# Patient Record
Sex: Female | Born: 1935 | Race: Black or African American | Hispanic: No | Marital: Married | Smoking: Never smoker
Health system: Southern US, Community
[De-identification: ages and names within clinical notes are randomized; demographics above are authoritative.]

## PROBLEM LIST (undated history)

## (undated) DIAGNOSIS — I10 Essential (primary) hypertension: Secondary | ICD-10-CM

## (undated) DIAGNOSIS — F329 Major depressive disorder, single episode, unspecified: Secondary | ICD-10-CM

## (undated) DIAGNOSIS — K5790 Diverticulosis of intestine, part unspecified, without perforation or abscess without bleeding: Secondary | ICD-10-CM

## (undated) DIAGNOSIS — N183 Chronic kidney disease, stage 3 unspecified: Secondary | ICD-10-CM

## (undated) DIAGNOSIS — F32A Depression, unspecified: Secondary | ICD-10-CM

## (undated) DIAGNOSIS — I639 Cerebral infarction, unspecified: Secondary | ICD-10-CM

## (undated) DIAGNOSIS — E119 Type 2 diabetes mellitus without complications: Secondary | ICD-10-CM

## (undated) DIAGNOSIS — M1990 Unspecified osteoarthritis, unspecified site: Secondary | ICD-10-CM

## (undated) DIAGNOSIS — K648 Other hemorrhoids: Secondary | ICD-10-CM

## (undated) DIAGNOSIS — Z86010 Personal history of colonic polyps: Secondary | ICD-10-CM

## (undated) DIAGNOSIS — D509 Iron deficiency anemia, unspecified: Secondary | ICD-10-CM

## (undated) DIAGNOSIS — I5032 Chronic diastolic (congestive) heart failure: Secondary | ICD-10-CM

## (undated) DIAGNOSIS — Z860101 Personal history of adenomatous and serrated colon polyps: Secondary | ICD-10-CM

## (undated) DIAGNOSIS — F0390 Unspecified dementia without behavioral disturbance: Secondary | ICD-10-CM

## (undated) DIAGNOSIS — E785 Hyperlipidemia, unspecified: Secondary | ICD-10-CM

## (undated) HISTORY — DX: Chronic kidney disease, stage 3 (moderate): N18.3

## (undated) HISTORY — DX: Chronic kidney disease, stage 3 unspecified: N18.30

## (undated) HISTORY — PX: VASCULAR SURGERY: SHX849

## (undated) HISTORY — DX: Personal history of colonic polyps: Z86.010

## (undated) HISTORY — PX: TONSILLECTOMY: SUR1361

## (undated) HISTORY — DX: Other hemorrhoids: K64.8

## (undated) HISTORY — DX: Personal history of adenomatous and serrated colon polyps: Z86.0101

## (undated) HISTORY — DX: Iron deficiency anemia, unspecified: D50.9

## (undated) HISTORY — PX: ABDOMINAL SURGERY: SHX537

## (undated) HISTORY — DX: Hyperlipidemia, unspecified: E78.5

## (undated) HISTORY — PX: ABDOMINAL HYSTERECTOMY: SHX81

## (undated) HISTORY — PX: COLECTOMY: SHX59

## (undated) HISTORY — PX: OTHER SURGICAL HISTORY: SHX169

## (undated) HISTORY — PX: BOWEL RESECTION: SHX1257

## (undated) HISTORY — DX: Diverticulosis of intestine, part unspecified, without perforation or abscess without bleeding: K57.90

---

## 1997-06-08 ENCOUNTER — Emergency Department (HOSPITAL_COMMUNITY): Admission: EM | Admit: 1997-06-08 | Discharge: 1997-06-08 | Payer: Self-pay | Admitting: *Deleted

## 1998-02-01 ENCOUNTER — Emergency Department (HOSPITAL_COMMUNITY): Admission: EM | Admit: 1998-02-01 | Discharge: 1998-02-01 | Payer: Self-pay | Admitting: Emergency Medicine

## 1998-07-06 ENCOUNTER — Emergency Department (HOSPITAL_COMMUNITY): Admission: EM | Admit: 1998-07-06 | Discharge: 1998-07-06 | Payer: Self-pay | Admitting: Emergency Medicine

## 1998-07-08 ENCOUNTER — Inpatient Hospital Stay (HOSPITAL_COMMUNITY): Admission: EM | Admit: 1998-07-08 | Discharge: 1998-07-12 | Payer: Self-pay | Admitting: Emergency Medicine

## 1998-07-08 ENCOUNTER — Encounter: Payer: Self-pay | Admitting: Internal Medicine

## 1998-07-09 ENCOUNTER — Encounter (INDEPENDENT_AMBULATORY_CARE_PROVIDER_SITE_OTHER): Payer: Self-pay | Admitting: Specialist

## 1999-01-21 ENCOUNTER — Encounter: Payer: Self-pay | Admitting: Emergency Medicine

## 1999-01-21 ENCOUNTER — Emergency Department (HOSPITAL_COMMUNITY): Admission: EM | Admit: 1999-01-21 | Discharge: 1999-01-21 | Payer: Self-pay | Admitting: Emergency Medicine

## 2000-03-03 ENCOUNTER — Encounter: Payer: Self-pay | Admitting: Internal Medicine

## 2000-03-03 ENCOUNTER — Inpatient Hospital Stay (HOSPITAL_COMMUNITY): Admission: EM | Admit: 2000-03-03 | Discharge: 2000-03-05 | Payer: Self-pay | Admitting: Emergency Medicine

## 2000-05-03 ENCOUNTER — Emergency Department (HOSPITAL_COMMUNITY): Admission: EM | Admit: 2000-05-03 | Discharge: 2000-05-03 | Payer: Self-pay | Admitting: Emergency Medicine

## 2000-05-08 ENCOUNTER — Encounter (INDEPENDENT_AMBULATORY_CARE_PROVIDER_SITE_OTHER): Payer: Self-pay | Admitting: Specialist

## 2000-05-08 ENCOUNTER — Other Ambulatory Visit: Admission: RE | Admit: 2000-05-08 | Discharge: 2000-05-08 | Payer: Self-pay | Admitting: Internal Medicine

## 2001-05-11 ENCOUNTER — Emergency Department (HOSPITAL_COMMUNITY): Admission: EM | Admit: 2001-05-11 | Discharge: 2001-05-11 | Payer: Self-pay | Admitting: Emergency Medicine

## 2002-06-05 ENCOUNTER — Emergency Department (HOSPITAL_COMMUNITY): Admission: EM | Admit: 2002-06-05 | Discharge: 2002-06-05 | Payer: Self-pay | Admitting: Emergency Medicine

## 2002-10-29 ENCOUNTER — Encounter: Payer: Self-pay | Admitting: Internal Medicine

## 2002-10-29 ENCOUNTER — Inpatient Hospital Stay (HOSPITAL_COMMUNITY): Admission: EM | Admit: 2002-10-29 | Discharge: 2002-10-30 | Payer: Self-pay

## 2002-10-29 ENCOUNTER — Encounter: Payer: Self-pay | Admitting: Emergency Medicine

## 2002-11-03 ENCOUNTER — Encounter: Admission: RE | Admit: 2002-11-03 | Discharge: 2002-11-03 | Payer: Self-pay | Admitting: Internal Medicine

## 2002-11-14 ENCOUNTER — Encounter: Admission: RE | Admit: 2002-11-14 | Discharge: 2002-11-14 | Payer: Self-pay | Admitting: Internal Medicine

## 2002-12-09 ENCOUNTER — Encounter: Admission: RE | Admit: 2002-12-09 | Discharge: 2002-12-09 | Payer: Self-pay | Admitting: Internal Medicine

## 2003-07-03 ENCOUNTER — Inpatient Hospital Stay (HOSPITAL_COMMUNITY): Admission: EM | Admit: 2003-07-03 | Discharge: 2003-07-04 | Payer: Self-pay | Admitting: Emergency Medicine

## 2003-07-20 ENCOUNTER — Ambulatory Visit (HOSPITAL_COMMUNITY): Admission: RE | Admit: 2003-07-20 | Discharge: 2003-07-20 | Payer: Self-pay | Admitting: Cardiology

## 2003-07-20 ENCOUNTER — Encounter (INDEPENDENT_AMBULATORY_CARE_PROVIDER_SITE_OTHER): Payer: Self-pay | Admitting: *Deleted

## 2003-10-15 ENCOUNTER — Ambulatory Visit: Payer: Self-pay | Admitting: Internal Medicine

## 2003-10-22 ENCOUNTER — Ambulatory Visit: Payer: Self-pay | Admitting: Internal Medicine

## 2003-10-25 ENCOUNTER — Inpatient Hospital Stay (HOSPITAL_COMMUNITY): Admission: EM | Admit: 2003-10-25 | Discharge: 2003-10-29 | Payer: Self-pay | Admitting: Emergency Medicine

## 2003-10-27 ENCOUNTER — Encounter: Payer: Self-pay | Admitting: Internal Medicine

## 2003-11-02 ENCOUNTER — Ambulatory Visit: Payer: Self-pay | Admitting: Internal Medicine

## 2003-11-13 ENCOUNTER — Encounter (INDEPENDENT_AMBULATORY_CARE_PROVIDER_SITE_OTHER): Payer: Self-pay | Admitting: *Deleted

## 2003-12-10 ENCOUNTER — Emergency Department (HOSPITAL_COMMUNITY): Admission: EM | Admit: 2003-12-10 | Discharge: 2003-12-10 | Payer: Self-pay | Admitting: Family Medicine

## 2003-12-12 ENCOUNTER — Emergency Department (HOSPITAL_COMMUNITY): Admission: EM | Admit: 2003-12-12 | Discharge: 2003-12-12 | Payer: Self-pay | Admitting: Family Medicine

## 2004-07-21 ENCOUNTER — Ambulatory Visit: Payer: Self-pay | Admitting: Family Medicine

## 2004-08-18 ENCOUNTER — Emergency Department (HOSPITAL_COMMUNITY): Admission: EM | Admit: 2004-08-18 | Discharge: 2004-08-18 | Payer: Self-pay | Admitting: Emergency Medicine

## 2004-11-30 ENCOUNTER — Emergency Department (HOSPITAL_COMMUNITY): Admission: EM | Admit: 2004-11-30 | Discharge: 2004-11-30 | Payer: Self-pay | Admitting: Emergency Medicine

## 2005-01-12 ENCOUNTER — Ambulatory Visit: Payer: Self-pay | Admitting: Family Medicine

## 2005-02-01 ENCOUNTER — Encounter: Admission: RE | Admit: 2005-02-01 | Discharge: 2005-02-01 | Payer: Self-pay | Admitting: Family Medicine

## 2005-03-15 ENCOUNTER — Ambulatory Visit: Payer: Self-pay | Admitting: Internal Medicine

## 2005-04-12 ENCOUNTER — Ambulatory Visit: Payer: Self-pay | Admitting: Family Medicine

## 2005-08-16 ENCOUNTER — Ambulatory Visit: Payer: Self-pay | Admitting: Family Medicine

## 2005-08-30 ENCOUNTER — Ambulatory Visit: Payer: Self-pay | Admitting: Family Medicine

## 2005-09-06 ENCOUNTER — Ambulatory Visit: Payer: Self-pay | Admitting: Family Medicine

## 2006-01-13 ENCOUNTER — Emergency Department (HOSPITAL_COMMUNITY): Admission: EM | Admit: 2006-01-13 | Discharge: 2006-01-13 | Payer: Self-pay | Admitting: Emergency Medicine

## 2006-03-27 ENCOUNTER — Ambulatory Visit: Payer: Self-pay | Admitting: Family Medicine

## 2006-04-03 ENCOUNTER — Ambulatory Visit: Payer: Self-pay | Admitting: Internal Medicine

## 2006-04-03 LAB — CONVERTED CEMR LAB
Eosinophils Absolute: 0.1 10*3/uL (ref 0.0–0.6)
Eosinophils Relative: 0.8 % (ref 0.0–5.0)
HCT: 33.4 % — ABNORMAL LOW (ref 36.0–46.0)
Hemoglobin: 11.2 g/dL — ABNORMAL LOW (ref 12.0–15.0)
MCV: 83.3 fL (ref 78.0–100.0)
Monocytes Absolute: 0.6 10*3/uL (ref 0.2–0.7)
Neutrophils Relative %: 42.9 % — ABNORMAL LOW (ref 43.0–77.0)
RBC: 4.02 M/uL (ref 3.87–5.11)
Saturation Ratios: 11.9 % — ABNORMAL LOW (ref 20.0–50.0)
WBC: 7.8 10*3/uL (ref 4.5–10.5)

## 2006-04-10 ENCOUNTER — Ambulatory Visit: Payer: Self-pay | Admitting: Internal Medicine

## 2006-04-10 LAB — CONVERTED CEMR LAB
OCCULT 1: NEGATIVE
OCCULT 3: NEGATIVE
OCCULT 4: NEGATIVE

## 2006-04-14 ENCOUNTER — Encounter (INDEPENDENT_AMBULATORY_CARE_PROVIDER_SITE_OTHER): Payer: Self-pay | Admitting: *Deleted

## 2006-04-15 ENCOUNTER — Inpatient Hospital Stay (HOSPITAL_COMMUNITY): Admission: EM | Admit: 2006-04-15 | Discharge: 2006-04-19 | Payer: Self-pay | Admitting: Emergency Medicine

## 2006-04-15 ENCOUNTER — Encounter (INDEPENDENT_AMBULATORY_CARE_PROVIDER_SITE_OTHER): Payer: Self-pay | Admitting: *Deleted

## 2006-04-19 ENCOUNTER — Encounter (INDEPENDENT_AMBULATORY_CARE_PROVIDER_SITE_OTHER): Payer: Self-pay | Admitting: *Deleted

## 2006-05-14 ENCOUNTER — Encounter: Payer: Self-pay | Admitting: Internal Medicine

## 2006-05-15 ENCOUNTER — Ambulatory Visit: Payer: Self-pay | Admitting: Internal Medicine

## 2006-06-13 ENCOUNTER — Ambulatory Visit: Payer: Self-pay | Admitting: Internal Medicine

## 2006-06-20 ENCOUNTER — Ambulatory Visit (HOSPITAL_COMMUNITY): Admission: RE | Admit: 2006-06-20 | Discharge: 2006-06-20 | Payer: Self-pay | Admitting: Internal Medicine

## 2006-10-14 ENCOUNTER — Emergency Department (HOSPITAL_COMMUNITY): Admission: EM | Admit: 2006-10-14 | Discharge: 2006-10-14 | Payer: Self-pay | Admitting: Emergency Medicine

## 2007-05-24 DIAGNOSIS — I639 Cerebral infarction, unspecified: Secondary | ICD-10-CM

## 2007-05-24 HISTORY — DX: Cerebral infarction, unspecified: I63.9

## 2007-06-01 ENCOUNTER — Inpatient Hospital Stay (HOSPITAL_COMMUNITY): Admission: EM | Admit: 2007-06-01 | Discharge: 2007-06-05 | Payer: Self-pay | Admitting: Emergency Medicine

## 2007-06-03 ENCOUNTER — Encounter (INDEPENDENT_AMBULATORY_CARE_PROVIDER_SITE_OTHER): Payer: Self-pay | Admitting: Family Medicine

## 2007-06-03 ENCOUNTER — Ambulatory Visit: Payer: Self-pay | Admitting: Vascular Surgery

## 2007-06-05 ENCOUNTER — Encounter (INDEPENDENT_AMBULATORY_CARE_PROVIDER_SITE_OTHER): Payer: Self-pay | Admitting: *Deleted

## 2007-07-17 ENCOUNTER — Encounter: Admission: RE | Admit: 2007-07-17 | Discharge: 2007-10-15 | Payer: Self-pay | Admitting: Family Medicine

## 2007-08-21 ENCOUNTER — Emergency Department (HOSPITAL_COMMUNITY): Admission: EM | Admit: 2007-08-21 | Discharge: 2007-08-21 | Payer: Self-pay | Admitting: Emergency Medicine

## 2007-08-21 ENCOUNTER — Telehealth: Payer: Self-pay | Admitting: Internal Medicine

## 2007-08-22 ENCOUNTER — Ambulatory Visit: Payer: Self-pay | Admitting: Internal Medicine

## 2007-08-23 LAB — CONVERTED CEMR LAB
Basophils Absolute: 0 10*3/uL (ref 0.0–0.1)
Eosinophils Absolute: 0.1 10*3/uL (ref 0.0–0.7)
MCHC: 34.3 g/dL (ref 30.0–36.0)
MCV: 83.2 fL (ref 78.0–100.0)
Neutrophils Relative %: 54 % (ref 43.0–77.0)
Platelets: 246 10*3/uL (ref 150–400)
WBC: 7.1 10*3/uL (ref 4.5–10.5)

## 2007-10-15 ENCOUNTER — Emergency Department (HOSPITAL_COMMUNITY): Admission: EM | Admit: 2007-10-15 | Discharge: 2007-10-15 | Payer: Self-pay | Admitting: Emergency Medicine

## 2008-01-24 HISTORY — PX: SUBTOTAL COLECTOMY: SHX855

## 2008-03-15 ENCOUNTER — Ambulatory Visit: Payer: Self-pay | Admitting: Cardiovascular Disease

## 2008-03-15 ENCOUNTER — Inpatient Hospital Stay (HOSPITAL_COMMUNITY): Admission: EM | Admit: 2008-03-15 | Discharge: 2008-03-18 | Payer: Self-pay | Admitting: Emergency Medicine

## 2008-03-16 ENCOUNTER — Encounter (INDEPENDENT_AMBULATORY_CARE_PROVIDER_SITE_OTHER): Payer: Self-pay | Admitting: Family Medicine

## 2008-03-16 ENCOUNTER — Ambulatory Visit: Payer: Self-pay | Admitting: Surgery

## 2008-03-17 ENCOUNTER — Encounter (INDEPENDENT_AMBULATORY_CARE_PROVIDER_SITE_OTHER): Payer: Self-pay | Admitting: Neurology

## 2008-06-23 ENCOUNTER — Emergency Department (HOSPITAL_COMMUNITY): Admission: EM | Admit: 2008-06-23 | Discharge: 2008-06-23 | Payer: Self-pay | Admitting: Emergency Medicine

## 2008-08-17 ENCOUNTER — Ambulatory Visit: Payer: Self-pay | Admitting: Internal Medicine

## 2008-08-17 ENCOUNTER — Telehealth: Payer: Self-pay | Admitting: Internal Medicine

## 2008-08-17 DIAGNOSIS — R7309 Other abnormal glucose: Secondary | ICD-10-CM

## 2008-08-17 DIAGNOSIS — D509 Iron deficiency anemia, unspecified: Secondary | ICD-10-CM

## 2008-08-17 DIAGNOSIS — I10 Essential (primary) hypertension: Secondary | ICD-10-CM | POA: Insufficient documentation

## 2008-08-17 DIAGNOSIS — K5732 Diverticulitis of large intestine without perforation or abscess without bleeding: Secondary | ICD-10-CM | POA: Insufficient documentation

## 2008-08-17 DIAGNOSIS — E785 Hyperlipidemia, unspecified: Secondary | ICD-10-CM | POA: Insufficient documentation

## 2008-08-17 DIAGNOSIS — Z8673 Personal history of transient ischemic attack (TIA), and cerebral infarction without residual deficits: Secondary | ICD-10-CM

## 2008-08-17 LAB — CONVERTED CEMR LAB
ALT: 9 units/L (ref 0–35)
AST: 13 units/L (ref 0–37)
Albumin: 2.6 g/dL — ABNORMAL LOW (ref 3.5–5.2)
Basophils Absolute: 0 10*3/uL (ref 0.0–0.1)
Calcium: 8.1 mg/dL — ABNORMAL LOW (ref 8.4–10.5)
Chloride: 109 meq/L (ref 96–112)
Eosinophils Absolute: 0 10*3/uL (ref 0.0–0.7)
Hemoglobin: 11.2 g/dL — ABNORMAL LOW (ref 12.0–15.0)
Lymphocytes Relative: 31.5 % (ref 12.0–46.0)
MCHC: 32.3 g/dL (ref 30.0–36.0)
Neutro Abs: 4.2 10*3/uL (ref 1.4–7.7)
Neutrophils Relative %: 60.5 % (ref 43.0–77.0)
Potassium: 4.1 meq/L (ref 3.5–5.1)
RDW: 13.7 % (ref 11.5–14.6)

## 2008-08-18 ENCOUNTER — Ambulatory Visit: Payer: Self-pay | Admitting: Internal Medicine

## 2008-08-24 ENCOUNTER — Encounter: Payer: Self-pay | Admitting: Internal Medicine

## 2008-08-25 ENCOUNTER — Encounter: Payer: Self-pay | Admitting: Internal Medicine

## 2008-08-25 DIAGNOSIS — J45909 Unspecified asthma, uncomplicated: Secondary | ICD-10-CM | POA: Insufficient documentation

## 2008-08-25 DIAGNOSIS — Z860100 Personal history of colon polyps, unspecified: Secondary | ICD-10-CM | POA: Insufficient documentation

## 2008-09-07 ENCOUNTER — Ambulatory Visit: Payer: Self-pay | Admitting: Internal Medicine

## 2008-09-17 ENCOUNTER — Encounter: Payer: Self-pay | Admitting: Internal Medicine

## 2008-10-22 ENCOUNTER — Encounter (INDEPENDENT_AMBULATORY_CARE_PROVIDER_SITE_OTHER): Payer: Self-pay | Admitting: General Surgery

## 2008-10-22 ENCOUNTER — Inpatient Hospital Stay (HOSPITAL_COMMUNITY): Admission: RE | Admit: 2008-10-22 | Discharge: 2008-10-28 | Payer: Self-pay | Admitting: General Surgery

## 2008-11-11 ENCOUNTER — Encounter: Payer: Self-pay | Admitting: Internal Medicine

## 2008-11-22 ENCOUNTER — Ambulatory Visit: Payer: Self-pay | Admitting: Internal Medicine

## 2008-11-22 ENCOUNTER — Inpatient Hospital Stay (HOSPITAL_COMMUNITY): Admission: EM | Admit: 2008-11-22 | Discharge: 2008-11-27 | Payer: Self-pay | Admitting: Emergency Medicine

## 2008-11-23 ENCOUNTER — Ambulatory Visit: Payer: Self-pay | Admitting: Surgery

## 2008-11-23 ENCOUNTER — Encounter (INDEPENDENT_AMBULATORY_CARE_PROVIDER_SITE_OTHER): Payer: Self-pay | Admitting: Internal Medicine

## 2008-11-26 ENCOUNTER — Encounter: Payer: Self-pay | Admitting: Internal Medicine

## 2008-11-27 ENCOUNTER — Encounter (INDEPENDENT_AMBULATORY_CARE_PROVIDER_SITE_OTHER): Payer: Self-pay | Admitting: *Deleted

## 2008-12-03 ENCOUNTER — Encounter: Payer: Self-pay | Admitting: Internal Medicine

## 2009-02-04 ENCOUNTER — Encounter: Payer: Self-pay | Admitting: Internal Medicine

## 2009-06-11 ENCOUNTER — Encounter (INDEPENDENT_AMBULATORY_CARE_PROVIDER_SITE_OTHER): Payer: Self-pay | Admitting: *Deleted

## 2009-06-11 ENCOUNTER — Emergency Department (HOSPITAL_COMMUNITY): Admission: EM | Admit: 2009-06-11 | Discharge: 2009-06-11 | Payer: Self-pay | Admitting: Emergency Medicine

## 2009-09-05 ENCOUNTER — Emergency Department (HOSPITAL_COMMUNITY): Admission: EM | Admit: 2009-09-05 | Discharge: 2009-09-05 | Payer: Self-pay | Admitting: Emergency Medicine

## 2009-09-05 ENCOUNTER — Encounter (INDEPENDENT_AMBULATORY_CARE_PROVIDER_SITE_OTHER): Payer: Self-pay | Admitting: *Deleted

## 2009-10-05 ENCOUNTER — Emergency Department (HOSPITAL_COMMUNITY): Admission: EM | Admit: 2009-10-05 | Discharge: 2009-10-05 | Payer: Self-pay | Admitting: Emergency Medicine

## 2009-10-05 ENCOUNTER — Encounter (INDEPENDENT_AMBULATORY_CARE_PROVIDER_SITE_OTHER): Payer: Self-pay | Admitting: *Deleted

## 2010-01-06 ENCOUNTER — Ambulatory Visit: Payer: Self-pay | Admitting: Internal Medicine

## 2010-01-17 ENCOUNTER — Emergency Department (HOSPITAL_COMMUNITY)
Admission: EM | Admit: 2010-01-17 | Discharge: 2010-01-17 | Payer: Self-pay | Source: Home / Self Care | Admitting: Emergency Medicine

## 2010-01-19 ENCOUNTER — Emergency Department (HOSPITAL_COMMUNITY)
Admission: EM | Admit: 2010-01-19 | Discharge: 2010-01-19 | Payer: Self-pay | Source: Home / Self Care | Admitting: Emergency Medicine

## 2010-02-22 NOTE — Letter (Signed)
Summary: [facility]   Imported By: Lester Tucker 03/08/2009 09:25:39  _____________________________________________________________________  External Attachment:    Type:   Image     Comment:   External Document

## 2010-02-24 NOTE — Consult Note (Signed)
Summary: Pandiverticulosis  NAMEJOURNIE, HOWSON              ACCOUNT NO.:  [id number]      MEDICAL RECORD NO.:  [id number]          PATIENT TYPE:  INP      LOCATION:  4739                         FACILITY:  [facility]      PHYSICIAN:  Michael L. Reynolds, M.D.DATE OF BIRTH:  07-12-1935      DATE OF CONSULTATION:  11/27/2008   DATE OF DISCHARGE:  11/27/2008                                    CONSULTATION      HISTORY OF PRESENT ILLNESS:  This is a 75 year old with past medical   history significant for hypertension, pandiverticulitis, status post   total colectomy on October 22, 2008, history of CVA in 2010, with CVA   that presented with a slurred speech, a prior history of TIA in 2009,   history of hypertension who presented on November 22, 2008, to the   emergency department after a near syncope event.  The patient relayed   that she was going to stand in her dining room when she developed   lightheadedness and accompanied by dyspnea.  The patient was found to   have a pulmonary embolism bilateral.  The patient also had a neuro   workup for her near syncope event with the result as below.  During this   interview, the patient relayed feeling much better.  Denies any   lightheadedness.  No new neuro deficit.  She has some dysarthria and   slurred speech that is pretty much the same.  She is slightly aphasic   also.  The patient denied any prior history of DVT or pulmonary   embolism, no family history of prior clot.  Her shortness of breath has   improved.      PAST MEDICAL HISTORY:  Hypertension   Pandiverticulosis with the history of recurrent diverticulitis   History of hyperlipidemia   History of asthma   CVA in 2010, TIA in 2009   History of cerebral artery occlusion   History of anemia.      PAST SURGICAL HISTORY:  Total abdominal colectomy on October 22, 2008,   history of TAH/BSO, and right ear mastoidectomy.      OUTPATIENT MEDICATION:  Plavix   Cardizem.      INPATIENT MEDICATION:  Norvasc, ceftriaxone, Plavix, diltiazem,   fondaparinux, iodine, warfarin, and Tylenol.      ALLERGIES:  Prior history of allergy to ASPIRIN.      FAMILY HISTORY:  Paternal side has history of CVA, diabetes, coronary   artery disease.  Her father died of a stroke.  No prior history of clot   on her family.      SOCIAL HISTORY:  She is single, she has 2 daughters, 2 son.  She denies   tobacco, alcohol, or illicit drugs.      REVIEW OF SYSTEMS:  All review of systems are negative except as per   HPI.      PHYSICAL EXAMINATION:  VITAL SIGNS:  Temperature 97.9, blood pressure   144/64, heart rate 76, respirations 20, and sat 100% on room air.   HEENT:  Head, atraumatic and normocephalic.  No dentition.  Right eye   with mild proptosis.  Uvula central.   LUNGS:  Clear to auscultation.  No wheezing, no rales, no rhonchi.   ABDOMEN:  Bowel sounds positive.  Soft.  Incision in midline abdomen   with a small opening; mild drainage of blood, very mild.  No sign of   infection.   NECK:  Supple.  No carotid bruits.  No thyromegaly.   CARDIOVASCULAR:  S1 and S2.  Normal regular rhythm and rate.  Systolic   murmur best heard at the second intercostal space.   SKIN:  Dry skin.  No rashes.   NEURO:  The patient alert and oriented x3.  Uvula central.  No facial   droop, aphasic, mild dysarthric also.  Cerebellar, mild  imbalanced   gait.  Normal finger-to-nose.  Motor strength 5/5 throughout.  Equal   sensation of the left side of the face, other point of sensation intact.      LABORATORY DATA:  From November 25, 2008, sodium 137, potassium 3.6,   chloride 110, bicarb 22, BUN 9, creatinine 1.28, and glucose 83.  White   blood cell 4.6, hemoglobin 9.5, hematocrit 28.4, and platelet 320.  PT   19.3, INR 1.64, albumin 2.5, calcium 8.5, AST 63, and ALT 14.  D-dimer   2.6.  EKG, sinus rhythm, first-degree block.  Chest x-ray, no apply.      IMAGINING:  MRA, February 2010, showed  75% to 90% stenosis in diagonal   segment, RICA.      Dopplers performed during this admission, no ICA stenosis.  Vertebral   artery flow is antegrade.      CT head, no evidence of acute intracranial abnormality.      CT chest, bilateral pulmonary embolism.      MRI, multiple chronic cortical infarct.      TE performed during this admission, tiny PFO, no evidence of thrombus.      ASSESSMENT AND PLAN:  This is a 75 year old with multiple risk factor   for a stroke, hypertension, hyperlipidemia, and peripheral vascular   disease.  All these risk factors explain her multiple cortical strokes.   She was found to have a very small tiny PFO during this admission.  It   is unlikely that this is a significant factor that is contributing to   her stroke. We will follow her as an outpatient and perform TCD bubble   study as an outpatient to determine if there is any significance of the   PFO.  She should be on a statin if there is no contraindication.  I   would stop the Plavix if she is on Plavix for a stroke prevention.  I   will continue with Coumadin at this point for both for her pulmonary   embolism and a stroke prevention, and we will reevaluate and assess the   need of Coumadin for a stroke prevention as an outpatient.               Hartley Barefoot, MD   Electronically Signed               Marolyn Hammock. Thad Ranger, M.D.   Electronically Signed         BR/MEDQ  D:  11/27/2008  T:  11/27/2008  Job:  [id number]

## 2010-04-07 LAB — COMPREHENSIVE METABOLIC PANEL
ALT: 12 U/L (ref 0–35)
BUN: 18 mg/dL (ref 6–23)
Calcium: 8.7 mg/dL (ref 8.4–10.5)
Creatinine, Ser: 1.45 mg/dL — ABNORMAL HIGH (ref 0.4–1.2)
Glucose, Bld: 106 mg/dL — ABNORMAL HIGH (ref 70–99)
Sodium: 139 mEq/L (ref 135–145)
Total Protein: 6.7 g/dL (ref 6.0–8.3)

## 2010-04-07 LAB — CBC
Hemoglobin: 12.1 g/dL (ref 12.0–15.0)
MCHC: 32.5 g/dL (ref 30.0–36.0)
Platelets: 165 10*3/uL (ref 150–400)

## 2010-04-07 LAB — CK TOTAL AND CKMB (NOT AT ARMC)
Relative Index: INVALID (ref 0.0–2.5)
Total CK: 81 U/L (ref 7–177)

## 2010-04-07 LAB — DIFFERENTIAL
Lymphocytes Relative: 35 % (ref 12–46)
Lymphs Abs: 1.3 10*3/uL (ref 0.7–4.0)
Monocytes Relative: 7 % (ref 3–12)
Neutro Abs: 2.2 10*3/uL (ref 1.7–7.7)
Neutrophils Relative %: 57 % (ref 43–77)

## 2010-04-07 LAB — TROPONIN I: Troponin I: 0.01 ng/mL (ref 0.00–0.06)

## 2010-04-08 LAB — URINALYSIS, ROUTINE W REFLEX MICROSCOPIC
Bilirubin Urine: NEGATIVE
Ketones, ur: NEGATIVE mg/dL
Nitrite: NEGATIVE
Specific Gravity, Urine: 1.023 (ref 1.005–1.030)
pH: 5.5 (ref 5.0–8.0)

## 2010-04-08 LAB — URINE MICROSCOPIC-ADD ON

## 2010-04-08 LAB — POCT I-STAT, CHEM 8
Calcium, Ion: 1.14 mmol/L (ref 1.12–1.32)
Chloride: 108 mEq/L (ref 96–112)
Creatinine, Ser: 1.4 mg/dL — ABNORMAL HIGH (ref 0.4–1.2)
Glucose, Bld: 108 mg/dL — ABNORMAL HIGH (ref 70–99)
HCT: 38 % (ref 36.0–46.0)

## 2010-04-08 LAB — DIFFERENTIAL
Eosinophils Relative: 1 % (ref 0–5)
Lymphocytes Relative: 40 % (ref 12–46)
Lymphs Abs: 1.7 10*3/uL (ref 0.7–4.0)
Monocytes Absolute: 0.4 10*3/uL (ref 0.1–1.0)
Neutro Abs: 2.2 10*3/uL (ref 1.7–7.7)

## 2010-04-08 LAB — CBC
HCT: 37.1 % (ref 36.0–46.0)
Hemoglobin: 12 g/dL (ref 12.0–15.0)
MCV: 85.5 fL (ref 78.0–100.0)
RBC: 4.34 MIL/uL (ref 3.87–5.11)
WBC: 4.3 10*3/uL (ref 4.0–10.5)

## 2010-04-08 LAB — HEPATIC FUNCTION PANEL
ALT: 13 U/L (ref 0–35)
AST: 19 U/L (ref 0–37)
Alkaline Phosphatase: 62 U/L (ref 39–117)
Bilirubin, Direct: <0.1 mg/dL (ref 0.0–0.3)

## 2010-04-11 LAB — URINALYSIS, ROUTINE W REFLEX MICROSCOPIC
Bilirubin Urine: NEGATIVE
Nitrite: NEGATIVE
Specific Gravity, Urine: 1.021 (ref 1.005–1.030)
Urobilinogen, UA: 0.2 mg/dL (ref 0.0–1.0)
pH: 5.5 (ref 5.0–8.0)

## 2010-04-11 LAB — URINE CULTURE

## 2010-04-11 LAB — PROTIME-INR: Prothrombin Time: 13.6 seconds (ref 11.6–15.2)

## 2010-04-11 LAB — COMPREHENSIVE METABOLIC PANEL
ALT: 11 U/L (ref 0–35)
CO2: 28 mEq/L (ref 19–32)
Calcium: 8.5 mg/dL (ref 8.4–10.5)
Creatinine, Ser: 1.36 mg/dL — ABNORMAL HIGH (ref 0.4–1.2)
GFR calc non Af Amer: 38 mL/min — ABNORMAL LOW (ref >60)
Glucose, Bld: 89 mg/dL (ref 70–99)

## 2010-04-11 LAB — CBC
HCT: 37.4 % (ref 36.0–46.0)
MCHC: 32.2 g/dL (ref 30.0–36.0)
MCV: 87.3 fL (ref 78.0–100.0)
Platelets: 177 10*3/uL (ref 150–400)
RDW: 14.3 % (ref 11.5–15.5)
WBC: 4 10*3/uL (ref 4.0–10.5)

## 2010-04-11 LAB — DIFFERENTIAL
Eosinophils Absolute: 0 10*3/uL (ref 0.0–0.7)
Lymphocytes Relative: 36 % (ref 12–46)
Lymphs Abs: 1.4 10*3/uL (ref 0.7–4.0)
Neutrophils Relative %: 54 % (ref 43–77)

## 2010-04-11 LAB — LIPASE, BLOOD: Lipase: 24 U/L (ref 11–59)

## 2010-04-27 LAB — GLUCOSE, CAPILLARY
Glucose-Capillary: 76 mg/dL (ref 70–99)
Glucose-Capillary: 81 mg/dL (ref 70–99)

## 2010-04-27 LAB — COMPREHENSIVE METABOLIC PANEL
ALT: 9 U/L (ref 0–35)
Alkaline Phosphatase: 59 U/L (ref 39–117)
BUN: 9 mg/dL (ref 6–23)
BUN: 9 mg/dL (ref 6–23)
CO2: 22 mEq/L (ref 19–32)
CO2: 24 mEq/L (ref 19–32)
Calcium: 8.2 mg/dL — ABNORMAL LOW (ref 8.4–10.5)
Chloride: 111 mEq/L (ref 96–112)
Creatinine, Ser: 1.18 mg/dL (ref 0.4–1.2)
Creatinine, Ser: 1.28 mg/dL — ABNORMAL HIGH (ref 0.4–1.2)
GFR calc non Af Amer: 41 mL/min — ABNORMAL LOW (ref >60)
GFR calc non Af Amer: 45 mL/min — ABNORMAL LOW (ref >60)
Glucose, Bld: 83 mg/dL (ref 70–99)
Glucose, Bld: 85 mg/dL (ref 70–99)
Sodium: 137 mEq/L (ref 135–145)
Total Bilirubin: 0.3 mg/dL (ref 0.3–1.2)
Total Protein: 5.9 g/dL — ABNORMAL LOW (ref 6.0–8.3)

## 2010-04-27 LAB — PROTIME-INR
INR: 1.29 (ref 0.00–1.49)
INR: 1.29 (ref 0.00–1.49)
INR: 1.64 — ABNORMAL HIGH (ref 0.00–1.49)
Prothrombin Time: 16 seconds — ABNORMAL HIGH (ref 11.6–15.2)
Prothrombin Time: 16.9 seconds — ABNORMAL HIGH (ref 11.6–15.2)

## 2010-04-27 LAB — PROTEIN ELECTROPH W RFLX QUANT IMMUNOGLOBULINS
Alpha-1-Globulin: 6.1 % — ABNORMAL HIGH (ref 2.9–4.9)
Beta 2: 5.5 % (ref 3.2–6.5)
Beta Globulin: 6 % (ref 4.7–7.2)
Gamma Globulin: 22.2 % — ABNORMAL HIGH (ref 11.1–18.8)

## 2010-04-27 LAB — CBC
HCT: 27.4 % — ABNORMAL LOW (ref 36.0–46.0)
HCT: 28.4 % — ABNORMAL LOW (ref 36.0–46.0)
Hemoglobin: 8.7 g/dL — ABNORMAL LOW (ref 12.0–15.0)
Hemoglobin: 9.1 g/dL — ABNORMAL LOW (ref 12.0–15.0)
Hemoglobin: 9.5 g/dL — ABNORMAL LOW (ref 12.0–15.0)
MCHC: 33.5 g/dL (ref 30.0–36.0)
MCHC: 33.6 g/dL (ref 30.0–36.0)
MCV: 82.4 fL (ref 78.0–100.0)
MCV: 82.8 fL (ref 78.0–100.0)
Platelets: 270 10*3/uL (ref 150–400)
RBC: 3.31 MIL/uL — ABNORMAL LOW (ref 3.87–5.11)
RBC: 3.44 MIL/uL — ABNORMAL LOW (ref 3.87–5.11)
RDW: 15.1 % (ref 11.5–15.5)
RDW: 15.4 % (ref 11.5–15.5)
WBC: 4.6 10*3/uL (ref 4.0–10.5)

## 2010-04-27 LAB — URINALYSIS, ROUTINE W REFLEX MICROSCOPIC
Bilirubin Urine: NEGATIVE
Glucose, UA: NEGATIVE mg/dL
Hgb urine dipstick: NEGATIVE
Ketones, ur: NEGATIVE mg/dL
Specific Gravity, Urine: 1.012 (ref 1.005–1.030)
pH: 6 (ref 5.0–8.0)

## 2010-04-27 LAB — DIFFERENTIAL
Basophils Absolute: 0 10*3/uL (ref 0.0–0.1)
Basophils Relative: 1 % (ref 0–1)
Lymphocytes Relative: 41 % (ref 12–46)
Neutro Abs: 2 10*3/uL (ref 1.7–7.7)
Neutrophils Relative %: 44 % (ref 43–77)

## 2010-04-27 LAB — CARDIAC PANEL(CRET KIN+CKTOT+MB+TROPI)
CK, MB: 0.7 ng/mL (ref 0.3–4.0)
Total CK: 27 U/L (ref 7–177)
Troponin I: 0.01 ng/mL (ref 0.00–0.06)
Troponin I: 0.01 ng/mL (ref 0.00–0.06)

## 2010-04-27 LAB — BASIC METABOLIC PANEL
CO2: 23 mEq/L (ref 19–32)
Calcium: 7.9 mg/dL — ABNORMAL LOW (ref 8.4–10.5)
Creatinine, Ser: 1.3 mg/dL — ABNORMAL HIGH (ref 0.4–1.2)
GFR calc Af Amer: 49 mL/min — ABNORMAL LOW (ref >60)
GFR calc non Af Amer: 40 mL/min — ABNORMAL LOW (ref >60)
Glucose, Bld: 82 mg/dL (ref 70–99)
Sodium: 138 mEq/L (ref 135–145)

## 2010-04-27 LAB — METHYLMALONIC ACID, SERUM: Methylmalonic Acid, Quantitative: 259 nmol/L (ref 87–318)

## 2010-04-27 LAB — T4, FREE: Free T4: 0.98 ng/dL (ref 0.80–1.80)

## 2010-04-27 LAB — HEPARIN LEVEL (UNFRACTIONATED): Heparin Unfractionated: 0.56 IU/mL (ref 0.30–0.70)

## 2010-04-27 LAB — D-DIMER, QUANTITATIVE: D-Dimer, Quant: 2.6 ug/mL-FEU — ABNORMAL HIGH (ref 0.00–0.48)

## 2010-04-28 LAB — IRON AND TIBC
Iron: 33 ug/dL — ABNORMAL LOW (ref 42–135)
UIBC: 222 ug/dL

## 2010-04-28 LAB — UIFE/LIGHT CHAINS/TP QN, 24-HR UR
Beta, Urine: DETECTED — AB
Free Lambda Lt Chains,Ur: 1.61 mg/dL — ABNORMAL HIGH (ref 0.08–1.01)
Total Protein, Urine: 17.4 mg/dL

## 2010-04-28 LAB — URINALYSIS, ROUTINE W REFLEX MICROSCOPIC
Bilirubin Urine: NEGATIVE
Ketones, ur: NEGATIVE mg/dL
Nitrite: POSITIVE — AB
Protein, ur: NEGATIVE mg/dL
Urobilinogen, UA: 0.2 mg/dL (ref 0.0–1.0)

## 2010-04-28 LAB — BASIC METABOLIC PANEL WITH GFR
BUN: 11 mg/dL (ref 6–23)
CO2: 20 meq/L (ref 19–32)
Calcium: 7.8 mg/dL — ABNORMAL LOW (ref 8.4–10.5)
Chloride: 110 meq/L (ref 96–112)
Creatinine, Ser: 1.47 mg/dL — ABNORMAL HIGH (ref 0.4–1.2)
GFR calc non Af Amer: 35 mL/min — ABNORMAL LOW
Glucose, Bld: 127 mg/dL — ABNORMAL HIGH (ref 70–99)
Potassium: 3.6 meq/L (ref 3.5–5.1)
Sodium: 136 meq/L (ref 135–145)

## 2010-04-28 LAB — BASIC METABOLIC PANEL
BUN: 10 mg/dL (ref 6–23)
BUN: 11 mg/dL (ref 6–23)
BUN: 12 mg/dL (ref 6–23)
CO2: 22 mEq/L (ref 19–32)
Calcium: 7.9 mg/dL — ABNORMAL LOW (ref 8.4–10.5)
Chloride: 113 mEq/L — ABNORMAL HIGH (ref 96–112)
Chloride: 103 mEq/L (ref 96–112)
Chloride: 106 mEq/L (ref 96–112)
Creatinine, Ser: 1.33 mg/dL — ABNORMAL HIGH (ref 0.4–1.2)
Creatinine, Ser: 1.42 mg/dL — ABNORMAL HIGH (ref 0.4–1.2)
Creatinine, Ser: 1.46 mg/dL — ABNORMAL HIGH (ref 0.4–1.2)
GFR calc Af Amer: 48 mL/min — ABNORMAL LOW (ref >60)
GFR calc Af Amer: 42 mL/min — ABNORMAL LOW (ref >60)
GFR calc non Af Amer: 35 mL/min — ABNORMAL LOW (ref >60)
Glucose, Bld: 138 mg/dL — ABNORMAL HIGH (ref 70–99)
Glucose, Bld: 96 mg/dL (ref 70–99)
Potassium: 4 mEq/L (ref 3.5–5.1)
Potassium: 4.9 mEq/L (ref 3.5–5.1)
Sodium: 138 mEq/L (ref 135–145)
Sodium: 131 mEq/L — ABNORMAL LOW (ref 135–145)

## 2010-04-28 LAB — CBC
HCT: 37.2 % (ref 36.0–46.0)
Hemoglobin: 9 g/dL — ABNORMAL LOW (ref 12.0–15.0)
Hemoglobin: 12.2 g/dL (ref 12.0–15.0)
MCV: 83.2 fL (ref 78.0–100.0)
MCV: 83.3 fL (ref 78.0–100.0)
RBC: 3.27 MIL/uL — ABNORMAL LOW (ref 3.87–5.11)
RBC: 4.46 MIL/uL (ref 3.87–5.11)
WBC: 4.8 10*3/uL (ref 4.0–10.5)
WBC: 13.3 10*3/uL — ABNORMAL HIGH (ref 4.0–10.5)

## 2010-04-28 LAB — DIFFERENTIAL
Lymphs Abs: 1.2 10*3/uL (ref 0.7–4.0)
Monocytes Relative: 7 % (ref 3–12)
Neutro Abs: 3.1 10*3/uL (ref 1.7–7.7)
Neutrophils Relative %: 66 % (ref 43–77)

## 2010-04-28 LAB — GLUCOSE, CAPILLARY: Glucose-Capillary: 110 mg/dL — ABNORMAL HIGH (ref 70–99)

## 2010-04-28 LAB — CK TOTAL AND CKMB (NOT AT ARMC): CK, MB: 0.9 ng/mL (ref 0.3–4.0)

## 2010-04-28 LAB — POCT CARDIAC MARKERS
CKMB, poc: <1 ng/mL — ABNORMAL LOW (ref 1.0–8.0)
Myoglobin, poc: 77.5 ng/mL (ref 12–200)

## 2010-04-28 LAB — VITAMIN B12: Vitamin B-12: 252 pg/mL (ref 211–911)

## 2010-04-28 LAB — HEMOCCULT GUIAC POC 1CARD (OFFICE): Fecal Occult Bld: NEGATIVE

## 2010-04-28 LAB — TYPE AND SCREEN: ABO/RH(D): A POS

## 2010-04-28 LAB — TSH: TSH: 0.465 u[IU]/mL (ref 0.350–4.500)

## 2010-04-29 LAB — COMPREHENSIVE METABOLIC PANEL
AST: 16 U/L (ref 0–37)
Albumin: 3.3 g/dL — ABNORMAL LOW (ref 3.5–5.2)
BUN: 20 mg/dL (ref 6–23)
Chloride: 108 mEq/L (ref 96–112)
Creatinine, Ser: 1.52 mg/dL — ABNORMAL HIGH (ref 0.4–1.2)
GFR calc Af Amer: 41 mL/min — ABNORMAL LOW (ref >60)
Potassium: 4.4 mEq/L (ref 3.5–5.1)
Total Protein: 7.6 g/dL (ref 6.0–8.3)

## 2010-04-29 LAB — CBC
HCT: 38.3 % (ref 36.0–46.0)
MCV: 83.6 fL (ref 78.0–100.0)
Platelets: 279 10*3/uL (ref 150–400)
RDW: 14.7 % (ref 11.5–15.5)
WBC: 6.4 10*3/uL (ref 4.0–10.5)

## 2010-04-29 LAB — URINALYSIS, ROUTINE W REFLEX MICROSCOPIC
Glucose, UA: NEGATIVE mg/dL
Hgb urine dipstick: NEGATIVE
Specific Gravity, Urine: 1.008 (ref 1.005–1.030)
pH: 6.5 (ref 5.0–8.0)

## 2010-04-29 LAB — DIFFERENTIAL
Eosinophils Relative: 1 % (ref 0–5)
Lymphocytes Relative: 36 % (ref 12–46)
Monocytes Absolute: 0.4 10*3/uL (ref 0.1–1.0)
Monocytes Relative: 7 % (ref 3–12)
Neutro Abs: 3.6 10*3/uL (ref 1.7–7.7)

## 2010-04-29 LAB — CROSSMATCH
ABO/RH(D): A POS
Antibody Screen: NEGATIVE

## 2010-04-29 LAB — ABO/RH: ABO/RH(D): A POS

## 2010-05-02 LAB — BRAIN NATRIURETIC PEPTIDE: Pro B Natriuretic peptide (BNP): 121 pg/mL — ABNORMAL HIGH (ref 0.0–100.0)

## 2010-05-02 LAB — BASIC METABOLIC PANEL
BUN: 17 mg/dL (ref 6–23)
CO2: 27 mEq/L (ref 19–32)
Calcium: 8.5 mg/dL (ref 8.4–10.5)
Chloride: 111 mEq/L (ref 96–112)
Creatinine, Ser: 1.26 mg/dL — ABNORMAL HIGH (ref 0.4–1.2)
GFR calc Af Amer: 51 mL/min — ABNORMAL LOW (ref >60)
GFR calc non Af Amer: 42 mL/min — ABNORMAL LOW (ref >60)
Glucose, Bld: 99 mg/dL (ref 70–99)
Potassium: 3.1 mEq/L — ABNORMAL LOW (ref 3.5–5.1)
Sodium: 143 mEq/L (ref 135–145)

## 2010-05-02 LAB — DIFFERENTIAL
Basophils Absolute: 0 10*3/uL (ref 0.0–0.1)
Eosinophils Relative: 1 % (ref 0–5)
Lymphocytes Relative: 24 % (ref 12–46)
Neutrophils Relative %: 64 % (ref 43–77)

## 2010-05-02 LAB — CBC
HCT: 33.4 % — ABNORMAL LOW (ref 36.0–46.0)
Platelets: 288 10*3/uL (ref 150–400)
RDW: 14.8 % (ref 11.5–15.5)

## 2010-05-10 LAB — DIFFERENTIAL
Basophils Absolute: 0 10*3/uL (ref 0.0–0.1)
Basophils Relative: 0 % (ref 0–1)
Eosinophils Absolute: 0.1 10*3/uL (ref 0.0–0.7)
Eosinophils Relative: 1 % (ref 0–5)
Monocytes Absolute: 0.6 10*3/uL (ref 0.1–1.0)

## 2010-05-10 LAB — LIPID PANEL
Total CHOL/HDL Ratio: 4.4 RATIO
VLDL: 21 mg/dL (ref 0–40)

## 2010-05-10 LAB — COMPREHENSIVE METABOLIC PANEL
AST: 16 U/L (ref 0–37)
Albumin: 2.8 g/dL — ABNORMAL LOW (ref 3.5–5.2)
Alkaline Phosphatase: 86 U/L (ref 39–117)
Chloride: 106 mEq/L (ref 96–112)
GFR calc Af Amer: 29 mL/min — ABNORMAL LOW (ref >60)
Potassium: 3.9 mEq/L (ref 3.5–5.1)
Total Bilirubin: <0.1 mg/dL — ABNORMAL LOW (ref 0.3–1.2)

## 2010-05-10 LAB — CBC
HCT: 34.5 % — ABNORMAL LOW (ref 36.0–46.0)
Platelets: 295 10*3/uL (ref 150–400)
WBC: 7.2 10*3/uL (ref 4.0–10.5)

## 2010-06-07 NOTE — Discharge Summary (Signed)
[name], [name]              ACCOUNT NO.:  [id number]   MEDICAL RECORD NO.:  [id number]          PATIENT TYPE:  INP   LOCATION:  3030                         FACILITY:  [facility]   PHYSICIAN:  Betti D. Pecola Leisure, M.D.   DATE OF BIRTH:  08-17-1935   DATE OF ADMISSION:  06/01/2007  DATE OF DISCHARGE:  06/05/2007                               DISCHARGE SUMMARY   ADMISSION DIAGNOSIS:  Transient ischemic attack.   DISCHARGE DIAGNOSIS:  1. Transient ischemic attack.  2. Hypertension.   This patient is a 75 year old African American female who presented to  emergency department on Jun 01, 2007, with slurred speech.  She had  had  slurred speech since 10:30 a.m. on the day of admission.  She was found  to have this impaired speech when trying to have a verbal exchange with  her grandson.  Grandson denied any appearance of an abnormal gait or  facial expressions with this patient.  She otherwise seemed to be  normal.  Her labs were normal with the exception of potassium on  admission of 3.2 and a creatinine of 1.8.  Further diagnostic workup  include a CT of her head which revealed no focal bleed.  She did have  multiple old infarcts.  MRI of her brain on subsequent day was normal.  Swallow evaluation was essentially normal, but she was placed on  precautions for aspiration by giving diet of liquids, and she was  advanced to a regular diet.  A 2-D echo showed 55% ejection fraction and  a carotid Doppler showed bilateral mixed plaques of the carotid  arteries.  No right ICA or left ICA stenosis.  The patient vitals were  stable at the time of admission and remained stable throughout her  hospitalization.  This admission was unremarkable, and then the patient  regained a partial intelligible speech without any progression of stroke-  like symptoms.  Gait continued to be normal.  The patient continued to  be alert.  She remained stable throughout her hospitalization.  The  patient was discharged  home on Jun 05, 2007, in stable condition.   DISCHARGE INSTRUCTIONS:  Follow up with Dr. Pecola Leisure on Jun 07, 2007.  She  will continue her home medicines, and she was placed on Plavix 75 mg 1  tablet daily.           ______________________________  Isidoro Donning Pecola Leisure, M.D.     BDR/MEDQ  D:  09/13/2007  T:  09/14/2007  Job:  [id number]

## 2010-06-07 NOTE — Consult Note (Signed)
[name], [name]              ACCOUNT NO.:  [id number]   MEDICAL RECORD NO.:  [id number]          PATIENT TYPE:  INP   LOCATION:  3732                         FACILITY:  [facility]   PHYSICIAN:  Noralyn Pick. Eden Emms, MD, FACCDATE OF BIRTH:  18-Sep-1935   DATE OF CONSULTATION:  DATE OF DISCHARGE:                                 CONSULTATION   TRANSESOPHAGEAL ECHOCARDIOGRAM   INDICATIONS:  A 75 year old with recurrent TIA and CVA.   The patient was sedated with 25 mcg of fentanyl and 4 mg of Versed.  Using digital technique, an Omniplane probe was advanced in the distal  esophagus without incident.  The left ventricular cavity size and  function were normal.  There was moderate left ventricular hypertrophy,  EF was 60%, mitral valve was mildly redundant without significant MR.  There was mild left atrial enlargement.  Right-sided cardiac chambers  were normal.  The atrial septum was intact by 2D and color flow.  Bubble  study was negative for right-to-left shunt.  Left atrial appendage had  no thrombus or spontaneous contrast.  Aortic valve was trileaflet with  trivial aortic insufficiency.  Imaging of the aorta showed no  significant debris.   FINAL IMPRESSION:  1. No source of embolus.  2. Negative bubble study for right-to-left shunt.  3. Normal left ventricular function with moderate left ventricular      hypertrophy.  4. Trivial aortic insufficiency.  5. Normal mitral valve.  6. No left atrial appendage clot.  7. No aortic debris.   The patient tolerated the procedure well.      Noralyn Pick. Eden Emms, MD, [facility]  Electronically Signed     PCN/MEDQ  D:  03/17/2008  T:  03/18/2008  Job:  [id number]

## 2010-06-07 NOTE — Consult Note (Signed)
[name], [name]              ACCOUNT NO.:  [id number]   MEDICAL RECORD NO.:  [id number]          PATIENT TYPE:  INP   LOCATION:  3732                         FACILITY:  [facility]   PHYSICIAN:  Noel Christmas, MD    DATE OF BIRTH:  [birth year]   DATE OF CONSULTATION:  DATE OF DISCHARGE:                                 CONSULTATION   REFERRING PHYSICIAN:  Elliot L. Effie Shy, MD   REASON FOR CONSULTATION:  Acute recurrent stroke.   HISTORY:  This is a 75 year old lady with a history of previous cerebral  infarction involving the right hemisphere in May 2009 as well as TIA in  August 2009, presenting with new onset weakness and numbness involving a  right upper extremity, which started yesterday.  She had no speech  changes.  She has residual expressive aphasia from previous stroke,  which has remained unchanged.  Her gait has been unchanged and she has  not experienced any sensory or motor symptoms involving her right lower  extremity.  CT scan of her head showed an acute left frontoparietal  cortical infarction.  Plavix was recommended when she was discharged  following TIA in August 2009.  The patient, however, has been on no  antiplatelet therapy.   PAST MEDICAL HISTORY:  Remarkable for  1. Hypertension.  2. Hyperlipidemia.  3. Old cerebral infarction.  4. TIA.  5. Diverticulosis.  6. Asthma.  7. Allergies.   CURRENT MEDICATIONS:  1. Tekturna.  2. Hydrochlorothiazide 300/25 one per day.  3. Fexofenadine 180 mg per day.   FAMILY HISTORY:  Positive for stroke involving her paternal aunt.  Family history is also positive for hypertension.   PHYSICAL EXAMINATION:  GENERAL:  Appearance was that of slender,  elderly lady who was alert and cooperative and in no acute distress.  She was oriented to time as well as place.  Affect was appropriate.  She  followed commands well.  HEENT:  Pupils were equal and reactive normally to light.  Extraocular  movements were full and  conjugate.  Visual fields were intact and  normal.  There was no facial numbness, no facial weakness.  Hearing was  normal.  Speech was slightly dysarthric.  Palatal movement was  symmetrical.  NEUROLOGIC:  She had a right mild pronator drift.  Strength of her right  upper extremity was 4/5 proximally and 4+/5 with hand grip.  Strength of  her left extremities and right lower extremity was normal.  Muscle tone  was normal throughout.  Deep tendon reflexes were normal and symmetrical  throughout except for absent ankle reflexes.  Plantar responses were  flexor.  Carotid auscultation was normal.   CLINICAL IMPRESSION:  1. Acute left frontoparietal cortical small vessel infarction.  2. Old right frontoparietal stroke.  3. Transient ischemic attack in August 2009.   RECOMMENDATIONS:  1. Resume Plavix 75 mg per day if the patient is unable to tolerate.  2. Enteric-coated aspirin 81 mg per day.  3. MRI and MRA of the brain without contrast.  4. Carotid Doppler study.  5. Echocardiogram.  6. Physical therapy and occupational therapy consults.  Thank you for asking me to evaluate Ms. Brimage.      Noel Christmas, MD  Electronically Signed     CS/MEDQ  D:  03/15/2008  T:  03/16/2008  Job:  [id number]

## 2010-06-10 NOTE — Discharge Summary (Signed)
[name], [name]              ACCOUNT NO.:  [id number]   MEDICAL RECORD NO.:  [id number]          PATIENT TYPE:  INP   LOCATION:  3732                         FACILITY:  [facility]   PHYSICIAN:  Betti D. Pecola Leisure, M.D.   DATE OF BIRTH:  September 09, 1935   DATE OF ADMISSION:  03/15/2008  DATE OF DISCHARGE:  03/18/2008                               DISCHARGE SUMMARY   REASON FOR ADMISSION:  Weakness and fatigue, history of CVA.  The  patient also has history of hypertension and renal insufficiency.   DISCHARGE DIAGNOSES:  1. Cerebral artery occlusion.  2. Cerebrovascular accident.  3. Hypertension.  4. Hyperlipidemia.  5. Renal insufficiency.   This patient is a 75 year old African American female with a known  history of cerebral infarction involving the right hemisphere in May  2009 as well as a TIA in August 2009 who presented to the ED with new  onset of weakness and numbness involving her right upper extremity which  has started 1-day prior.  She had no speech changes.  She had no changes  in her gait or other neurologic impairment.  She has been taking Plavix,  which she was discharged in August from suffering a TIA.  However, she  has not been on any antiplatelet therapy.  The patient received a CT of  her head while in the emergency department, which showed an acute  infarct in the left frontoparietal cortex.  The patient was admitted for  CVA.  She had already been scheduled by Neurology to receive the MRA and  MRI of her head along with carotid Doppler, echo, PT and OT evaluation.  She was started on Plavix 75 mg daily.  The patient reports of her MRI,  revealed multifocal areas of acute infarction.  The largest of which  effects the left posterior frontoparietal region associated with small  foci of microhemorrhage.  She did have some advanced atrophy and small  vessel disease as well.  MRI of her head revealed a 75% stenosis of the  cavernous segment RICA.  Her TEE revealed LVH  and 60% ejection fraction.  However, her overall left ventricular systolic function was normal.  There was mild asymmetric septal hypertrophy.  There was also an  increased relative contribution of atrial contraction to left  ventricular filling.  There was trivial aortic valvular regurgitation.  The patient was stable for hospitalization with no neurologic impairment  through her stay.  Her vitals remained normal.  Lab work remained  unremarkable.  The patient was discharged home in stable condition with  discharge orders to continue Plavix 75 mg p.o. daily.  She was to have  PT, OT, and speech therapy at outpatient.  She was to continue Crestor  10 mg p.o. daily and she was to follow with Dr. Pecola Leisure on March 23, 2008.           ______________________________  Isidoro Donning. Pecola Leisure, M.D.     BDR/MEDQ  D:  05/20/2008  T:  05/21/2008  Job:  [id number]

## 2010-06-10 NOTE — H&P (Signed)
[place]  Patient:    [name], [name]                     MRN: [id number] Adm. Date:  [id number] Attending:  Mervin Hack Dictator:   Mike Gip, P.A.-C. CC:         M.K. Peglo, M.D. - [facility] [address], [place], [place]   History and Physical  CHIEF COMPLAINT:  Rectal bleeding.  HISTORY OF PRESENT ILLNESS:  [name] is a pleasant 75 year old black female known to Dr. Barbette Hair. Arlyce Dice with a prior history of diverticular bleeding. The patient at this time presents with eight hours of bright red blood per rectum in a large amount.  The patient says she had a total of four bloody bowel movements, primarily dark red blood.  She had no associated abdominal cramping, pain, nausea, vomiting, syncope, or presyncope.  The patient has a history of prior diverticular hemorrhage in June 2000, and underwent a colonoscopy at that time per Dr. Arlyce Dice.  The patient also says that she had a left colon polyp which was removed, and was benign.  The patient was seen and evaluated at this time by Dr. Hedwig Morton. Brodie in the emergency room, with a maroon stool noted in the emergency room, hemodynamically stable, and she was admitted to the hospital with a recurrent acute lower GI bleed, for stabilization as the part of management.  CURRENT MEDICATIONS: 1. Maxzide 75/59 one p.o. q.d. 2. Aleve p.r.n., which she says she does not take on a regular basis.  ALLERGIES:  No known drug allergies.  PAST MEDICAL HISTORY: 1. Diverticular hemorrhage in June 2000. 2. History of colon polyps. 3. Total abdominal hysterectomy. 4. Status post T&A. 5. Varicose vein stripping. 6. Fibroid myomectomy. 7. Hypertension.  FAMILY HISTORY:  The patient questions her father with colon cancer.  The paternal side of the family also with coronary artery disease, diabetes mellitus, and CVAs.  SOCIAL HISTORY:  The patient is single.  She apparently lives with her  elderly aunt whom she cares for at age 54.  That aunt interestingly is here in the hospital at [facility] at this time as well with decubitus ulcers.  She has two daughters and two sons and nine grandchildren.  She is a nonsmoker, nondrinker.  REVIEW OF SYSTEMS:  CARDIOVASCULAR:  Denies any chest pain or anginal symptoms.  PULMONARY:  Negative for cough, shortness of breath, or sputum production.  GENITOURINARY:  Negative for dysuria, urgency, or frequency. MUSCULOSKELETAL:  Pertinent for degenerative joint disease, primarily in her knees, left greater than the right.  GI:  As above.  PHYSICAL EXAMINATION:  GENERAL:  A well-developed black female, in no acute distress.  The patient was alert and oriented x 3.  VITAL SIGNS:  Blood pressure 139/56, pulse 86-96, respirations 16, temperature 97 degrees.  O2 saturation is 98% on room air.  HEENT:  Normocephalic, atraumatic.  EOMI.  PERRLA.  Sclerae anicteric.  NECK:  Supple, without nodes.  No jugular venous distention or bruits.  CARDIOVASCULAR:  A regular rate and rhythm, somewhat tachy, with S1 and S2. No murmur, rub, or gallop.  LUNGS:  Clear to A&P.  ABDOMEN:  Soft, bowel sounds are active.  She is tender in the left lower quadrant.  There is no guarding or rebound or mass.  Liver edge is palpable at the costal margin.  No CVA tenderness.  RECTAL:  Not done.  Maroon stool witnessed per  Dr. Juanda Chance in the emergency room.  EXTREMITIES:  No cyanosis, clubbing, or edema.  LABORATORY DATA:  On admission WBC 6.7 hemoglobin 12, hematocrit 35, MCV 83, BUN 32, creatinine 1.6, platelets 234.  Prothrombin time 13.4 with an INR of 1.1.  IMPRESSION: 9. A 75 year old African-American female with recurrent lower    gastrointestinal bleed, painless, consistent with recurrent    diverticular hemorrhage, currently hemodynamically stable. 2. History of diverticular hemorrhage in June 2000. 3. History of colon polyp. 4.  Hypertension. 5. Anemia, secondary to number one. 6. Status post total abdominal hysterectomy. 7. Status post varicose vein stripping.  PLAN:  The patient is admitted to the service of Dr. [name] for IV fluid hydration, bowel rest, observation, serial H&H, transfusions as necessary. Given that she had colonoscopy in June 2000, likely will not need a repeat colonoscopy unless she has a prolonged or complicated course. DD:  03/04/00 TD:  03/04/00 Job: [id number]

## 2010-06-10 NOTE — Consult Note (Signed)
[name], [name]              ACCOUNT NO.:  [id number]   MEDICAL RECORD NO.:  [id number]          PATIENT TYPE:  INP   LOCATION:  5731                         FACILITY:  [facility]   PHYSICIAN:  Adolph Pollack, M.D.DATE OF BIRTH:  [birth year]   DATE OF CONSULTATION:  10/27/2003  DATE OF DISCHARGE:                                   CONSULTATION   REQUESTING PHYSICIAN:  Iva Boop, M.D.   REASON FOR CONSULTATION:  Recurrent lower GI bleeding, felt to be secondary  to diverticular disease.   HISTORY OF PRESENT ILLNESS:  This is a 75 year old female apparently well  known to [facility].  She has had several admissions over the past  five years secondary to lower GI bleeding felt to be diverticular in origin.  She has had a recent colonoscopy in June of this year for pan  diverticulosis.  She has never required transfusions until this admission  when she came in with recurrent lower GI bleeding.  She subsequently had a  drop in hemoglobin to 7.8, requiring 2 units transfusion with appropriate  increase in hemoglobin.  I subsequently was asked to see her regarding  setting up elective subtotal colectomy.   PAST MEDICAL HISTORY:  1.  Hypertension.  2.  Diverticulosis with multiple lower GI bleeding secondary to diverticular      disease.  3.  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   PAST SURGICAL HISTORY:  1.  Hysterectomy.  2.  Varicose vein stripping.   ALLERGIES:  No known drug allergies.   MEDICATIONS:  1.  She used to take baby aspirin but no longer does.  2.  Triamterene/HCTZ.  3.  Advil p.r.n.   SOCIAL HISTORY:  No tobacco or alcohol use.  She is retired.   FAMILY HISTORY:  Positive for cerebrovascular accident in her father.   REVIEW OF SYSTEMS:  CARDIOVASCULAR:  She denies any known heart disease or  heart failure.  PULMONARY:  No chronic lung disease, asthma, tuberculosis.  GI:  She has had the multiple lower GI bleeds secondary to  diverticular  disease, also history of colonic polyps.  She does have intermittent  constipation.  GU:  Denies kidney stones or urinary tract infections.  ENDOCRINE:  Denies any diabetes or thyroid disease.  NEUROLOGIC:  No strokes  or seizures.   PHYSICAL EXAMINATION:  GENERAL:  Well-developed, well-nourished female in no  acute distress, pleasant and cooperative.  VITAL SIGNS:  Current temperature 99 degrees, blood pressure 190/91, pulse  83, O2 saturation 97% on room air.  EYES:  Extraocular muscles intact.  No icterus.  NECK:  Supple without palpable masses or thyroid enlargement.  RESPIRATORY:  Breath sounds equal and clear.  Respirations not labored.  CARDIOVASCULAR:  Demonstrates regular rate and rhythm.  No murmur heard.  There is no lower extremity edema.  ABDOMEN:  Soft.  It is nontender.  There is a lower midline scar present.  No organomegaly or masses noted.  EXTREMITIES:  Lower extremity scars noted.  Adequate range of motion noted.   LABORATORY AND X-RAY DATA:  Repeat hemoglobin today is  9.6; it is 9.0 this  morning.  INR was normal two days ago at 1.1.   IMPRESSION:  1.  Recurrent lower gastrointestinal bleeding, likely secondary to pan      diverticulosis on colonoscopy.  CT scan done may suggest a little bit if      diverticulitis.  2.  Also has some hypertension.   PLAN:  I did discuss with her considering an elective subtotal colectomy.  I  went over the procedure, the rationale, and the risks with her.  The risks  include but are not limited to bleeding, infection, possible need for  colostomy, anastomotic dehiscence, accidental damage to near abdominal  organs (such as intestines, bladder, ureter, kidneys, liver, spleen, etc.)  and the risk of anesthesia.  We also talked about the significantly  increased frequency of bowel movements and recovery time.  Both she and her  daughter seem to understand this and indicate they will take into   consideration.       TJR/MEDQ  D:  10/27/2003  T:  10/27/2003  Job:  [id number]   cc:   Iva Boop, M.D. [facility]   [name], M.D. [facility]

## 2010-06-10 NOTE — Consult Note (Signed)
[name], [name]              ACCOUNT NO.:  [id number]   MEDICAL RECORD NO.:  [id number]          PATIENT TYPE:  EMS   LOCATION:  URG                          FACILITY:  [facility]   PHYSICIAN:  Jefry H. Pollyann Kennedy, MD     DATE OF BIRTH:  [birth year]   DATE OF CONSULTATION:  12/12/2003  DATE OF DISCHARGE:  12/12/2003                                   CONSULTATION   HISTORY OF PRESENT ILLNESS:  A 75 year old lady with about a 1 week history  of increased swelling, pain and tenderness of the right temporal area with  some breakdown of the skin and drainage of the exudate earlier today. No  antecedent injury or infection. No history of skin lesions or any other  known source. No history of prior skin infections.   PAST MEDICAL HISTORY:  Significant for hypertension. Otherwise, negative.   PHYSICAL EXAMINATION:  GENERAL:  Healthy appearing lady in no distress.  There is no palpable adenopathy.  HEENT:  Oral cavity and pharynx are clear. She is edentulous with dentures  in place. Nasal cavity is clear. Ears normal to inspection without any  evidence of middle ear or outer ear disease. Face significant for a 7 to 8  cm raised, swollen, tender and indurated erythematous region with small  draining site in the right temporalis area and right temporal fossa.   IMPRESSION:  Right temporal fossa abscess of unknown etiology. Suspicious  for resistant Staphylococcus. Recommend incision and drainage.   PROCEDURE:  Xylocaine 2% with epinephrine was infiltrated around the entire  base of the lesion. Approximately 6 cc was used total. A #11 scalpel was  used to incise, right at the pointing area in a direction parallel with  the branches of the facial nerve. A hemostat was used to open into the  abscess cavity where large amounts of thick purulent material was obtained  and sent for culture and sensitivity. The wound was probed and massaged to  remove all of the purulent material. The wound was then  packed with 1/4 inch  Iodoform gauze and a dressing was applied. She tolerated this well and was  instructed to keep the dressing on and keep it dry. She was taking  Augmentin. She was instructed to continue taking this until the culture and  sensitivity returns. She can use Tylenol and/or Ibuprofen for pain. She is  to followup with me on Monday in the office for changing of the packing and  inspection of the wound.      Jefr   JHR/MEDQ  D:  12/12/2003  T:  12/12/2003  Job:  [id number]

## 2010-06-10 NOTE — Discharge Summary (Signed)
NAME:  [name], [name]                        ACCOUNT NO.:  [id number]   MEDICAL RECORD NO.:  [id number]                   PATIENT TYPE:  INP   LOCATION:  2012                                 FACILITY:  [facility]   PHYSICIAN:  Sherin Quarry, MD                   DATE OF BIRTH:  1935-09-08   DATE OF ADMISSION:  07/02/2003  DATE OF DISCHARGE:  07/04/2003                                 DISCHARGE SUMMARY   HISTORY OF PRESENT ILLNESS:  [name] is a 75 year old lady who has  known extensive diverticulosis on the basis of a colonoscopy done in 2002.  She presented to the [facility] emergency room on July 03, 2003, with  several episodes of hematochezia associated with moderate lower quadrant  abdominal discomfort.  The patient had been constipated the week before, but  this seemed to have resolved.  She was having no vomiting or fever, no  breathing difficulty or chest pain.  The only medication she takes on a  regular basis is Maxzide.  Her physical exam is described by Dr. Julio Sicks.  Blood pressure was 100/70.  HEENT exam was within normal limits.  The chest  was clear to auscultation and percussion.  The cardiac exam revealed normal  S1 and S2, without rubs, murmurs, or gallops.  On abdominal exam the bowel  sounds were present.  There was some tenderness in the left lower quadrant  area without guarding or rebound.  Relevant laboratory studies obtained  included a white count of 8,600, hemoglobin was 12.3, sodium was 136,  potassium was 3.0, creatinine was 1.9.  Urinalysis was unremarkable.  The  patient's hemoglobin was carefully monitored during her hospitalization.  She had one additional episode of lower gastrointestinal bleeding during her  time in the hospital and was free of gastrointestinal bleeding for 24 hours  prior to her discharge.  On July 04, 2003, her hemoglobin was noted to be  10.4, and as she was asymptomatic it was felt reasonable to discharge her at  that time.   Prior to her discharge, I contacted Mike Gip, Dr. Regino Schultze  physician's assistant and we made an appointment for the patient to see Dr.  Juanda Chance as an outpatient as she is due for a follow up colonoscopy.  On July 04, 2003, she was discharged.   DISCHARGE DIAGNOSES:  1. Hematochezia, probably secondary to diverticulosis.  2. Possible diverticulitis.  3. Hypokalemia.  4. Renal dysfunction.   DISCHARGE MEDICATIONS:  The patient during the hospitalization was begun on  Cipro and Flagyl for possible diverticulitis.  This antibiotic will be  continued for five additional days.  She will receive Cipro 250 mg b.i.d.  and Flagyl 250 mg t.i.d.  She was given Micro-K 10 mEq daily.   FOLLOW UP:  Follow up appointment was made with Dr. Juanda Chance on July 15, 2003.   CONDITION ON DISCHARGE:  Good.  Sherin Quarry, MD    SY/MEDQ  D:  07/04/2003  T:  07/05/2003  Job:  [id number]   cc:   [name], M.D. [facility]   [facility]  c/o Dr. Agapito Games  [place]

## 2010-06-10 NOTE — Discharge Summary (Signed)
[place]. [facility]  Patient:    [name], [name]                     MRN: [id number] Adm. Date:  [id number] Disc. Date: 03/05/00 Attending:  Mervin Hack Dictator:   Dianah Field, P.A. CC:         M. K. Peglo, M.D., [facility] Dr.  Barbette Hair. Arlyce Dice, M.D. [facility]   Discharge Summary  ADMISSION DIAGNOSES:  1. Recurrent lower painless gastrointestinal bleed.  The patient has a     history of diverticular hemorrhage and this current episode likely     represents recurrent diverticular bleed.  2. Status post diverticular bleed in June of 2000.  3. History of colon polyp.  4. Hypertension.  5. Normocytic anemia secondary to gastrointestinal bleed with hemoglobin of     12 on admission.  6. Status post total abdominal hysterectomy.  7. Status post lower extremity varicose vein stripping.  8. Degenerative joint disease.  9. Primary hypertension. 10. Status post fibroid myomectomy. 11. Status post tonsillectomy and adenoidectomy.  DISCHARGE DIAGNOSES: 1. Recurrent, self limited diverticular bleed. 2. Normocytic anemia secondary to gastrointestinal blood loss. 3. Hypertension with excellent control of blood pressure during this    admission.  BRIEF HISTORY:  [name] is a pleasant 75 year old African-American female who presented to the emergency room on March 03, 2000.  For about eight hours prior to admission, she had had a total of four bloody bowel movements consisting of dark red blood.  This was not associated with any presyncopal symptoms, nausea, vomiting, or abdominal cramping or pain.  The patient had had workup for lower GI bleed in June of 2000 at [facility] where she was admitted for about four days.  Colonoscopy on July 09, 1998, by Dr. Arlyce Dice showed extensive diverticulosis from the sigmoid to the ascending colon.  A 5 mm nonbleeding sessile polyp in the descending colon was removed via snare.   The pathology on this polyp is not available at present. In any event, the patient had done well since then with the exception of this recurrent lower GI bleeding for which she sought emergency room evaluation. She was admitted in stable condition by Hedwig Morton. Juanda Chance, M.D.  The hemoglobin in the emergency room was 12, the coags were within normal limits.  PROCEDURE:  None.  CONSULTING PHYSICIANS:  None.  IMAGING STUDIES:  Acute abdominal series with chest showed changes consistent with chronic bronchitis and benign abdominal appearance.  LABORATORY DATA:  Initial hemoglobin was 12.  It went to a low of 9.7 and was 10.1 at discharge.  MCV was 83.2.  White blood cell count 6.7, platelets 212. PT 13.4, INR 1.1, and PTT 25.  Iron 61, total iron binding capacity 358, and saturation was 17%.  Potassium 4.9, glucose 104, BUN 32.  HOSPITAL COURSE:  The patient was admitted to unit 3000.  Within the next 12 hours, she had two small bowel movements.  On Sunday morning, she had a small amount of blood per rectum.  After that, she had no further bleeding.  At no point during her hospitalization, did her blood pressure plummet.  She was not tachycardic.  Serial CBCs were obtained and other than the initial drop of about 2 grams in her hemoglobin, her hemoglobin stabilized.  She did not require any transfusion with blood cells.  Her diet was slowly advanced to full liquids.  On the morning of  February 11, she was discharged to home in stable condition.  FOLLOW-UP:  Call or see Dr. Arlyce Dice on an as-needed basis for any future GI problems.  She has a primary care doctor, M. K. Peglo, M.D., who she can follow up with for any future needs for management of general medical issues.  She was advised to stay away from Aleve or any aspirin products for the next couple of weeks.  DISCHARGE MEDICATIONS: 1. Maxzide 75/50 one p.o. q.d. 2. Tylenol/acetominophen p.r.n. pain or headache. 3. Restart Aleve as  needed on February 26.  ACTIVITY:  To be as tolerated.  DIET:  Unrestricted.  CONDITION ON DISCHARGE:  Stable. DD:  03/05/00 TD:  03/05/00 Job: [id number]

## 2010-06-10 NOTE — Discharge Summary (Signed)
[name], Tyler              ACCOUNT NO.:  [id number]   MEDICAL RECORD NO.:  [id number]          PATIENT TYPE:  INP   LOCATION:  5731                         FACILITY:  [facility]   PHYSICIAN:  Emma Quarry, MD      DATE OF BIRTH:  01-27-1935   DATE OF ADMISSION:  10/25/2003  DATE OF DISCHARGE:  10/29/2003                                 DISCHARGE SUMMARY   [name] is a 75 year old lady who initially presented on October 2  with lower abdominal pain associated with hematochezia.  In the past  episodes of bleeding have been attributed to diverticulosis.   PHYSICAL EXAMINATION:  (As described by Dr. Crista Curb.)  VITAL SIGNS:  Temperature 97.9, blood pressure 144/72, pulse 77,  respirations 20.  HEENT:  Within normal limits.  CHEST:  Clear to auscultation and percussion.  CARDIOVASCULAR:  Regular rate and rhythm without murmurs, rubs, or gallops.  ABDOMEN:  Soft.  Normal bowel sounds.  There was mild left lower quadrant  tenderness without guarding or rebound.  RECTAL:  Bright red blood.  NEUROLOGIC:  Normal.  EXTREMITIES:  Normal.   LABORATORIES:  Hemoglobin 9.4, hematocrit 28.1.  INR was 1.1.  Potassium  3.2.  Liver profile was within normal limits.  TSH was normal.   On admission Dr. Lendell Tyler started an IV of normal saline with 40 mEq of KCl  at 125 mL/hour.  The patient was given Protonix 40 mg IV daily and morphine  as needed for pain.  Her hemoglobin and hematocrit were monitored q.6h.  Consultation was obtained with Dr. Marina Tyler of [facility].  A  colonoscopy was performed which showed evidence of extensive diverticulosis  from the cecum to the sigmoid colon described as predominant in the sigmoid  colon.  The patient also had a small polyp that was removed.  X-ray studies  obtained included a CT scan of the abdomen and pelvis which was felt to be  compatible with diverticulitis as well as the finding of diverticulosis.  For this reason, the  patient was placed on intravenous Cipro 400 mg q.12h.  and Flagyl 500 mg q.8h.  During the course of the patient's hospitalization  she also had a transabdominal and transvaginal pelvic ultrasound done to  follow up on a previous study.  This continued to show a complex fluid  collection in the midline above the vaginal cuff that had been unchanged  over the previous year.  Because there was no change in this finding, it was  felt to be benign in nature.  Dr. Leone Tyler who followed the patient for  [facility] suggested that surgical consultation was warranted  because of the patient's recurrent and persistent lower gastrointestinal  bleeding.  Dr. Abbey Tyler saw the patient in consultation and recommended  that a subtotal colectomy be done on an elective basis.  He recommended the  patient return to his office in two weeks to discuss this.  Despite passage  of small amounts of blood, the patient's hemoglobin on October 6 was stable  at 9.8.  She did receive a total of 2 units of packed cells  during the  course of the hospitalization.  On October 6 [name] was discharged.  At the time of discharge she was instructed to follow up with Dr. Abbey Tyler  in one to two weeks and also with [facility] in one to two weeks.  She was  instructed to take Cipro 500 mg b.i.d. for two additional days and Flagyl  250 mg t.i.d. for two additional days.  She was also advised to resume her  Dyazide one tablet daily.   DISCHARGE DIAGNOSES:  1.  Pan colonic diverticulosis with persistent and recurrent low grade      bleeding.  2.  Mild diverticulitis.  3.  History of hypertension.   CONDITION ON DISCHARGE:  Good.       SY/MEDQ  D:  11/13/2003  T:  11/13/2003  Job:  [id number]   cc:   [name], M.D. [name]   [facility]   [name], M.D.  [address], [address]  [place]  [place] [address]  Fax: [phone]

## 2010-06-10 NOTE — H&P (Signed)
[name], [name]              ACCOUNT NO.:  [id number]   MEDICAL RECORD NO.:  [id number]          PATIENT TYPE:  INP   LOCATION:  1423                         FACILITY:  [facility]   PHYSICIAN:  Della Goo, M.D. DATE OF BIRTH:  09/01/1935   DATE OF ADMISSION:  04/15/2006  DATE OF DISCHARGE:                              HISTORY & PHYSICAL   PRIMARY CARE PHYSICIAN:  Unassigned.   CHIEF COMPLAINTS:  Abdominal pain, fevers, chills.   HISTORY OF PRESENT ILLNESS:  This is a 75 year old female who presented  to the emergency department with complaints of severe abdominal pain  8/10 associated with fevers and chills the past 24 hours.  She reports  having left lower quadrant abdominal pain off and on for a few weeks.  Over the past 24 hours she has also had nausea, no vomiting.  She also  reports having increased bloating and flatulence after eating for the  past week.  The patient denies having any constipation or diarrhea or  passing melena.   PAST MEDICAL HISTORY:  Diverticulosis, hypertension, hyperlipidemia,  asthma, allergies.   PAST SURGICAL HISTORY:  Status post total abdominal hysterectomy and  status post right ear mastoidectomy.   MEDICATIONS:  Hydrochlorothiazide and Vytorin.   ALLERGIES:  ASPIRIN BECAUSE IT CAUSES HIVES.   SOCIAL HISTORY:  The patient lives with her daughter.  She is retired.  Had five children, four of which are living.  No history of alcohol or  tobacco usage.   FAMILY HISTORY:  Positive for hypertension in both parents, no history  of coronary artery disease or cancer and positive diabetes mellitus in  her paternal family.   REVIEW OF SYSTEMS:  Pertinents are mentioned above.   PHYSICAL EXAMINATION FINDINGS:  GENERAL:  A 75 year old well-nourished,  well-developed female in discomfort but no acute distress.  VITAL SIGNS:  Temperature initially 102.6, now 100.9; blood pressure  initially 137/85, now 91/56; heart rate initially 150, now  99;  respirations 18-20, O2 saturations 97-100% on room air.  Skin:  Warm, dry.  There is no jaundice.  HEENT:  Normocephalic, atraumatic.  Pupils equally round, reactive to  light.  Extraocular muscles are intact.  Funduscopic benign.  There is  no scleral icterus.  Oropharynx is clear.  Mucosa moist.  NECK:  Supple, full range of motion.  No thyromegaly, adenopathy or  jugular venous distension.  CARDIOVASCULAR:  Tachycardiac rate and rhythm.  No murmurs, gallops or  rubs.  LUNGS:  Clear to auscultation bilaterally.  ABDOMEN:  Positive bowel sounds, soft, mildly tender left lower quadrant  area, no rebound, no guarding, no hepatosplenomegaly.  EXTREMITIES:  Without cyanosis, clubbing or edema.  NEUROLOGIC EXAMINATION:  Alert and oriented x3.  There are no focal  deficits.   LABORATORY STUDIES:  White blood cell count 14.6, hemoglobin 12.4,  hematocrit 36.6, platelets 279, neutrophils 92% lymphocytes 4%.  Sodium  137, potassium 3.5, chloride 103, carbon dioxide 21, BUN 35, creatinine  1.8, glucose 124, albumin 3.1, AST 22, lipase 16.  CT of the abdomen  revealed thickened descending colon with gas in the colon wall,  mesentery and  portal venous tract.  There is no free air or free fluid  and no small bowel pneumatosis.   ASSESSMENT:  A 75 year old female with abdominal pain being admitted  with:  1. Abdominal pain.  2. Diverticulitis.  3. Hypotension.  4. Mild dehydration.  5. Tachycardia.   PLAN:  The patient has been placed on IV antibiotic therapy of  ciprofloxacin and Flagyl.  Surgery had been consulted after results of  the CT scan revealed gas in the colonic wall as described above.  Dr.  Quentin Mulling on call stated that the condition usually resolves; however, it  is to be monitored in case it does progress, otherwise medical  management is indicated.  The patient will continue on IV fluids and  will have IV pain control and IV nausea control.  Blood pressure  medicines  are on hold for now.  GI prophylaxis has been ordered with IV  Protonix and SCDs have been ordered for DVT prophylaxis.      Della Goo, M.D.  Electronically Signed     HJ/MEDQ  D:  04/15/2006  T:  04/15/2006  Job:  [id number]

## 2010-06-10 NOTE — Assessment & Plan Note (Signed)
[facility]                         GASTROENTEROLOGY OFFICE NOTE   EDITHA, BRIDGEFORTH                     MRN:          [id number]  DATE:05/15/2006                            DOB:          Jun 05, 1935    Ms. Scrivens is a very nice 75 year old African American female who was  recently hospitalized with diverticulitis from April 14, 2006 to April 19, 2006.  CT scan of the abdomen showed gas in the colonic wall.  She  was treated with intravenous Levaquin, Cipro, and Flagyl.  She was  discharged on Cipro, and completed the course about 2 weeks ago.  She  has done well since then, having regular bowel movements.  No  constipation, and no abdominal pain.  Ms. Creech also has history of  adenomatous polyp of the colon found on colonoscopy in 2002.  She had a  history of diverticular bleed in October 2005.   MEDICATIONS:  1. Singulair 10 mg daily.  2. Norvasc 5 mg p.o. daily.  3. Vytorin 10/21 p.o. daily.  4. Hydrochlorothiazide 12.5 mg p.o. daily.   PHYSICAL EXAMINATION:  Blood pressure 130/70.  Pulse 60.  Weight 153  pounds.  She was alert, oriented, in no distress.  LUNGS:  Clear to auscultation.  COR:  With normal S1 and normal S2.  ABDOMEN:  Soft with normoactive bowel sounds.  Today, there was no  tenderness.  Specifically, left lower and left middle quadrants were  unremarkable.  RECTAL:  Exam not done.   IMPRESSION:  A 75 year old African American female, status post  diverticulitis, history of colon polyps, and diverticular bleed,  currently doing well.   PLAN:  1. Continue Activia yogurt.  2. Samples of iron given for iron deficiency anemia.  3. Patient is due for repeat colonoscopy, which has been scheduled in      the near future.     Hedwig Morton. Juanda Chance, MD  Electronically Signed    DMB/MedQ  DD: 05/15/2006  DT: 05/15/2006  Job #: [id number]   cc:   Dineen Kid. Reche Dixon, M.D.

## 2010-06-10 NOTE — Discharge Summary (Signed)
[name], [name]              ACCOUNT NO.:  [id number]   MEDICAL RECORD NO.:  [id number]          PATIENT TYPE:  INP   LOCATION:  1423                         [facility]ACILITY[facility]   PHYSICIAN:  Herbie Saxon, MDDATE OF BIRTH:  1935-07-28   DATE OF ADMISSION:  04/14/2006  DATE OF DISCHARGE:  04/19/2006                               DISCHARGE SUMMARY   DISCHARGE SUMMARY   DISCHARGE DIAGNOSES:  Diverticulitis, improved.  Hypertension, stable.  Bronchial asthma.  Hyperlipidemia.  History of seasonal allergies.  Anemia.  Mild dehydration, resolved.   HOSPITAL COURSE:  This 75 year old lady presented to the emergency room  complaining of abdominal pain, fever, chills, increased abdominal  bloating and flatulence of 1 week duration.  CT abdomen did reveal gas  in the colonic wall.  She was started on IV Cipro, Flagyl and IV fluid  hydration.  Tachycardia did improve.  She is afebrile at present.  The  Cipro was changed to IV Levaquin on the second day of admission, which  she is tolerating already.  She is being discharged home in stable  condition.  She is to follow up with her primary care physician of her  choice in 1 week.   DIET:  Diet will be low sodium, heart healthy, low fat, low cholesterol.   ACTIVITY:  No restrictions.   MEDICATIONS:  Norvasc 5 mg p.o. daily.  Ciprofloxacin 500 mg p.o.  b.i.d., Vytorin 1 tablet 10/20 mg p.o. q.h.s., hydrochlorothiazide 12.5  mg daily.   PHYSICAL EXAMINATION:  On examination today she is an elderly lady in no  acute distress.  Temperature is 98, pulse is 80, respiratory rate is 20  and blood pressure 120/70.  Pale, not jaundiced.  Neck is supple.  Chest  is clear.  Abdomen is soft, nontender, no organomegaly.  Peripheral  pulses present.  There is no pedal edema.  Cranial nerves I to XII  intact.  No dehydration, no clubbing, no cyanosis.  Oropharynx and  nasopharynx are clear.  She has normocephalic.  Her neck is supple.   LABORATORY DATA:  WBC is 8.  Hematocrit 34, platelet count is 183.  Chemistry:  Glucose is 89, sodium 139, potassium 3.8, chloride 112,  bicarbonate 22, BUN is 9, creatinine 1.3.  CT abdomen and pelvis on  admission shows distended colonic wall thickening with pneumatosis coli  and mesenteric portal venous gas, query diverticulitis.  Extensive  colonic diverticulosis.      Herbie Saxon, MD  Electronically Signed     MIO/MEDQ  D:  04/19/2006  T:  04/19/2006  Job:  [id number]

## 2010-06-10 NOTE — Discharge Summary (Signed)
NAMEMarland Kitchen  [name], [name]                        ACCOUNT NO.:  [id number]   MEDICAL RECORD NO.:  [id number]                   PATIENT TYPE:  INP   LOCATION:  3742                                 FACILITY:  [facility]   PHYSICIAN:  Dineen Kid. Reche Dixon, M.D.               DATE OF BIRTH:  05-14-1935   DATE OF ADMISSION:  10/29/2002  DATE OF DISCHARGE:  10/30/2002                                 DISCHARGE SUMMARY   HISTORY:  This is a 75 year old, African-American female with a history of  diverticulosis and recurrent diverticular bleeds, who has required in-  patient admission for the same diagnosis x 2 in the past, who presented to  the [facility] ED with chief complaint of bright red blood per rectum.  Patient states that, approximately four days prior to the time of admission,  she had a bowel movement, which was accompanied by bright red blood in the  toilet.  Patient says that following that day, she virtually stopped p.o.  intake.  She was afraid that if she had another bowel movement she would  bleed again, and she did not want to bleed.  Patient did not have any other  bright red blood per rectum episodes before admission.  After the initial  event, did not experience syncope.  Did experience one episode of dizziness  associated with standing up and a sensation of the room going dark briefly.  Patient had no other CNS signs or symptoms.  No nausea or vomiting, only  mild abdominal discomfort.  Patient was admitted with diagnosis of GI bleed  for bowel rest, transfusion, serial H&H and rehydration.   ADMISSION PHYSICAL:  GENERAL:  Upon presentation to [facility] ED, the  patient was clinically stable, in no apparent distress.  Did not appear  diaphoretic or toxic.  VITAL SIGNS:  Temperature 98.1, blood pressure 120/70, heart rate 70,  respirations 20, 96% on room air.   LABORATORY DATA:  White blood cell count 5.4, hemoglobin 12.6, hematocrit  36.7, platelets 246; sodium 138, potassium  3.2, chloride 104, CO2 27, BUN  32, creatinine 2, glucose 113; fecal occult blood was positive; UA was  negative.   HOSPITAL COURSE:  #1.  GASTROINTESTINAL BLEED:  Patient was transfused with  2 units of packed red blood cells and given normal saline at 250 cc/h with  10 mEq potassium per liter.  This infusion rate was eventually tapered off  to normal saline lock intravenously.  Patient's blood per rectum ceased  overnight with only blood-specked stool x 2 during the stay.  Patient's  hemoglobin dropped to 11, but patient remained asymptomatic and did not  express bright red blood per rectum following admission.   #2.  ACUTE RENAL INSUFFICIENCY:  Creatinine of 2 and potassium of 3.2 at the  time of admission, likely due to patient's lack of p.o. intake for three to  four days prior to admission.  Creatinine decreased with rehydration to 1.7  at the time of discharge.  Potassium increased to 3.8 with supplemental  potassium.  Good urine output prior to discharge.  Patient was afebrile.  No  signs of infection.   #3.  HYPERTENSION:  Was well controlled with Maxzide 75/50, 10 q.d., during  her stay.   #4.  DISPOSITION:  Patient was discharged home, clinically stable, with  instructions to return to [facility] ED or her primary care Gao Mitnick, Dr.  Mosetta Putt, if she experiences positive bright red blood per rectum that would  not stop spontaneously after 15-20 minutes.   PROCEDURES WHILE IN THE HOSPITAL:  1. Chest x-ray showed no acute disease.  No change from previous x-ray in     February 2002.  2. CT of the abdomen and pelvis without contrast.  Showed generalized     diverticulosis.  No evidence of obstruction, abscess, or perforation.   DISCHARGE MEDICATIONS:  Maxzide 75/50, 1 p.o. q.d.   DISCHARGE LABORATORIES:  White count 5.4, hemoglobin 11, hematocrit 22.6,  platelets 246, MCV of 80; sodium 142, potassium 3.8, chloride 111, CO2 25,  BUN 21, creatinine 1.7, glucose 97.   FOLLOW  UP:  1. Patient was given lunch prior to discharge, and instructed to follow up     with Dr. Mosetta Putt, her primary care physician.  Appointment to be made by     patient within one week.  2. Also, to return to [facility] ED for the afore-stated reasons.      Franklyn Lor, MD                            Dineen Kid. Reche Dixon, M.D.    TD/MEDQ  D:  10/30/2002  T:  10/31/2002  Job:  [id number]

## 2010-06-10 NOTE — H&P (Signed)
[name], [name]              ACCOUNT NO.:  [id number]   MEDICAL RECORD NO.:  [id number]          PATIENT TYPE:  INP   LOCATION:  1828                         FACILITY:  [facility]   PHYSICIAN:  Corinna L. Lendell Caprice, MDDATE OF BIRTH:  Oct 05, 1935   DATE OF ADMISSION:  10/25/2003  DATE OF DISCHARGE:                                HISTORY & PHYSICAL   CHIEF COMPLAINT:  Bleeding and abdominal pain.   HISTORY OF PRESENT ILLNESS:  [name] is a 75 year old black female  patient, unassigned, who has seen [facility] several times in the past.  She  presents to the emergency room after being awoken with lower abdominal pain  and bright red blood per rectum.  She reports that the amount was quite  large and she had bleeding all over her bed.  She also noted that she had  had stool mixed with blood last night.  She had an outpatient colonoscopy in  June of 2005, results unknown, but there is a pathology report in the  computer showing tubular adenoma.  The patient was admitted in June and felt  to have mild diverticulitis and a diverticular bleed and her bleeding  stopped, and she was discharged with outpatient followup.  She has had some  nausea, but her appetite has been good.  She has lost about 25 pounds over  the past year.  She has been trying to lose weight, but reports that she has  not been on a diet.   PAST MEDICAL HISTORY:  1.  Hypertension.  2.  Diverticulitis; history of diverticular bleeding.  3  Abnormal CAT scan in June of 2005 which showed a cystic structure above  the vaginal cuff, likely ovarian tissue, but potentially postoperative.   MEDICATIONS:  1.  Triamterene/hydrochlorothiazide 75/50 mg a day.  2.  Advil as needed.   ALLERGIES:  No known drug allergies.   SOCIAL HISTORY:  The patient is a retired Designer, jewellery.  She is here  with her daughter.  She does not smoke or drink.   FAMILY HISTORY:  Her father died of a stroke.  Several aunts had unknown  cancer, 1 of whom possibly had ovarian cancer.   REVIEW OF SYSTEMS:  Review of systems as above, otherwise negative.   PHYSICAL EXAMINATION:  VITAL SIGNS:  Temperature is 97.9, blood pressure  144/72, pulse 77, respiratory rate 20, oxygen saturation 97% on room air.  GENERAL:  In general, the patient is a comfortable black female.  HEENT:  Normocephalic, atraumatic.  Pupils equal, round and reactive to  light.  She is edentulous, has moist mucous membranes.  NECK:  Neck is supple.  No lymphadenopathy.  No carotid bruits.  LUNGS:  Lungs are clear to auscultation bilaterally without wheezes, rhonchi  or rales.  CARDIOVASCULAR:  Regular rate and rhythm without murmurs, gallops or rubs.  She does have some tenderness over the chest wall on the left.  ABDOMINAL EXAM:  Normal bowel sounds.  Soft.  She has some lower abdominal  tenderness, no rebound tenderness, nondistended.  GU:  Deferred.  RECTAL:  Bright red blood.  No mass per ER physician.  EXTREMITIES:  No clubbing, cyanosis, or edema.  SKIN:  No rash.  PSYCHIATRIC:  Normal affect.  NEUROLOGIC:  Alert and oriented.  Cranial nerves and sensorimotor exam are  intact.   LABORATORIES:  White blood count is 11, hemoglobin 9.9, hematocrit 29,  platelet count and differential are normal.  INR is 1.1, PTT is 23.  Basic  metabolic panel significant for a potassium of 3.2, glucose of 138,  otherwise, unremarkable.   ASSESSMENT AND PLAN:  1.  Lower gastrointestinal bleed with lower abdominal pain and tenderness:      The patient does have a history of diverticular bleed; this may be due      to this or possibly diverticulitis.  Dr. [name] has seen patient      many times in the past.  Dr. Wilhemina Bonito. Marina Goodell was called by Dr. Doug Sou and asked that medicine admit the patient.  She is currently      hemodynamically stable and I will admit her to the floor.  I will check      a CT of the abdomen and pelvis to rule out ischemic or  infectious      process.  She will get serial hemoglobin levels and transfusion as      needed.  She reportedly had an outpatient colonoscopy, results are      unknown.  Gastroenterology can be consulted if needed.  2.  Hypertension:  Her medications will be held.  3.  Recent weight loss:  I will check a TSH.  4.  Abnormal CAT scan:  See above.  Her CAT scan of the pelvis will be      repeated today.  She may need a pelvic ultrasound if this has not been      done, which the patient does not think it has.      Cori   CLS/MEDQ  D:  10/25/2003  T:  10/25/2003  Job:  [id number]   cc:   [name], M.D. [facility]

## 2010-06-10 NOTE — Assessment & Plan Note (Signed)
[name]                         GASTROENTEROLOGY OFFICE NOTE   [name], [name]                     MRN:          [id number]  DATE:04/03/2006                            DOB:          11/20/1935    [name] is a 75 year old African-American female with the history  of diverticulosis and recurrent diverticular bleeds, the last one in  2005.  Her last colonoscopy was in March 2004, showing multiple  diverticula.  The patient has recently been having a little bloating,  gas and flatulence, no abdominal pain or rectal bleeding.  She takes a  lot of Advil.  She denies any upper GI symptoms of dyspepsia.  Since we  have seen her last time, she lost about 20 pounds, partially  intentionally.   MEDICATIONS INCLUDE:  1. Triamterene/HCTZ 75/50 one p.o. daily.  2. Vytorin 10/40 one p.o. daily.  3. Singulair 10 mg daily.  4. Advil on p.r.n. basis.   PHYSICAL EXAM:  Blood pressure 130/70, pulse 60 and weight 159 pounds.  She was alert and oriented, in no distress.  LUNGS:  Clear to auscultation.  COR:  With normal S1, normal S2.  ABDOMEN:  Soft with normoactive bowel sounds, no distention, no  tenderness.  Epigastrium was normal over costal margin.  RECTAL EXAM:  Soft,  Hemoccult-positive stool.   IMPRESSION:  The patient is a 75 year old African-American female with  history of GI bleed with diverticulosis, now Hemoccult-positive stool.  It is noted that she also had Hemoccult-positive stool on my exam in  June 2005.  Last colonoscopy four years ago.  She had a polyp in 2002.   PLAN:  1. CBC  and iron studies today.  2. Hemoccult cards.  If positive, she will need colonoscopy, if      negative consider capsule endoscopy  3. Align samples given, one daily to treat bacterial overgrowth.  4. Activa yogurt two or three times a week.     Hedwig Morton. Juanda Chance, MD  Electronically Signed    DMB/MedQ  DD: 04/03/2006  DT: 04/05/2006  Job #:  [id number]   cc:   Dineen Kid. Reche Dixon, M.D.

## 2010-07-22 ENCOUNTER — Emergency Department (HOSPITAL_COMMUNITY): Payer: PRIVATE HEALTH INSURANCE

## 2010-07-22 ENCOUNTER — Observation Stay (HOSPITAL_COMMUNITY): Payer: PRIVATE HEALTH INSURANCE

## 2010-07-22 ENCOUNTER — Inpatient Hospital Stay (HOSPITAL_COMMUNITY)
Admission: EM | Admit: 2010-07-22 | Discharge: 2010-07-25 | DRG: 312 | Disposition: A | Discharge status: Home Health | Payer: PRIVATE HEALTH INSURANCE | Attending: Internal Medicine | Admitting: Internal Medicine

## 2010-07-22 DIAGNOSIS — R791 Abnormal coagulation profile: Secondary | ICD-10-CM | POA: Diagnosis present

## 2010-07-22 DIAGNOSIS — E860 Dehydration: Secondary | ICD-10-CM | POA: Diagnosis present

## 2010-07-22 DIAGNOSIS — I129 Hypertensive chronic kidney disease with stage 1 through stage 4 chronic kidney disease, or unspecified chronic kidney disease: Secondary | ICD-10-CM | POA: Diagnosis present

## 2010-07-22 DIAGNOSIS — I69969 Other paralytic syndrome following unspecified cerebrovascular disease affecting unspecified side: Secondary | ICD-10-CM

## 2010-07-22 DIAGNOSIS — N183 Chronic kidney disease, stage 3 unspecified: Secondary | ICD-10-CM | POA: Diagnosis present

## 2010-07-22 DIAGNOSIS — R55 Syncope and collapse: Principal | ICD-10-CM | POA: Diagnosis present

## 2010-07-22 DIAGNOSIS — N179 Acute kidney failure, unspecified: Secondary | ICD-10-CM | POA: Diagnosis present

## 2010-07-22 DIAGNOSIS — T50995A Adverse effect of other drugs, medicaments and biological substances, initial encounter: Secondary | ICD-10-CM | POA: Diagnosis present

## 2010-07-22 DIAGNOSIS — R42 Dizziness and giddiness: Secondary | ICD-10-CM | POA: Diagnosis present

## 2010-07-22 DIAGNOSIS — R269 Unspecified abnormalities of gait and mobility: Secondary | ICD-10-CM | POA: Diagnosis present

## 2010-07-22 DIAGNOSIS — D509 Iron deficiency anemia, unspecified: Secondary | ICD-10-CM | POA: Diagnosis present

## 2010-07-22 DIAGNOSIS — Z86711 Personal history of pulmonary embolism: Secondary | ICD-10-CM

## 2010-07-22 LAB — DIFFERENTIAL
Basophils Absolute: 0 10*3/uL (ref 0.0–0.1)
Lymphocytes Relative: 9 % — ABNORMAL LOW (ref 12–46)
Monocytes Absolute: 0.5 10*3/uL (ref 0.1–1.0)
Monocytes Relative: 5 % (ref 3–12)
Neutro Abs: 9 10*3/uL — ABNORMAL HIGH (ref 1.7–7.7)

## 2010-07-22 LAB — URINE MICROSCOPIC-ADD ON

## 2010-07-22 LAB — CARDIAC PANEL(CRET KIN+CKTOT+MB+TROPI)
CK, MB: 1.9 ng/mL (ref 0.3–4.0)
CK, MB: 2 ng/mL (ref 0.3–4.0)
Relative Index: INVALID (ref 0.0–2.5)
Total CK: 88 U/L (ref 7–177)
Troponin I: <0.3 ng/mL (ref <0.30)

## 2010-07-22 LAB — URINALYSIS, ROUTINE W REFLEX MICROSCOPIC
Bilirubin Urine: NEGATIVE
Nitrite: NEGATIVE
Specific Gravity, Urine: 1.022 (ref 1.005–1.030)
pH: 5.5 (ref 5.0–8.0)

## 2010-07-22 LAB — COMPREHENSIVE METABOLIC PANEL
Alkaline Phosphatase: 62 U/L (ref 39–117)
BUN: 46 mg/dL — ABNORMAL HIGH (ref 6–23)
CO2: 25 mEq/L (ref 19–32)
Chloride: 103 mEq/L (ref 96–112)
Creatinine, Ser: 2.14 mg/dL — ABNORMAL HIGH (ref 0.50–1.10)
GFR calc Af Amer: 27 mL/min — ABNORMAL LOW (ref >60)
GFR calc non Af Amer: 23 mL/min — ABNORMAL LOW (ref >60)
Glucose, Bld: 147 mg/dL — ABNORMAL HIGH (ref 70–99)
Total Bilirubin: 0.3 mg/dL (ref 0.3–1.2)

## 2010-07-22 LAB — CBC
HCT: 38.2 % (ref 36.0–46.0)
Hemoglobin: 12.6 g/dL (ref 12.0–15.0)
MCHC: 33 g/dL (ref 30.0–36.0)
RBC: 4.51 MIL/uL (ref 3.87–5.11)

## 2010-07-22 LAB — CK TOTAL AND CKMB (NOT AT ARMC): Relative Index: INVALID (ref 0.0–2.5)

## 2010-07-22 LAB — PROTIME-INR: INR: 1.08 (ref 0.00–1.49)

## 2010-07-23 LAB — BASIC METABOLIC PANEL
BUN: 39 mg/dL — ABNORMAL HIGH (ref 6–23)
Chloride: 106 mEq/L (ref 96–112)
Creatinine, Ser: 1.56 mg/dL — ABNORMAL HIGH (ref 0.50–1.10)
GFR calc Af Amer: 39 mL/min — ABNORMAL LOW (ref >60)
GFR calc non Af Amer: 33 mL/min — ABNORMAL LOW (ref >60)
Potassium: 4 mEq/L (ref 3.5–5.1)

## 2010-07-23 LAB — CBC
Hemoglobin: 10.9 g/dL — ABNORMAL LOW (ref 12.0–15.0)
Platelets: 145 10*3/uL — ABNORMAL LOW (ref 150–400)
RBC: 3.84 MIL/uL — ABNORMAL LOW (ref 3.87–5.11)

## 2010-07-23 LAB — LIPID PANEL
Cholesterol: 216 mg/dL — ABNORMAL HIGH (ref 0–200)
HDL: 68 mg/dL (ref >39)
Total CHOL/HDL Ratio: 3.2 RATIO

## 2010-07-23 LAB — PROTIME-INR
INR: 1.23 (ref 0.00–1.49)
Prothrombin Time: 15.8 seconds — ABNORMAL HIGH (ref 11.6–15.2)

## 2010-07-24 LAB — BASIC METABOLIC PANEL
CO2: 20 mEq/L (ref 19–32)
Chloride: 110 mEq/L (ref 96–112)
Creatinine, Ser: 1.32 mg/dL — ABNORMAL HIGH (ref 0.50–1.10)
GFR calc Af Amer: 48 mL/min — ABNORMAL LOW (ref >60)
Potassium: 3.8 mEq/L (ref 3.5–5.1)

## 2010-07-24 LAB — PROTIME-INR
INR: 1.24 (ref 0.00–1.49)
Prothrombin Time: 15.9 seconds — ABNORMAL HIGH (ref 11.6–15.2)

## 2010-07-25 LAB — BASIC METABOLIC PANEL
Chloride: 109 mEq/L (ref 96–112)
GFR calc Af Amer: 50 mL/min — ABNORMAL LOW (ref >60)
GFR calc non Af Amer: 41 mL/min — ABNORMAL LOW (ref >60)
Potassium: 4.1 mEq/L (ref 3.5–5.1)
Sodium: 139 mEq/L (ref 135–145)

## 2010-07-25 LAB — GLUCOSE, CAPILLARY: Glucose-Capillary: 105 mg/dL — ABNORMAL HIGH (ref 70–99)

## 2010-07-25 LAB — PROTIME-INR
INR: 1.23 (ref 0.00–1.49)
Prothrombin Time: 15.8 seconds — ABNORMAL HIGH (ref 11.6–15.2)

## 2010-08-11 NOTE — Discharge Summary (Signed)
[name], [name]              ACCOUNT NO.:  [id number]  MEDICAL RECORD NO.:  [id number]  LOCATION:  4743                         FACILITY:  [facility]  PHYSICIAN:  Altha Harm, MDDATE OF BIRTH:  [birth year]  DATE OF ADMISSION:  07/22/2010 DATE OF DISCHARGE:  07/25/2010                              DISCHARGE SUMMARY   DISCHARGE DISPOSITION:  Home with home health nursing and physical therapy.  FINAL DISCHARGE DIAGNOSES: 1. Presyncope. 2. Hypotension leading to presyncope. 3. Acute renal failure, improving. 4. History of bilateral pulmonary emboli in November 2010. 5. 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 6. History of foramen ovale. 7. History of hypertension. 8. History of dyslipidemia. 9. Iron deficiency anemia.  DISCHARGE MEDICATIONS: 1. Iron sulfate 325 mg p.o. b.i.d. with meal. 2. Plavix 75 mg p.o. daily.  CONSULTANTS:  None.  PROCEDURES:  None.  DIAGNOSTIC STUDIES: 1. Chest x-ray 2-view done on admission which shows no active     cardiopulmonary disease. 2. CT head without contrast on admission which shows stable multiple     chronic cortical infarcts.  No acute intracranial findings     demonstrated.  PRIMARY CARE PHYSICIAN:  Betti D. Pecola Leisure, MD.  Office number is [phone], fax number is [phone].  CODE STATUS:  Full code.  ALLERGIES: 1. DARVON. 2. ASPIRIN.  CHIEF COMPLAINT:  Dizziness.  HISTORY OF PRESENT ILLNESS:  Please refer to the H and P dictated by Dr. Jomarie Longs for the details of the HPI.  However in short, [name] is a 75 year old African American female who on the day of admission awoke that morning with complaints of dizziness.  The patient states that she thought like she was going to pass out.  EMS was called at the scene. They found her systolic blood pressures to be around 77.  She was brought to the emergency room after some fluid resuscitation and her blood  pressures in the emergency room were 99 systolic.  The patient was given IV fluid resuscitation and was referred to [facility] for further evaluation and management.  HOSPITAL COURSE: 1. Hypotension.  The patient reported that she had taken some     medication and had been given samples of medication by primary care     physician.  The patient however was unable to give details of it     and the patient's daughter was not able to offer any further     information regarding this.  Given the weekend, we were unable to     get the information from her primary care physician, however,     obtained today with the fact that the patient had been on     Benicar/hydrochlorothiazide, Cardizem CD 120 mg, and a new     medication that was given with systolic.  I have spoken with Dr.     Pecola Leisure her primary care physician and given the fact that her blood     pressure was just moderately elevated, we will hold off on giving     any medications today.  The patient will have home health nursing     see her tomorrow and  check her blood pressure, and they will call     the results to Dr. Pecola Leisure at [phone] and she will give them     further instructions on medications. 2. Acute renal failure.  The patient was found to be in acute renal     failure.  This was felt to be likely secondary to the hypotensive     insult to her kidney.  It is unclear as to what her baseline     creatinine was.  However, in speaking with her primary care     physician, she reports that her creatinine within the normal     ranges.  The patient's creatinine has been improving with hydration     and with removing blood pressure medications.  At the time of     discharge, the patient had a creatinine of 1.27, which is almost     back to normal. 3. The patient will follow up with Dr. Pecola Leisure in the office on Thursday     at which time it would be appropriate for her to have her kidney     function evaluated. 4. Bilateral  pulmonary emboli.  The patient in our records had a     history of bilateral pulmonary emboli.  The medication list that     the family brought included Coumadin on the list.  The patient was     found to be subtherapeutic during this hospitalization, was     replaced on her Coumadin with titration to bring it to therapeutic     range.  I spoke with Dr. Pecola Leisure today who states that the patient     has not been on Coumadin and that is not part of her current     medication list, thus the Coumadin will be discontinued.  I     discussed with Dr. Pecola Leisure whether or not this had been discontinued     after her pulmonary emboli, and she indicates that the patient has     not been on Coumadin for quite some time and would not recommend to     replace her on Coumadin at this juncture.  Thus, the Coumadin will     be discontinued. 5. In terms of previous CVAs, the patient has an allergy to ASPIRIN,     apparently was on Plavix 75 mg.  This will be continued at the time     of discharge on this patient.  GENERAL:  At the time of discharge, the patient is stable. VITAL SIGNS:  Heart rate is 74, blood pressure 156/70, temperature 98, respiratory rate 18, O2 sats 99% on room air. HEENT:  She is normocephalic, atraumatic.  Pupils are equally round and reactive to light and accommodation.  Extraocular movements are intact. Oropharynx is moist.  No exudate, erythema, or lesions are noted. NECK:  Trachea is midline.  No masses, no thyromegaly, no JVD, no carotid bruit. NEUROLOGIC:  The patient had a residual right hemiparesis.  I spoke with the patient's daughter who states that the patient's is at baseline for her function at this time.  However, please note that the daughter has been rather unreliable in giving information, and thus we have asked Physical Therapy to see the patient in evaluation.  They recommended that the patient has a home evaluation by Home Physical Therapy and has been ordered  through [facility]. EXTREMITIES:  Show no clubbing, cyanosis or edema. PSYCHIATRIC:  She is alert and oriented x3.  She is  dysarthric, but she has good cognition inside her.  Recent memory appears to be somewhat impaired, but her remote memory is intact.  At this time, the patient is stable for discharge.  DIETARY RESTRICTIONS:  The patient should be on low-salt, heart-healthy diet.  PHYSICAL RESTRICTIONS:  Under the care physical therapy.  FOLLOWUP:  The patient is to follow up with Dr. Pecola Leisure and Dr. Haskel Schroeder office will call to schedule an appointment for Thursday.  Total time for this discharge summary including face-to-face time, 40 minutes approximately.     Altha Harm, MD     MAM/MEDQ  D:  07/25/2010  T:  07/26/2010  Job:  [id number]  cc:   Jocelyn Lamer D. Pecola Leisure, M.D.  Electronically Signed by Marthann Schiller MD on 08/11/2010 12:25:10 PM

## 2010-08-25 NOTE — H&P (Signed)
[name], [name]              ACCOUNT NO.:  [id number]  MEDICAL RECORD NO.:  [id number]  LOCATION:  4743                         FACILITY:  [facility]  PHYSICIAN:  Zannie Cove, MD     DATE OF BIRTH:  09-01-1935  DATE OF ADMISSION:  07/22/2010 DATE OF DISCHARGE:                             HISTORY & PHYSICAL   PRIMARY CARE PHYSICIAN:  Betti D. Pecola Leisure, MD  CHIEF COMPLAINT:  Dizziness.  HISTORY OF PRESENT ILLNESS:  [name] is a 75 year old African American female with history of CVA with residual hemiplegia and dysarthria as well as PE, presents to the hospital with dizziness.  The patient is a very poor historian and has no family at the bed available at this time to corroborate her history.  However, her only complaint was that yesterday she was feeling weak, not have any other symptoms, went to bed, woke up this morning started feeling dizzy and also experienced some diaphoresis.  Subsequently EMS was called on evaluation, they found that her systolic blood pressure was 70 and CBG was 77.  She was given some IV fluids and sent to the emergency room. At this time, the patient is unable to provide me with any normal meaningful history.  However, she does report that she was started on some new blood pressure medicines sometime this month per her PCP.  She denies any nausea, vomiting, dysuria, dysarthria, diarrhea, cough, congestion, shortness of breath, etc.  She denies any worsening of her weakness.  She denies shortness of breath.  She denies hemoptysis.  Her blood pressure has come up to low 100 with hydration with normal saline and [facility] were consulted for evaluation and further management.  PAST MEDICAL HISTORY: 1. History of bilateral PE in November 2010, on Coumadin. 2. History of TIA. 3. History of multiple CVA with residual dysarthria and right     hemiplegia. 4. Chronic kidney disease stage III. 5. History of small foramen ovale. 6.  Hypertension. 7. Dyslipidemia. 8. History of total colectomy with ileorectal anastomosis. 9. History of B12 deficiency. 10.History of diverticulitis.  SURGICAL HISTORY:  Significant for total abdominal hysterectomy and bilateral salpingo-oophorectomy and colectomy for diverticulitis in 2010.  SOCIAL HISTORY:  No history of alcohol or tobacco use.  Lives at home with her daughter.  FAMILY HISTORY:  Positive for diabetes.  ALLERGIES:  ASPIRIN.  CURRENT MEDICATIONS:  Unknown yet to be confirmed when the daughter arrives.  REVIEW OF SYSTEMS:  Negative except per HPI.  PHYSICAL EXAMINATION:  VITAL SIGNS:  Temperature is 98.2, pulse 70, blood pressure is 103/54, respirations 18, satting 100% on 2 liters. Her orthostatics are negative now after 2 liters, however, they were not done prior to rehydration. GENERAL:  This is an elderly-appearing African American female, lying in the stretcher, no acute distress. HEENT:  Pupils are 4 mm and reactive to light.  Extraocular movements intact.  Oral mucosa is dry. NECK:  No JVD or lymphadenopathy. CARDIOVASCULAR SYSTEM:  S1 and S2.  Regular rate and rhythm. LUNGS:  Clear to auscultation bilaterally. ABDOMEN:  Soft, nontender.  Positive bowel sounds.  No organomegaly.  No CVA tenderness. EXTREMITIES:  No edema, clubbing, or cyanosis. NEURO:  There  is residual right hemiparesis.  Strength in the right lower extremity is 3-4/5 compared to 4-5/5 in all other extremities. Plantars are downgoing.  REVIEW OF LABORATORY DATA:  White count of 10.4, hemoglobin 12.6, and platelets 163.  Chemistries shows mild left shift, neutrophils of 86, INR is 1.0.  Chemistries, sodium 137, potassium 4.2, chloride 103, bicarb 25, BUN 46, creatinine 2.1, glucose 147, and albumin 3.1. Cardiac markers x1 negative.  UA is unremarkable.  REVIEW OF CHEST X-RAY:  No active cardiopulmonary disease.  ASSESSMENT AND PLAN:  A 75 year old female with; 1. Presyncope,  dizziness. 2. Hypotension likely secondary to dehydration versus related to her     antihypertensives. 3. Acute renal failure, on chronic kidney disease. 4. History of pulmonary embolism with some therapeutic INR. 5. History of cerebrovascular accident with residual hemiplegia and     dysarthria.  We will admit her to a telemetry bed.  We will hold     her antihypertensives, rehydrate her with normal saline.  Check     cardiac markers and CT of her head.  Once we confirm that her CT of     her head is unremarkable, we will go ahead and restart full dose     anticoagulation for her history of bilateral PE with Lovenox and     Coumadin, need to evaluate the daughter regarding her new     hypertensives whether she could have been on an ACE inhibitor or     diuretic which caused her to be dehydrated with worsening renal     failure and also need to discuss compliance with about Coumadin     with her daughter as well.  I tried to contact the daughter, the     telephone number was unable to get a response.  We will try again     later.     Zannie Cove, MD     PJ/MEDQ  D:  07/22/2010  T:  07/22/2010  Job:  [id number]  cc:   Jocelyn Lamer D. Pecola Leisure, M.D.  Electronically Signed by Zannie Cove  on 08/25/2010 03:24:23 PM

## 2010-10-21 LAB — DIFFERENTIAL
Basophils Relative: 1
Eosinophils Absolute: 0.1
Eosinophils Relative: 1
Monocytes Absolute: 0.5
Monocytes Relative: 7

## 2010-10-21 LAB — CBC
Hemoglobin: 12
MCHC: 32.7
MCV: 84.7
RBC: 4.33

## 2010-10-21 LAB — POCT I-STAT, CHEM 8
BUN: 32 — ABNORMAL HIGH
Calcium, Ion: 1.13
Creatinine, Ser: 1.9 — ABNORMAL HIGH
TCO2: 26

## 2010-10-24 LAB — POCT I-STAT, CHEM 8
Glucose, Bld: 100 — ABNORMAL HIGH
HCT: 38
Hemoglobin: 12.9
Potassium: 4
TCO2: 24

## 2010-10-24 LAB — URINE MICROSCOPIC-ADD ON

## 2010-10-24 LAB — URINALYSIS, ROUTINE W REFLEX MICROSCOPIC
Glucose, UA: NEGATIVE
Hgb urine dipstick: NEGATIVE
Ketones, ur: NEGATIVE
Nitrite: NEGATIVE
pH: 5

## 2010-10-24 LAB — DIFFERENTIAL
Basophils Relative: 1
Eosinophils Absolute: 0.1
Monocytes Relative: 7
Neutrophils Relative %: 58

## 2010-10-24 LAB — CBC
MCHC: 32.5
MCV: 84.4
Platelets: 254
RBC: 4.35

## 2010-10-24 LAB — APTT: aPTT: 26

## 2010-11-28 ENCOUNTER — Encounter: Payer: Self-pay | Admitting: *Deleted

## 2010-11-28 ENCOUNTER — Other Ambulatory Visit: Payer: Self-pay

## 2010-11-28 ENCOUNTER — Inpatient Hospital Stay (HOSPITAL_COMMUNITY)
Admission: EM | Admit: 2010-11-28 | Discharge: 2010-12-01 | DRG: 069 | Disposition: A | Discharge status: Home / Self Care | Payer: PRIVATE HEALTH INSURANCE | Attending: Internal Medicine | Admitting: Internal Medicine

## 2010-11-28 ENCOUNTER — Emergency Department (HOSPITAL_COMMUNITY): Payer: PRIVATE HEALTH INSURANCE

## 2010-11-28 DIAGNOSIS — Q2111 Secundum atrial septal defect: Secondary | ICD-10-CM | POA: Diagnosis present

## 2010-11-28 DIAGNOSIS — I69922 Dysarthria following unspecified cerebrovascular disease: Secondary | ICD-10-CM

## 2010-11-28 DIAGNOSIS — G459 Transient cerebral ischemic attack, unspecified: Principal | ICD-10-CM

## 2010-11-28 DIAGNOSIS — N289 Disorder of kidney and ureter, unspecified: Secondary | ICD-10-CM

## 2010-11-28 DIAGNOSIS — J45909 Unspecified asthma, uncomplicated: Secondary | ICD-10-CM

## 2010-11-28 DIAGNOSIS — I10 Essential (primary) hypertension: Secondary | ICD-10-CM

## 2010-11-28 DIAGNOSIS — N179 Acute kidney failure, unspecified: Secondary | ICD-10-CM | POA: Diagnosis present

## 2010-11-28 DIAGNOSIS — Z8673 Personal history of transient ischemic attack (TIA), and cerebral infarction without residual deficits: Secondary | ICD-10-CM | POA: Diagnosis present

## 2010-11-28 DIAGNOSIS — N390 Urinary tract infection, site not specified: Secondary | ICD-10-CM

## 2010-11-28 DIAGNOSIS — Z23 Encounter for immunization: Secondary | ICD-10-CM

## 2010-11-28 DIAGNOSIS — M129 Arthropathy, unspecified: Secondary | ICD-10-CM | POA: Diagnosis present

## 2010-11-28 DIAGNOSIS — I69959 Hemiplegia and hemiparesis following unspecified cerebrovascular disease affecting unspecified side: Secondary | ICD-10-CM

## 2010-11-28 DIAGNOSIS — D509 Iron deficiency anemia, unspecified: Secondary | ICD-10-CM | POA: Diagnosis present

## 2010-11-28 DIAGNOSIS — I6350 Cerebral infarction due to unspecified occlusion or stenosis of unspecified cerebral artery: Secondary | ICD-10-CM

## 2010-11-28 DIAGNOSIS — Q211 Atrial septal defect: Secondary | ICD-10-CM | POA: Diagnosis present

## 2010-11-28 DIAGNOSIS — E860 Dehydration: Secondary | ICD-10-CM | POA: Diagnosis present

## 2010-11-28 DIAGNOSIS — G9340 Encephalopathy, unspecified: Secondary | ICD-10-CM | POA: Diagnosis present

## 2010-11-28 DIAGNOSIS — Z86718 Personal history of other venous thrombosis and embolism: Secondary | ICD-10-CM

## 2010-11-28 DIAGNOSIS — D649 Anemia, unspecified: Secondary | ICD-10-CM | POA: Diagnosis present

## 2010-11-28 DIAGNOSIS — I639 Cerebral infarction, unspecified: Secondary | ICD-10-CM | POA: Diagnosis present

## 2010-11-28 DIAGNOSIS — Z7902 Long term (current) use of antithrombotics/antiplatelets: Secondary | ICD-10-CM

## 2010-11-28 HISTORY — DX: Major depressive disorder, single episode, unspecified: F32.9

## 2010-11-28 HISTORY — DX: Essential (primary) hypertension: I10

## 2010-11-28 HISTORY — DX: Unspecified osteoarthritis, unspecified site: M19.90

## 2010-11-28 HISTORY — DX: Cerebral infarction, unspecified: I63.9

## 2010-11-28 HISTORY — DX: Depression, unspecified: F32.A

## 2010-11-28 LAB — URINE MICROSCOPIC-ADD ON

## 2010-11-28 LAB — CBC
HCT: 37.3 % (ref 36.0–46.0)
Hemoglobin: 11.9 g/dL — ABNORMAL LOW (ref 12.0–15.0)
RBC: 4.37 MIL/uL (ref 3.87–5.11)
WBC: 4.5 10*3/uL (ref 4.0–10.5)

## 2010-11-28 LAB — URINALYSIS, ROUTINE W REFLEX MICROSCOPIC
Ketones, ur: NEGATIVE mg/dL
Nitrite: NEGATIVE
Specific Gravity, Urine: 1.017 (ref 1.005–1.030)
Urobilinogen, UA: 0.2 mg/dL (ref 0.0–1.0)
pH: 5 (ref 5.0–8.0)

## 2010-11-28 LAB — POCT I-STAT, CHEM 8
Chloride: 108 mEq/L (ref 96–112)
Glucose, Bld: 103 mg/dL — ABNORMAL HIGH (ref 70–99)
HCT: 38 % (ref 36.0–46.0)
Potassium: 4 mEq/L (ref 3.5–5.1)
Sodium: 145 mEq/L (ref 135–145)

## 2010-11-28 LAB — BASIC METABOLIC PANEL
Chloride: 110 mEq/L (ref 96–112)
GFR calc Af Amer: 31 mL/min — ABNORMAL LOW (ref >90)
GFR calc non Af Amer: 26 mL/min — ABNORMAL LOW (ref >90)
Potassium: 4.1 mEq/L (ref 3.5–5.1)

## 2010-11-28 LAB — PROTIME-INR
INR: 1.08 (ref 0.00–1.49)
Prothrombin Time: 14.2 seconds (ref 11.6–15.2)

## 2010-11-28 LAB — APTT: aPTT: 26 seconds (ref 24–37)

## 2010-11-28 MED ORDER — DEXTROSE 5 % IV SOLN
INTRAVENOUS | Status: AC
  Filled 2010-11-28: qty 50

## 2010-11-28 MED ORDER — CEFTRIAXONE SODIUM 1 G IJ SOLR
1.0000 g | Freq: Once | INTRAMUSCULAR | Status: AC
  Administered 2010-11-28: 1 g via INTRAMUSCULAR

## 2010-11-28 NOTE — ED Notes (Signed)
Report received, assumed care of pt at this time and 1st contact with pt. Pt is resting quietly no s/s of any pain or distress noted. Pt denies any pain or complaints at this time. Pt has mild slurred speech and states "yes" when asked if speech in normal. Plan of care is updated with verbal understanding, pt is awaiting test results, will continue to monitor pt.

## 2010-11-28 NOTE — ED Notes (Signed)
Family at bedside. 

## 2010-11-28 NOTE — ED Notes (Signed)
Pt informed daughter that she woke up at 11am and felt weakness on right arm and leg.  Daughter found out at 1515 today.  Symptoms now resolved.  Pt has hx of 2 stroke with right sided deficits and slurred speech.  cbg 226.  VSS.

## 2010-11-28 NOTE — ED Notes (Signed)
After EKG obtained, pt. Placed back on monitor in PT05

## 2010-11-28 NOTE — H&P (Signed)
[name] is an 75 y.o. female.   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 The clinical features started of at 11 AM . Her clinical features have improved and states she is back to base line. Thus patietn was not a candidate for TPA. CT Head does not show anything acute and monitor shows sinus rhythm. In addition patient has UTI. Patient has not lost consciousness or has no blurred vision and had no difficulty in the other extremities.  Past Medical History  Diagnosis Date  . Hypertension   . Stroke     tias, 2 strokes with right sided deficits and slurred speech    No past surgical history on file.  No family history on file. Social History:  reports that she has never smoked. She does not have any smokeless tobacco history on file. She reports that she does not drink alcohol or use illicit drugs.  Allergies:  Allergies  Allergen Reactions  . Aspirin     Medications Prior to Admission  Medication Dose Route Frequency Provider Last Rate Last Dose  . cefTRIAXone (ROCEPHIN) injection 1 g  1 g Intramuscular Once Ethelda Chick, MD       No current outpatient prescriptions on file as of 11/28/2010.    Results for orders placed during the hospital encounter of 11/28/10 (from the past 48 hour(s))  CBC     Status: Abnormal   Collection Time   11/28/10  5:09 PM      Component Value Range Comment   WBC 4.5  4.0 - 10.5 (K/uL)    RBC 4.37  3.87 - 5.11 (MIL/uL)    Hemoglobin 11.9 (*) 12.0 - 15.0 (g/dL)    HCT 56.2  13.0 - 86.5 (%)    MCV 85.4  78.0 - 100.0 (fL)    MCH 27.2  26.0 - 34.0 (pg)    MCHC 31.9  30.0 - 36.0 (g/dL)    RDW 78.4  69.6 - 29.5 (%)    Platelets 159  150 - 400 (K/uL)     APTT     Status: Normal   Collection Time   11/28/10  5:09 PM      Component Value Range Comment   aPTT 26  24 - 37 (seconds)   PROTIME-INR     Status: Normal   Collection Time   11/28/10  5:09 PM      Component Value Range Comment   Prothrombin Time 14.2  11.6 - 15.2 (seconds)    INR 1.08  0.00 - 1.49    BASIC METABOLIC PANEL     Status: Abnormal   Collection Time   11/28/10  5:09 PM      Component Value Range Comment   Sodium 143  135 - 145 (mEq/L)    Potassium 4.1  3.5 - 5.1 (mEq/L)    Chloride 110  96 - 112 (mEq/L)    CO2 27  19 - 32 (mEq/L)    Glucose, Bld 102 (*) 70 - 99 (mg/dL)    BUN 19  6 - 23 (mg/dL)    Creatinine, Ser 2.84 (*) 0.50 - 1.10 (mg/dL)    Calcium 8.9  8.4 - 10.5 (mg/dL)    GFR calc non Af  Amer 26 (*) >90 (mL/min)    GFR calc Af Amer 31 (*) >90 (mL/min)   GLUCOSE, CAPILLARY     Status: Abnormal   Collection Time   11/28/10  5:20 PM      Component Value Range Comment   Glucose-Capillary 138 (*) 70 - 99 (mg/dL)   URINALYSIS, ROUTINE W REFLEX MICROSCOPIC     Status: Abnormal   Collection Time   11/28/10  5:24 PM      Component Value Range Comment   Color, Urine YELLOW  YELLOW     Appearance HAZY (*) CLEAR     Specific Gravity, Urine 1.017  1.005 - 1.030     pH 5.0  5.0 - 8.0     Glucose, UA NEGATIVE  NEGATIVE (mg/dL)    Hgb urine dipstick NEGATIVE  NEGATIVE     Bilirubin Urine NEGATIVE  NEGATIVE     Ketones, ur NEGATIVE  NEGATIVE (mg/dL)    Protein, ur NEGATIVE  NEGATIVE (mg/dL)    Urobilinogen, UA 0.2  0.0 - 1.0 (mg/dL)    Nitrite NEGATIVE  NEGATIVE     Leukocytes, UA MODERATE (*) NEGATIVE    URINE MICROSCOPIC-ADD ON     Status: Abnormal   Collection Time   11/28/10  5:24 PM      Component Value Range Comment   Squamous Epithelial / LPF MANY (*) RARE     WBC, UA 11-20  <3 (WBC/hpf)    Bacteria, UA FEW (*) RARE     Urine-Other MUCOUS PRESENT   MANY YEAST  POCT I-STAT, CHEM 8     Status: Abnormal   Collection Time   11/28/10  5:29 PM      Component  Value Range Comment   Sodium 145  135 - 145 (mEq/L)    Potassium 4.0  3.5 - 5.1 (mEq/L)    Chloride 108  96 - 112 (mEq/L)    BUN 21  6 - 23 (mg/dL)    Creatinine, Ser 9.60 (*) 0.50 - 1.10 (mg/dL)    Glucose, Bld 454 (*) 70 - 99 (mg/dL)    Calcium, Ion 0.98  1.12 - 1.32 (mmol/L)    TCO2 26  0 - 100 (mmol/L)    Hemoglobin 12.9  12.0 - 15.0 (g/dL)    HCT 11.9  14.7 - 82.9 (%)   GLUCOSE, CAPILLARY     Status: Normal   Collection Time   11/28/10  6:28 PM      Component Value Range Comment   Glucose-Capillary 91  70 - 99 (mg/dL)    Ct Head Wo Contrast  11/28/2010  *RADIOLOGY REPORT*  Clinical Data: Right-sided motor deficit.  Slurred speech.  CT HEAD WITHOUT CONTRAST  Technique:  Contiguous axial images were obtained from the base of the skull through the vertex without contrast.  Comparison: 07/22/2010  Findings: Multifocal areas of encephalomalacia involving the supratentorial cortex are stable.  No new suspicious low density is present to suggest acute ischemic change.  Low density in the right side the pons is felt to be due to beam hardening artifact.  No mass effect, midline shift, or acute intracranial hemorrhage. Calvarium is intact.  IMPRESSION: Extensive chronic ischemic changes.  No definite acute intracranial pathology.  Original Report Authenticated By: Donavan Burnet, M.D.    Review of Systems  Constitutional: Negative.   HENT: Negative.   Eyes: Negative.   Respiratory: Negative.   Cardiovascular: Negative.   Gastrointestinal: Negative.   Genitourinary: Negative.   Musculoskeletal: Negative.  Skin: Negative.   Neurological: Positive for focal weakness.  Endo/Heme/Allergies: Negative.   Psychiatric/Behavioral: Negative.     Blood pressure 182/78, pulse 59, temperature 97.1 F (36.2 C), temperature source Oral, resp. rate 18, SpO2 100.00%. Physical Exam  Constitutional: She is oriented to person, place, and time. She appears well-developed and well-nourished.  HENT:    Head: Normocephalic and atraumatic.  Eyes: Conjunctivae and EOM are normal. Pupils are equal, round, and reactive to light.  Neck: Normal range of motion. Neck supple.  Cardiovascular: Normal rate and regular rhythm.   Respiratory: Effort normal and breath sounds normal.  GI: Soft. Bowel sounds are normal.  Neurological: She is alert and oriented to person, place, and time. No cranial nerve deficit or sensory deficit. She displays a negative Romberg sign.       Patient has known h/o of right upper extremity weakness from previous stroke and also has dysarthria.     Assessment/Plan 1)CVA vs TIA 2)UTI 3)H/O HT 4)H/O Patent foramen ovale 5)H/O PE 6)H/O previous CVA with residual right hemiplegia and dysarthria.  Plan : Admit to telemetry Get MRI/MRA Brain, carotid doppler and 2D Echo Given the h/o patent foramen ovale may require TEE but Dr Marylou Flesher from neuro is going to see the patient in consult will wait for their recommendations. For UTI will continue ceftriaxone and follow urine cultures.  Eduard Clos 11/28/2010, 10:29 PM

## 2010-11-28 NOTE — ED Notes (Signed)
CBG #2 obtained at 18:30 and was 91 mg/dL

## 2010-11-28 NOTE — ED Provider Notes (Signed)
History     CSN: [id number] Arrival date & time: 11/28/2010  4:06 PM   First MD Initiated Contact with Patient 11/28/10 1624      Chief Complaint  Patient presents with  . Extremity Weakness    (Consider location/radiation/quality/duration/timing/severity/associated sxs/prior treatment) HPI Patient presents with a complaint of feeling that her right upper extremity was weak. She states that she awoke with this feeling this morning approximately 11 AM. She has a history of prior strokes and has had weakness in this arm in the past however this was more than what she is used to. She states over the next several hours that the weakness in her arm improved and now it is at its baseline. She denies any weakness in her legs. She does have some slurred speech but she states this is normal since her prior strokes. She denies any changes in vision. No recent illness, fever, vomiting, shortness of breath.  Past Medical History  Diagnosis Date  . Hypertension   . Stroke     tias, 2 strokes with right sided deficits and slurred speech    No past surgical history on file.  No family history on file.  History  Substance Use Topics  . Smoking status: Never Smoker   . Smokeless tobacco: Not on file  . Alcohol Use: No    OB History    Grav Para Term Preterm Abortions TAB SAB Ect Mult Living                  Review of Systems ROS reviewed and otherwise negative except for mentioned in HPI  Allergies  Aspirin  Home Medications   Current Outpatient Rx  Name Route Sig Dispense Refill  . CLOPIDOGREL BISULFATE 75 MG PO TABS Oral Take 75 mg by mouth daily.      Marland Kitchen FERROUS SULFATE 325 (65 FE) MG PO TABS Oral Take 325 mg by mouth 2 (two) times daily with a meal.      . METOPROLOL TARTRATE 25 MG PO TABS Oral Take 25 mg by mouth 2 (two) times daily.        BP 174/70  Pulse 58  Temp(Src) 97.1 F (36.2 C) (Oral)  Resp 19  SpO2 100% Vitals reviewed Physical Exam Physical Examination:  General appearance - alert, well appearing, and in no distress Mental status - alert, oriented to person, place, and time Eyes - pupils equal and reactive, extraocular eye movements intact Mouth - mucous membranes moist, pharynx normal without lesions Chest - clear to auscultation, no wheezes, rales or rhonchi, symmetric air entry Heart - normal rate, regular rhythm, normal S1, S2, no murmurs, rubs, clicks or gallops Abdomen - soft, nontender, nondistended, no masses or organomegaly Neurological - alert and oriented, cranial nerves 2-12 tested and intact with the exception of slurred speech- which is chronic per patient, strength 5/5 in extrmeties x 4, sensation intact Musculoskeletal - no joint tenderness, deformity or swelling Extremities - peripheral pulses normal, no pedal edema, no clubbing or cyanosis Skin - normal coloration and turgor, no rashes, no suspicious skin lesions noted  ED Course  Procedures (including critical care time)  Labs Reviewed  CBC - Abnormal; Notable for the following:    Hemoglobin 11.9 (*)    All other components within normal limits  BASIC METABOLIC PANEL - Abnormal; Notable for the following:    Glucose, Bld 102 (*)    Creatinine, Ser 1.82 (*)    GFR calc non Af Amer 26 (*)  GFR calc Af Amer 31 (*)    All other components within normal limits  URINALYSIS, ROUTINE W REFLEX MICROSCOPIC - Abnormal; Notable for the following:    Appearance HAZY (*)    Leukocytes, UA MODERATE (*)    All other components within normal limits  GLUCOSE, CAPILLARY - Abnormal; Notable for the following:    Glucose-Capillary 138 (*)    All other components within normal limits  POCT I-STAT, CHEM 8 - Abnormal; Notable for the following:    Creatinine, Ser 1.70 (*)    Glucose, Bld 103 (*)    All other components within normal limits  URINE MICROSCOPIC-ADD ON - Abnormal; Notable for the following:    Squamous Epithelial / LPF MANY (*)    Bacteria, UA FEW (*)    All other  components within normal limits  APTT  PROTIME-INR  GLUCOSE, CAPILLARY  I-STAT, CHEM 8  CBC  POCT CBG MONITORING   No results found.   No diagnosis found.   Date: 11/28/2010  Rate: 62  Rhythm: SR with first degree AV block  QRS Axis: normal  Intervals: PR prolonged  ST/T Wave abnormalities: normal  Conduction Disutrbances:first degree AV block  Narrative Interpretation:   Old EKG Reviewed: compared to changes of 07/22/10 no significant changes noted  9:25 PM  D/w [facility], Dr. Toniann Fail- pt to be admitted to [facility], Team 1, [place]   MDM  Pt presenting with RUE weakness- present when she awoke this morning, but has resolved upon ED evaluation.  Pt has chronic slurred speech due to prior strokes.  Head CT reveals chronic ischemic changes.  Urine c/w UTI.  Also some worsening renal insufficiency compared to her baseline.  Pt admitted to [facility] for TIA eval, started on rocephin for UTI.          Ethelda Chick, MD 11/28/10 [phone]

## 2010-11-28 NOTE — ED Notes (Signed)
Slight slurred speech noted, no facial drooping, arm drift noted. Pt alert and orientedx3, good strength in all extremities, pt is on cardiac monitor, vitals stable, waiting for additional testing

## 2010-11-28 NOTE — ED Notes (Signed)
Report given to RN Shartia 3000, plan of care is udpated with family and pt. Pt is awaiting meds to be given and transport to inpt bed assignment. Will continue to monitor pt.

## 2010-11-29 ENCOUNTER — Inpatient Hospital Stay (HOSPITAL_COMMUNITY): Payer: PRIVATE HEALTH INSURANCE

## 2010-11-29 ENCOUNTER — Encounter (HOSPITAL_COMMUNITY): Payer: Self-pay | Admitting: *Deleted

## 2010-11-29 DIAGNOSIS — G459 Transient cerebral ischemic attack, unspecified: Secondary | ICD-10-CM

## 2010-11-29 LAB — COMPREHENSIVE METABOLIC PANEL
Alkaline Phosphatase: 55 U/L (ref 39–117)
BUN: 21 mg/dL (ref 6–23)
CO2: 24 mEq/L (ref 19–32)
Chloride: 109 mEq/L (ref 96–112)
GFR calc Af Amer: 40 mL/min — ABNORMAL LOW (ref >90)
GFR calc non Af Amer: 35 mL/min — ABNORMAL LOW (ref >90)
Glucose, Bld: 79 mg/dL (ref 70–99)
Potassium: 3.6 mEq/L (ref 3.5–5.1)
Total Bilirubin: 0.2 mg/dL — ABNORMAL LOW (ref 0.3–1.2)

## 2010-11-29 LAB — CBC
HCT: 36.4 % (ref 36.0–46.0)
Hemoglobin: 11.7 g/dL — ABNORMAL LOW (ref 12.0–15.0)
MCHC: 32.1 g/dL (ref 30.0–36.0)
WBC: 3.9 10*3/uL — ABNORMAL LOW (ref 4.0–10.5)

## 2010-11-29 LAB — MAGNESIUM: Magnesium: 2 mg/dL (ref 1.5–2.5)

## 2010-11-29 LAB — HEMOGLOBIN A1C: Hgb A1c MFr Bld: 6.1 % — ABNORMAL HIGH (ref <5.7)

## 2010-11-29 LAB — LIPID PANEL
LDL Cholesterol: 127 mg/dL — ABNORMAL HIGH (ref 0–99)
Triglycerides: 133 mg/dL (ref <150)

## 2010-11-29 MED ORDER — DEXTROSE 5 % IV SOLN
1.0000 g | Freq: Every day | INTRAVENOUS | Status: DC
  Administered 2010-11-29: 1 g via INTRAVENOUS
  Filled 2010-11-29 (×3): qty 10

## 2010-11-29 MED ORDER — SENNOSIDES-DOCUSATE SODIUM 8.6-50 MG PO TABS: 1.0000 | ORAL_TABLET | Freq: Every evening | ORAL | Status: DC | PRN

## 2010-11-29 MED ORDER — ENOXAPARIN SODIUM 30 MG/0.3ML ~~LOC~~ SOLN
30.0000 mg | SUBCUTANEOUS | Status: DC
  Administered 2010-11-29 – 2010-11-30 (×2): 30 mg via SUBCUTANEOUS
  Filled 2010-11-29 (×3): qty 0.3

## 2010-11-29 MED ORDER — CLOPIDOGREL BISULFATE 75 MG PO TABS
75.0000 mg | ORAL_TABLET | Freq: Every day | ORAL | Status: DC
  Administered 2010-11-30 – 2010-12-01 (×2): 75 mg via ORAL
  Filled 2010-11-29 (×3): qty 1

## 2010-11-29 MED ORDER — FERROUS SULFATE 325 (65 FE) MG PO TABS
325.0000 mg | ORAL_TABLET | Freq: Two times a day (BID) | ORAL | Status: DC
  Administered 2010-11-29 – 2010-12-01 (×5): 325 mg via ORAL
  Filled 2010-11-29 (×7): qty 1

## 2010-11-29 MED ORDER — SODIUM CHLORIDE 0.9 % IV SOLN
INTRAVENOUS | Status: DC
  Administered 2010-11-29 – 2010-11-30 (×2): via INTRAVENOUS

## 2010-11-29 MED ORDER — METOPROLOL TARTRATE 25 MG PO TABS
25.0000 mg | ORAL_TABLET | Freq: Two times a day (BID) | ORAL | Status: DC
  Administered 2010-11-29 – 2010-12-01 (×5): 25 mg via ORAL
  Filled 2010-11-29 (×8): qty 1

## 2010-11-29 NOTE — Progress Notes (Signed)
*  PRELIMINARY RESULTS*  Carotid Doppler has been performed. No significant ICA stenosis. Antegrade vertebral flow.  Farrel Demark 11/29/2010, 2:50 PM

## 2010-11-29 NOTE — Progress Notes (Signed)
Subjective: She already feels better  Physical Exam: Blood pressure 149/65, pulse 60, temperature 97.9 F (36.6 C), temperature source Oral, resp. rate 16, height 5\' 6"  (1.676 m), weight 55.2 kg (121 lb 11.1 oz), SpO2 95.00%. Alert with some dysarthria. Heart is regular. Lungs are clear. Abdomen is soft not tender. Extremities without edema.    Investigations: Results for orders placed during the hospital encounter of 11/28/10 (from the past 48 hour(s))  CBC     Status: Abnormal   Collection Time   11/28/10  5:09 PM      Component Value Range Comment   WBC 4.5  4.0 - 10.5 (K/uL)    RBC 4.37  3.87 - 5.11 (MIL/uL)    Hemoglobin 11.9 (*) 12.0 - 15.0 (g/dL)    HCT 16.1  09.6 - 04.5 (%)    MCV 85.4  78.0 - 100.0 (fL)    MCH 27.2  26.0 - 34.0 (pg)    MCHC 31.9  30.0 - 36.0 (g/dL)    RDW 40.9  81.1 - 91.4 (%)    Platelets 159  150 - 400 (K/uL)   APTT     Status: Normal   Collection Time   11/28/10  5:09 PM      Component Value Range Comment   aPTT 26  24 - 37 (seconds)   PROTIME-INR     Status: Normal   Collection Time   11/28/10  5:09 PM      Component Value Range Comment   Prothrombin Time 14.2  11.6 - 15.2 (seconds)    INR 1.08  0.00 - 1.49    BASIC METABOLIC PANEL     Status: Abnormal   Collection Time   11/28/10  5:09 PM      Component Value Range Comment   Sodium 143  135 - 145 (mEq/L)    Potassium 4.1  3.5 - 5.1 (mEq/L)    Chloride 110  96 - 112 (mEq/L)    CO2 27  19 - 32 (mEq/L)    Glucose, Bld 102 (*) 70 - 99 (mg/dL)    BUN 19  6 - 23 (mg/dL)    Creatinine, Ser 7.82 (*) 0.50 - 1.10 (mg/dL)    Calcium 8.9  8.4 - 10.5 (mg/dL)    GFR calc non Af Amer 26 (*) >90 (mL/min)    GFR calc Af Amer 31 (*) >90 (mL/min)   GLUCOSE, CAPILLARY     Status: Abnormal   Collection Time   11/28/10  5:20 PM      Component Value Range Comment   Glucose-Capillary 138 (*) 70 - 99 (mg/dL)   URINALYSIS, ROUTINE W REFLEX MICROSCOPIC     Status: Abnormal   Collection Time   11/28/10  5:24 PM      Component Value Range Comment   Color, Urine YELLOW  YELLOW     Appearance HAZY (*) CLEAR     Specific Gravity, Urine 1.017  1.005 - 1.030     pH 5.0  5.0 - 8.0     Glucose, UA NEGATIVE  NEGATIVE (mg/dL)    Hgb urine dipstick NEGATIVE  NEGATIVE     Bilirubin Urine NEGATIVE  NEGATIVE     Ketones, ur NEGATIVE  NEGATIVE (mg/dL)    Protein, ur NEGATIVE  NEGATIVE (mg/dL)    Urobilinogen, UA 0.2  0.0 - 1.0 (mg/dL)    Nitrite NEGATIVE  NEGATIVE     Leukocytes, UA MODERATE (*) NEGATIVE    URINE MICROSCOPIC-ADD ON  Status: Abnormal   Collection Time   11/28/10  5:24 PM      Component Value Range Comment   Squamous Epithelial / LPF MANY (*) RARE     WBC, UA 11-20  <3 (WBC/hpf)    Bacteria, UA FEW (*) RARE     Urine-Other MUCOUS PRESENT   MANY YEAST  POCT I-STAT, CHEM 8     Status: Abnormal   Collection Time   11/28/10  5:29 PM      Component Value Range Comment   Sodium 145  135 - 145 (mEq/L)    Potassium 4.0  3.5 - 5.1 (mEq/L)    Chloride 108  96 - 112 (mEq/L)    BUN 21  6 - 23 (mg/dL)    Creatinine, Ser 4.09 (*) 0.50 - 1.10 (mg/dL)    Glucose, Bld 811 (*) 70 - 99 (mg/dL)    Calcium, Ion 9.14  1.12 - 1.32 (mmol/L)    TCO2 26  0 - 100 (mmol/L)    Hemoglobin 12.9  12.0 - 15.0 (g/dL)    HCT 78.2  95.6 - 21.3 (%)   GLUCOSE, CAPILLARY     Status: Normal   Collection Time   11/28/10  6:28 PM      Component Value Range Comment   Glucose-Capillary 91  70 - 99 (mg/dL)   HEMOGLOBIN Y8M     Status: Abnormal   Collection Time   11/29/10  6:35 AM      Component Value Range Comment   Hemoglobin A1C 6.1 (*) <5.7 (%)    Mean Plasma Glucose 128 (*) <117 (mg/dL)   LIPID PANEL     Status: Abnormal   Collection Time   11/29/10  6:35 AM      Component Value Range Comment   Cholesterol 216 (*) 0 - 200 (mg/dL)    Triglycerides 578  <150 (mg/dL)    HDL 62  >46 (mg/dL)    Total CHOL/HDL Ratio 3.5      VLDL 27  0 - 40 (mg/dL)    LDL Cholesterol 962 (*) 0 - 99 (mg/dL)   CBC     Status: Abnormal     Collection Time   11/29/10  6:35 AM      Component Value Range Comment   WBC 3.9 (*) 4.0 - 10.5 (K/uL)    RBC 4.32  3.87 - 5.11 (MIL/uL)    Hemoglobin 11.7 (*) 12.0 - 15.0 (g/dL)    HCT 95.2  84.1 - 32.4 (%)    MCV 84.3  78.0 - 100.0 (fL)    MCH 27.1  26.0 - 34.0 (pg)    MCHC 32.1  30.0 - 36.0 (g/dL)    RDW 40.1  02.7 - 25.3 (%)    Platelets 163  150 - 400 (K/uL)   COMPREHENSIVE METABOLIC PANEL     Status: Abnormal   Collection Time   11/29/10  6:35 AM      Component Value Range Comment   Sodium 142  135 - 145 (mEq/L)    Potassium 3.6  3.5 - 5.1 (mEq/L)    Chloride 109  96 - 112 (mEq/L)    CO2 24  19 - 32 (mEq/L)    Glucose, Bld 79  70 - 99 (mg/dL)    BUN 21  6 - 23 (mg/dL)    Creatinine, Ser 6.64 (*) 0.50 - 1.10 (mg/dL)    Calcium 8.9  8.4 - 10.5 (mg/dL)    Total Protein 5.8 (*) 6.0 - 8.3 (  g/dL)    Albumin 2.9 (*) 3.5 - 5.2 (g/dL)    AST 15  0 - 37 (U/L)    ALT 9  0 - 35 (U/L)    Alkaline Phosphatase 55  39 - 117 (U/L)    Total Bilirubin 0.2 (*) 0.3 - 1.2 (mg/dL)    GFR calc non Af Amer 35 (*) >90 (mL/min)    GFR calc Af Amer 40 (*) >90 (mL/min)   MAGNESIUM     Status: Normal   Collection Time   11/29/10  6:35 AM      Component Value Range Comment   Magnesium 2.0  1.5 - 2.5 (mg/dL)   TSH     Status: Normal   Collection Time   11/29/10  6:35 AM      Component Value Range Comment   TSH 1.276  0.350 - 4.500 (uIU/mL)    No results found for this or any previous visit (from the past 240 hour(s)).  Ct Head Wo Contrast  11/28/2010  *RADIOLOGY REPORT*  Clinical Data: Right-sided motor deficit.  Slurred speech.  CT HEAD WITHOUT CONTRAST  Technique:  Contiguous axial images were obtained from the base of the skull through the vertex without contrast.  Comparison: 07/22/2010  Findings: Multifocal areas of encephalomalacia involving the supratentorial cortex are stable.  No new suspicious low density is present to suggest acute ischemic change.  Low density in the right side the pons  is felt to be due to beam hardening artifact.  No mass effect, midline shift, or acute intracranial hemorrhage. Calvarium is intact.  IMPRESSION: Extensive chronic ischemic changes.  No definite acute intracranial pathology.  Original Report Authenticated By: Donavan Burnet, M.D.      Medications: I have reviewed the patient's current medications.  Impression: UTI Principal Problem:  *TIA (transient ischemic attack) Active Problems:  ANEMIA, HYPOCHROMIC  HYPERTENSION  CVA     Plan: Plan is to follow the MRI results continue the current treatments. Await results of the urine culture. Probably a lot of her deficits might have been related to UTI.      LOS: 1 day   Dejanay Wamboldt 11/29/2010, 5:30 PM

## 2010-11-29 NOTE — Progress Notes (Signed)
*  PRELIMINARY RESULTS* Echocardiogram 2D Echocardiogram has been performed.  [facility] 11/29/2010, 11:54 AM

## 2010-11-29 NOTE — Progress Notes (Signed)
Speech Language/Pathology Speech Language Pathology Evaluation Patient Details Name: [name] MRN: [id number] DOB: December 23, 1935 Today's Date: 11/29/2010  Problem List:  Patient Active Problem List  Diagnoses  . HYPERLIPIDEMIA  . ANEMIA, HYPOCHROMIC  . HYPERTENSION  . CVA  . ASTHMA  . DIVERTICULITIS, COLON  . HYPERGLYCEMIA  . COLONIC POLYPS, ADENOMATOUS, HX OF  . TIA (transient ischemic attack)    Past Medical History:  Past Medical History  Diagnosis Date  . Hypertension   . Stroke     tias, 2 strokes with right sided deficits and slurred speech  . Arthritis     hands  . Depression    Past Surgical History:  Past Surgical History  Procedure Date  . Vascular surgery   . 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 Speech intelligibilty for phrase level was 60% without cues, and sentence level expression was 45-50%. SLP Recommendation/Assessment: Patient will need skilled Speech Lanaguage Pathology Services in the acute care venue to address identified deficits Problem List: Verbal expression Therapy Diagnosis: Dysarthria Type of Dysarthria: Ataxic Problem List Comments: speech improves when patient slows down rate, overarticulates and shortens phrases when verbally communicating Plan Speech Therapy Frequency: min 1 x/week Duration: 2 weeks Treatment/Interventions: Compensatory strategies;Patient/family education;Internal/external aids;SLP instruction and feedback Potential to Achieve Goals: Good Potential Considerations: Ability to learn/carryover information;Cooperation/participation level;Family/community support Recommendation Follow up Recommendations: Home health SLP Equipment Recommended: None recommended by SLP Individuals  Consulted Consulted and Agree with Results and Recommendations: Patient  SLP Goals     SLP Evaluation Prior Functioning  Prior Functional Status Cognitive/Linguistic Baseline: Within functional limits Type of Home: House Lives With: Daughter Receives Help From: Family Cognition Cognition Overall Cognitive Status: Appears within functional limits for tasks assessed Arousal/Alertness: Awake/alert Orientation Level: Oriented X4 Attention: Alternating Alternating Attention: Appears intact Memory: Appears intact Awareness: Appears intact Problem Solving: Appears intact Executive Function: Reasoning Reasoning: Appears intact Decision Making: Appears intact Self Monitoring: Appears intact Safety/Judgment: Appears intact Comprehension  Auditory Comprehension Yes/No Questions: Within Functional Limits Commands: Within Functional Limits Visual Recognition/Discrimination Discrimination: Within Function Limits Reading Comprehension Reading Status: Within funtional limits Expression  Expression Primary Mode of Expression: Verbal Verbal Expression Initiation: Impaired Level of Generative/Spontaneous Verbalization: Sentence;Conversation Repetition: No impairment Naming: No impairment Pragmatics: No impairment Interfering Components: Speech intelligibility;Premorbid deficit (per pt, stroke in 2004 affected speech, now its worse) Effective Techniques: Articulatory cues Non-Verbal Means of Communication: Not applicable Other Verbal Expression Comments: Patient appears with probable ataxic dysarthria, as noted by impaired diadochokinetic rate, minimal oral motor/facial flaccidity Written Expression Written Expression: Not tested Oral/Motor  Oral Motor/Sensory Function Labial ROM: Within Functional Limits Labial Strength: Reduced (mildly reduced versus premorbid from previous CVA) Labial Sensation: Within Functional Limits Lingual Symmetry: Within Functional Limits Lingual  Strength: Within Functional Limits Facial ROM: Within Functional Limits Motor Speech Intelligibility: Intelligibility reduced  Pablo Lawrence 11/29/2010, 3:54 PM

## 2010-11-29 NOTE — Progress Notes (Signed)
Reason for Consult:question stroke Referring Physician: Lonia Blood  CC: Stroke  HPI: [name] is an 75 y.o. female with histroy of bilateral strokes dating back to 2009 and 2010.  In addition patient has a documented PFO by TEE 2010.  Bilateral PE in past S/P anticoagulation.  Currently is on Plavix.  Patient was brought to hospital due to daughter noticing increased right sided weakness and increased difficulty with understanding verbal commands.  Upon admission her CT head was negative, urinalysis positive for leukocytes and yeast.  Neurology asked to see patient to further evaluate for stroke symptoms.  Past Medical History  Diagnosis Date  . Hypertension   . Stroke     tias, 2 strokes with right sided deficits and slurred speech  . Arthritis     hands  . Depression     Past Surgical History  Procedure Date  . Vascular surgery   . Abdominal hysterectomy       Social History:  reports that she has never smoked. She does not have any smokeless tobacco history on file. She reports that she does not drink alcohol or use illicit drugs.  Allergies  Allergen Reactions  . Aspirin     Medications:  Scheduled:   . cefTRIAXone (ROCEPHIN) IV  1 g Intravenous QHS  . cefTRIAXone  1 g Intramuscular Once  . clopidogrel  75 mg Oral Q breakfast  . dextrose      . enoxaparin  30 mg Subcutaneous Q24H  . ferrous sulfate  325 mg Oral BID WC  . metoprolol tartrate  25 mg Oral BID    Neurological ROS: positive for - right sided weakness and receptive difficulties with speech  Blood pressure 151/77, pulse 54, temperature 98.2 F (36.8 C), temperature source Oral, resp. rate 16, height 5\' 6"  (1.676 m), weight 55.2 kg (121 lb 11.1 oz), SpO2 99.00%.  Neurologic Examination: Mental Status: Alert, oriented, thought content appropriate.  Speech non-fluent. Able to follow 3 step commands without difficulty. Cranial Nerves: II- Visual fields grossly intact. III/IV/VI-Extraocular  movements intact.  Pupils reactive bilaterally. V/VII-Smile symmetric VIII-grossly intact IX/X-normal gag XI-bilateral shoulder shrug XII-midline tongue extension Motor: 5-/5 in the right upper extremity.  4+ to 5-/5 int he right lower extremity.  Tone slightly increased on the right. Patient tends to neglect the right aspect of body on exam with MS testing Sensory: Pinprick and light touch intact throughout but decreased on right side of body (arm and Leg) Deep Tendon Reflexes: 2+ and symmetric throughout Plantars: Downgoing bilaterally Cerebellar: Normal finger-to-nose,normal heel-to-shin test.       Results for orders placed during the hospital encounter of 11/28/10 (from the past 48 hour(s))  CBC     Status: Abnormal   Collection Time   11/28/10  5:09 PM      Component Value Range Comment   WBC 4.5  4.0 - 10.5 (K/uL)    RBC 4.37  3.87 - 5.11 (MIL/uL)    Hemoglobin 11.9 (*) 12.0 - 15.0 (g/dL)    HCT 46.9  62.9 - 52.8 (%)    MCV 85.4  78.0 - 100.0 (fL)    MCH 27.2  26.0 - 34.0 (pg)    MCHC 31.9  30.0 - 36.0 (g/dL)    RDW 41.3  24.4 - 01.0 (%)    Platelets 159  150 - 400 (K/uL)   APTT     Status: Normal   Collection Time   11/28/10  5:09 PM      Component  Value Range Comment   aPTT 26  24 - 37 (seconds)   PROTIME-INR     Status: Normal   Collection Time   11/28/10  5:09 PM      Component Value Range Comment   Prothrombin Time 14.2  11.6 - 15.2 (seconds)    INR 1.08  0.00 - 1.49    BASIC METABOLIC PANEL     Status: Abnormal   Collection Time   11/28/10  5:09 PM      Component Value Range Comment   Sodium 143  135 - 145 (mEq/L)    Potassium 4.1  3.5 - 5.1 (mEq/L)    Chloride 110  96 - 112 (mEq/L)    CO2 27  19 - 32 (mEq/L)    Glucose, Bld 102 (*) 70 - 99 (mg/dL)    BUN 19  6 - 23 (mg/dL)    Creatinine, Ser 0.45 (*) 0.50 - 1.10 (mg/dL)    Calcium 8.9  8.4 - 10.5 (mg/dL)    GFR calc non Af Amer 26 (*) >90 (mL/min)    GFR calc Af Amer 31 (*) >90 (mL/min)   GLUCOSE,  CAPILLARY     Status: Abnormal   Collection Time   11/28/10  5:20 PM      Component Value Range Comment   Glucose-Capillary 138 (*) 70 - 99 (mg/dL)   URINALYSIS, ROUTINE W REFLEX MICROSCOPIC     Status: Abnormal   Collection Time   11/28/10  5:24 PM      Component Value Range Comment   Color, Urine YELLOW  YELLOW     Appearance HAZY (*) CLEAR     Specific Gravity, Urine 1.017  1.005 - 1.030     pH 5.0  5.0 - 8.0     Glucose, UA NEGATIVE  NEGATIVE (mg/dL)    Hgb urine dipstick NEGATIVE  NEGATIVE     Bilirubin Urine NEGATIVE  NEGATIVE     Ketones, ur NEGATIVE  NEGATIVE (mg/dL)    Protein, ur NEGATIVE  NEGATIVE (mg/dL)    Urobilinogen, UA 0.2  0.0 - 1.0 (mg/dL)    Nitrite NEGATIVE  NEGATIVE     Leukocytes, UA MODERATE (*) NEGATIVE    URINE MICROSCOPIC-ADD ON     Status: Abnormal   Collection Time   11/28/10  5:24 PM      Component Value Range Comment   Squamous Epithelial / LPF MANY (*) RARE     WBC, UA 11-20  <3 (WBC/hpf)    Bacteria, UA FEW (*) RARE     Urine-Other MUCOUS PRESENT   MANY YEAST  POCT I-STAT, CHEM 8     Status: Abnormal   Collection Time   11/28/10  5:29 PM      Component Value Range Comment   Sodium 145  135 - 145 (mEq/L)    Potassium 4.0  3.5 - 5.1 (mEq/L)    Chloride 108  96 - 112 (mEq/L)    BUN 21  6 - 23 (mg/dL)    Creatinine, Ser 4.09 (*) 0.50 - 1.10 (mg/dL)    Glucose, Bld 811 (*) 70 - 99 (mg/dL)    Calcium, Ion 9.14  1.12 - 1.32 (mmol/L)    TCO2 26  0 - 100 (mmol/L)    Hemoglobin 12.9  12.0 - 15.0 (g/dL)    HCT 78.2  95.6 - 21.3 (%)   GLUCOSE, CAPILLARY     Status: Normal   Collection Time   11/28/10  6:28 PM  Component Value Range Comment   Glucose-Capillary 91  70 - 99 (mg/dL)   HEMOGLOBIN J4N     Status: Abnormal   Collection Time   11/29/10  6:35 AM      Component Value Range Comment   Hemoglobin A1C 6.1 (*) <5.7 (%)    Mean Plasma Glucose 128 (*) <117 (mg/dL)   LIPID PANEL     Status: Abnormal   Collection Time   11/29/10  6:35 AM       Component Value Range Comment   Cholesterol 216 (*) 0 - 200 (mg/dL)    Triglycerides 829  <150 (mg/dL)    HDL 62  >56 (mg/dL)    Total CHOL/HDL Ratio 3.5      VLDL 27  0 - 40 (mg/dL)    LDL Cholesterol 213 (*) 0 - 99 (mg/dL)   CBC     Status: Abnormal   Collection Time   11/29/10  6:35 AM      Component Value Range Comment   WBC 3.9 (*) 4.0 - 10.5 (K/uL)    RBC 4.32  3.87 - 5.11 (MIL/uL)    Hemoglobin 11.7 (*) 12.0 - 15.0 (g/dL)    HCT 08.6  57.8 - 46.9 (%)    MCV 84.3  78.0 - 100.0 (fL)    MCH 27.1  26.0 - 34.0 (pg)    MCHC 32.1  30.0 - 36.0 (g/dL)    RDW 62.9  52.8 - 41.3 (%)    Platelets 163  150 - 400 (K/uL)   COMPREHENSIVE METABOLIC PANEL     Status: Abnormal   Collection Time   11/29/10  6:35 AM      Component Value Range Comment   Sodium 142  135 - 145 (mEq/L)    Potassium 3.6  3.5 - 5.1 (mEq/L)    Chloride 109  96 - 112 (mEq/L)    CO2 24  19 - 32 (mEq/L)    Glucose, Bld 79  70 - 99 (mg/dL)    BUN 21  6 - 23 (mg/dL)    Creatinine, Ser 2.44 (*) 0.50 - 1.10 (mg/dL)    Calcium 8.9  8.4 - 10.5 (mg/dL)    Total Protein 5.8 (*) 6.0 - 8.3 (g/dL)    Albumin 2.9 (*) 3.5 - 5.2 (g/dL)    AST 15  0 - 37 (U/L)    ALT 9  0 - 35 (U/L)    Alkaline Phosphatase 55  39 - 117 (U/L)    Total Bilirubin 0.2 (*) 0.3 - 1.2 (mg/dL)    GFR calc non Af Amer 35 (*) >90 (mL/min)    GFR calc Af Amer 40 (*) >90 (mL/min)   MAGNESIUM     Status: Normal   Collection Time   11/29/10  6:35 AM      Component Value Range Comment   Magnesium 2.0  1.5 - 2.5 (mg/dL)   TSH     Status: Normal   Collection Time   11/29/10  6:35 AM      Component Value Range Comment   TSH 1.276  0.350 - 4.500 (uIU/mL)     Ct Head Wo Contrast  11/28/2010  *RADIOLOGY REPORT*  Clinical Data: Right-sided motor deficit.  Slurred speech.  CT HEAD WITHOUT CONTRAST  Technique:  Contiguous axial images were obtained from the base of the skull through the vertex without contrast.  Comparison: 07/22/2010  Findings: Multifocal areas  of encephalomalacia involving the supratentorial cortex are stable.  No new suspicious low  density is present to suggest acute ischemic change.  Low density in the right side the pons is felt to be due to beam hardening artifact.  No mass effect, midline shift, or acute intracranial hemorrhage. Calvarium is intact.   IMPRESSION: Extensive chronic ischemic changes.  No definite acute intracranial pathology.  Original Report Authenticated By: Donavan Burnet, M.D.    2D Echo showed 50-55% EF no PFO  PENDING--MRI brain, Carotid Dopplers  Assessment:  75 yo female presenting with worsening speech and increased right sided weakness in setting of UTI. Patient has history of bilateral strokes and PFO diagnosed by TEE 2010.  Has had multiple strokes in the past.  Currently on Plavix.  Symptoms are likely more prominent secondary to concurrent UTI but recommend ruling out the possibility of an acute infarct.  Recommendation: (1) continue Plavix (2) agree with MRI head, Carotid doppler, @-D echo--If MRI shows acute stroke would re-evaluate stroke prohylaxis (3) LDL elevated127 --recommend Zocor (4) PT/OT  Felicie Morn PA-C [facility] [phone]  11/29/2010, 2:25 PM   Patient seen and examined. I agree with the above. Case discussed with Dr. Lavera Guise.  Thana Farr, MD [facility] [phone]  11/29/2010  4:59 PM

## 2010-11-29 NOTE — Progress Notes (Signed)
Physical Therapy Evaluation Patient Details Name: [name] MRN: [id number] DOB: 1935/07/13 Today's Date: 11/29/2010  Problem List:  Patient Active Problem List  Diagnoses  . HYPERLIPIDEMIA  . ANEMIA, HYPOCHROMIC  . HYPERTENSION  . CVA  . ASTHMA  . DIVERTICULITIS, COLON  . HYPERGLYCEMIA  . COLONIC POLYPS, ADENOMATOUS, HX OF  . TIA (transient ischemic attack)    Past Medical History:  Past Medical History  Diagnosis Date  . Hypertension   . Stroke     tias, 2 strokes with right sided deficits and slurred speech  . Arthritis     hands  . Depression    Past Surgical History:  Past Surgical History  Procedure Date  . Vascular surgery   . Abdominal hysterectomy     PT Assessment/Plan/Recommendation PT Assessment Clinical Impression Statement: Pt is admitted for left sided weakness and the below problem list.  Pt will benefit from acute PT to maximize independence, balance, and safety to facitlitate d/c home with HHPT and initial 24 hour supervision. PT Recommendation/Assessment: Patient will need skilled PT in the acute care venue PT Problem List: Decreased strength;Decreased activity tolerance;Decreased balance;Decreased mobility Barriers to Discharge: Decreased caregiver support;Other (comment) (Pt will require inital 24 hour supervision.) Barriers to Discharge Comments: Pt reports family/friends can provide initial 24 hour supervision. PT Therapy Diagnosis : Difficulty walking;Generalized weakness PT Plan PT Frequency: Min 4X/week PT Treatment/Interventions: Gait training;Functional mobility training;Balance training;Patient/family education;Neuromuscular re-education PT Recommendation Follow Up Recommendations: Home health PT;24 hour supervision/assistance Equipment Recommended: None recommended by PT PT Goals  Acute Rehab PT Goals PT Goal Formulation: With patient Time For Goal Achievement: 7 days Pt will go Supine/Side to Sit: with modified  independence PT Goal: Supine/Side to Sit - Progress: Other (comment) (Goals set today) Pt will Transfer Sit to Stand/Stand to Sit: with modified independence PT Transfer Goal: Sit to Stand/Stand to Sit - Progress: Other (comment) (Goals set today) Pt will Ambulate: >150 feet;with modified independence;with least restrictive assistive device PT Goal: Ambulate - Progress: Other (comment) (Goals set today)  PT Evaluation Precautions/Restrictions  Precautions Precautions: Fall Required Braces or Orthoses: No Restrictions Weight Bearing Restrictions: No Prior Functioning  Home Living Lives With: Daughter (Daughter works during the day as a school bus driver.) Receives Help From: Other (Comment) (None.  However, neighbor/friends can help if needed.) Type of Home: House Home Layout: One level Home Access: Level entry Home Adaptive Equipment: Walker - rolling Prior Function Level of Independence: Independent with basic ADLs;Independent with homemaking with ambulation;Independent with gait;Independent with transfers Able to Take Stairs?:  (Pt does not have stairs to take.) Driving: No Cognition Cognition Arousal/Alertness: Awake/alert Overall Cognitive Status: Appears within functional limits for tasks assessed Orientation Level: Oriented X4 Sensation/Coordination Sensation Light Touch: Appears Intact Stereognosis: Not tested Hot/Cold: Not tested Proprioception: Not tested Coordination Gross Motor Movements are Fluid and Coordinated: Yes Fine Motor Movements are Fluid and Coordinated: Not tested Extremity Assessment RUE Assessment RUE Assessment: Not tested LUE Assessment LUE Assessment: Not tested RLE Assessment RLE Assessment: Within Functional Limits LLE Assessment LLE Assessment: Within Functional Limits Mobility (including Balance) Bed Mobility Bed Mobility: Yes Supine to Sit: 5: Supervision;HOB flat Supine to Sit Details (indicate cue type and reason): Verbal cues for  safest sequence. Sit to Supine - Left: 5: Supervision;HOB flat Sit to Supine - Left Details (indicate cue type and reason): Verbal cues for safest sequence. Transfers Transfers: Yes Sit to Stand: 4: Min assist;From bed Sit to Stand Details (indicate cue type and reason): Assist for  balance with verbal cues for safety with hand placement. Stand to Sit: 4: Min assist;To bed Stand to Sit Details: Assist to balance with verbal cues for safety with hand placement Ambulation/Gait Ambulation/Gait: Yes Ambulation/Gait Assistance: 4: Min assist Ambulation/Gait Assistance Details (indicate cue type and reason): Assist for balance with cues for tall posture and safety. Ambulation Distance (Feet): 90 Feet Assistive device: 1 person hand held assist Gait Pattern: Decreased step length - right;Decreased step length - left;Shuffle Gait velocity: 0.67 feet/sec Stairs: No Wheelchair Mobility Wheelchair Mobility: No  Posture/Postural Control Posture/Postural Control: No significant limitations Balance Balance Assessed: No Exercise    End of Session PT - End of Session Equipment Utilized During Treatment: Gait belt Activity Tolerance: Patient tolerated treatment well Patient left: in bed;with call bell in reach General Behavior During Session: [facility] for tasks performed Cognition: [facility] for tasks performed  Elon Alas M 11/29/2010, 3:11 PM

## 2010-11-30 ENCOUNTER — Other Ambulatory Visit: Payer: Self-pay

## 2010-11-30 LAB — URINE CULTURE

## 2010-11-30 MED ORDER — INFLUENZA VIRUS VACC SPLIT PF IM SUSP
0.5000 mL | INTRAMUSCULAR | Status: AC
  Administered 2010-12-01: 0.5 mL via INTRAMUSCULAR
  Filled 2010-11-30: qty 0.5

## 2010-11-30 NOTE — Progress Notes (Signed)
Subjective: No events overnight. No complaints  Objective:  Vital signs in last 24 hours:  Filed Vitals:   11/30/10 0100 11/30/10 0400 11/30/10 1012 11/30/10 1435  BP: 192/79 126/74 153/57 151/55  Pulse: 58 57 63 61  Temp: 98.2 F (36.8 C) 98 F (36.7 C) 97.6 F (36.4 C) 97.6 F (36.4 C)  TempSrc: Oral Oral Oral Oral  Resp: 18 19 16 18   Height:      Weight:      SpO2: 100% 100% 100% 100%    Intake/Output from previous day:   Intake/Output Summary (Last 24 hours) at 11/30/10 1803 Last data filed at 11/30/10 3244  Gross per 24 hour  Intake      0 ml  Output    200 ml  Net   -200 ml    Physical Exam: General: Alert, awake, oriented x3, in no acute distress. HEENT: No bruits, no goiter. Heart: Regular rate and rhythm, without murmurs, rubs, gallops. Lungs: Clear to auscultation bilaterally. Abdomen: Soft, nontender, nondistended, positive bowel sounds. Extremities: No clubbing cyanosis or edema with positive pedal pulses.    Lab Results:  Basic Metabolic Panel:    Component Value Date/Time   NA 142 11/29/2010 0635   K 3.6 11/29/2010 0635   CL 109 11/29/2010 0635   CO2 24 11/29/2010 0635   BUN 21 11/29/2010 0635   CREATININE 1.45* 11/29/2010 0635   GLUCOSE 79 11/29/2010 0635   CALCIUM 8.9 11/29/2010 0635   CBC:    Component Value Date/Time   WBC 3.9* 11/29/2010 0635   HGB 11.7* 11/29/2010 0635   HCT 36.4 11/29/2010 0635   PLT 163 11/29/2010 0635   MCV 84.3 11/29/2010 0635   NEUTROABS 9.0* 07/22/2010 1021   LYMPHSABS 1.0 07/22/2010 1021   MONOABS 0.5 07/22/2010 1021   EOSABS 0.0 07/22/2010 1021   BASOSABS 0.0 07/22/2010 1021    Recent Results (from the past 240 hour(s))  URINE CULTURE     Status: Normal   Collection Time   11/29/10  8:10 AM      Component Value Range Status Comment   Specimen Description URINE, RANDOM   Final    Special Requests NONE   Final    Setup Time [id number]   Final    Colony Count 9,000 COLONIES/ML   Final    Culture INSIGNIFICANT  GROWTH   Final    Report Status 11/30/2010 FINAL   Final   MRSA PCR SCREENING     Status: Normal   Collection Time   11/30/10  3:34 AM      Component Value Range Status Comment   MRSA by PCR NEGATIVE  NEGATIVE  Final     Studies/Results: Dg Chest 2 View  11/29/2010  *RADIOLOGY REPORT*  Clinical Data: Short of breath.  Stroke.  CHEST - 2 VIEW  Comparison: 10/05/2009  Findings: Normal heart size.  Clear lungs.  No pneumothorax.  No pleural effusion.  IMPRESSION: No active cardiopulmonary disease.  Original Report Authenticated By: Donavan Burnet, M.D.   Ct Head Wo Contrast  11/28/2010  *RADIOLOGY REPORT*  Clinical Data: Right-sided motor deficit.  Slurred speech.  CT HEAD WITHOUT CONTRAST  Technique:  Contiguous axial images were obtained from the base of the skull through the vertex without contrast.  Comparison: 07/22/2010  Findings: Multifocal areas of encephalomalacia involving the supratentorial cortex are stable.  No new suspicious low density is present to suggest acute ischemic change.  Low density in the right side the pons is felt  to be due to beam hardening artifact.  No mass effect, midline shift, or acute intracranial hemorrhage. Calvarium is intact.  IMPRESSION: Extensive chronic ischemic changes.  No definite acute intracranial pathology.  Original Report Authenticated By: Donavan Burnet, M.D.   Mr Brain Wo Contrast  11/29/2010  *RADIOLOGY REPORT*  Clinical Data:  Prior infarcts.  Increasing right-sided weakness with difficulty following verbal commands.  MRI HEAD WITHOUT CONTRAST MRA HEAD WITHOUT CONTRAST  Technique: Multiplanar, multiecho pulse sequences of the brain and surrounding structures were obtained according to standard protocol without intravenous contrast.  Angiographic images of the head were obtained using MRA technique without contrast.  Comparison: 11/28/2010 CT.  11/23/2008 MR.  MRI HEAD  Findings:  No acute infarct.  Multiple remote infarcts with encephalomalacia  involving the occipital lobes bilaterally, frontal lobes bilaterally and left parietal lobe (where there is a large area of encephalomalacia with laminar necrosis/blood breakdown products from petechial hemorrhage).  No other areas of intracranial hemorrhage noted. Remote tiny right cerebellar infarcts.  Global atrophy.  Ventricular prominence accentuated by the presence of a patent cavum septum pellucidum.  This is unchanged and probably related to atrophy rather than hydrocephalus.  No intracranial mass lesion detected on this unenhanced exam.  Expanded empty sella unchanged.  Cervical medullary junction unremarkable.  Mastoid air cell partial opacification without obstructing lesion noted.  Minimal paranasal sinus mucosal thickening.  IMPRESSION: No acute infarct.  Multiple remote infarcts as described above.  Atrophy.  Please see above.  MRA HEAD  Findings: High-grade stenosis pre cavernous segment of the right internal carotid artery.  Ectasia with areas of narrowing involving cavernous segment and supraclinoid segment of the right internal carotid artery.  Hypoplastic/aplastic A1 segment of the right anterior cerebral artery.  Dilation proximal right middle cerebral artery branch vessel.  This probably is related to atherosclerotic type changes rather than small aneurysm.  Moderate tandem stenoses and irregularity involve the pre cavernous, cavernous and supraclinoid segment of the left internal carotid artery.  Left middle cerebral artery mild to moderate branch vessel irregularity.  Right vertebral artery is dominant.  Mild narrowing distal right vertebral artery.  Mild to moderate narrowing distal left vertebral artery.  Mild ectasia with mild narrowing involving the basilar artery without high-grade stenosis.  Posterior circulation mild to moderate branch vessel irregularity.  IMPRESSION: Prominent intracranial atherosclerotic type changes as noted above.  Original Report Authenticated By: Fuller Canada,  M.D.   Mr Mra Head/brain Wo Cm  11/29/2010  *RADIOLOGY REPORT*  Clinical Data:  Prior infarcts.  Increasing right-sided weakness with difficulty following verbal commands.  MRI HEAD WITHOUT CONTRAST MRA HEAD WITHOUT CONTRAST  Technique: Multiplanar, multiecho pulse sequences of the brain and surrounding structures were obtained according to standard protocol without intravenous contrast.  Angiographic images of the head were obtained using MRA technique without contrast.  Comparison: 11/28/2010 CT.  11/23/2008 MR.  MRI HEAD  Findings:  No acute infarct.  Multiple remote infarcts with encephalomalacia involving the occipital lobes bilaterally, frontal lobes bilaterally and left parietal lobe (where there is a large area of encephalomalacia with laminar necrosis/blood breakdown products from petechial hemorrhage).  No other areas of intracranial hemorrhage noted. Remote tiny right cerebellar infarcts.  Global atrophy.  Ventricular prominence accentuated by the presence of a patent cavum septum pellucidum.  This is unchanged and probably related to atrophy rather than hydrocephalus.  No intracranial mass lesion detected on this unenhanced exam.  Expanded empty sella unchanged.  Cervical medullary junction unremarkable.  Mastoid  air cell partial opacification without obstructing lesion noted.  Minimal paranasal sinus mucosal thickening.  IMPRESSION: No acute infarct.  Multiple remote infarcts as described above.  Atrophy.  Please see above.  MRA HEAD  Findings: High-grade stenosis pre cavernous segment of the right internal carotid artery.  Ectasia with areas of narrowing involving cavernous segment and supraclinoid segment of the right internal carotid artery.  Hypoplastic/aplastic A1 segment of the right anterior cerebral artery.  Dilation proximal right middle cerebral artery branch vessel.  This probably is related to atherosclerotic type changes rather than small aneurysm.  Moderate tandem stenoses and  irregularity involve the pre cavernous, cavernous and supraclinoid segment of the left internal carotid artery.  Left middle cerebral artery mild to moderate branch vessel irregularity.  Right vertebral artery is dominant.  Mild narrowing distal right vertebral artery.  Mild to moderate narrowing distal left vertebral artery.  Mild ectasia with mild narrowing involving the basilar artery without high-grade stenosis.  Posterior circulation mild to moderate branch vessel irregularity.  IMPRESSION: Prominent intracranial atherosclerotic type changes as noted above.  Original Report Authenticated By: Fuller Canada, M.D.    Medications: Scheduled Meds:   . cefTRIAXone (ROCEPHIN) IV  1 g Intravenous QHS  . clopidogrel  75 mg Oral Q breakfast  . enoxaparin  30 mg Subcutaneous Q24H  . ferrous sulfate  325 mg Oral BID WC  . influenza  inactive virus vaccine  0.5 mL Intramuscular Tomorrow-1000  . metoprolol tartrate  25 mg Oral BID   Continuous Infusions:   . sodium chloride 50 mL/hr at 11/30/10 0331   PRN Meds:.senna-docusate  Assessment/Plan:  Principal Problem:  *TIA (transient ischemic attack) Active Problems:  ANEMIA, HYPOCHROMIC  HYPERTENSION  CVA  Plan: MRI of head negative for acute stroke Will set up home health therapy Suspect symptoms likely secondary to dehydration Urine culture negative, so d/c rocephin Likely home in am    LOS: 2 days   Marifer Hurd 11/30/2010, 6:03 PM

## 2010-11-30 NOTE — Progress Notes (Signed)
Subjective: No complaints.  Continues to have difficulty with speech and right sided weakness but this is baseline for patient.  MRI head (-) for stroke.  Objective: Vital signs in last 24 hours: Temp:  [97.9 F (36.6 C)-98.2 F (36.8 C)] 98 F (36.7 C) (11/07 0400) Pulse Rate:  [54-60] 57  (11/07 0400) Resp:  [16-19] 19  (11/07 0400) BP: (126-194)/(65-94) 126/74 mmHg (11/07 0400) SpO2:  [95 %-100 %] 100 % (11/07 0400)  Intake/Output from previous day: 11/06 0701 - 11/07 0700 In: -  Out: 200 [Urine:200] Intake/Output this shift:   Nutritional status: Cardiac  Past Medical History  Diagnosis Date  . Hypertension   . Stroke     tias, 2 strokes with right sided deficits and slurred speech  . Arthritis     hands  . Depression     Neurologic Exam: Mental Status:  Alert, oriented, thought content appropriate. Speech non-fluent. Able to follow 3 step commands without difficulty.  Cranial Nerves:  II- Visual fields grossly intact.  III/IV/VI-Extraocular movements intact. Pupils reactive bilaterally.  V/VII-Smile symmetric  VIII-grossly intact  IX/X-normal gag  XI-bilateral shoulder shrug  XII-midline tongue extension  Motor: 5-/5 in the right upper extremity. 4+ to 5-/5 int he right lower extremity. Tone slightly increased on the right. Patient tends to neglect the right aspect of body on exam with MS testing  Sensory: Pinprick and light touch intact throughout but decreased on right side of body (arm and Leg)  Deep Tendon Reflexes: 2+ and symmetric throughout  Plantars: Downgoing bilaterally  Cerebellar: Normal finger-to-nose,normal heel-to-shin test.   Lab Results:  Basename 11/29/10 0635 11/28/10 1729 11/28/10 1709  WBC 3.9* -- 4.5  HGB 11.7* 12.9 --  HCT 36.4 38.0 --  PLT 163 -- 159  NA 142 145 --  K 3.6 4.0 --  CL 109 108 --  CO2 24 -- 27  GLUCOSE 79 103* --  BUN 21 21 --  CREATININE 1.45* 1.70* --  CALCIUM 8.9 -- 8.9  LABA1C -- -- --   Lipid  Panel  Basename 11/29/10 0635  CHOL 216*  TRIG 133  HDL 62  CHOLHDL 3.5  VLDL 27  LDLCALC 409*    Studies/Results: Dg Chest 2 View  11/29/2010  *RADIOLOGY REPORT*  Clinical Data: Short of breath.  Stroke.  CHEST - 2 VIEW  Comparison: 10/05/2009  Findings: Normal heart size.  Clear lungs.  No pneumothorax.  No pleural effusion.  IMPRESSION: No active cardiopulmonary disease.  Original Report Authenticated By: Donavan Burnet, M.D.   Ct Head Wo Contrast  11/28/2010  *RADIOLOGY REPORT*  Clinical Data: Right-sided motor deficit.  Slurred speech.  CT HEAD WITHOUT CONTRAST  Technique:  Contiguous axial images were obtained from the base of the skull through the vertex without contrast.  Comparison: 07/22/2010  Findings: Multifocal areas of encephalomalacia involving the supratentorial cortex are stable.  No new suspicious low density is present to suggest acute ischemic change.  Low density in the right side the pons is felt to be due to beam hardening artifact.  No mass effect, midline shift, or acute intracranial hemorrhage. Calvarium is intact.  IMPRESSION: Extensive chronic ischemic changes.  No definite acute intracranial pathology.  Original Report Authenticated By: Donavan Burnet, M.D.   Mr Brain Wo Contrast  11/29/2010  *RADIOLOGY REPORT*  Clinical Data:  Prior infarcts.  Increasing right-sided weakness with difficulty following verbal commands.  MRI HEAD WITHOUT CONTRAST MRA HEAD WITHOUT CONTRAST  Technique: Multiplanar, multiecho pulse sequences of  the brain and surrounding structures were obtained according to standard protocol without intravenous contrast.  Angiographic images of the head were obtained using MRA technique without contrast.  Comparison: 11/28/2010 CT.  11/23/2008 MR.  MRI HEAD  Findings:  No acute infarct.  Multiple remote infarcts with encephalomalacia involving the occipital lobes bilaterally, frontal lobes bilaterally and left parietal lobe (where there is a large area of  encephalomalacia with laminar necrosis/blood breakdown products from petechial hemorrhage).  No other areas of intracranial hemorrhage noted. Remote tiny right cerebellar infarcts.  Global atrophy.  Ventricular prominence accentuated by the presence of a patent cavum septum pellucidum.  This is unchanged and probably related to atrophy rather than hydrocephalus.  No intracranial mass lesion detected on this unenhanced exam.  Expanded empty sella unchanged.  Cervical medullary junction unremarkable.  Mastoid air cell partial opacification without obstructing lesion noted.  Minimal paranasal sinus mucosal thickening.  IMPRESSION: No acute infarct.  Multiple remote infarcts as described above.  Atrophy.  Please see above.  MRA HEAD  Findings: High-grade stenosis pre cavernous segment of the right internal carotid artery.  Ectasia with areas of narrowing involving cavernous segment and supraclinoid segment of the right internal carotid artery.  Hypoplastic/aplastic A1 segment of the right anterior cerebral artery.  Dilation proximal right middle cerebral artery branch vessel.  This probably is related to atherosclerotic type changes rather than small aneurysm.  Moderate tandem stenoses and irregularity involve the pre cavernous, cavernous and supraclinoid segment of the left internal carotid artery.  Left middle cerebral artery mild to moderate branch vessel irregularity.  Right vertebral artery is dominant.  Mild narrowing distal right vertebral artery.  Mild to moderate narrowing distal left vertebral artery.  Mild ectasia with mild narrowing involving the basilar artery without high-grade stenosis.  Posterior circulation mild to moderate branch vessel irregularity.  IMPRESSION: Prominent intracranial atherosclerotic type changes as noted above.  Original Report Authenticated By: Fuller Canada, M.D.   Mr Mra Head/brain Wo Cm  11/29/2010  *RADIOLOGY REPORT*  Clinical Data:  Prior infarcts.  Increasing right-sided  weakness with difficulty following verbal commands.  MRI HEAD WITHOUT CONTRAST MRA HEAD WITHOUT CONTRAST  Technique: Multiplanar, multiecho pulse sequences of the brain and surrounding structures were obtained according to standard protocol without intravenous contrast.  Angiographic images of the head were obtained using MRA technique without contrast.  Comparison: 11/28/2010 CT.  11/23/2008 MR.  MRI HEAD  Findings:  No acute infarct.  Multiple remote infarcts with encephalomalacia involving the occipital lobes bilaterally, frontal lobes bilaterally and left parietal lobe (where there is a large area of encephalomalacia with laminar necrosis/blood breakdown products from petechial hemorrhage).  No other areas of intracranial hemorrhage noted. Remote tiny right cerebellar infarcts.  Global atrophy.  Ventricular prominence accentuated by the presence of a patent cavum septum pellucidum.  This is unchanged and probably related to atrophy rather than hydrocephalus.  No intracranial mass lesion detected on this unenhanced exam.  Expanded empty sella unchanged.  Cervical medullary junction unremarkable.  Mastoid air cell partial opacification without obstructing lesion noted.  Minimal paranasal sinus mucosal thickening.   IMPRESSION: No acute infarct.  Multiple remote infarcts as described above.  Atrophy.  Please see above.     MRA HEAD  Findings: High-grade stenosis pre cavernous segment of the right internal carotid artery.  Ectasia with areas of narrowing involving cavernous segment and supraclinoid segment of the right internal carotid artery.  Hypoplastic/aplastic A1 segment of the right anterior cerebral artery.  Dilation proximal  right middle cerebral artery branch vessel.  This probably is related to atherosclerotic type changes rather than small aneurysm.  Moderate tandem stenoses and irregularity involve the pre cavernous, cavernous and supraclinoid segment of the left internal carotid artery.  Left  middle cerebral artery mild to moderate branch vessel irregularity.  Right vertebral artery is dominant.  Mild narrowing distal right vertebral artery.  Mild to moderate narrowing distal left vertebral artery.  Mild ectasia with mild narrowing involving the basilar artery without high-grade stenosis.  Posterior circulation mild to moderate branch vessel irregularity.   IMPRESSION: Prominent intracranial atherosclerotic type changes as noted above.  Original Report Authenticated By: Fuller Canada, M.D.    Medications:  Scheduled:   . cefTRIAXone (ROCEPHIN) IV  1 g Intravenous QHS  . clopidogrel  75 mg Oral Q breakfast  . dextrose      . enoxaparin  30 mg Subcutaneous Q24H  . ferrous sulfate  325 mg Oral BID WC  . metoprolol tartrate  25 mg Oral BID    Assessment/Plan:  75 yo female presenting with worsening speech and increased right sided weakness in setting of UTI. Patient has history of bilateral strokes and PFO diagnosed by TEE 2010. Has had multiple strokes in the past. Currently on Plavix.   MRI head and MRA head (-). 2-D Echo (-) Carotid Doppler (P)  Recommend:   Continue Plavix PT/OT/ST  Continue treatment of UTI.  No further neurology recommendations.   Felicie Morn PA-C [facility] [phone]  11/30/2010, 9:51 AM

## 2010-11-30 NOTE — Progress Notes (Signed)
Physical Therapy Treatment Patient Details Name: JENIYA FLANNIGAN MRN: [id number] DOB: 28-Dec-1935 Today's Date: 11/30/2010  PT Assessment/Plan  PT - Assessment/Plan Comments on Treatment Session: Pt may benefit from use of RW to increase stability/safety with ambulation.  Recommend Assessement of  performance with RW next session Equipment Recommended: None recommended by OT PT Goals  Acute Rehab PT Goals PT Goal: Supine/Side to Sit - Progress: Progressing toward goal PT Transfer Goal: Sit to Stand/Stand to Sit - Progress: Progressing toward goal PT Goal: Ambulate - Progress: Progressing toward goal  PT Treatment Precautions/Restrictions  Precautions Precautions: Fall Required Braces or Orthoses: No Restrictions Weight Bearing Restrictions: No Mobility (including Balance) Bed Mobility Bed Mobility: Yes Supine to Sit: 7: Independent Sit to Supine - Left: 7: Independent Transfers Sit to Stand: 4: Min assist Sit to Stand Details (indicate cue type and reason): cues for initation, safety, hand placemen; assist for balance, safety, anterior wt shift.  Pt braces back of bil LE's on bed for support/stability.  Performed sit<>stand 6x's for instruction/strengthening/activity tolerance.   Stand to Sit: Other (comment) (min guard) Stand to Sit Details: cues for safety, controlled descent, hand placement.   Ambulation/Gait Ambulation/Gait: Yes Ambulation/Gait Assistance: 4: Min assist Ambulation/Gait Assistance Details (indicate cue type and reason): Assistance for balance, safety; pt tends to ambulate on heels & lean towards R side.   Ambulation Distance (Feet): 150 Feet Assistive device: 1 person hand held assist Gait Pattern: Decreased stride length (bilaterally)    Exercise  Other Exercises Other Exercises: Bridging x 10 reps End of Session PT - End of Session Equipment Utilized During Treatment: Gait belt Activity Tolerance: Patient tolerated treatment well Patient left: in  bed;with call bell in reach;with bed alarm set General Behavior During Session: [facility] for tasks performed Cognition: [facility] for tasks performed  Lara Mulch 11/30/2010, 2:48 PM

## 2010-11-30 NOTE — Progress Notes (Signed)
Occupational Therapy Evaluation Patient Details Name: [name] MRN: [id number] DOB: November 03, 1935 Today's Date: 11/30/2010 [id number] EV2 Problem List:  Patient Active Problem List  Diagnoses  . HYPERLIPIDEMIA  . ANEMIA, HYPOCHROMIC  . HYPERTENSION  . CVA  . ASTHMA  . DIVERTICULITIS, COLON  . HYPERGLYCEMIA  . COLONIC POLYPS, ADENOMATOUS, HX OF  . TIA (transient ischemic attack)    Past Medical History:  Past Medical History  Diagnosis Date  . Hypertension   . Stroke     tias, 2 strokes with right sided deficits and slurred speech  . Arthritis     hands  . Depression    Past Surgical History:  Past Surgical History  Procedure Date  . Vascular surgery   . Abdominal hysterectomy     OT Assessment/Plan/Recommendation OT Assessment Clinical Impression Statement: Pt with balance issues impairing  ADL independence. Will follow acutely for ADL training and pt/family education. OT Recommendation/Assessment: Patient will need skilled OT in the acute care venue OT Problem List: Decreased strength;Impaired balance (sitting and/or standing);Decreased knowledge of use of DME or AE;Impaired UE functional use OT Therapy Diagnosis : Generalized weakness;Hemiplegia non-dominant side OT Plan OT Frequency: Min 2X/week OT Treatment/Interventions: Self-care/ADL training;DME and/or AE instruction;Patient/family education OT Recommendation Follow Up Recommendations: Home health OT Equipment Recommended: None recommended by OT Individuals Consulted Consulted and Agree with Results and Recommendations: Patient;Family member/caregiver Family Member Consulted:  (daughter) OT Goals Acute Rehab OT Goals OT Goal Formulation: With patient Time For Goal Achievement: 2 weeks ADL Goals Pt Will Perform Grooming: Standing at sink;with supervision (3 activities) ADL Goal: Grooming - Progress: Other (comment) Pt Will Perform Upper Body Bathing: with supervision;Sitting, edge of  bed;Unsupported;Sit to stand from bed ADL Goal: Upper Body Bathing - Progress: Other (comment) Pt Will Perform Lower Body Bathing: with supervision;Sitting, edge of bed;Sit to stand from bed;Unsupported ADL Goal: Lower Body Bathing - Progress: Other (comment) Pt Will Perform Upper Body Dressing: with set-up;Unsupported;Sitting, bed ADL Goal: Upper Body Dressing - Progress: Other (comment) Pt Will Perform Lower Body Dressing: with supervision;Unsupported;Sitting, bed;Sit to stand from bed ADL Goal: Lower Body Dressing - Progress: Other (comment) Pt Will Transfer to Toilet: with supervision;3-in-1 (over toilet) ADL Goal: Toilet Transfer - Progress: Other (comment) Pt Will Perform Toileting - Clothing Manipulation: with supervision;Standing ADL Goal: Toileting - Clothing Manipulation - Progress: Other (comment) Pt Will Perform Toileting - Hygiene: Independently ADL Goal: Toileting - Hygiene - Progress: Other (comment) Pt Will Perform Tub/Shower Transfer: Tub transfer;with supervision;with DME (with tub bench--pt has at home, but does not use) ADL Goal: Tub/Shower Transfer - Progress: Other (comment)  OT Evaluation Precautions/Restrictions  Precautions Precautions: Fall Required Braces or Orthoses: No Restrictions Weight Bearing Restrictions: No Prior Functioning Home Living Lives With: Daughter Receives Help From: Family Type of Home: House Home Layout: Two level Alternate Level Stairs-Number of Steps: 1 flight--13 steps Home Access: Level entry Bathroom Shower/Tub: Tub/shower unit;Curtain Bathroom Toilet: Standard Home Adaptive Equipment: Bedside commode/3-in-1;Walker - rolling;Tub transfer bench Prior Function Level of Independence: Independent with basic ADLs;Independent with transfers;Independent with gait;Needs assistance with homemaking Able to Take Stairs?: Yes Driving: No Leisure:  (mostly watches tv) ADL ADL Eating/Feeding: Simulated;Independent (including opening  packages) Where Assessed - Eating/Feeding: Edge of bed Grooming: Performed;Wash/dry hands Where Assessed - Grooming: Standing at sink Upper Body Bathing: Minimal assistance;Simulated Where Assessed - Upper Body Bathing: Sitting, bed;Unsupported Lower Body Bathing: Minimal assistance;Simulated Where Assessed - Lower Body Bathing: Sit to stand from bed;Unsupported;Sitting, bed Upper Body Dressing: Performed;Set up Upper Body  Dressing Details (indicate cue type and reason): front opening gown Where Assessed - Upper Body Dressing: Unsupported;Sitting, bed Lower Body Dressing: Performed;Minimal assistance Lower Body Dressing Details (indicate cue type and reason): set up socks, min assist pants due to decreased balance Where Assessed - Lower Body Dressing: Sit to stand from bed;Sitting, bed;Unsupported Toilet Transfer: Performed;Minimal assistance Toilet Transfer Method: Proofreader: Regular height toilet Toileting - Clothing Manipulation: Minimal assistance;Performed Where Assessed - Glass blower/designer Manipulation: Standing Toileting - Hygiene: Performed;Independent Where Assessed - Toileting Hygiene: Sit on 3-in-1 or toilet Vision/Perception  Vision - History Baseline Vision:  (uses glasses intermittently) Patient Visual Report: No change from baseline Perception Perception: Within Functional Limits Praxis Praxis: Intact Cognition Cognition Arousal/Alertness: Awake/alert Overall Cognitive Status: Appears within functional limits for tasks assessed Orientation Level: Oriented X4 Sensation/Coordination Sensation Light Touch: Appears Intact Stereognosis: Not tested Hot/Cold: Appears Intact Proprioception: Impaired by gross assessment Coordination Fine Motor Movements are Fluid and Coordinated: No (right hand with mild incoordination at baseline) Extremity Assessment RUE Assessment RUE Assessment: Exceptions to [facility] (mild incoordination at baseline due to  prior CVA, pt is L ha) LUE Assessment LUE Assessment: Within Functional Limits Mobility  Bed Mobility Bed Mobility: Yes Supine to Sit: 7: Independent Sit to Supine - Left: 7: Independent Transfers Transfers: Yes Sit to Stand: 4: Min assist;From bed Stand to Sit: 5: Supervision    End of Session OT - End of Session Equipment Utilized During Treatment: Gait belt Activity Tolerance: Patient tolerated treatment well Patient left: in bed General Behavior During Session: [facility] for tasks performed Cognition: [facility] for tasks performed   Evern Bio 11/30/2010, 2:36 PM

## 2010-11-30 NOTE — Progress Notes (Signed)
Speech Language/Pathology Speech Language Pathology Treatment Patient Details Name: [name] MRN: [id number] DOB: 09-26-1935 Today's Date: 11/30/2010  SLP Assessment/Plan/Recommendation Treatment complete with focus on dysarthria tx. Patient able to verbalize understanding of compensatory speech intelligibility strategies following initial education. Patient able to utilize speech intelligibility strategies at the phrase and short sentence level to acheive 75% intelligibility with min verbal SLP cues. Decreased intelligibilty to average of 60% at the conversation level. Continue to recommend SLP acute f/u as well as HH f/u after D/C.    Patient progressing towards short term goals.   [name] 11/30/2010, 10:46 AM Name change in 05/2010. Correct on liscence as Emma Swopes MA, CCC-SLP

## 2010-12-01 DIAGNOSIS — N179 Acute kidney failure, unspecified: Secondary | ICD-10-CM | POA: Diagnosis present

## 2010-12-01 DIAGNOSIS — G9340 Encephalopathy, unspecified: Secondary | ICD-10-CM | POA: Diagnosis present

## 2010-12-01 DIAGNOSIS — E860 Dehydration: Secondary | ICD-10-CM | POA: Diagnosis present

## 2010-12-01 LAB — BASIC METABOLIC PANEL
BUN: 20 mg/dL (ref 6–23)
CO2: 24 mEq/L (ref 19–32)
Calcium: 8.9 mg/dL (ref 8.4–10.5)
Creatinine, Ser: 1.36 mg/dL — ABNORMAL HIGH (ref 0.50–1.10)
GFR calc non Af Amer: 38 mL/min — ABNORMAL LOW (ref >90)
Glucose, Bld: 81 mg/dL (ref 70–99)

## 2010-12-01 MED ORDER — SIMVASTATIN 20 MG PO TABS: 20.0000 mg | ORAL_TABLET | Freq: Every day | ORAL | Status: DC

## 2010-12-01 MED ORDER — SIMVASTATIN 20 MG PO TABS
20.0000 mg | ORAL_TABLET | Freq: Every day | ORAL | Status: DC
  Filled 2010-12-01: qty 1

## 2010-12-01 NOTE — Progress Notes (Signed)
Physical Therapy Treatment Patient Details Name: [name] MRN: [id number] DOB: 1936/01/09 Today's Date: 12/01/2010  PT Assessment/Plan  PT - Assessment/Plan Comments on Treatment Session: Pt ambulated with RW today- required mod/max cuing for use of DME but required less physical assistance from this clinician.  Encouraged pt to use RW at home to increase stability/safety- Pt agreeable.   PT Plan: Discharge plan remains appropriate Follow Up Recommendations: Home health PT PT Goals  Acute Rehab PT Goals PT Goal: Supine/Side to Sit - Progress: Met PT Transfer Goal: Sit to Stand/Stand to Sit - Progress: Progressing toward goal PT Goal: Ambulate - Progress: Progressing toward goal  PT Treatment Precautions/Restrictions  Precautions Precautions: Fall Required Braces or Orthoses: No Restrictions Weight Bearing Restrictions: No Mobility (including Balance) Bed Mobility Supine to Sit: 7: Independent Sit to Supine - Left:  (Pt remained sitting EOB at end of session to eat lunch) Transfers Transfers: Yes Sit to Stand: With upper extremity assist;From bed (min guard) Sit to Stand Details (indicate cue type and reason): cues for safe hand placement Stand to Sit: To bed;With upper extremity assist (min guard) Stand to Sit Details: cues for hand placement Ambulation/Gait Ambulation/Gait: Yes Ambulation/Gait Assistance:  (min guard) Ambulation/Gait Assistance Details (indicate cue type and reason): cues to stay inside RW, safe use/management of RW Ambulation Distance (Feet): 300 Feet Assistive device: Rolling walker Gait Pattern: Decreased step length - right;Lateral trunk lean to right;Decreased stride length Stairs: No Wheelchair Mobility Wheelchair Mobility: No    Exercise    End of Session PT - End of Session Equipment Utilized During Treatment: Gait belt Activity Tolerance: Patient tolerated treatment well Patient left: Other (comment) (sitting EOB for lunch with  daughter present) General Behavior During Session: [facility] for tasks performed Cognition: [facility] for tasks performed  Lara Mulch 12/01/2010, 12:01 PM

## 2010-12-01 NOTE — Progress Notes (Signed)
Patient discharged home with daughter. [facility] to visit patient tomorrow at home to evaluate needs re: pt/ot. Discussed stroke education, follow up appointments, medications to continue as well as new meds to start, namely statin. Patient and daughter verbalize understanding. Will close plan of care.

## 2010-12-01 NOTE — Discharge Summary (Addendum)
Patient ID: JAMESON MORROW MRN: [id number] DOB/AGE: 1935-12-14 75 y.o.  Admit date: 11/28/2010 Discharge date: 12/01/2010  Primary Care Physician:  [name], MD  Discharge Diagnoses:     *Encephalopathy, resolved  ANEMIA, HYPOCHROMIC  HYPERTENSION  CVA, history of  CKD (chronic kidney disease) stage 3, GFR 30-59 ml/min  ARF (acute renal failure), resolved  Dehydration, resolved   Current Discharge Medication List    CONTINUE these medications which have NOT CHANGED   Details  clopidogrel (PLAVIX) 75 MG tablet Take 75 mg by mouth daily.      ferrous sulfate 325 (65 FE) MG tablet Take 325 mg by mouth 2 (two) times daily with a meal.      metoprolol tartrate (LOPRESSOR) 25 MG tablet Take 25 mg by mouth 2 (two) times daily.          Disposition and Follow-up: follow up with primary doctor on 11/20 as scheduled  Consults:  neurology   Significant Diagnostic Studies:  Ct Head Wo Contrast  11/28/2010  *RADIOLOGY REPORT*  Clinical Data: Right-sided motor deficit.  Slurred speech.  CT HEAD WITHOUT CONTRAST  Technique:  Contiguous axial images were obtained from the base of the skull through the vertex without contrast.  Comparison: 07/22/2010  Findings: Multifocal areas of encephalomalacia involving the supratentorial cortex are stable.  No new suspicious low density is present to suggest acute ischemic change.  Low density in the right side the pons is felt to be due to beam hardening artifact.  No mass effect, midline shift, or acute intracranial hemorrhage. Calvarium is intact.  IMPRESSION: Extensive chronic ischemic changes.  No definite acute intracranial pathology.  Original Report Authenticated By: Donavan Burnet, M.D.    Brief H and P: For complete details please refer to admission H and P, but in brief 75 year old female with known H/O HT, previous CVA which left with mild residual right sided hemiplegia and dysarthria and known H/O patent foarmen ovale has been  experiencing sudden onset of right upper extremity numbness and decreased strnght of gripping in the right upper extremty. The clinical features started of at 11 AM . Her clinical features have improved and states she is back to base line. Thus patietn was not a candidate for TPA. CT Head does not show anything acute and monitor shows sinus rhythm. In addition patient has UTI. Patient has not lost consciousness or has no blurred vision and had no difficulty in the other extremities.   Physical Exam on Discharge:  Filed Vitals:   11/30/10 1822 11/30/10 2100 12/01/10 0200 12/01/10 0600  BP: 164/52 151/44 132/61 131/56  Pulse: 57 59 51 53  Temp: 97.9 F (36.6 C) 98 F (36.7 C) 97.8 F (36.6 C) 97.7 F (36.5 C)  TempSrc: Oral Oral Oral Oral  Resp: 16 20 20 20   Height:      Weight:      SpO2: 100% 100% 100% 100%     Intake/Output Summary (Last 24 hours) at 12/01/10 7829 Last data filed at 11/30/10 2000  Gross per 24 hour  Intake    100 ml  Output    200 ml  Net   -100 ml    General: Alert, awake, oriented x3, in no acute distress. HEENT: No bruits, no goiter. Heart: Regular rate and rhythm, without murmurs, rubs, gallops. Lungs: Clear to auscultation bilaterally. Abdomen: Soft, nontender, nondistended, positive bowel sounds. Extremities: No clubbing cyanosis or edema with positive pedal pulses.   CBC:    Component  Value Date/Time   WBC 3.9* 11/29/2010 0635   HGB 11.7* 11/29/2010 0635   HCT 36.4 11/29/2010 0635   PLT 163 11/29/2010 0635   MCV 84.3 11/29/2010 0635   NEUTROABS 9.0* 07/22/2010 1021   LYMPHSABS 1.0 07/22/2010 1021   MONOABS 0.5 07/22/2010 1021   EOSABS 0.0 07/22/2010 1021   BASOSABS 0.0 07/22/2010 1021    Basic Metabolic Panel:    Component Value Date/Time   NA 136 12/01/2010 0542   K 4.5 12/01/2010 0542   CL 106 12/01/2010 0542   CO2 24 12/01/2010 0542   BUN 20 12/01/2010 0542   CREATININE 1.36* 12/01/2010 0542   GLUCOSE 81 12/01/2010 0542   CALCIUM 8.9 12/01/2010  0542    Hospital Course:    *Encephalopathy, likely secondary to dehydration, improved with IVF and now back to baseline. Stroke work up has been negative.   ANEMIA, HYPOCHROMIC  HYPERTENSION, stable  CVA    CKD (chronic kidney disease) stage 3, GFR 30-59 ml/min, GFR now at baseline  ARF (acute renal failure), resolved with IVF  Dehydration, resolved with IVF  Hyperlipidemia, patient's LDL found to be elevated at 127, will start low dose zocor, further follow up with PMD   Time spent on Discharge:  Signed: MEMON,JEHANZEB 12/01/2010, 8:28 AM

## 2010-12-01 NOTE — Progress Notes (Signed)
Patient set up with [facility] for HHPT/OT.  Patient for possible discharge today, referral sent to [facility] via [facility].  Lupita Leash notified .  Soc will begin 24 to 248 hrs post discharge. Letha [place] [phone]

## 2011-01-06 ENCOUNTER — Encounter: Payer: Self-pay | Admitting: *Deleted

## 2011-01-16 ENCOUNTER — Encounter: Payer: Self-pay | Admitting: Internal Medicine

## 2011-01-16 ENCOUNTER — Ambulatory Visit (INDEPENDENT_AMBULATORY_CARE_PROVIDER_SITE_OTHER): Payer: PRIVATE HEALTH INSURANCE | Admitting: Internal Medicine

## 2011-01-16 VITALS — BP 132/60 | HR 60 | Ht 66.0 in | Wt 138.0 lb

## 2011-01-16 DIAGNOSIS — K625 Hemorrhage of anus and rectum: Secondary | ICD-10-CM

## 2011-01-16 DIAGNOSIS — K648 Other hemorrhoids: Secondary | ICD-10-CM

## 2011-01-16 MED ORDER — HYDROCORTISONE ACE-PRAMOXINE 2.5-1 % RE CREA: TOPICAL_CREAM | Freq: Three times a day (TID) | RECTAL | Status: AC | PRN

## 2011-01-16 NOTE — Progress Notes (Signed)
[name] 1935/10/08 MRN [id number]    History of Present Illness:  This is a 75 year old African American female who is status post subtotal colectomy for recurrent diverticulitis by Dr. Abbey Chatters in September 2010. She has had several episodes of painless rectal bleeding. She denies rectal pain. She denies abdominal pain. She has occasional diarrhea, having 3-4 loose bowel movements a day. She has a history of extensive diverticulosis and many episodes of diverticulitis requiring hospitalization. She had an adenomatous polyp in 2002. She has a history of iron deficiency anemia and high blood pressure. A CT scan of the abdomen in 2010 showed a gallstone but during a laparotomy, Dr Abbey Chatters could not confirm any gallstones. She lives with her daughter who reports that her mother eats well but gets confused at times.   Past Medical History  Diagnosis Date  . Hypertension   . Stroke     tias, 2 strokes with right sided deficits and slurred speech  . Arthritis     hands  . Depression   . Asthma   . Hypochromic anemia   . Hyperlipidemia   . Hx of adenomatous colonic polyps   . Diverticulosis   . Chronic kidney disease (CKD), stage III (moderate)    Past Surgical History  Procedure Date  . Vascular surgery   . Abdominal hysterectomy   . Other surgical history     2 ear surgeries  . Tonsillectomy     reports that she has never smoked. She has never used smokeless tobacco. She reports that she does not drink alcohol or use illicit drugs. family history includes Cancer in her sister; Depression in her father; Diabetes in her father and paternal aunt; HIV in her brother; Heart disease in her father; Prostate cancer in an unspecified family member; and Stroke in her father.  There is no history of Colon cancer. Allergies  Allergen Reactions  . Aspirin         Review of Systems: According to the daughter there is no dysphagia odynophagia shortness of breath or chest pain  The remainder of the 10 point ROS is negative except as outlined in H&P   Physical Exam: General appearance  Well developed, in no distress. Somewhat confused Eyes- non icteric. HEENT nontraumatic, normocephalic. Mouth no lesions, tongue papillated, no cheilosis. Neck supple without adenopathy, thyroid not enlarged, no carotid bruits, no JVD. Lungs Clear to auscultation bilaterally. Cor normal S1, normal S2, regular rhythm, no murmur,  quiet precordium. Abdomen: Soft relaxed nontender with normoactive bowel sounds. No tenderness. No distention. Rectal: And anoscopic exam reveals normal perianal area. Normal rectal sphincter tone. Small amount of Hemoccult negative stool. 3 small internal hemorrhoids doubtful relapse. Appear erythematous but no active bleeding Extremities no pedal edema. Skin no lesions. Neurological alert and oriented x 3. Psychological normal mood and affect.  Assessment and Plan:  Problem #1 Low volume hematochezia likely from internal hemorrhoids. She is post subtotal colectomy for recurrent diverticulitis. We will treat her with Analpram cream 2.5% applied 3 times a day. If bleeding continues, we will proceed with a flexible sigmoidoscopy.   01/16/2011 [name]

## 2011-01-16 NOTE — Patient Instructions (Signed)
We have sent the following medications to your pharmacy for you to pick up at your convenience: Analpram Cream. Apply to rectum three times daily as needed. CC: Dr Leilani Able

## 2011-07-06 ENCOUNTER — Emergency Department (INDEPENDENT_AMBULATORY_CARE_PROVIDER_SITE_OTHER)
Admission: EM | Admit: 2011-07-06 | Discharge: 2011-07-06 | Disposition: A | Discharge status: Home / Self Care | Payer: PRIVATE HEALTH INSURANCE | Source: Home / Self Care | Attending: Family Medicine | Admitting: Family Medicine

## 2011-07-06 ENCOUNTER — Encounter (HOSPITAL_COMMUNITY): Payer: Self-pay | Admitting: Emergency Medicine

## 2011-07-06 DIAGNOSIS — I878 Other specified disorders of veins: Secondary | ICD-10-CM

## 2011-07-06 DIAGNOSIS — R609 Edema, unspecified: Secondary | ICD-10-CM

## 2011-07-06 DIAGNOSIS — I872 Venous insufficiency (chronic) (peripheral): Secondary | ICD-10-CM

## 2011-07-06 DIAGNOSIS — R600 Localized edema: Secondary | ICD-10-CM

## 2011-07-06 MED ORDER — MEDICAL COMPRESSION STOCKINGS MISC: 1.0000 | Freq: Every day | Status: DC

## 2011-07-06 NOTE — ED Notes (Signed)
Patient is resting comfortably.pt and family given multiple blankets per request. Informed of wait time for md

## 2011-07-06 NOTE — ED Notes (Signed)
Pt daughter not found in waiting room or outside. md and nurse at bedside to provide instructions for care at home. Pt verbalized understanding

## 2011-07-06 NOTE — Discharge Instructions (Signed)
My impression is that the swelling in your ankles is related venous pump insufficiency triggered by sitting in chair for long periods during the daytime which makes difficult for the blood to flow upward. As your swelling disappears during nighttime I recommend that you wear your compression stockings as soon as she wakes up in the morning and keep them during the daytime removed prior to sleep at night. Keep legs elevated while you are sitting in the chair for prolonged time . Take breaks during the day for short walks. Avoid prolonged standing.  Follow up with your primary care provider in one week to monitor your symptoms.

## 2011-07-06 NOTE — ED Notes (Signed)
MD at bedside. 

## 2011-07-06 NOTE — ED Notes (Signed)
PT HERE WITH SWELLING TO BOTH FEET AND ANKLE X 2 DYS.DAUGHTER STATES SHE HAS BEEN HAVING EDEMA X 3 MNTHS.WENT TO PCP AND PRESCRIBED DIURETIC BUT SWELLING RETURNED.+ 2 EDEMA IN L ANKLE AND + 1 R  WITH COOLNESS.C/O PAIN WITH WALKING BUT DENIES SOB.PT HAS HX X 2 STROKES [phone] WITH MIN RESIDUAL IN  R HAND

## 2011-07-08 NOTE — ED Provider Notes (Signed)
History     CSN: [id number]  Arrival date & time 07/06/11  1419   First MD Initiated Contact with Patient 07/06/11 1649      Chief Complaint  Patient presents with  . Foot Swelling    (Consider location/radiation/quality/duration/timing/severity/associated sxs/prior treatment) HPI Comments: 76 y/o female with multiple co morbidities including HTN, CKD and stroke with right upper body weakness and dysarthria as sequelae. Here c/o of bilateral ankle edema for months. She has been seen by her PCP for same complain few weeks ago and had a diuretic prescribed. Ankle swelling worse in last 2 days. No ulcers or pain. Patient ambulatory. Patient states that she spent most part of the day sitting in a chair. Does not elevate her legs while sitting. Daughter complains about mother not having the motivation to walk or do exercises as she has been advised by her pcp. Denies cough, palpitations, dizziness or shortness of breath. No changes in her mental status. No chest pain. Making urine as usual.    Past Medical History  Diagnosis Date  . Hypertension   . Stroke     tias, 2 strokes with right sided deficits and slurred speech  . Arthritis     hands  . Depression   . Asthma   . Hypochromic anemia   . Hyperlipidemia   . Hx of adenomatous colonic polyps   . Diverticulosis   . Chronic kidney disease (CKD), stage III (moderate)     Past Surgical History  Procedure Date  . Vascular surgery   . Abdominal hysterectomy   . Other surgical history     2 ear surgeries  . Tonsillectomy     Family History  Problem Relation Age of Onset  . Diabetes Paternal Aunt   . Colon cancer Neg Hx   . Stroke Father   . HIV Brother   . Heart disease Father   . Diabetes Father   . Prostate cancer    . Depression Father   . Cancer Sister     unknown type    History  Substance Use Topics  . Smoking status: Never Smoker   . Smokeless tobacco: Never Used  . Alcohol Use: No    OB History    Grav  Para Term Preterm Abortions TAB SAB Ect Mult Living                  Review of Systems  Constitutional: Negative for fever, chills, activity change and appetite change.  Respiratory: Negative for cough, chest tightness, shortness of breath and wheezing.   Cardiovascular: Positive for leg swelling. Negative for chest pain and palpitations.  Gastrointestinal: Negative for nausea, vomiting and diarrhea.  Genitourinary: Negative for dysuria, flank pain and decreased urine volume.  Skin: Negative for rash.  Neurological: Positive for speech difficulty. Negative for dizziness, tremors, seizures, syncope, weakness, light-headedness, numbness and headaches.       Dysarthric speech not new.    Allergies  Aspirin  Home Medications   Current Outpatient Rx  Name Route Sig Dispense Refill  . CLOPIDOGREL BISULFATE 75 MG PO TABS Oral Take 75 mg by mouth daily.      Marland Kitchen MEDICAL COMPRESSION STOCKINGS MISC Does not apply 1 Device by Does not apply route daily. Use during day time remove for sleep at night time. 1 each 0  . FERROUS SULFATE 325 (65 FE) MG PO TABS Oral Take 325 mg by mouth 2 (two) times daily with a meal.      .  METOPROLOL TARTRATE 25 MG PO TABS Oral Take 25 mg by mouth 2 (two) times daily.      Marland Kitchen SIMVASTATIN 20 MG PO TABS Oral Take 1 tablet (20 mg total) by mouth daily at 6 PM. 30 tablet 1    BP 189/61  Pulse 60  Temp 98.8 F (37.1 C) (Oral)  Resp 18  SpO2 100%  Physical Exam  Nursing note and vitals reviewed. Constitutional: She is oriented to person, place, and time. She appears well-developed and well-nourished. No distress.  HENT:  Head: Normocephalic and atraumatic.  Right Ear: External ear normal.  Left Ear: External ear normal.  Nose: Nose normal.  Mouth/Throat: Oropharynx is clear and moist. No oropharyngeal exudate.  Eyes: Conjunctivae and EOM are normal. Pupils are equal, round, and reactive to light. No scleral icterus.  Neck: Normal range of motion. Neck supple.  No JVD present. No tracheal deviation present. No thyromegaly present.  Cardiovascular: Normal rate, regular rhythm and normal heart sounds.        Bilateral symmetric bimalleolar ankle pitting edema. 1+ bilaterally. No erythema. No tenderness. No skin abrasions or ulcers. Some venostasis changes of the skin.  Faint but present dorsal pedis and tibial posterior pulses bilaterally.   Pulmonary/Chest: Effort normal and breath sounds normal.  Musculoskeletal:       Ankle exam as per CV above.  Lymphadenopathy:    She has no cervical adenopathy.  Neurological: She is alert and oriented to person, place, and time.       Right arm weakness (not new) no spasticity.  Skin: No rash noted. She is not diaphoretic.    ED Course  Procedures (including critical care time)  Labs Reviewed - No data to display No results found.   1. Bilateral lower extremity edema   2. Venous stasis of lower extremity       MDM  Impress impending edema due to poor mobility. Prescribed compression stockings. Encouraged leg elevation and lower extremity pump exercises. Follow up with PCP in 1 week or earlier if worsening swelling or new symptoms.         Sharin Grave, MD 07/08/11 1539

## 2011-10-08 ENCOUNTER — Encounter (HOSPITAL_COMMUNITY): Payer: Self-pay | Admitting: Emergency Medicine

## 2011-10-08 ENCOUNTER — Emergency Department (HOSPITAL_COMMUNITY)
Admission: EM | Admit: 2011-10-08 | Discharge: 2011-10-09 | Disposition: A | Discharge status: Home / Self Care | Payer: PRIVATE HEALTH INSURANCE | Attending: Emergency Medicine | Admitting: Emergency Medicine

## 2011-10-08 DIAGNOSIS — N183 Chronic kidney disease, stage 3 unspecified: Secondary | ICD-10-CM | POA: Insufficient documentation

## 2011-10-08 DIAGNOSIS — Z8673 Personal history of transient ischemic attack (TIA), and cerebral infarction without residual deficits: Secondary | ICD-10-CM | POA: Insufficient documentation

## 2011-10-08 DIAGNOSIS — E785 Hyperlipidemia, unspecified: Secondary | ICD-10-CM | POA: Insufficient documentation

## 2011-10-08 DIAGNOSIS — I129 Hypertensive chronic kidney disease with stage 1 through stage 4 chronic kidney disease, or unspecified chronic kidney disease: Secondary | ICD-10-CM | POA: Insufficient documentation

## 2011-10-08 DIAGNOSIS — M129 Arthropathy, unspecified: Secondary | ICD-10-CM | POA: Insufficient documentation

## 2011-10-08 DIAGNOSIS — N390 Urinary tract infection, site not specified: Secondary | ICD-10-CM

## 2011-10-08 NOTE — ED Notes (Signed)
Pt alert, arrives from home, c/o gen weakness, onset gradual, resp even unlabored, skin pwd, c/o right foot pain from previous injury

## 2011-10-08 NOTE — ED Notes (Signed)
EKG printed and given to EDP [facility] for review. Last confirmed EKG printed for comparison

## 2011-10-09 LAB — CBC WITH DIFFERENTIAL/PLATELET
Basophils Relative: 0 % (ref 0–1)
HCT: 38.3 % (ref 36.0–46.0)
Hemoglobin: 12.9 g/dL (ref 12.0–15.0)
Lymphocytes Relative: 49 % — ABNORMAL HIGH (ref 12–46)
Monocytes Absolute: 0.4 10*3/uL (ref 0.1–1.0)
Monocytes Relative: 7 % (ref 3–12)
Neutro Abs: 2.2 10*3/uL (ref 1.7–7.7)
Neutrophils Relative %: 43 % (ref 43–77)
RBC: 4.56 MIL/uL (ref 3.87–5.11)
WBC: 5.1 10*3/uL (ref 4.0–10.5)

## 2011-10-09 LAB — URINALYSIS, ROUTINE W REFLEX MICROSCOPIC
Glucose, UA: NEGATIVE mg/dL
Nitrite: NEGATIVE
Specific Gravity, Urine: 1.023 (ref 1.005–1.030)
pH: 5.5 (ref 5.0–8.0)

## 2011-10-09 LAB — COMPREHENSIVE METABOLIC PANEL
AST: 26 U/L (ref 0–37)
Albumin: 3.7 g/dL (ref 3.5–5.2)
Alkaline Phosphatase: 54 U/L (ref 39–117)
BUN: 21 mg/dL (ref 6–23)
CO2: 25 mEq/L (ref 19–32)
Chloride: 103 mEq/L (ref 96–112)
Creatinine, Ser: 1.34 mg/dL — ABNORMAL HIGH (ref 0.50–1.10)
GFR calc non Af Amer: 38 mL/min — ABNORMAL LOW (ref >90)
Potassium: 4.4 mEq/L (ref 3.5–5.1)
Total Bilirubin: 0.2 mg/dL — ABNORMAL LOW (ref 0.3–1.2)

## 2011-10-09 LAB — URINE MICROSCOPIC-ADD ON

## 2011-10-09 MED ORDER — SULFAMETHOXAZOLE-TMP DS 800-160 MG PO TABS
1.0000 | ORAL_TABLET | Freq: Once | ORAL | Status: AC
  Administered 2011-10-09: 1 via ORAL
  Filled 2011-10-09: qty 1

## 2011-10-09 MED ORDER — SULFAMETHOXAZOLE-TRIMETHOPRIM 800-160 MG PO TABS: 1.0000 | ORAL_TABLET | Freq: Two times a day (BID) | ORAL | Status: AC

## 2011-10-09 NOTE — ED Provider Notes (Signed)
History     CSN: [id number]  Arrival date & time 10/08/11  1939   First MD Initiated Contact with Patient 10/08/11 2324      Chief Complaint  Patient presents with  . Fatigue    (Consider location/radiation/quality/duration/timing/severity/associated sxs/prior treatment) HPI 76 year old female presents to emergency room complaining of dehydration. She reports that she had been trying to drink plenty of water, but feels weak and dehydrated. She was encouraged by her daughter who lives with her to go to the emergency department. She denies any fever, chest pain, shortness of breath, abdominal pain. She complains of pain in her right great toe where she recently stubbed it while getting up in the nighht to to the bathroom.  Patient is having normal bowel movements. Patient with prior history of stroke, no new deficits noted.  Past Medical History  Diagnosis Date  . Hypertension   . Stroke     tias, 2 strokes with right sided deficits and slurred speech  . Arthritis     hands  . Depression   . Asthma   . Hypochromic anemia   . Hyperlipidemia   . Hx of adenomatous colonic polyps   . Diverticulosis   . Chronic kidney disease (CKD), stage III (moderate)     Past Surgical History  Procedure Date  . Vascular surgery   . Abdominal hysterectomy   . Other surgical history     2 ear surgeries  . Tonsillectomy     Family History  Problem Relation Age of Onset  . Diabetes Paternal Aunt   . Colon cancer Neg Hx   . Stroke Father   . HIV Brother   . Heart disease Father   . Diabetes Father   . Prostate cancer    . Depression Father   . Cancer Sister     unknown type    History  Substance Use Topics  . Smoking status: Never Smoker   . Smokeless tobacco: Never Used  . Alcohol Use: No    OB History    Grav Para Term Preterm Abortions TAB SAB Ect Mult Living                  Review of Systems  All other systems reviewed and are negative.    Allergies   Aspirin  Home Medications   Current Outpatient Rx  Name Route Sig Dispense Refill  . CLOPIDOGREL BISULFATE 75 MG PO TABS Oral Take 75 mg by mouth daily.      Marland Kitchen FERROUS SULFATE 325 (65 FE) MG PO TABS Oral Take 325 mg by mouth 2 (two) times daily.    Marland Kitchen METOPROLOL TARTRATE 25 MG PO TABS Oral Take 25 mg by mouth 2 (two) times daily.      Marland Kitchen MEDICAL COMPRESSION STOCKINGS MISC Does not apply 1 Device by Does not apply route daily. Use during day time remove for sleep at night time. 1 each 0    BP 134/71  Pulse 82  Temp 98 F (36.7 C) (Oral)  Resp 16  SpO2 99%  Physical Exam  Nursing note and vitals reviewed. Constitutional: She is oriented to person, place, and time. She appears well-developed and well-nourished. No distress.  HENT:  Head: Normocephalic and atraumatic.  Nose: Nose normal.  Mouth/Throat: Oropharynx is clear and moist.  Eyes: Conjunctivae normal and EOM are normal. Pupils are equal, round, and reactive to light.  Neck: Normal range of motion. Neck supple. No JVD present. No tracheal deviation present. No thyromegaly  present.  Cardiovascular: Normal rate, regular rhythm, normal heart sounds and intact distal pulses.  Exam reveals no gallop and no friction rub.   No murmur heard. Pulmonary/Chest: Effort normal and breath sounds normal. No stridor. No respiratory distress. She has no wheezes. She has no rales. She exhibits no tenderness.  Abdominal: Soft. Bowel sounds are normal. She exhibits no distension and no mass. There is no tenderness. There is no rebound and no guarding.  Musculoskeletal: Normal range of motion. She exhibits tenderness (Tenderness to tip of right great toe without crepitus or deformity noted). She exhibits no edema.  Lymphadenopathy:    She has no cervical adenopathy.  Neurological: She is alert and oriented to person, place, and time.  Skin: Skin is warm and dry. No rash noted. No erythema. No pallor.  Psychiatric: She has a normal mood and  affect. Her behavior is normal. Judgment and thought content normal.    ED Course  Procedures (including critical care time)  Labs Reviewed  URINALYSIS, ROUTINE W REFLEX MICROSCOPIC - Abnormal; Notable for the following:    Leukocytes, UA SMALL (*)     All other components within normal limits  CBC WITH DIFFERENTIAL - Abnormal; Notable for the following:    Lymphocytes Relative 49 (*)     All other components within normal limits  COMPREHENSIVE METABOLIC PANEL - Abnormal; Notable for the following:    Creatinine, Ser 1.34 (*)     Total Bilirubin 0.2 (*)     GFR calc non Af Amer 38 (*)     GFR calc Af Amer 44 (*)     All other components within normal limits  URINE MICROSCOPIC-ADD ON  URINE CULTURE   No results found.   1. Urinary tract infection      Date: 10/09/2011  Rate: 63  Rhythm: normal sinus rhythm  QRS Axis: normal  Intervals: PR prolonged  ST/T Wave abnormalities: normal  Conduction Disutrbances:first-degree A-V block   Narrative Interpretation:   Old EKG Reviewed: unchanged    MDM  76 year old female with weakness and self-reported dehydration. Patient has moist mucous membranes, reports she is tolerating by mouth's. We'll check baseline labs, EKG, orthostatics. She should overall looks well especially will be able to home to followup with her primary care Dr.        Olivia Mackie, MD 10/09/11 [phone]

## 2011-10-10 LAB — URINE CULTURE: Colony Count: NO GROWTH

## 2012-01-09 ENCOUNTER — Encounter (HOSPITAL_COMMUNITY): Payer: Self-pay | Admitting: Emergency Medicine

## 2012-01-09 ENCOUNTER — Emergency Department (HOSPITAL_COMMUNITY)
Admission: EM | Admit: 2012-01-09 | Discharge: 2012-01-09 | Disposition: A | Discharge status: Home / Self Care | Payer: PRIVATE HEALTH INSURANCE | Attending: Emergency Medicine | Admitting: Emergency Medicine

## 2012-01-09 ENCOUNTER — Emergency Department (HOSPITAL_COMMUNITY): Payer: PRIVATE HEALTH INSURANCE

## 2012-01-09 DIAGNOSIS — D509 Iron deficiency anemia, unspecified: Secondary | ICD-10-CM | POA: Insufficient documentation

## 2012-01-09 DIAGNOSIS — Z8719 Personal history of other diseases of the digestive system: Secondary | ICD-10-CM | POA: Insufficient documentation

## 2012-01-09 DIAGNOSIS — F29 Unspecified psychosis not due to a substance or known physiological condition: Secondary | ICD-10-CM | POA: Insufficient documentation

## 2012-01-09 DIAGNOSIS — R5383 Other fatigue: Secondary | ICD-10-CM | POA: Insufficient documentation

## 2012-01-09 DIAGNOSIS — N183 Chronic kidney disease, stage 3 unspecified: Secondary | ICD-10-CM | POA: Insufficient documentation

## 2012-01-09 DIAGNOSIS — Z8659 Personal history of other mental and behavioral disorders: Secondary | ICD-10-CM | POA: Insufficient documentation

## 2012-01-09 DIAGNOSIS — E785 Hyperlipidemia, unspecified: Secondary | ICD-10-CM | POA: Insufficient documentation

## 2012-01-09 DIAGNOSIS — Z860100 Personal history of colon polyps, unspecified: Secondary | ICD-10-CM | POA: Insufficient documentation

## 2012-01-09 DIAGNOSIS — E860 Dehydration: Secondary | ICD-10-CM | POA: Insufficient documentation

## 2012-01-09 DIAGNOSIS — Z8673 Personal history of transient ischemic attack (TIA), and cerebral infarction without residual deficits: Secondary | ICD-10-CM | POA: Insufficient documentation

## 2012-01-09 DIAGNOSIS — R5381 Other malaise: Secondary | ICD-10-CM | POA: Insufficient documentation

## 2012-01-09 DIAGNOSIS — I129 Hypertensive chronic kidney disease with stage 1 through stage 4 chronic kidney disease, or unspecified chronic kidney disease: Secondary | ICD-10-CM | POA: Insufficient documentation

## 2012-01-09 DIAGNOSIS — Z8739 Personal history of other diseases of the musculoskeletal system and connective tissue: Secondary | ICD-10-CM | POA: Insufficient documentation

## 2012-01-09 DIAGNOSIS — J45909 Unspecified asthma, uncomplicated: Secondary | ICD-10-CM | POA: Insufficient documentation

## 2012-01-09 LAB — CBC WITH DIFFERENTIAL/PLATELET
Basophils Absolute: 0 10*3/uL (ref 0.0–0.1)
Lymphocytes Relative: 45 % (ref 12–46)
Lymphs Abs: 1.6 10*3/uL (ref 0.7–4.0)
Neutro Abs: 1.6 10*3/uL — ABNORMAL LOW (ref 1.7–7.7)
Neutrophils Relative %: 44 % (ref 43–77)
Platelets: 183 10*3/uL (ref 150–400)
RBC: 4.43 MIL/uL (ref 3.87–5.11)
RDW: 12.9 % (ref 11.5–15.5)
WBC: 3.6 10*3/uL — ABNORMAL LOW (ref 4.0–10.5)

## 2012-01-09 LAB — URINALYSIS, ROUTINE W REFLEX MICROSCOPIC
Glucose, UA: NEGATIVE mg/dL
Protein, ur: NEGATIVE mg/dL
Specific Gravity, Urine: 1.012 (ref 1.005–1.030)
pH: 6 (ref 5.0–8.0)

## 2012-01-09 LAB — BASIC METABOLIC PANEL
CO2: 27 mEq/L (ref 19–32)
Calcium: 9 mg/dL (ref 8.4–10.5)
Chloride: 99 mEq/L (ref 96–112)
Glucose, Bld: 102 mg/dL — ABNORMAL HIGH (ref 70–99)
Potassium: 3.4 mEq/L — ABNORMAL LOW (ref 3.5–5.1)
Sodium: 136 mEq/L (ref 135–145)

## 2012-01-09 LAB — URINE MICROSCOPIC-ADD ON

## 2012-01-09 LAB — TROPONIN I: Troponin I: <0.3 ng/mL (ref <0.30)

## 2012-01-09 MED ORDER — SODIUM CHLORIDE 0.9 % IV BOLUS (SEPSIS)
1000.0000 mL | Freq: Once | INTRAVENOUS | Status: AC
  Administered 2012-01-09: 1000 mL via INTRAVENOUS

## 2012-01-09 NOTE — ED Notes (Signed)
Pt here via EMS for uti.Per family information  stated that pt has been confused over the last few days and been c/o of burning

## 2012-01-09 NOTE — ED Notes (Signed)
OZH:[id number]<HQ> Expected date:01/09/12<BR> Expected time:10:31 AM<BR> Means of arrival:Ambulance<BR> Comments:<BR> Elderly, weak, confused

## 2012-01-09 NOTE — ED Provider Notes (Signed)
History     CSN: [id number]  Arrival date & time 01/09/12  1041   First MD Initiated Contact with Patient 01/09/12 1050      No chief complaint on file.   (Consider location/radiation/quality/duration/timing/severity/associated sxs/prior treatment) HPI  76 year old female with history of stroke, hypochromic anemia, chronic kidney disease, stage III, and diverticulosis presents for evaluations of weakness and confusion.  Patient lives at home with her daughter. Patient reports since yesterday she has been feeling more tired and confused than usual. She mistakenly thought Monday was Tuesday. She endorses lightheadedness. Her daughter suggest for her to come to the ER for evaluation because she she was found to have a urinary tract infection the last time she felt this way. Otherwise patient denies fever, headache, sore throat, sneezing, coughing, chest pain, shortness of breath, abdominal pain, back pain, dysuria, rash, numbness. She does report some mild burning with urination.  She mentioned that her prior stroke has affected her speech however she can comprehend.      Past Medical History  Diagnosis Date  . Hypertension   . Stroke     tias, 2 strokes with right sided deficits and slurred speech  . Arthritis     hands  . Depression   . Asthma   . Hypochromic anemia   . Hyperlipidemia   . Hx of adenomatous colonic polyps   . Diverticulosis   . Chronic kidney disease (CKD), stage III (moderate)     Past Surgical History  Procedure Date  . Vascular surgery   . Abdominal hysterectomy   . Other surgical history     2 ear surgeries  . Tonsillectomy     Family History  Problem Relation Age of Onset  . Diabetes Paternal Aunt   . Colon cancer Neg Hx   . Stroke Father   . HIV Brother   . Heart disease Father   . Diabetes Father   . Prostate cancer    . Depression Father   . Cancer Sister     unknown type    History  Substance Use Topics  . Smoking status: Never  Smoker   . Smokeless tobacco: Never Used  . Alcohol Use: No    OB History    Grav Para Term Preterm Abortions TAB SAB Ect Mult Living                  Review of Systems  All other systems reviewed and are negative.    Allergies  Aspirin  Home Medications   Current Outpatient Rx  Name  Route  Sig  Dispense  Refill  . CLOPIDOGREL BISULFATE 75 MG PO TABS   Oral   Take 75 mg by mouth daily.           Marland Kitchen MEDICAL COMPRESSION STOCKINGS MISC   Does not apply   1 Device by Does not apply route daily. Use during day time remove for sleep at night time.   1 each   0   . FERROUS SULFATE 325 (65 FE) MG PO TABS   Oral   Take 325 mg by mouth 2 (two) times daily.         Marland Kitchen METOPROLOL TARTRATE 25 MG PO TABS   Oral   Take 25 mg by mouth 2 (two) times daily.             There were no vitals taken for this visit.  Physical Exam  Nursing note and vitals reviewed. Constitutional: She appears well-developed  and well-nourished.       Thin-appearing female who appears to be in no acute distress, nontoxic  HENT:  Head: Normocephalic and atraumatic.       Nose: Small amount of dry blood noted to the anterior aspect of the right nostril area, no septal hematoma  Lips are try, oral mucosa moist  Eyes: Conjunctivae normal and EOM are normal. Pupils are equal, round, and reactive to light.  Neck: Normal range of motion. Neck supple.  Cardiovascular: Normal rate and regular rhythm.   Pulmonary/Chest: Effort normal and breath sounds normal. No respiratory distress. She exhibits no tenderness.  Abdominal: Soft. There is no tenderness.  Musculoskeletal: Normal range of motion. She exhibits no edema and no tenderness.  Neurological: She has normal strength. No cranial nerve deficit or sensory deficit. She displays a negative Romberg sign. Coordination and gait normal. GCS eye subscore is 4. GCS verbal subscore is 5. GCS motor subscore is 6. Right Babinski's sign: speech is dysarthric      ED Course  Procedures (including critical care time)  Labs Reviewed - No data to display No results found.   No diagnosis found.   Date: 01/09/2012  Rate: 66  Rhythm: normal sinus  QRS Axis: normal  Intervals: normal  ST/T Wave abnormalities: normal  Conduction Disutrbances: 1st degree AV block  Narrative Interpretation:   Old EKG Reviewed: unchanged  Results for orders placed during the hospital encounter of 01/09/12  CBC WITH DIFFERENTIAL      Component Value Range   WBC 3.6 (*) 4.0 - 10.5 K/uL   RBC 4.43  3.87 - 5.11 MIL/uL   Hemoglobin 12.4  12.0 - 15.0 g/dL   HCT 16.1  09.6 - 04.5 %   MCV 83.1  78.0 - 100.0 fL   MCH 28.0  26.0 - 34.0 pg   MCHC 33.7  30.0 - 36.0 g/dL   RDW 40.9  81.1 - 91.4 %   Platelets 183  150 - 400 K/uL   Neutrophils Relative 44  43 - 77 %   Neutro Abs 1.6 (*) 1.7 - 7.7 K/uL   Lymphocytes Relative 45  12 - 46 %   Lymphs Abs 1.6  0.7 - 4.0 K/uL   Monocytes Relative 9  3 - 12 %   Monocytes Absolute 0.3  0.1 - 1.0 K/uL   Eosinophils Relative 2  0 - 5 %   Eosinophils Absolute 0.1  0.0 - 0.7 K/uL   Basophils Relative 1  0 - 1 %   Basophils Absolute 0.0  0.0 - 0.1 K/uL  BASIC METABOLIC PANEL      Component Value Range   Sodium 136  135 - 145 mEq/L   Potassium 3.4 (*) 3.5 - 5.1 mEq/L   Chloride 99  96 - 112 mEq/L   CO2 27  19 - 32 mEq/L   Glucose, Bld 102 (*) 70 - 99 mg/dL   BUN 24 (*) 6 - 23 mg/dL   Creatinine, Ser 7.82 (*) 0.50 - 1.10 mg/dL   Calcium 9.0  8.4 - 95.6 mg/dL   GFR calc non Af Amer 28 (*) >90 mL/min   GFR calc Af Amer 32 (*) >90 mL/min  URINALYSIS, ROUTINE W REFLEX MICROSCOPIC      Component Value Range   Color, Urine YELLOW  YELLOW   APPearance CLEAR  CLEAR   Specific Gravity, Urine 1.012  1.005 - 1.030   pH 6.0  5.0 - 8.0   Glucose, UA NEGATIVE  NEGATIVE  mg/dL   Hgb urine dipstick NEGATIVE  NEGATIVE   Bilirubin Urine NEGATIVE  NEGATIVE   Ketones, ur NEGATIVE  NEGATIVE mg/dL   Protein, ur NEGATIVE  NEGATIVE mg/dL    Urobilinogen, UA 0.2  0.0 - 1.0 mg/dL   Nitrite NEGATIVE  NEGATIVE   Leukocytes, UA SMALL (*) NEGATIVE  TROPONIN I      Component Value Range   Troponin I <0.30  <0.30 ng/mL  URINE MICROSCOPIC-ADD ON      Component Value Range   Squamous Epithelial / LPF RARE  RARE   WBC, UA 3-6  <3 WBC/hpf   Bacteria, UA FEW (*) RARE   Mr Brain Wo Contrast  01/09/2012  *RADIOLOGY REPORT*  Clinical Data: Right-sided weakness now resolved.  Generalized weakness and confusion.  High blood pressure and chronic renal disease.  MRI HEAD WITHOUT CONTRAST  Technique:  Multiplanar, multiecho pulse sequences of the brain and surrounding structures were obtained according to standard protocol without intravenous contrast.  Comparison: 11/29/2010 MR.  Findings: No acute infarct.  Multiple remote infarcts with encephalomalacia and laminar necrosis involving portions of the frontal lobes bilaterally, left parietal lobe and occipital lobes bilaterally.  No hemorrhage separate from the small area of blood breakdown products noted at the level of the remote left parietal lobe infarct.  Global atrophy.  Patent cavum septum placenta et vergae without hydrocephalus.  No intracranial mass lesion detected on this unenhanced exam.  Partially empty sella probably an incidental finding and unchanged.  Major intracranial vascular structures are patent.  Previously noted atherosclerotic type changes not assessed on the present exam.  Mild paranasal sinus mucosal thickening.  Partial opacification mastoid air cells greater on the right without mass seen causing eustachian tube dysfunction.  IMPRESSION: No acute infarct.  Please see above for remainder of findings.   Original Report Authenticated By: Lacy Duverney, M.D.     1. Mild dehydration  MDM  76 year old female presents with some urinary discomfort and chief complaints of feeling confused. However patient is alert and oriented x3 without focal neuro deficit, normal gait. She has no  other specific complaint. She is afebrile vital signs stable. I will check H&H, check UA, check electrolytes. Her lips are dry however the oromucosa moist. Will give IV fluid.   1:25 PM Labs and UA is unremarkable. Will follow with a brain MRI for further evaluation. Discussed with my attending. IVF given for signs of dehydration.     3:19 PM Brain MRA results shows no acute infarct. Patient is currently stable to be discharged. Patient is recommended to follow with her PCP for further evaluation.  Pt able to tolerates PO.    BP 121/66  Pulse 74  Temp 98.5 F (36.9 C) (Oral)  Resp 16  SpO2 100%  I have reviewed nursing notes and vital signs. I personally reviewed the imaging tests through PACS system  I reviewed available ER/hospitalization records thought the EMR   Fayrene Helper, PA-C 01/09/12 1524

## 2012-01-09 NOTE — ED Notes (Signed)
Patient transported to MRI 

## 2012-01-09 NOTE — ED Notes (Signed)
Pt up to bathroom to void steady on feet assit x1

## 2012-01-10 LAB — URINE CULTURE

## 2012-01-10 NOTE — ED Provider Notes (Signed)
Medical screening examination/treatment/procedure(s) were conducted as a shared visit with non-physician practitioner(s) and myself.  I personally evaluated the patient during the encounter  The patient's MRI is without acute infarct.  Labs and urine without significant abnormality.  Close PCP followup  Lyanne Co, MD 01/10/12 [phone]

## 2012-02-23 ENCOUNTER — Emergency Department (HOSPITAL_COMMUNITY): Payer: PRIVATE HEALTH INSURANCE

## 2012-02-23 ENCOUNTER — Emergency Department (HOSPITAL_COMMUNITY)
Admission: EM | Admit: 2012-02-23 | Discharge: 2012-02-23 | Disposition: A | Discharge status: Home / Self Care | Payer: PRIVATE HEALTH INSURANCE | Attending: Emergency Medicine | Admitting: Emergency Medicine

## 2012-02-23 ENCOUNTER — Encounter (HOSPITAL_COMMUNITY): Payer: Self-pay | Admitting: Emergency Medicine

## 2012-02-23 DIAGNOSIS — Z860100 Personal history of colon polyps, unspecified: Secondary | ICD-10-CM | POA: Insufficient documentation

## 2012-02-23 DIAGNOSIS — I69922 Dysarthria following unspecified cerebrovascular disease: Secondary | ICD-10-CM | POA: Insufficient documentation

## 2012-02-23 DIAGNOSIS — Y9389 Activity, other specified: Secondary | ICD-10-CM | POA: Insufficient documentation

## 2012-02-23 DIAGNOSIS — Z8719 Personal history of other diseases of the digestive system: Secondary | ICD-10-CM | POA: Insufficient documentation

## 2012-02-23 DIAGNOSIS — I10 Essential (primary) hypertension: Secondary | ICD-10-CM | POA: Insufficient documentation

## 2012-02-23 DIAGNOSIS — Z79899 Other long term (current) drug therapy: Secondary | ICD-10-CM | POA: Insufficient documentation

## 2012-02-23 DIAGNOSIS — S8000XA Contusion of unspecified knee, initial encounter: Secondary | ICD-10-CM | POA: Insufficient documentation

## 2012-02-23 DIAGNOSIS — I69959 Hemiplegia and hemiparesis following unspecified cerebrovascular disease affecting unspecified side: Secondary | ICD-10-CM | POA: Insufficient documentation

## 2012-02-23 DIAGNOSIS — Z862 Personal history of diseases of the blood and blood-forming organs and certain disorders involving the immune mechanism: Secondary | ICD-10-CM | POA: Insufficient documentation

## 2012-02-23 DIAGNOSIS — R42 Dizziness and giddiness: Secondary | ICD-10-CM | POA: Insufficient documentation

## 2012-02-23 DIAGNOSIS — Y9210 Unspecified residential institution as the place of occurrence of the external cause: Secondary | ICD-10-CM | POA: Insufficient documentation

## 2012-02-23 DIAGNOSIS — W19XXXA Unspecified fall, initial encounter: Secondary | ICD-10-CM

## 2012-02-23 DIAGNOSIS — Z8659 Personal history of other mental and behavioral disorders: Secondary | ICD-10-CM | POA: Insufficient documentation

## 2012-02-23 DIAGNOSIS — I129 Hypertensive chronic kidney disease with stage 1 through stage 4 chronic kidney disease, or unspecified chronic kidney disease: Secondary | ICD-10-CM | POA: Insufficient documentation

## 2012-02-23 DIAGNOSIS — R296 Repeated falls: Secondary | ICD-10-CM | POA: Insufficient documentation

## 2012-02-23 DIAGNOSIS — J45909 Unspecified asthma, uncomplicated: Secondary | ICD-10-CM | POA: Insufficient documentation

## 2012-02-23 DIAGNOSIS — M19049 Primary osteoarthritis, unspecified hand: Secondary | ICD-10-CM | POA: Insufficient documentation

## 2012-02-23 DIAGNOSIS — Z7901 Long term (current) use of anticoagulants: Secondary | ICD-10-CM | POA: Insufficient documentation

## 2012-02-23 LAB — URINALYSIS, ROUTINE W REFLEX MICROSCOPIC
Glucose, UA: NEGATIVE mg/dL
Protein, ur: NEGATIVE mg/dL
Specific Gravity, Urine: 1.006 (ref 1.005–1.030)
Urobilinogen, UA: 0.2 mg/dL (ref 0.0–1.0)

## 2012-02-23 LAB — URINE MICROSCOPIC-ADD ON

## 2012-02-23 LAB — POCT I-STAT, CHEM 8
BUN: 18 mg/dL (ref 6–23)
Chloride: 95 mEq/L — ABNORMAL LOW (ref 96–112)
HCT: 38 % (ref 36.0–46.0)
Potassium: 3.4 mEq/L — ABNORMAL LOW (ref 3.5–5.1)
Sodium: 135 mEq/L (ref 135–145)

## 2012-02-23 NOTE — ED Provider Notes (Signed)
History     CSN: [id number]  Arrival date & time 02/23/12  4098   First MD Initiated Contact with Patient 02/23/12 [phone]      Chief Complaint  Patient presents with  . Fall  . Dizziness    (Consider location/radiation/quality/duration/timing/severity/associated sxs/prior treatment) HPI Comments: [name] is a 77 y.o. Female who was at home today, when she fell, while walking into her bathroom. Her daughter heard her fall and helped her up. The patient typically walks with a device, such as cane or walker, but did not use, one today. She injured her left arm and left knee in the fall. She denies headache, neck pain, or back pain. There was no prodrome. Her left knee hurts more when she moves it. It improves with rest. She did not eat this morning, and is hungry now. There are no other modifying factors.  Patient is a 76 y.o. female presenting with fall. The history is provided by the patient.  Fall    Past Medical History  Diagnosis Date  . Hypertension   . Stroke     tias, 2 strokes with right sided deficits and slurred speech  . Arthritis     hands  . Depression   . Asthma   . Hypochromic anemia   . Hyperlipidemia   . Hx of adenomatous colonic polyps   . Diverticulosis   . Chronic kidney disease (CKD), stage III (moderate)     Past Surgical History  Procedure Date  . Vascular surgery   . Abdominal hysterectomy   . Other surgical history     2 ear surgeries  . Tonsillectomy     Family History  Problem Relation Age of Onset  . Diabetes Paternal Aunt   . Colon cancer Neg Hx   . Stroke Father   . HIV Brother   . Heart disease Father   . Diabetes Father   . Prostate cancer    . Depression Father   . Cancer Sister     unknown type    History  Substance Use Topics  . Smoking status: Never Smoker   . Smokeless tobacco: Never Used  . Alcohol Use: No    OB History    Grav Para Term Preterm Abortions TAB SAB Ect Mult Living                   Review of Systems  All other systems reviewed and are negative.    Allergies  Aspirin  Home Medications   Current Outpatient Rx  Name  Route  Sig  Dispense  Refill  . CLOPIDOGREL BISULFATE 75 MG PO TABS   Oral   Take 75 mg by mouth daily.           Marland Kitchen DIPHENHYDRAMINE HCL 25 MG PO TABS   Oral   Take 50 mg by mouth every 6 (six) hours as needed. For allergies.         Marland Kitchen METOPROLOL TARTRATE 25 MG PO TABS   Oral   Take 25 mg by mouth 2 (two) times daily.           Marland Kitchen MEDICAL COMPRESSION STOCKINGS MISC   Does not apply   1 Device by Does not apply route daily. Use during day time remove for sleep at night time.   1 each   0     BP 143/65  Pulse 67  Temp 98.2 F (36.8 C) (Oral)  Resp 16  SpO2 100%  Physical Exam  Nursing note and vitals reviewed. Constitutional: She is oriented to person, place, and time. She appears well-developed and well-nourished.  HENT:  Head: Normocephalic and atraumatic.  Eyes: Conjunctivae normal and EOM are normal. Pupils are equal, round, and reactive to light.  Neck: Normal range of motion and phonation normal. Neck supple.  Cardiovascular: Normal rate, regular rhythm and intact distal pulses.   Pulmonary/Chest: Effort normal and breath sounds normal. She exhibits no tenderness.  Abdominal: Soft. She exhibits no distension. There is no tenderness. There is no guarding.  Musculoskeletal: Normal range of motion.       Left knee, moderately swollen, with normal active and passive range of motion. Left forearm, tender, without swelling, or deformity.  Neurological: She is alert and oriented to person, place, and time. She has normal strength. She exhibits normal muscle tone.  Skin: Skin is warm and dry.  Psychiatric: She has a normal mood and affect. Her behavior is normal. Judgment and thought content normal.    ED Course  Procedures (including critical care time)  Reevaluation: No further complaints, at discharge    Labs  Reviewed  URINALYSIS, ROUTINE W REFLEX MICROSCOPIC - Abnormal; Notable for the following:    APPearance CLOUDY (*)     Leukocytes, UA SMALL (*)     All other components within normal limits  POCT I-STAT, CHEM 8 - Abnormal; Notable for the following:    Potassium 3.4 (*)     Chloride 95 (*)     Creatinine, Ser 1.80 (*)     All other components within normal limits  URINE MICROSCOPIC-ADD ON - Abnormal; Notable for the following:    Bacteria, UA FEW (*)     All other components within normal limits  URINE CULTURE   Dg Knee Complete 4 Views Left  02/23/2012  *RADIOLOGY REPORT*  Clinical Data: Fall, left knee pain  LEFT KNEE - COMPLETE 4+ VIEW  Comparison: None.  Findings: Five views of the left knee submitted.  No acute fracture or subluxation.  Mild narrowing of medial joint compartment.  Mild spurring of medial femoral condyle.  Narrowing of patellofemoral joint space.  No joint effusion.  Atherosclerotic calcifications of femoral artery.  Diffuse osteopenia.  IMPRESSION: No acute fracture or subluxation.  Degenerative changes as described above.   Original Report Authenticated By: Natasha Mead, M.D.    Nursing notes, applicable records and vitals reviewed.  Radiologic Images/Reports reviewed.   1. Fall   2. Contusion, knee       MDM  Fall, without clear cause; negative screening exam for medical illness. Doubt fracture, systemic illness, or metabolic instability.     Plan: Home Medications- usual; Home Treatments- cryotherapy left knee; Recommended follow up- PCP, when necessary         Flint Melter, MD 02/23/12 1506

## 2012-02-23 NOTE — ED Notes (Addendum)
Pt denies any complaints at present.  No bumps or bleeding noted to side of head.  Pt denies loc. Pt also states L leg hit the wall when she fell.  L knee more swollen than R knee.  Full movement and sensation

## 2012-02-23 NOTE — ED Notes (Signed)
ZOX:[id number]<AV> Expected date:02/23/12<BR> Expected time:<BR> Means of arrival:Ambulance<BR> Comments:<BR> Dizziness/Fall

## 2012-02-23 NOTE — ED Notes (Signed)
Per EMS, pt went to the bathroom tried to get up became dizzy and sat back down on the toilet. States that she hit the left side of her head on the wall. No obvious injury. Complaints of bilateral knee pain.

## 2012-02-24 LAB — URINE CULTURE: Colony Count: 60000

## 2012-03-23 ENCOUNTER — Emergency Department (HOSPITAL_COMMUNITY): Payer: PRIVATE HEALTH INSURANCE

## 2012-03-23 ENCOUNTER — Encounter (HOSPITAL_COMMUNITY): Payer: Self-pay | Admitting: Vascular Surgery

## 2012-03-23 ENCOUNTER — Other Ambulatory Visit (HOSPITAL_COMMUNITY): Payer: PRIVATE HEALTH INSURANCE

## 2012-03-23 ENCOUNTER — Observation Stay (HOSPITAL_COMMUNITY)
Admission: EM | Admit: 2012-03-23 | Discharge: 2012-03-25 | Disposition: A | Discharge status: Home / Self Care | Payer: PRIVATE HEALTH INSURANCE | Attending: Internal Medicine | Admitting: Internal Medicine

## 2012-03-23 DIAGNOSIS — D509 Iron deficiency anemia, unspecified: Secondary | ICD-10-CM | POA: Diagnosis present

## 2012-03-23 DIAGNOSIS — E860 Dehydration: Secondary | ICD-10-CM

## 2012-03-23 DIAGNOSIS — M19049 Primary osteoarthritis, unspecified hand: Secondary | ICD-10-CM | POA: Insufficient documentation

## 2012-03-23 DIAGNOSIS — R5381 Other malaise: Secondary | ICD-10-CM | POA: Insufficient documentation

## 2012-03-23 DIAGNOSIS — E871 Hypo-osmolality and hyponatremia: Principal | ICD-10-CM | POA: Insufficient documentation

## 2012-03-23 DIAGNOSIS — N390 Urinary tract infection, site not specified: Secondary | ICD-10-CM | POA: Diagnosis present

## 2012-03-23 DIAGNOSIS — R29898 Other symptoms and signs involving the musculoskeletal system: Secondary | ICD-10-CM | POA: Insufficient documentation

## 2012-03-23 DIAGNOSIS — I69991 Dysphagia following unspecified cerebrovascular disease: Secondary | ICD-10-CM | POA: Insufficient documentation

## 2012-03-23 DIAGNOSIS — D649 Anemia, unspecified: Secondary | ICD-10-CM | POA: Insufficient documentation

## 2012-03-23 DIAGNOSIS — Z860100 Personal history of colon polyps, unspecified: Secondary | ICD-10-CM | POA: Insufficient documentation

## 2012-03-23 DIAGNOSIS — K5730 Diverticulosis of large intestine without perforation or abscess without bleeding: Secondary | ICD-10-CM | POA: Insufficient documentation

## 2012-03-23 DIAGNOSIS — E876 Hypokalemia: Secondary | ICD-10-CM | POA: Diagnosis present

## 2012-03-23 DIAGNOSIS — I69998 Other sequelae following unspecified cerebrovascular disease: Secondary | ICD-10-CM | POA: Insufficient documentation

## 2012-03-23 DIAGNOSIS — J45909 Unspecified asthma, uncomplicated: Secondary | ICD-10-CM | POA: Insufficient documentation

## 2012-03-23 DIAGNOSIS — E785 Hyperlipidemia, unspecified: Secondary | ICD-10-CM | POA: Insufficient documentation

## 2012-03-23 DIAGNOSIS — N183 Chronic kidney disease, stage 3 unspecified: Secondary | ICD-10-CM | POA: Insufficient documentation

## 2012-03-23 DIAGNOSIS — I129 Hypertensive chronic kidney disease with stage 1 through stage 4 chronic kidney disease, or unspecified chronic kidney disease: Secondary | ICD-10-CM | POA: Insufficient documentation

## 2012-03-23 DIAGNOSIS — I69922 Dysarthria following unspecified cerebrovascular disease: Secondary | ICD-10-CM | POA: Insufficient documentation

## 2012-03-23 DIAGNOSIS — I10 Essential (primary) hypertension: Secondary | ICD-10-CM | POA: Diagnosis present

## 2012-03-23 DIAGNOSIS — E43 Unspecified severe protein-calorie malnutrition: Secondary | ICD-10-CM | POA: Diagnosis present

## 2012-03-23 DIAGNOSIS — Z23 Encounter for immunization: Secondary | ICD-10-CM | POA: Insufficient documentation

## 2012-03-23 LAB — POCT I-STAT, CHEM 8
Calcium, Ion: 1.1 mmol/L — ABNORMAL LOW (ref 1.13–1.30)
Chloride: 86 mEq/L — ABNORMAL LOW (ref 96–112)
HCT: 37 % (ref 36.0–46.0)
Sodium: 125 mEq/L — ABNORMAL LOW (ref 135–145)
TCO2: 29 mmol/L (ref 0–100)

## 2012-03-23 LAB — CBC WITH DIFFERENTIAL/PLATELET
Basophils Absolute: 0 10*3/uL (ref 0.0–0.1)
Basophils Relative: 0 % (ref 0–1)
Eosinophils Absolute: 0 10*3/uL (ref 0.0–0.7)
Eosinophils Relative: 0 % (ref 0–5)
HCT: 32.9 % — ABNORMAL LOW (ref 36.0–46.0)
Hemoglobin: 11.7 g/dL — ABNORMAL LOW (ref 12.0–15.0)
Lymphocytes Relative: 21 % (ref 12–46)
Lymphs Abs: 1 10*3/uL (ref 0.7–4.0)
MCH: 27.8 pg (ref 26.0–34.0)
MCHC: 35.6 g/dL (ref 30.0–36.0)
MCV: 78.1 fL (ref 78.0–100.0)
Monocytes Absolute: 0.4 10*3/uL (ref 0.1–1.0)
Monocytes Relative: 9 % (ref 3–12)
Neutro Abs: 3.1 10*3/uL (ref 1.7–7.7)
Neutrophils Relative %: 69 % (ref 43–77)
Platelets: 167 10*3/uL (ref 150–400)
RBC: 4.21 MIL/uL (ref 3.87–5.11)
RDW: 12.8 % (ref 11.5–15.5)
WBC: 4.5 10*3/uL (ref 4.0–10.5)

## 2012-03-23 LAB — URINALYSIS, ROUTINE W REFLEX MICROSCOPIC
Glucose, UA: NEGATIVE mg/dL
Ketones, ur: NEGATIVE mg/dL
Nitrite: NEGATIVE
Protein, ur: NEGATIVE mg/dL
pH: 5.5 (ref 5.0–8.0)

## 2012-03-23 LAB — POCT I-STAT TROPONIN I: Troponin i, poc: 0.03 ng/mL (ref 0.00–0.08)

## 2012-03-23 MED ORDER — POTASSIUM CHLORIDE CRYS ER 20 MEQ PO TBCR: 40.0000 meq | EXTENDED_RELEASE_TABLET | Freq: Once | ORAL | Status: DC

## 2012-03-23 MED ORDER — ONDANSETRON HCL 4 MG/2ML IJ SOLN
4.0000 mg | Freq: Four times a day (QID) | INTRAMUSCULAR | Status: DC | PRN
  Administered 2012-03-24: 4 mg via INTRAVENOUS
  Filled 2012-03-23: qty 2

## 2012-03-23 MED ORDER — POTASSIUM CHLORIDE 10 MEQ/100ML IV SOLN
10.0000 meq | Freq: Once | INTRAVENOUS | Status: AC
  Administered 2012-03-23: 10 meq via INTRAVENOUS
  Filled 2012-03-23: qty 100

## 2012-03-23 MED ORDER — POTASSIUM CHLORIDE CRYS ER 20 MEQ PO TBCR
40.0000 meq | EXTENDED_RELEASE_TABLET | ORAL | Status: DC
  Administered 2012-03-24: 40 meq via ORAL
  Filled 2012-03-23: qty 2

## 2012-03-23 MED ORDER — ACETAMINOPHEN 650 MG RE SUPP: 650.0000 mg | Freq: Four times a day (QID) | RECTAL | Status: DC | PRN

## 2012-03-23 MED ORDER — HEPARIN SODIUM (PORCINE) 5000 UNIT/ML IJ SOLN
5000.0000 [IU] | Freq: Three times a day (TID) | INTRAMUSCULAR | Status: DC
  Administered 2012-03-24 – 2012-03-25 (×4): 5000 [IU] via SUBCUTANEOUS
  Filled 2012-03-23 (×7): qty 1

## 2012-03-23 MED ORDER — ACETAMINOPHEN 325 MG PO TABS
650.0000 mg | ORAL_TABLET | Freq: Four times a day (QID) | ORAL | Status: DC | PRN
  Administered 2012-03-25: 650 mg via ORAL
  Filled 2012-03-23: qty 2

## 2012-03-23 MED ORDER — ONDANSETRON HCL 4 MG PO TABS: 4.0000 mg | ORAL_TABLET | Freq: Four times a day (QID) | ORAL | Status: DC | PRN

## 2012-03-23 MED ORDER — POTASSIUM CHLORIDE 20 MEQ/15ML (10%) PO LIQD
40.0000 meq | ORAL | Status: DC
  Filled 2012-03-23 (×2): qty 30

## 2012-03-23 MED ORDER — DEXTROSE 5 % IV SOLN
1.0000 g | INTRAVENOUS | Status: DC
  Administered 2012-03-24 (×2): 1 g via INTRAVENOUS
  Filled 2012-03-23 (×3): qty 10

## 2012-03-23 MED ORDER — METOPROLOL TARTRATE 25 MG PO TABS
25.0000 mg | ORAL_TABLET | Freq: Two times a day (BID) | ORAL | Status: DC
  Administered 2012-03-24 – 2012-03-25 (×4): 25 mg via ORAL
  Filled 2012-03-23 (×5): qty 1

## 2012-03-23 MED ORDER — CLOPIDOGREL BISULFATE 75 MG PO TABS
75.0000 mg | ORAL_TABLET | Freq: Every day | ORAL | Status: DC
  Administered 2012-03-24 – 2012-03-25 (×2): 75 mg via ORAL
  Filled 2012-03-23 (×2): qty 1

## 2012-03-23 MED ORDER — SODIUM CHLORIDE 0.9 % IV SOLN
INTRAVENOUS | Status: DC
  Administered 2012-03-24: 01:00:00 via INTRAVENOUS

## 2012-03-23 NOTE — ED Provider Notes (Signed)
History     CSN: [id number]  Arrival date & time 03/23/12  1524   First MD Initiated Contact with Patient 03/23/12 1529      Chief Complaint  Patient presents with  . Cerebrovascular Accident     HPI: [name] 77yo woman pmh of multiple TIAs and strokes with residual right sided deficits and dysarthria and dyspahgia, chronic anemia and recurrent diverticulitis s/p colectomy in 2012 presented from home by EMS. Family member called EMS for apparent stroke-like symptoms that happened over past 2 days. Family member is not present and hx provided by the patient. Pt states having fallen back in January and has felt weak since that time. She uses a walker at baseline at home but felt a little more weak since then. She has been compliant with her medications and takes Plavix. She has noticed a new cough, urinary frequency, suprapubic pain, and subjective fevers and chills throughout this week as well. Pt denies any sick contacts and didn't receive the flu shot this year. Pt lives with daughter who is pts caregiver.   Past Medical History  Diagnosis Date  . Hypertension   . Stroke     tias, 2 strokes with right sided deficits and slurred speech  . Arthritis     hands  . Depression   . Asthma   . Hypochromic anemia   . Hyperlipidemia   . Hx of adenomatous colonic polyps   . Diverticulosis   . Chronic kidney disease (CKD), stage III (moderate)     Past Surgical History  Procedure Laterality Date  . Vascular surgery    . Abdominal hysterectomy    . Other surgical history      2 ear surgeries  . Tonsillectomy    . Abdominal surgery    . Colectomy      Family History  Problem Relation Age of Onset  . Diabetes Paternal Aunt   . Colon cancer Neg Hx   . Stroke Father   . HIV Brother   . Heart disease Father   . Diabetes Father   . Prostate cancer    . Depression Father   . Cancer Sister     unknown type    History  Substance Use Topics  . Smoking status: Never Smoker    . Smokeless tobacco: Never Used  . Alcohol Use: No    OB History   Grav Para Term Preterm Abortions TAB SAB Ect Mult Living                  Review of Systems otherwise negative unless listed in HPI  Allergies  Aspirin  Home Medications   Current Outpatient Rx  Name  Route  Sig  Dispense  Refill  . clopidogrel (PLAVIX) 75 MG tablet   Oral   Take 75 mg by mouth daily.           . metoprolol tartrate (LOPRESSOR) 25 MG tablet   Oral   Take 25 mg by mouth 2 (two) times daily.             BP 128/56  Pulse 70  Temp(Src) 97.9 F (36.6 C) (Oral)  Resp 20  SpO2 100%  Physical Exam  Constitutional: She is oriented to person, place, and time. She appears cachectic. She is cooperative.  HENT:  Head: Normocephalic.  Eyes: Pupils are equal, round, and reactive to light. Right eye exhibits normal extraocular motion. Left eye exhibits normal extraocular motion. Right pupil is round and reactive.  Left pupil is round and reactive. Pupils are equal.  Neck: Normal range of motion.  Cardiovascular: Normal rate, regular rhythm, S1 normal and S2 normal.  Exam reveals no S3 and no S4.   No murmur heard. Pulmonary/Chest: Breath sounds normal.  Abdominal: Soft. Bowel sounds are normal. There is tenderness in the suprapubic area. There is no rebound and no guarding.  Pt has longitudinal midline well healing abdominal scar   Neurological: She is alert and oriented to person, place, and time. No cranial nerve deficit.  Skin: Skin is warm. No rash noted.    ED Course  Procedures (including critical care time)  Labs Reviewed  CBC WITH DIFFERENTIAL - Abnormal; Notable for the following:    Hemoglobin 11.7 (*)    HCT 32.9 (*)    All other components within normal limits  URINALYSIS, ROUTINE W REFLEX MICROSCOPIC - Abnormal; Notable for the following:    APPearance CLOUDY (*)    Hgb urine dipstick SMALL (*)    Leukocytes, UA MODERATE (*)    All other components within normal limits   URINE MICROSCOPIC-ADD ON - Abnormal; Notable for the following:    Squamous Epithelial / LPF FEW (*)    Casts HYALINE CASTS (*)    All other components within normal limits  POCT I-STAT, CHEM 8 - Abnormal; Notable for the following:    Sodium 125 (*)    Potassium 2.9 (*)    Chloride 86 (*)    Creatinine, Ser 1.60 (*)    Glucose, Bld 109 (*)    Calcium, Ion 1.10 (*)    All other components within normal limits  URINE CULTURE  POCT I-STAT TROPONIN I   Dg Chest 2 View  03/23/2012  *RADIOLOGY REPORT*  Clinical Data: CVA, cough.  CHEST - 2 VIEW  Comparison: 11/29/2010  Findings: Heart and mediastinal contours are within normal limits. No focal opacities or effusions.  No acute bony abnormality.  IMPRESSION: No active cardiopulmonary disease.   Original Report Authenticated By: Charlett Nose, M.D.    Ct Head Wo Contrast  03/23/2012  *RADIOLOGY REPORT*  Clinical Data: 77 year old female with stroke-like symptoms.  New onset headache.  Abnormal gait and blurred vision.  CT HEAD WITHOUT CONTRAST  Technique:  Contiguous axial images were obtained from the base of the skull through the vertex without contrast.  Comparison: Brain MRI 01/09/2012 and earlier.  Findings: Visualized paranasal sinuses and mastoids are clear.  No acute orbit or scalp soft tissue findings. No acute osseous abnormality identified.  Calcified atherosclerosis at the skull base.  Multi focal chronic cortical infarct.  Posterior left MCA and right PCA territory is worst affected.  Right MCA, left ACA, and left PCA territory is also involved.  Stable cerebral volume.  Cavum septum pellucidum normal variant. Stable ventricle size and configuration.  No midline shift, mass effect, or evidence of mass lesion.  No acute intracranial hemorrhage identified.  No suspicious intracranial vascular hyperdensity. No evidence of cortically based acute infarction identified.  IMPRESSION: Numerous bilateral chronic cortical infarcts. No acute intracranial  abnormality.   Original Report Authenticated By: Erskine Speed, M.D.      1. Dehydration   2. Hyponatremia   3. Hypokalemia     EKG: NSR with 1st degree AV block unchanged from previous and some U waves in lead II  MDM  [name] 77 yo woman pmh of multiple TIAs and stroke presented with weakness found to be hyponatremic and hypokalemic. CT was negative, CXR negative, CBC  stable Hgb 12.0. Pt potassium was replaced and pt was feeling better. Admitted to IMTS for observation.   Pt was seen and discussed with Dr. Renee Rival, MD 03/23/12 [phone]

## 2012-03-23 NOTE — H&P (Signed)
Hospital Admission Note Date: 03/23/2012  Patient name: [name] Medical record number: [id number] Date of birth: 07-27-1935 Age: 77 y.o. Gender: female PCP: [name], MD   Service:  Internal Medicine Teaching Service   Attending Physician:  Dr. Debe Coder    Chief Complaint:  weakness    History of Present Illness:  77 year old woman history of stroke, chronic kidney disease, dementia, and hypertension who was brought to the emergency department at the request of the patient's caretaker. The caretaker felt she had been weak for the past few days and wanted her evaluated. The patient reports "not feeling well" today. On review of systems, she reports a decreased appetite for months now, poor oral intake, increased urinary frequency, abdominal pain, and dizziness today. She denies chest pain, dyspnea, vomiting, diarrhea, falls, and loss of consciousness.     Review of Systems:   Constitutional: Negative for fever and chills.  HENT: Positive for congestion. Negative for hearing loss, ear pain and ear discharge.   Eyes: Negative. No acute changes  Respiratory: Negative.  Negative for cough, sputum production and shortness of breath.   Cardiovascular: Negative.  Negative for chest pain.  Gastrointestinal: Positive for abdominal pain. Negative for nausea, vomiting and diarrhea.  Genitourinary: Positive for frequency. Negative for dysuria and urgency.  Musculoskeletal: Negative.  Negative for myalgias and falls.  Neurological: Positive for dizziness and weakness. Negative for loss of consciousness and headaches.      Medical History: Past Medical History  Diagnosis Date  . Hypertension   . Stroke     tias, 2 strokes with right sided deficits and slurred speech  . Arthritis     hands  . Depression   . Asthma   . Hypochromic anemia   . Hyperlipidemia   . Hx of adenomatous colonic polyps   . Diverticulosis   . Chronic kidney disease (CKD), stage III (moderate)      Surgical History: Past Surgical History  Procedure Laterality Date  . Vascular surgery    . Abdominal hysterectomy    . Other surgical history      2 ear surgeries  . Tonsillectomy    . Abdominal surgery    . Colectomy      Home Medications: 1. Clopidogrel 75 mg daily 2. Metoprolol tartrate 25 mg twice a day   Allergies: Allergies as of 03/23/2012 - Review Complete 03/23/2012  Allergen Reaction Noted  . Aspirin Nausea And Vomiting     Family History: Family History  Problem Relation Age of Onset  . Diabetes Paternal Aunt   . Colon cancer Neg Hx   . Stroke Father   . HIV Brother   . Heart disease Father   . Diabetes Father   . Prostate cancer    . Depression Father   . Cancer Sister     unknown type    Social History: Social History  . Marital Status: Single   Social History Main Topics  . Smoking status: Never Smoker   . Smokeless tobacco: Never Used  . Alcohol Use: No  . Drug Use: No  . Sexually Active: No    Physical exam:  03/23/12 1915  BP: 120/56  Pulse: 63  Temp: 97.23F   TempSrc: Oral  Resp: 12  SpO2: 100%    GENERAL: thin; no acute distress HEAD: atraumatic, normocephalic EYES: pupils 1 mm, equal, round and reactive; sclera anicteric; normal conjunctiva EARS: canals patent and TMs normal bilaterally NOSE/THROAT: oropharynx clear, moist mucous membranes, pink gums, dentures  in place NECK: supple, no carotid bruits, thyroid normal in size and without palpable nodules LYMPH: no cervical or supraclavicular lymphadenopathy LUNGS: clear to auscultation bilaterally, normal work of breathing HEART: normal rate and regular rhythm; normal S1 and S2 without S3 or S4; no murmurs, rubs, or clicks PULSES: radial, dorsalis pedis, and posterior tibial pulses are 2+ and symmetric ABDOMEN: soft, tender with palpation of the suprapubic and periumbilical area, normal bowel sounds, no masses, no guarding or rigidity MOTOR: 5/5 grip strength on the left,  4/5 on the right; 4/5 hip flexion bilaterally; 5/5 plantarflexion bilaterally SENSATION: intact but decreased on the right side the upper and lower extremities REFLEXES: unable to elicit patellar reflexes CRANIAL NERVES: pupils reactive to light bilaterally; extra occular muscles are intact; uvula is midline and palate elevates symmetrically SKIN: warm, dry, normal turgor, no rashes, healing ulcer over the left lateral malleolus, skin is otherwise intact EXTREMITIES: 1+ pitting edema in the lower extremities bilaterally, no clubbing or cyanosis PSYCH: patient is alert and oriented, mood and affect are normal and congruent     Lab results: Basic Metabolic Panel:  Recent Labs  11/91/47 1638  NA 125*  K 2.9*  CL 86*  GLUCOSE 109*  BUN 12  CREATININE 1.60*   CBC:  Recent Labs  03/23/12 1614 03/23/12 1638  WBC 4.5  --   NEUTROABS 3.1  --   HGB 11.7* 12.6  HCT 32.9* 37.0  MCV 78.1  --   PLT 167  --    Cardiac Enzymes: Point-of-care troponins 0.03  Urinalysis:  Recent Labs  03/23/12 1649  COLORURINE YELLOW  LABSPEC 1.009  PHURINE 5.5  GLUCOSEU NEGATIVE  HGBUR SMALL*  BILIRUBINUR NEGATIVE  KETONESUR NEGATIVE  PROTEINUR NEGATIVE  UROBILINOGEN 0.2  NITRITE NEGATIVE  LEUKOCYTESUR MODERATE*  Microscopy  Rare yeast   WBCs  7-10   RBCs  0-2   Epithelial cells  Few   Casts  Hyaline     Imaging results: Dg Chest 2 View 03/23/2012 FINDINGS: Heart and mediastinal contours are within normal limits. No focal opacities or effusions.  No acute bony abnormality.   IMPRESSION: No active cardiopulmonary disease.   Ct Head Wo Contrast 03/23/2012   FINDINGS: Visualized paranasal sinuses and mastoids are clear.  No acute orbit or scalp soft tissue findings. No acute osseous abnormality identified.  Calcified atherosclerosis at the skull base.  Multi focal chronic cortical infarct.  Posterior left MCA and right PCA territory is worst affected.  Right MCA, left ACA, and left PCA  territory is also involved.  Stable cerebral volume.  Cavum septum pellucidum normal variant. Stable ventricle size and configuration.  No midline shift, mass effect, or evidence of mass lesion.  No acute intracranial hemorrhage identified.  No suspicious intracranial vascular hyperdensity. No evidence of cortically based acute infarction identified.   IMPRESSION: Numerous bilateral chronic cortical infarcts. No acute intracranial abnormality.     Other results: EKG Results:  03/23/2012 Rate:  77 PR:  236 QRS:  102 QTc:  480 Axis:  Normal EKG: Sinus rhythm with first degree AV block, U waves present , borderline QT prolongation, stable from prior tracings.   Assessment and Plan:  1.   Deconditioning:  With history of stroke, patient is unable to ambulate about as she used to. This has lead to physical deconditioning. Compounded this problem is a decreased appetite and resultant decreased in food intake. We will have nutrition and physical therapy see her. - PT and OT consultation - Nutrition consultation  2.   Hyponatremia:  Sodium 125 at presentation. Baseline 135-145. Appears hypovolemic on exam. We are checking urine osmolality and urine sodium but this most likely represents hypovolemia from poor oral intake and decreased appetite. Her creatinine and BUN are not elevated, which we would expect. It is possible she has a euvolemic hyponatremia; we will watch her sodium closely as we hydrate with intravenous fluids and check a serum cortisol and TSH. - IV normal saline 75 mL/hr - AMCortisol  - TSH - Urine sodium, osmolality, and creatinine  3.   Hypokalemia:  Potassium 2.9 at presentation. Baseline 3.5-4.5. Most likely secondary to poor food and fluid intake. We will check urine potassium and a serum magnesium. We will replace with oral potassium chloride.  - 40 mEq potassium chloride orally every 4 hours for 2 doses - Magnesium level - Urine potassium  4.   Urinary tract infection:  Patient reports increased urinary frequency and suprapubic abdominal tenderness. She denies dysuria. Urinalysis findings are suspicious for a urinary tract infection but not overwhelmingly so. We will treat with IV ceftriaxone and await urine culture results.  - IV ceftriaxone - Urine culture pending  5.   Stage 3 chronic kidney disease:  Baseline creatinine in the range of 1.4-1.8. At presentation, her creatinine was 1.6. Estimated GFR is 32, representing stage III chronic kidney disease. No evidence of superimposed acute kidney injury.   6.   Hypertension:  At home, she takes metoprolol tartrate 25 mg twice a day. Her blood pressures here have been within normal limits. We will continue this treatment. Over the past year, her blood pressures appear to have been reasonably well controlled with an average low pressure around 135/70.  - Continue metoprolol tartrate 25 mg twice a day  7.   History of stroke:  At home, she takes clopidogrel 75 mg a day. Has residual right sided weakness and numbness. - Continue clopidogrel 75 mg daily   8.   Prophylaxis:  Heparin 5000 units [place] every 8 hours   9.   Disposition:  Patient has an unassigned PCP (Dr. Leilani Able) so will not require OPC followup. Requires assistance with transportation. May require outpatient services after discharge; OT and PT have been consulted. Expected length of stay less than 2 days.     Signed by:  Dorthula Rue. Earlene Plater, MD PGY-I, Internal Medicine  03/23/2012, 8:34 PM

## 2012-03-23 NOTE — ED Notes (Addendum)
Pt reports to the ED for eval of stroke like symptoms since yesterday afternoon family member and pt are unsure of the exact LSN. Per EMS pt has new onset HA but pt reports it hurts all the time. She states that she just hasn't been herself. Pt also has abnormal gait and blurred vision. However, she states this has been ongoing for several weeks. Pt also reports tenderness to palpation on the right frontal area of her head. Reports she had shingles in the affected area and the pain may be from that. Pt has residual right sided weakness and dysphasia from previous CVA. Per EMS pts pupils equal and reactive. Pt is A&O x 3. Pt denies any CP or SOB. At this time pt only complaining of left elbow pain from a fall about a month ago.

## 2012-03-24 ENCOUNTER — Encounter (HOSPITAL_COMMUNITY): Payer: Self-pay | Admitting: *Deleted

## 2012-03-24 ENCOUNTER — Observation Stay (HOSPITAL_COMMUNITY): Payer: PRIVATE HEALTH INSURANCE

## 2012-03-24 DIAGNOSIS — E43 Unspecified severe protein-calorie malnutrition: Secondary | ICD-10-CM | POA: Diagnosis present

## 2012-03-24 LAB — OSMOLALITY, URINE: Osmolality, Ur: 157 mOsm/kg — ABNORMAL LOW (ref 390–1090)

## 2012-03-24 LAB — BASIC METABOLIC PANEL
BUN: 12 mg/dL (ref 6–23)
Chloride: 94 mEq/L — ABNORMAL LOW (ref 96–112)
Creatinine, Ser: 1.51 mg/dL — ABNORMAL HIGH (ref 0.50–1.10)
GFR calc Af Amer: 38 mL/min — ABNORMAL LOW (ref >90)
GFR calc non Af Amer: 33 mL/min — ABNORMAL LOW (ref >90)
Glucose, Bld: 139 mg/dL — ABNORMAL HIGH (ref 70–99)

## 2012-03-24 LAB — CBC WITH DIFFERENTIAL/PLATELET
Basophils Absolute: 0 10*3/uL (ref 0.0–0.1)
Basophils Relative: 1 % (ref 0–1)
Eosinophils Absolute: 0 10*3/uL (ref 0.0–0.7)
Eosinophils Relative: 1 % (ref 0–5)
MCH: 27.3 pg (ref 26.0–34.0)
MCV: 77.8 fL — ABNORMAL LOW (ref 78.0–100.0)
Neutrophils Relative %: 55 % (ref 43–77)
Platelets: 151 10*3/uL (ref 150–400)
RDW: 12.9 % (ref 11.5–15.5)

## 2012-03-24 LAB — CORTISOL: Cortisol, Plasma: 12.6 ug/dL

## 2012-03-24 LAB — COMPREHENSIVE METABOLIC PANEL
AST: 19 U/L (ref 0–37)
Albumin: 2.8 g/dL — ABNORMAL LOW (ref 3.5–5.2)
Calcium: 8.7 mg/dL (ref 8.4–10.5)
GFR calc non Af Amer: 35 mL/min — ABNORMAL LOW (ref >90)
Potassium: 3.1 mEq/L — ABNORMAL LOW (ref 3.5–5.1)
Sodium: 124 mEq/L — ABNORMAL LOW (ref 135–145)
Total Bilirubin: 0.2 mg/dL — ABNORMAL LOW (ref 0.3–1.2)
Total Protein: 5.6 g/dL — ABNORMAL LOW (ref 6.0–8.3)

## 2012-03-24 LAB — NA AND K (SODIUM & POTASSIUM), RAND UR: Potassium Urine: 7 mEq/L

## 2012-03-24 LAB — MAGNESIUM: Magnesium: 1.9 mg/dL (ref 1.5–2.5)

## 2012-03-24 MED ORDER — POTASSIUM CHLORIDE 20 MEQ/15ML (10%) PO LIQD
40.0000 meq | Freq: Once | ORAL | Status: AC
  Administered 2012-03-24: 40 meq via ORAL
  Filled 2012-03-24: qty 30

## 2012-03-24 MED ORDER — INFLUENZA VIRUS VACC SPLIT PF IM SUSP
0.5000 mL | Freq: Once | INTRAMUSCULAR | Status: AC
  Administered 2012-03-24: 0.5 mL via INTRAMUSCULAR
  Filled 2012-03-24: qty 0.5

## 2012-03-24 MED ORDER — POTASSIUM CHLORIDE CRYS ER 20 MEQ PO TBCR: 40.0000 meq | EXTENDED_RELEASE_TABLET | Freq: Once | ORAL | Status: AC

## 2012-03-24 MED ORDER — POTASSIUM CHLORIDE CRYS ER 20 MEQ PO TBCR
40.0000 meq | EXTENDED_RELEASE_TABLET | Freq: Once | ORAL | Status: AC
  Administered 2012-03-24: 40 meq via ORAL
  Filled 2012-03-24: qty 2

## 2012-03-24 MED ORDER — POTASSIUM CHLORIDE 20 MEQ/15ML (10%) PO LIQD
40.0000 meq | Freq: Once | ORAL | Status: AC
  Filled 2012-03-24: qty 30

## 2012-03-24 NOTE — Progress Notes (Signed)
Subjective: She states feeling fine today. She reports having a fall last month with left ankle injury and difficulty walking since then. She states that she has going to the bathroom a lot and feel in the bathroom hitting a sharp corner with her lateral ankle with a wound at that area. She has applied Vaseline to the lateral malleolus wound and denies recent discharge or bleeding from the wound.  She also reports increasing her water intake since the development of her increased urinary frequency. Her appetite has been decreased for a while but she wants to eat more as she is concerned about her weakness.   She reports active dysuria, suprapubic pain, and increased frequency.  Objective: Vital signs in last 24 hours: Filed Vitals:   03/24/12 0012 03/24/12 0521 03/24/12 0735 03/24/12 1500  BP: 125/67 111/54 135/58 140/60  Pulse: 69 57 77 81  Temp: 98.1 F (36.7 C) 98.3 F (36.8 C) 98.4 F (36.9 C) 98.5 F (36.9 C)  TempSrc: Oral Oral Oral Oral  Resp: 18 18 18 18   Height:      Weight:      SpO2: 100% 100% 100% 100%   Weight change:   Intake/Output Summary (Last 24 hours) at 03/24/12 1555 Last data filed at 03/24/12 1500  Gross per 24 hour  Intake 2081.25 ml  Output    678 ml  Net 1403.25 ml   Vitals reviewed. General: Sitting in bed, in NAD HEENT: PERRL, no scleral icterus. MMM Cardiac: RRR, no rubs, murmurs or gallops Pulm: clear to auscultation bilaterally, no wheezes, rales, or rhonchi Abd: soft, nontender, nondistended, BS present. Suprapubic tenderness Ext: warm and well perfused, trace pretibial edema. Left foot is inverted. Left ankle with lateral malleolus wound with dry scab with no discharge or bleeding. Tenderness upon palpation of the entire lateral malleolus with surrounding edema and darker discoloration. Right ankle with no edema, not TTP.  Neuro: Dysarthria, Alert and oriented X3, UE strength 4/5 on right with decreased grip strength. LE with 4/5 strength  bilaterally. She moves all 4 extremities voluntarily.   Lab Results: Basic Metabolic Panel:  Recent Labs Lab 03/24/12 0241 03/24/12 0954  NA 124* 131*  K 3.1* 3.4*  CL 88* 94*  CO2 28 30  GLUCOSE 99 139*  BUN 14 12  CREATININE 1.45* 1.51*  CALCIUM 8.7 9.0  MG 1.9  --    Liver Function Tests:  Recent Labs Lab 03/24/12 0241  AST 19  ALT 7  ALKPHOS 45  BILITOT 0.2*  PROT 5.6*  ALBUMIN 2.8*   CBC:  Recent Labs Lab 03/23/12 1614 03/23/12 1638 03/24/12 0241  WBC 4.5  --  3.9*  NEUTROABS 3.1  --  2.1  HGB 11.7* 12.6 10.7*  HCT 32.9* 37.0 30.5*  MCV 78.1  --  77.8*  PLT 167  --  151   Thyroid Function Tests:  Recent Labs Lab 03/24/12 0241  TSH 0.806   Urinalysis:  Recent Labs Lab 03/23/12 1649  COLORURINE YELLOW  LABSPEC 1.009  PHURINE 5.5  GLUCOSEU NEGATIVE  HGBUR SMALL*  BILIRUBINUR NEGATIVE  KETONESUR NEGATIVE  PROTEINUR NEGATIVE  UROBILINOGEN 0.2  NITRITE NEGATIVE  LEUKOCYTESUR MODERATE*     Micro Results: Recent Results (from the past 240 hour(s))  MRSA PCR SCREENING     Status: None   Collection Time    03/23/12  9:54 PM      Result Value Range Status   MRSA by PCR NEGATIVE  NEGATIVE Final   Comment:  The GeneXpert MRSA Assay (FDA     approved for NASAL specimens     only), is one component of a     comprehensive MRSA colonization     surveillance program. It is not     intended to diagnose MRSA     infection nor to guide or     monitor treatment for     MRSA infections.   Studies/Results: Dg Chest 2 View  03/23/2012  *RADIOLOGY REPORT*  Clinical Data: CVA, cough.  CHEST - 2 VIEW  Comparison: 11/29/2010  Findings: Heart and mediastinal contours are within normal limits. No focal opacities or effusions.  No acute bony abnormality.  IMPRESSION: No active cardiopulmonary disease.   Original Report Authenticated By: Charlett Nose, M.D.    Ct Head Wo Contrast  03/23/2012  *RADIOLOGY REPORT*  Clinical Data: 77 year old female  with stroke-like symptoms.  New onset headache.  Abnormal gait and blurred vision.  CT HEAD WITHOUT CONTRAST  Technique:  Contiguous axial images were obtained from the base of the skull through the vertex without contrast.  Comparison: Brain MRI 01/09/2012 and earlier.  Findings: Visualized paranasal sinuses and mastoids are clear.  No acute orbit or scalp soft tissue findings. No acute osseous abnormality identified.  Calcified atherosclerosis at the skull base.  Multi focal chronic cortical infarct.  Posterior left MCA and right PCA territory is worst affected.  Right MCA, left ACA, and left PCA territory is also involved.  Stable cerebral volume.  Cavum septum pellucidum normal variant. Stable ventricle size and configuration.  No midline shift, mass effect, or evidence of mass lesion.  No acute intracranial hemorrhage identified.  No suspicious intracranial vascular hyperdensity. No evidence of cortically based acute infarction identified.  IMPRESSION: Numerous bilateral chronic cortical infarcts. No acute intracranial abnormality.   Original Report Authenticated By: Erskine Speed, M.D.    Medications: I have reviewed the patient's current medications. Scheduled Meds: . cefTRIAXone (ROCEPHIN)  IV  1 g Intravenous Q24H  . clopidogrel  75 mg Oral Daily  . heparin  5,000 Units Subcutaneous Q8H  . metoprolol tartrate  25 mg Oral BID   Continuous Infusions: . sodium chloride 75 mL/hr at 03/24/12 0043   PRN Meds:.acetaminophen, acetaminophen, ondansetron (ZOFRAN) IV, ondansetron Assessment/Plan:  Hyponatremia. Improving. 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 Na on presentation at 125. She reports today having large intake of water recently to compensate for her increased urinary frequency. Her Na is 131 this morning. TSH normal/low at 0.806, urine osmolality low.  -fluid restriction, UTI treatment per below  Hypokalemia. K of 2.9 on presentation, likely 2/2 to reported decreased intake of foods  due to recent decreased appetite. K of 3.4 today. Will continue repletion with PO K.  -BMET in AM   UTI. Pt with dysuria, increased urinary frequency, and suprapubic tenderness, UA shows moderate leukocytes, 7-10 WBC.  -Continue empiric coverage with Rocephin -f/u urine culture  Recent Fall. She reports recent mechanic fall last month while going to the bathroom with left ankle injury. She reports this was related to increased urinary frequency with urgency to go to the bathroom and poor vision. She has had decreased ambulation since then.  -Left foot X-ray -Will consider wound care consult for left wound -PT/OT   Weakness/Deconditioning.  She reports not waling as much since her mechanical fall about one month ago. She also has history of several strokes. She has a walker at home but reports not moving around as much recently.  -PT with recommendation for  HH PT  HTN. BP well controlled overnight. Continue Lopressor 25mg  BID  Protein Malnutrition. Albumin of 2.8, BMI of 18.4. She reports decreased appetite.  -Nutrition consult  CKD stage 3. Cr on presentation 1.6, at baseline of 1.4-1.8.  -BMET in AM  History of Stroke. Residual right sided weakness. -Continue Plavix 75mg  daily.  -PT/OT  Dispo: Disposition is deferred at this time, awaiting improvement of current medical problems.  Anticipated discharge in approximately 1-2 day(s).   The patient does have a current PCP (REESE,BETTI D, MD), therefore will not be requiring OPC follow-up after discharge.   The patient does not have transportation limitations that hinder transportation to clinic appointments.  .Services Needed at time of discharge: Y = Yes, Blank = No PT:   OT:   RN:   Equipment:   Other:     LOS: 1 day   Sara Chu D 03/24/2012, 3:55 PM

## 2012-03-24 NOTE — Evaluation (Signed)
Physical Therapy Evaluation Patient Details Name: [name] MRN: [id number] DOB: July 23, 1935 Today's Date: 03/24/2012 Time: 4098-1191 PT Time Calculation (min): 25 min  PT Assessment / Plan / Recommendation Clinical Impression  Patient is a 77 yo female admitted with hyponatremia, weakness, and UTI.  Patient with history of CVA's with rt. weakness and decreased speech.  Will benefit from acute PT to maximize independence prior to discharge.  Recommend HHPT for home safety evaluation and continued PT.    PT Assessment  Patient needs continued PT services    Follow Up Recommendations  Home health PT;Supervision - Intermittent    Does the patient have the potential to tolerate intense rehabilitation      Barriers to Discharge Decreased caregiver support      Equipment Recommendations  Other (comment) (3-in-1 BSC)    Recommendations for Other Services     Frequency Min 4X/week    Precautions / Restrictions Precautions Precautions: Fall Restrictions Weight Bearing Restrictions: No   Pertinent Vitals/Pain       Mobility  Bed Mobility Bed Mobility: Supine to Sit;Sit to Supine Supine to Sit: 4: Min guard;With rails;HOB flat Sit to Supine: 4: Min assist;With rail;HOB flat Details for Bed Mobility Assistance: Assist to raise LE's onto bed.  Cues for technique. Transfers Transfers: Sit to Stand;Stand to Sit Sit to Stand: 4: Min assist;With upper extremity assist;From bed Stand to Sit: 4: Min guard;With upper extremity assist;To bed Details for Transfer Assistance: Verbal cues for hand placement and technique.  Patient with posterior lean initially - cues to bring body forward over feet. Ambulation/Gait Ambulation/Gait Assistance: 4: Min guard Ambulation Distance (Feet): 24 Feet Assistive device: Rolling walker Ambulation/Gait Assistance Details: Verbal cues for use of RW during turns - take smaller steps and move RW in smaller increments.  Verbal cues to step with feet  apart and to stand upright. Gait Pattern: Step-through pattern;Decreased step length - right;Decreased step length - left;Shuffle;Trunk flexed;Narrow base of support Gait velocity: Slow gait speed    Exercises     PT Diagnosis: Difficulty walking;Abnormality of gait;Generalized weakness  PT Problem List: Decreased strength;Decreased activity tolerance;Decreased balance;Decreased mobility;Decreased knowledge of use of DME PT Treatment Interventions: DME instruction;Gait training;Stair training;Functional mobility training;Balance training;Patient/family education   PT Goals Acute Rehab PT Goals PT Goal Formulation: With patient Time For Goal Achievement: 03/31/12 Potential to Achieve Goals: Good Pt will go Supine/Side to Sit: Independently;with HOB 0 degrees PT Goal: Supine/Side to Sit - Progress: Goal set today Pt will go Sit to Supine/Side: Independently;with HOB 0 degrees PT Goal: Sit to Supine/Side - Progress: Goal set today Pt will go Sit to Stand: with modified independence;with upper extremity assist PT Goal: Sit to Stand - Progress: Goal set today Pt will Ambulate: 51 - 150 feet;with modified independence;with rolling walker PT Goal: Ambulate - Progress: Goal set today Pt will Go Up / Down Stairs: 3-5 stairs;with supervision;with rail(s);with least restrictive assistive device PT Goal: Up/Down Stairs - Progress: Goal set today  Visit Information  Last PT Received On: 03/24/12 Assistance Needed: +1    Subjective Data  Subjective: I want to get up and walk. Patient Stated Goal: To go home   Prior Functioning  Home Living Lives With: Daughter Available Help at Discharge: Family;Available PRN/intermittently (Daughter works) Type of Home: Apartment Home Access: Level entry Home Layout: Two level;Bed/bath upstairs Alternate Level Stairs-Number of Steps: 5-6 Alternate Level Stairs-Rails: Right Bathroom Shower/Tub: Engineer, manufacturing systems: Standard Bathroom  Accessibility: Yes How Accessible: Accessible via walker Home  Adaptive Equipment: Walker - rolling;Straight cane Prior Function Level of Independence: Independent with assistive device(s);Needs assistance Needs Assistance: Meal Prep;Light Housekeeping;Bathing Bath: Moderate Meal Prep: Maximal Light Housekeeping: Total Able to Take Stairs?: Yes Driving: No Vocation: Retired Musician: Expressive difficulties (From prior CVA - difficult to understand at times) Dominant Hand: Right    Cognition  Cognition Overall Cognitive Status: Appears within functional limits for tasks assessed/performed Arousal/Alertness: Awake/alert Orientation Level: Appears intact for tasks assessed Behavior During Session: [facility] for tasks performed    Extremity/Trunk Assessment Right Upper Extremity Assessment RUE ROM/Strength/Tone: Deficits RUE ROM/Strength/Tone Deficits: Strength 4/5 to 3/5 hand RUE Sensation: WFL - Light Touch RUE Coordination: Deficits RUE Coordination Deficits: Decreased fine motor movements Left Upper Extremity Assessment LUE ROM/Strength/Tone: WFL for tasks assessed LUE Sensation: WFL - Light Touch Right Lower Extremity Assessment RLE ROM/Strength/Tone: Deficits RLE ROM/Strength/Tone Deficits: Strength 4-/5 RLE Sensation: WFL - Light Touch RLE Coordination: Deficits RLE Coordination Deficits: Decreased heel-to-shin Left Lower Extremity Assessment LLE ROM/Strength/Tone: WFL for tasks assessed LLE Sensation: WFL - Light Touch LLE Coordination: WFL - gross motor   Balance Balance Balance Assessed: Yes Static Sitting Balance Static Sitting - Balance Support: No upper extremity supported;Feet supported Static Sitting - Level of Assistance: 5: Stand by assistance Static Sitting - Comment/# of Minutes: 6 Static Standing Balance Static Standing - Balance Support: Bilateral upper extremity supported Static Standing - Level of Assistance: 4: Min assist Static  Standing - Comment/# of Minutes: 3 minutes - Patient with posterior lean.  Verbal cues to bring trunk forward over feet.  End of Session PT - End of Session Equipment Utilized During Treatment: Gait belt Activity Tolerance: Patient limited by fatigue Patient left: in bed;with call Timmy Cleverly/phone within reach Nurse Communication: Mobility status  GP Functional Assessment Tool Used: Clinical judgement Functional Limitation: Mobility: Walking and moving around Mobility: Walking and Moving Around Current Status ([id number]): At least 20 percent but less than 40 percent impaired, limited or restricted Mobility: Walking and Moving Around Goal Status [phone]): At least 1 percent but less than 20 percent impaired, limited or restricted   Vena Austria 03/24/2012, 3:33 PM  Durenda Hurt. Renaldo Fiddler, [facility] Pager [phone]

## 2012-03-24 NOTE — ED Provider Notes (Signed)
I saw and evaluated the patient, reviewed the resident's note and I agree with the findings and plan. Patient with generalized weakness and nonfocal neuro exam. Has some hypokalemia. Patient will be admitted to internal medicine.  Date: 03/24/2012  Rate: 77  Rhythm: normal sinus rhythm  QRS Axis: normal  Intervals: normal  ST/T Wave abnormalities: normal  Conduction Disutrbances:none  Narrative Interpretation: LVH with early R/S  Old EKG Reviewed: unchanged    Eraina Winnie R. Rubin Payor, MD 03/24/12 0010

## 2012-03-24 NOTE — H&P (Signed)
Internal Medicine Teaching Service Attending Note Date: 03/24/2012  Patient name: [name]  Medical record number: [id number]  Date of birth: [birth year]   CC: Weakness  I have seen and evaluated [name] and discussed their care with the Residency Team.    [name] is a 77yo woamn with PMH of stroke, CKD and dementia who was brought to the ED at the request of her caregiver for weakness.  Her caregiver was not present when I saw her.  [name] reports being weak for the past few days and not feeling well.  She does report previous decreased appetite and some pain in her abdomen.  She reports today that she is feeling much better and was able to eat good meals for the past 2 meals.  She has had decreased PO intake including water and increased urinary frequency.  She reports wanting to be here in the hospital until she is "better."    She was found to have low Na, K and Cl in her blood and a UA concerning for UTI.   Further PMH, PSH, meds, allergies as per resident note.   Physical Exam: Blood pressure 135/58, pulse 77, temperature 98.4 F (36.9 C), temperature source Oral, resp. rate 18, height 5\' 6"  (1.676 m), weight 113 lb 8.6 oz (51.5 kg), SpO2 100.00%. General appearance: alert, cooperative and some dysarthria Head: Normocephalic, without obvious abnormality, atraumatic Eyes: EOMI, anicteric sclerae Lungs: clear to auscultation bilaterally and no wheezing Heart: NR, RR, no murmur noted Abdomen: + tenderness to palpation in the suprapubic/periumbilical area, +BS, no distention Extremities: mild edema to bilateral LE, no cyanosis Neurologic: + weakness to grip strength on the right, 4/5.  Also with some decreased UE strength 4/5 on the right, otherwise normal strength.  Gait not tested.   Lab results: Results for orders placed during the hospital encounter of 03/23/12 (from the past 24 hour(s))  CBC WITH DIFFERENTIAL     Status: Abnormal   Collection Time   03/23/12  4:14 PM      Result Value Range   WBC 4.5  4.0 - 10.5 K/uL   RBC 4.21  3.87 - 5.11 MIL/uL   Hemoglobin 11.7 (*) 12.0 - 15.0 g/dL   HCT 40.9 (*) 81.1 - 91.4 %   MCV 78.1  78.0 - 100.0 fL   MCH 27.8  26.0 - 34.0 pg   MCHC 35.6  30.0 - 36.0 g/dL   RDW 78.2  95.6 - 21.3 %   Platelets 167  150 - 400 K/uL   Neutrophils Relative 69  43 - 77 %   Neutro Abs 3.1  1.7 - 7.7 K/uL   Lymphocytes Relative 21  12 - 46 %   Lymphs Abs 1.0  0.7 - 4.0 K/uL   Monocytes Relative 9  3 - 12 %   Monocytes Absolute 0.4  0.1 - 1.0 K/uL   Eosinophils Relative 0  0 - 5 %   Eosinophils Absolute 0.0  0.0 - 0.7 K/uL   Basophils Relative 0  0 - 1 %   Basophils Absolute 0.0  0.0 - 0.1 K/uL  POCT I-STAT TROPONIN I     Status: None   Collection Time    03/23/12  4:36 PM      Result Value Range   Troponin i, poc 0.03  0.00 - 0.08 ng/mL   Comment 3           POCT I-STAT, CHEM 8     Status:  Abnormal   Collection Time    03/23/12  4:38 PM      Result Value Range   Sodium 125 (*) 135 - 145 mEq/L   Potassium 2.9 (*) 3.5 - 5.1 mEq/L   Chloride 86 (*) 96 - 112 mEq/L   BUN 12  6 - 23 mg/dL   Creatinine, Ser 9.60 (*) 0.50 - 1.10 mg/dL   Glucose, Bld 454 (*) 70 - 99 mg/dL   Calcium, Ion 0.98 (*) 1.13 - 1.30 mmol/L   TCO2 29  0 - 100 mmol/L   Hemoglobin 12.6  12.0 - 15.0 g/dL   HCT 11.9  14.7 - 82.9 %  URINALYSIS, ROUTINE W REFLEX MICROSCOPIC     Status: Abnormal   Collection Time    03/23/12  4:49 PM      Result Value Range   Color, Urine YELLOW  YELLOW   APPearance CLOUDY (*) CLEAR   Specific Gravity, Urine 1.009  1.005 - 1.030   pH 5.5  5.0 - 8.0   Glucose, UA NEGATIVE  NEGATIVE mg/dL   Hgb urine dipstick SMALL (*) NEGATIVE   Bilirubin Urine NEGATIVE  NEGATIVE   Ketones, ur NEGATIVE  NEGATIVE mg/dL   Protein, ur NEGATIVE  NEGATIVE mg/dL   Urobilinogen, UA 0.2  0.0 - 1.0 mg/dL   Nitrite NEGATIVE  NEGATIVE   Leukocytes, UA MODERATE (*) NEGATIVE  URINE MICROSCOPIC-ADD ON     Status: Abnormal    Collection Time    03/23/12  4:49 PM      Result Value Range   Squamous Epithelial / LPF FEW (*) RARE   WBC, UA 7-10  <3 WBC/hpf   RBC / HPF 0-2  <3 RBC/hpf   Casts HYALINE CASTS (*) NEGATIVE   Urine-Other RARE YEAST    MRSA PCR SCREENING     Status: None   Collection Time    03/23/12  9:54 PM      Result Value Range   MRSA by PCR NEGATIVE  NEGATIVE  CREATININE, URINE, RANDOM     Status: None   Collection Time    03/24/12  2:15 AM      Result Value Range   Creatinine, Urine 38.09    NA AND K (SODIUM & POTASSIUM), RAND UR     Status: None   Collection Time    03/24/12  2:15 AM      Result Value Range   Sodium, Ur 34     Potassium Urine Timed 7    OSMOLALITY, URINE     Status: Abnormal   Collection Time    03/24/12  2:15 AM      Result Value Range   Osmolality, Ur 157 (*) 390 - 1090 mOsm/kg  CBC WITH DIFFERENTIAL     Status: Abnormal   Collection Time    03/24/12  2:41 AM      Result Value Range   WBC 3.9 (*) 4.0 - 10.5 K/uL   RBC 3.92  3.87 - 5.11 MIL/uL   Hemoglobin 10.7 (*) 12.0 - 15.0 g/dL   HCT 56.2 (*) 13.0 - 86.5 %   MCV 77.8 (*) 78.0 - 100.0 fL   MCH 27.3  26.0 - 34.0 pg   MCHC 35.1  30.0 - 36.0 g/dL   RDW 78.4  69.6 - 29.5 %   Platelets 151  150 - 400 K/uL   Neutrophils Relative 55  43 - 77 %   Neutro Abs 2.1  1.7 - 7.7 K/uL   Lymphocytes  Relative 33  12 - 46 %   Lymphs Abs 1.3  0.7 - 4.0 K/uL   Monocytes Relative 11  3 - 12 %   Monocytes Absolute 0.4  0.1 - 1.0 K/uL   Eosinophils Relative 1  0 - 5 %   Eosinophils Absolute 0.0  0.0 - 0.7 K/uL   Basophils Relative 1  0 - 1 %   Basophils Absolute 0.0  0.0 - 0.1 K/uL  COMPREHENSIVE METABOLIC PANEL     Status: Abnormal   Collection Time    03/24/12  2:41 AM      Result Value Range   Sodium 124 (*) 135 - 145 mEq/L   Potassium 3.1 (*) 3.5 - 5.1 mEq/L   Chloride 88 (*) 96 - 112 mEq/L   CO2 28  19 - 32 mEq/L   Glucose, Bld 99  70 - 99 mg/dL   BUN 14  6 - 23 mg/dL   Creatinine, Ser 9.60 (*) 0.50 - 1.10  mg/dL   Calcium 8.7  8.4 - 45.4 mg/dL   Total Protein 5.6 (*) 6.0 - 8.3 g/dL   Albumin 2.8 (*) 3.5 - 5.2 g/dL   AST 19  0 - 37 U/L   ALT 7  0 - 35 U/L   Alkaline Phosphatase 45  39 - 117 U/L   Total Bilirubin 0.2 (*) 0.3 - 1.2 mg/dL   GFR calc non Af Amer 35 (*) >90 mL/min   GFR calc Af Amer 40 (*) >90 mL/min  MAGNESIUM     Status: None   Collection Time    03/24/12  2:41 AM      Result Value Range   Magnesium 1.9  1.5 - 2.5 mg/dL  BASIC METABOLIC PANEL     Status: Abnormal   Collection Time    03/24/12  9:54 AM      Result Value Range   Sodium 131 (*) 135 - 145 mEq/L   Potassium 3.4 (*) 3.5 - 5.1 mEq/L   Chloride 94 (*) 96 - 112 mEq/L   CO2 30  19 - 32 mEq/L   Glucose, Bld 139 (*) 70 - 99 mg/dL   BUN 12  6 - 23 mg/dL   Creatinine, Ser 0.98 (*) 0.50 - 1.10 mg/dL   Calcium 9.0  8.4 - 11.9 mg/dL   GFR calc non Af Amer 33 (*) >90 mL/min   GFR calc Af Amer 38 (*) >90 mL/min    Imaging results:  Dg Chest 2 View  03/23/2012  *RADIOLOGY REPORT*  Clinical Data: CVA, cough.  CHEST - 2 VIEW  Comparison: 11/29/2010  Findings: Heart and mediastinal contours are within normal limits. No focal opacities or effusions.  No acute bony abnormality.  IMPRESSION: No active cardiopulmonary disease.   Original Report Authenticated By: Charlett Nose, M.D.    Ct Head Wo Contrast  03/23/2012  *RADIOLOGY REPORT*  Clinical Data: 77 year old female with stroke-like symptoms.  New onset headache.  Abnormal gait and blurred vision.  CT HEAD WITHOUT CONTRAST  Technique:  Contiguous axial images were obtained from the base of the skull through the vertex without contrast.  Comparison: Brain MRI 01/09/2012 and earlier.  Findings: Visualized paranasal sinuses and mastoids are clear.  No acute orbit or scalp soft tissue findings. No acute osseous abnormality identified.  Calcified atherosclerosis at the skull base.  Multi focal chronic cortical infarct.  Posterior left MCA and right PCA territory is worst affected.   Right MCA, left ACA, and left PCA territory  is also involved.  Stable cerebral volume.  Cavum septum pellucidum normal variant. Stable ventricle size and configuration.  No midline shift, mass effect, or evidence of mass lesion.  No acute intracranial hemorrhage identified.  No suspicious intracranial vascular hyperdensity. No evidence of cortically based acute infarction identified.  IMPRESSION: Numerous bilateral chronic cortical infarcts. No acute intracranial abnormality.   Original Report Authenticated By: Erskine Speed, M.D.     Assessment and Plan: I agree with the formulated Assessment and Plan with the following changes:   1. Hyponatremia - As patient appeared volume depleted on admission, this could be hypovolemic vs. SIADH - Further labs are pending to evaluate further  2. Hypokalemia - Replaced oral and IV as needed  3. UTI - UC is pending, she has increased frequency and suprapubic pain, so will treat for presumed UTI - IV rocephin  4. Deconditioning - PT/OT consult, she may benefit from SNF placement  Other issues as per resident note.    Inez Catalina, MD 3/2/201411:00 AM

## 2012-03-25 LAB — BASIC METABOLIC PANEL
BUN: 17 mg/dL (ref 6–23)
CO2: 24 mEq/L (ref 19–32)
Calcium: 8.3 mg/dL — ABNORMAL LOW (ref 8.4–10.5)
Creatinine, Ser: 1.55 mg/dL — ABNORMAL HIGH (ref 0.50–1.10)

## 2012-03-25 LAB — CBC
MCHC: 34.1 g/dL (ref 30.0–36.0)
MCV: 80.8 fL (ref 78.0–100.0)
Platelets: 151 10*3/uL (ref 150–400)
RDW: 13.9 % (ref 11.5–15.5)
WBC: 5 10*3/uL (ref 4.0–10.5)

## 2012-03-25 MED ORDER — CIPROFLOXACIN HCL 250 MG PO TABS: 250.0000 mg | ORAL_TABLET | Freq: Three times a day (TID) | ORAL | Status: DC

## 2012-03-25 MED ORDER — ADULT MULTIVITAMIN W/MINERALS CH
1.0000 | ORAL_TABLET | Freq: Every day | ORAL | Status: DC
  Administered 2012-03-25: 1 via ORAL
  Filled 2012-03-25: qty 1

## 2012-03-25 MED ORDER — ADULT MULTIVITAMIN W/MINERALS CH: 1.0000 | ORAL_TABLET | Freq: Every day | ORAL | Status: DC

## 2012-03-25 MED ORDER — ENSURE COMPLETE PO LIQD
237.0000 mL | ORAL | Status: DC
  Administered 2012-03-25: 237 mL via ORAL

## 2012-03-25 MED ORDER — ENSURE COMPLETE PO LIQD: 237.0000 mL | ORAL | Status: DC

## 2012-03-25 NOTE — Progress Notes (Signed)
Subjective: She continues to feel weakness in her legs and tenderness with left ankle eversion. She denies dysuria. Her daughter at her bedside. Per her daughter, the patient has been considered for PACE program in the past. Daughter states that she would like her mom to be in PACE program.   Objective: Vital signs in last 24 hours: Filed Vitals:   03/24/12 1822 03/24/12 2022 03/25/12 0457 03/25/12 0908  BP: 128/64 156/94 131/54 111/50  Pulse: 86 63 54 64  Temp: 98.7 F (37.1 C) 98 F (36.7 C) 98.2 F (36.8 C)   TempSrc: Oral Oral Oral   Resp: 18 18 18 18   Height:      Weight:  115 lb 15.4 oz (52.6 kg)    SpO2: 100% 100% 100% 100%   Weight change: 2 lb 6.8 oz (1.1 kg)  Intake/Output Summary (Last 24 hours) at 03/25/12 1307 Last data filed at 03/25/12 0907  Gross per 24 hour  Intake    890 ml  Output    477 ml  Net    413 ml   Vitals reviewed.  General: Sitting in bed, in NAD  HEENT: PERRL, no scleral icterus. MMM  Cardiac: RRR, no rubs, murmurs or gallops  Pulm: clear to auscultation bilaterally, no wheezes, rales, or rhonchi  Abd: soft, nontender, nondistended, BS present. Suprapubic tenderness  Ext: warm and well perfused, trace pretibial edema. Left foot is inverted. Left ankle with lateral malleolus wound with dry scab with no discharge or bleeding. Tenderness upon palpation of the entire lateral malleolus with minimum surrounding edema and darker discoloration. Right ankle with no edema, not TTP.  Neuro: Dysarthria, Alert and oriented X3, UE strength 4/5 on right with decreased grip strength. LE with 4/5 strength bilaterally. She moves all 4 extremities voluntarily.   Lab Results: Basic Metabolic Panel:  Recent Labs Lab 03/24/12 0241 03/24/12 0954 03/25/12 0514  NA 124* 131* 132*  K 3.1* 3.4* 4.3  CL 88* 94* 103  CO2 28 30 24   GLUCOSE 99 139* 85  BUN 14 12 17   CREATININE 1.45* 1.51* 1.55*  CALCIUM 8.7 9.0 8.3*  MG 1.9  --   --    Liver Function  Tests:  Recent Labs Lab 03/24/12 0241  AST 19  ALT 7  ALKPHOS 45  BILITOT 0.2*  PROT 5.6*  ALBUMIN 2.8*   CBC:  Recent Labs Lab 03/23/12 1614  03/24/12 0241 03/25/12 0514  WBC 4.5  --  3.9* 5.0  NEUTROABS 3.1  --  2.1  --   HGB 11.7*  < > 10.7* 10.6*  HCT 32.9*  < > 30.5* 31.1*  MCV 78.1  --  77.8* 80.8  PLT 167  --  151 151  < > = values in this interval not displayed.  Thyroid Function Tests:  Recent Labs Lab 03/24/12 0241  TSH 0.806   Urinalysis:  Recent Labs Lab 03/23/12 1649  COLORURINE YELLOW  LABSPEC 1.009  PHURINE 5.5  GLUCOSEU NEGATIVE  HGBUR SMALL*  BILIRUBINUR NEGATIVE  KETONESUR NEGATIVE  PROTEINUR NEGATIVE  UROBILINOGEN 0.2  NITRITE NEGATIVE  LEUKOCYTESUR MODERATE*    Micro Results: Recent Results (from the past 240 hour(s))  URINE CULTURE     Status: None   Collection Time    03/23/12  4:49 PM      Result Value Range Status   Specimen Description URINE, RANDOM   Final   Special Requests ADDED ON 03/23/12 AT 1807   Final   Culture  Setup Time 03/24/2012  01:29   Final   Colony Count 20,OOO COLONIES/ML   Final   Culture ESCHERICHIA COLI   Final   Report Status PENDING   Incomplete  MRSA PCR SCREENING     Status: None   Collection Time    03/23/12  9:54 PM      Result Value Range Status   MRSA by PCR NEGATIVE  NEGATIVE Final   Comment:            The GeneXpert MRSA Assay (FDA     approved for NASAL specimens     only), is one component of a     comprehensive MRSA colonization     surveillance program. It is not     intended to diagnose MRSA     infection nor to guide or     monitor treatment for     MRSA infections.   Studies/Results: Dg Chest 2 View  03/23/2012  *RADIOLOGY REPORT*  Clinical Data: CVA, cough.  CHEST - 2 VIEW  Comparison: 11/29/2010  Findings: Heart and mediastinal contours are within normal limits. No focal opacities or effusions.  No acute bony abnormality.  IMPRESSION: No active cardiopulmonary disease.    Original Report Authenticated By: Charlett Nose, M.D.    Ct Head Wo Contrast  03/23/2012  *RADIOLOGY REPORT*  Clinical Data: 77 year old female with stroke-like symptoms.  New onset headache.  Abnormal gait and blurred vision.  CT HEAD WITHOUT CONTRAST  Technique:  Contiguous axial images were obtained from the base of the skull through the vertex without contrast.  Comparison: Brain MRI 01/09/2012 and earlier.  Findings: Visualized paranasal sinuses and mastoids are clear.  No acute orbit or scalp soft tissue findings. No acute osseous abnormality identified.  Calcified atherosclerosis at the skull base.  Multi focal chronic cortical infarct.  Posterior left MCA and right PCA territory is worst affected.  Right MCA, left ACA, and left PCA territory is also involved.  Stable cerebral volume.  Cavum septum pellucidum normal variant. Stable ventricle size and configuration.  No midline shift, mass effect, or evidence of mass lesion.  No acute intracranial hemorrhage identified.  No suspicious intracranial vascular hyperdensity. No evidence of cortically based acute infarction identified.  IMPRESSION: Numerous bilateral chronic cortical infarcts. No acute intracranial abnormality.   Original Report Authenticated By: Erskine Speed, M.D.    Dg Foot Complete Left  03/24/2012  *RADIOLOGY REPORT*  Clinical Data: Fall, left foot pain, tissue breakdown at the lateral malleolus  LEFT FOOT - COMPLETE 3+ VIEW  Comparison: None.  Findings: No fracture or dislocation is seen.  The joint spaces are preserved.  Small plantar calcaneal enthesophyte.  Visualized soft tissues are grossly unremarkable.  No radiographic findings to suggest osteomyelitis.  IMPRESSION: No fracture or dislocation is seen.  No radiographic findings to suggest osteomyelitis.   Original Report Authenticated By: Charline Bills, M.D.    Medications: I have reviewed the patient's current medications. Scheduled Meds: . cefTRIAXone (ROCEPHIN)  IV  1 g  Intravenous Q24H  . clopidogrel  75 mg Oral Daily  . feeding supplement  237 mL Oral Q24H  . heparin  5,000 Units Subcutaneous Q8H  . metoprolol tartrate  25 mg Oral BID  . multivitamin with minerals  1 tablet Oral Daily   Continuous Infusions:  PRN Meds:.acetaminophen, acetaminophen, ondansetron (ZOFRAN) IV, ondansetron Assessment/Plan:  Hyponatremia. Improving. 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 Na on presentation at 125. She reports having large intake of water recently to compensate for her increased urinary frequency.  Her Na is 132 this morning. TSH normal/low at 0.806, urine osmolality low.  -fluid restriction, UTI treatment per below   Hypokalemia. K of 2.9 on presentation, likely 2/2 to reported decreased intake of foods due to recent decreased appetite. K of 4.3 today s/p repletion with PO K.  -BMET in AM   UTI. Pt with dysuria, increased urinary frequency, and suprapubic tenderness, UA shows moderate leukocytes, 7-10 WBC. Urine culture with E. Coli but only 20,000 colonies -Continue empiric coverage with Rocephin  -Will continue tx for UTI as outpatient, pt to f/u with her PCP  Recent Fall. She reports recent mechanic fall last month while going to the bathroom with left ankle injury. She reports this was related to increased urinary frequency with urgency to go to the bathroom and poor vision. She has had decreased ambulation since then. Left foot X-ray with no dislocation, fracture, effusion, or osteomyelitis -Per PT, HH PT   Weakness/Deconditioning.  She reports not walking as much since her mechanical fall about one month ago. She also has history of several strokes. She has a walker at home but reports not moving around as much recently.  -PT with recommendation for [facility] PT   HTN. BP well controlled overnight. Continue Lopressor 25mg  BID   Severe Malnutrition. Albumin of 2.8, BMI of 18.4. She reports decreased appetite.  -Nutrition consulted -Ensure feeding  supplementation -Care management consult for PACE Program enrollment  CKD stage 3. Cr on presentation 1.6, at baseline of 1.4-1.8. Cr 1.55 today -BMET in AM   History of Stroke. Residual right sided weakness.  -Continue Plavix 75mg  daily.  -HH PT  Dispo: Disposition is deferred at this time, awaiting improvement of current medical problems. Anticipated discharge in approximately 1-2 day(s).  The patient does have a current PCP (REESE,BETTI D, MD), therefore will not be requiring OPC follow-up after discharge.  The patient does not have transportation limitations that hinder transportation to clinic appointments.  .Services Needed at time of discharge: Y = Yes, Blank = No  PT:    OT:    RN:    Equipment:    Other:        LOS: 2 days   Sara Chu D 03/25/2012, 1:07 PM

## 2012-03-25 NOTE — Progress Notes (Addendum)
INITIAL NUTRITION ASSESSMENT  DOCUMENTATION CODES Per approved criteria  -Severe malnutrition in the context of chronic illness   INTERVENTION: 1. Intake has improved significantly since admission, will add Ensure Complete po daily, each supplement provides 350 kcal and 13 grams of protein, to promote weight gain. 2. Add MVI daily 3. RD to continue to follow nutrition care plan  NUTRITION DIAGNOSIS: Inadequate oral intake related to poor appetite as evidenced by weight loss and family report.   Goal: Intake to meet >90% of estimated nutrition needs.  Monitor:  weight trends, lab trends, I/O's, PO intake, supplement tolerance  Reason for Assessment: MD Consult for Nutrition Assessment  77 y.o. female  Admitting Dx: Hyponatremia  ASSESSMENT: Hx of stroke, CKD, dementia, and HTN. Admitted with weakness, poor oral intake, increased urinary frequency, abdominal pain, and dizziness. Noted that pt reported yesterday that she is feeling much better was able to at least 2 good meals while inpatient. Meal intake on flowsheets confirms that pt is eating well, consuming mostly 50-100% of meals.  Per chart review, pt with 15% wt loss x 3-4 months. This is significant. Pt meets criteria for severe MALNUTRITION in the context of chronic illness as evidenced by intake of <75% x at least 1 month and 15% wt loss x 3-4 months. Pt with moderate temporal wasting at temples.  Pt reporting much improved intake since admission.  Height: Ht Readings from Last 1 Encounters:  03/23/12 5\' 6"  (1.676 m)    Weight: Wt Readings from Last 1 Encounters:  03/24/12 115 lb 15.4 oz (52.6 kg)    Ideal Body Weight: 130 lb  % Ideal Body Weight: 88%  Wt Readings from Last 10 Encounters:  03/24/12 115 lb 15.4 oz (52.6 kg)  01/16/11 138 lb (62.596 kg)  11/29/10 121 lb 11.1 oz (55.2 kg)  09/07/08 134 lb 6.1 oz (60.955 kg)    Usual Body Weight: 134 - 138 lb  % Usual Body Weight: 85%; 15% wt loss x 3-4  months  BMI:  Body mass index is 18.73 kg/(m^2). Normal Weight  Estimated Nutritional Needs: Kcal: 1300 - 1550 Protein: 55 - 65 grams Fluid: at least 1.5 liters daily  Skin: elbow abrasion  Diet Order: General  EDUCATION NEEDS: -No education needs identified at this time   Intake/Output Summary (Last 24 hours) at 03/25/12 0848 Last data filed at 03/24/12 2300  Gross per 24 hour  Intake    770 ml  Output    477 ml  Net    293 ml    Last BM: 3/2  Labs:   Recent Labs Lab 03/24/12 0241 03/24/12 0954 03/25/12 0514  NA 124* 131* 132*  K 3.1* 3.4* 4.3  CL 88* 94* 103  CO2 28 30 24   BUN 14 12 17   CREATININE 1.45* 1.51* 1.55*  CALCIUM 8.7 9.0 8.3*  MG 1.9  --   --   GLUCOSE 99 139* 85    CBG (last 3)  No results found for this basename: GLUCAP,  in the last 72 hours  Scheduled Meds: . cefTRIAXone (ROCEPHIN)  IV  1 g Intravenous Q24H  . clopidogrel  75 mg Oral Daily  . heparin  5,000 Units Subcutaneous Q8H  . metoprolol tartrate  25 mg Oral BID    Continuous Infusions:   Past Medical History  Diagnosis Date  . Hypertension   . Stroke     tias, 2 strokes with right sided deficits and slurred speech  . Arthritis  hands  . Depression   . Asthma   . Hypochromic anemia   . Hyperlipidemia   . Hx of adenomatous colonic polyps   . Diverticulosis   . Chronic kidney disease (CKD), stage III (moderate)     Past Surgical History  Procedure Laterality Date  . Vascular surgery    . Abdominal hysterectomy    . Other surgical history      2 ear surgeries  . Tonsillectomy    . Abdominal surgery    . Colectomy    . Subtotal colectomy  2010    Jarold Motto MS, [place], [place] Pager: [phone] After-hours pager: [phone]

## 2012-03-25 NOTE — Progress Notes (Signed)
03/25/2012 3:34 PM  Went over DC instructions with pt caregiver.  Pt caregiver wishes to make followup appointment herself.  Went over medications, diet, activity, when to call the doctor, etc.  Pt caregiver verbalized understanding, and stated that she had no further questions pertaining to DC at this time.   Eunice Blase

## 2012-03-25 NOTE — Care Management Note (Signed)
   CARE MANAGEMENT NOTE 03/25/2012  Patient:  [name], [name]   Account Number:  [id number]  Date Initiated:  03/25/2012  Documentation initiated by:  Darlyne Russian  Subjective/Objective Assessment:   Patient admitted with low Na 125, K 2.9, possible UTI.  Patient lives at home with daughter.     Action/Plan:   Progression of care and discharge planning  03/25/12 Met with pt and spoke with pt daughter re [facility] needs. Daughter selected [facility] for HHPT and request a PACE referral.   Anticipated DC Date:  03/25/2012   Anticipated DC Plan:  HOME W HOME HEALTH SERVICES         Choice offered to / List presented to:          [facility] arranged  HH-2 PT      [facility] agency  [facility]   Status of service:  Completed, signed off Medicare Important Message given?   (If response is "NO", the following Medicare IM given date fields will be blank) Date Medicare IM given:   Date Additional Medicare IM given:    Discharge Disposition:  HOME W HOME HEALTH SERVICES  Per UR Regulation:    If discussed at Long Length of Stay Meetings, dates discussed:    Comments:  03/25/12 spoke with pt daughter who had requested info on PACE. She states that she had previously tried to contact PACE but do not receive a return call. This CM contacted PACE, and spoke with representative who states that she will call the daughter today. [facility] will provide HHPT for this pt at time of d/c. Johny Shock RN MPH Case Manager

## 2012-03-25 NOTE — Progress Notes (Signed)
Physical Therapy Treatment Patient Details Name: [name] MRN: [id number] DOB: 06-13-1935 Today's Date: 03/25/2012 Time: 0865-7846 PT Time Calculation (min): 25 min  PT Assessment / Plan / Recommendation Comments on Treatment Session  Pt making good progress toward her goals today. Pt's daughter arrived after returning to room from stair education. Verbal instruction/education provided to daughter on how to safely assist pt up/down stairs at home. Daughter verbalized understanding.    Follow Up Recommendations  Home health PT;Supervision - Intermittent           Equipment Recommendations  None recommended by PT       Frequency Min 4X/week   Plan Discharge plan remains appropriate;Frequency remains appropriate    Precautions / Restrictions Precautions Precautions: Fall       Mobility  Bed Mobility Bed Mobility: Sitting - Scoot to Edge of Bed Supine to Sit: 5: Supervision;HOB flat Sitting - Scoot to Edge of Bed: 5: Supervision Details for Bed Mobility Assistance: cues for technique with bed flat and no rails used. Incr time needed. Transfers Transfers: Stand Pivot Transfers Sit to Stand: 4: Min guard;From bed;With upper extremity assist;From chair/3-in-1;With armrests Stand to Sit: 5: Supervision;To chair/3-in-1;With upper extremity assist;With armrests Stand Pivot Transfers: 4: Min guard Details for Transfer Assistance: cues for hand placement with transfers. min assist with initial standing to center pt over her base of support due to a slight posterior lean. Ambulation/Gait Ambulation/Gait Assistance: 4: Min assist Ambulation Distance (Feet): 10 Feet Assistive device: 1 person hand held assist Ambulation/Gait Assistance Details: cues for posture and to incr BOS and incr step height for incr safety with gait. Limited gait distance due to no walker available at this time. Gait Pattern: Step-through pattern;Decreased stride length;Shuffle;Narrow base of  support Stairs: Yes Stairs Assistance: 4: Min assist Stairs Assistance Details (indicate cue type and reason): cues for technique and sequence with ascending and descending the stairs. use 1 rail and HHA with stairs as pt reports she only has one rail at home not two. Stair Management Technique: Forwards;One rail Left;Step to pattern Number of Stairs: 5     PT Goals Acute Rehab PT Goals PT Goal: Supine/Side to Sit - Progress: Progressing toward goal PT Goal: Sit to Stand - Progress: Progressing toward goal PT Goal: Ambulate - Progress: Progressing toward goal PT Goal: Up/Down Stairs - Progress: Progressing toward goal  Visit Information  Last PT Received On: 03/25/12 Assistance Needed: +1    Subjective Data  Subjective: No new complaints, agreeable to therapy at this time.   Cognition  Cognition Overall Cognitive Status: Appears within functional limits for tasks assessed/performed Arousal/Alertness: Awake/alert Orientation Level: Appears intact for tasks assessed Behavior During Session: [facility] for tasks performed       End of Session PT - End of Session Equipment Utilized During Treatment: Gait belt Activity Tolerance: Patient tolerated treatment well Patient left: in chair;with call bell/phone within reach;with family/visitor present Nurse Communication: Mobility status   GP     Sallyanne Kuster 03/25/2012, 3:28 PM  Sallyanne Kuster, PTA Office- [phone]

## 2012-03-25 NOTE — Discharge Summary (Addendum)
Internal Medicine Teaching [facility] Discharge Note  Name: [name] MRN: [id number] DOB: [birth year] 77 y.o.  Date of Admission: 03/23/2012  3:25 PM Date of Discharge: 03/27/12 Attending Physician: Dr. Eben Burow Discharge Diagnosis: Principal Problem:   Hyponatremia Active Problems:   Microcytic hypochromic anemia   HYPERTENSION   CKD (chronic kidney disease) stage 3, GFR 30-59 ml/min   Hypokalemia   Urinary tract infection   Physical deconditioning   Severe protein-calorie malnutrition   Discharge Medications:   Medication List    TAKE these medications       ciprofloxacin 250 MG tablet  Commonly known as:  CIPRO  Take 1 tablet (250 mg total) by mouth 3 (three) times daily.     clopidogrel 75 MG tablet  Commonly known as:  PLAVIX  Take 75 mg by mouth daily.     feeding supplement Liqd  Take 237 mLs by mouth daily.     metoprolol tartrate 25 MG tablet  Commonly known as:  LOPRESSOR  Take 25 mg by mouth 2 (two) times daily.     multivitamin with minerals Tabs  Take 1 tablet by mouth daily.        Disposition and follow-up:   [name] was discharged from [facility] in Stable condition.  At the hospital follow up visit please address: -Contact by [facility] -Initiation of Home Health Physical Therapy -Reassess UTI with symptoms of urinary frequency--she was discharged with prescription for Ciprofloxacin TID x 3 days.  -Reassess nutrition status and compliance with nutritional supplements (Ensure) -Repeat BMET for renal function monitoring (Creatinine 1.55 upon her discharge), Na trending (Na 132 upon her discharge), and K trending (K of 2.9 on her presentation but 4.3 on the day of her discharge)  Follow-up Appointments: Follow-up Information   Schedule an appointment as soon as possible for a visit with Emma Chimera, MD.   Contact information:   [address] [place] [address] [phone]     Dr. Leilani Able, Patient's daughter instructed to call and make an appointment.  Discharge Orders   Future Orders Complete By Expires     Diet - low sodium heart healthy  As directed     Increase activity slowly  As directed       Consultations: None  Procedures Performed:  Dg Chest 2 View  03/23/2012  *RADIOLOGY REPORT*  Clinical Data: CVA, cough.  CHEST - 2 VIEW  Comparison: 11/29/2010  Findings: Heart and mediastinal contours are within normal limits. No focal opacities or effusions.  No acute bony abnormality.  IMPRESSION: No active cardiopulmonary disease.   Original Report Authenticated By: Charlett Nose, M.D.    Ct Head Wo Contrast  03/23/2012  *RADIOLOGY REPORT*  Clinical Data: 77 year old female with stroke-like symptoms.  New onset headache.  Abnormal gait and blurred vision.  CT HEAD WITHOUT CONTRAST  Technique:  Contiguous axial images were obtained from the base of the skull through the vertex without contrast.  Comparison: Brain MRI 01/09/2012 and earlier.  Findings: Visualized paranasal sinuses and mastoids are clear.  No acute orbit or scalp soft tissue findings. No acute osseous abnormality identified.  Calcified atherosclerosis at the skull base.  Multi focal chronic cortical infarct.  Posterior left MCA and right PCA territory is worst affected.  Right MCA, left ACA, and left PCA territory is also involved.  Stable cerebral volume.  Cavum septum pellucidum normal variant. Stable ventricle size and configuration.  No midline shift, mass effect, or  evidence of mass lesion.  No acute intracranial hemorrhage identified.  No suspicious intracranial vascular hyperdensity. No evidence of cortically based acute infarction identified.  IMPRESSION: Numerous bilateral chronic cortical infarcts. No acute intracranial abnormality.   Original Report Authenticated By: Erskine Speed, M.D.    Dg Foot Complete Left  03/24/2012  *RADIOLOGY REPORT*  Clinical Data: Fall, left foot pain, tissue breakdown at the lateral  malleolus  LEFT FOOT - COMPLETE 3+ VIEW  Comparison: None.  Findings: No fracture or dislocation is seen.  The joint spaces are preserved.  Small plantar calcaneal enthesophyte.  Visualized soft tissues are grossly unremarkable.  No radiographic findings to suggest osteomyelitis.  IMPRESSION: No fracture or dislocation is seen.  No radiographic findings to suggest osteomyelitis.   Original Report Authenticated By: Charline Bills, M.D.     Admission HPI:  77 year old woman history of stroke, chronic kidney disease, dementia, and hypertension who was brought to the emergency department at the request of the patient's caretaker. The caretaker felt she had been weak for the past few days and wanted her evaluated. The patient reports "not feeling well" today. On review of systems, she reports a decreased appetite for months now, poor oral intake, increased urinary frequency, abdominal pain, and dizziness today. She denies chest pain, dyspnea, vomiting, diarrhea, falls, and loss of consciousness.  Hospital Course by problem list:  Hyponatremia. 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 Na on presentation at 125 with low urine osmolality. She reports having large intake of water recently to compensate for her increased urinary frequency. Her hyponatremia gradually improved with fluid restriction. Her is TSH normal/low at 0.806. He Na is 132 upon her discharge. Her daughter and herself were counseled to avoid increased intake of water. She will follow up with her PCP within one week for repeat BMET and Na trending.   Hypokalemia. K of 2.9 on presentation. This was thought to be likely secondary to reported decreased intake of foods due to recent decreased appetite. K of 4.3 on the day of her discharge after oral supplementation of potassium. She will not be given prescription for K supplementation upon her discharge. She will follow up with her PCP for repeat BMET.   UTI. She presented with complaint of  dysuria, increased urinary frequency, and suprapubic tenderness. Her urinalysis showed moderate leukocytes, 7-10 WBC. She was started on UTI treatment with Rocephin. Her urine culture grew E. Coli but only 20,000 colonies . We continued her UTI empiric coverage given her symptoms. She will be discharged with prescription for ciprofloxacin TID for three days. She will follow up with her PCP to assure resolution of her symptoms.   Recent Fall. She reported recent mechanic fall last month while going to the bathroom resulting in left ankle injury. She notes that this was related to increased urinary frequency with urgency to go to the bathroom combined with her poor vision. She has had decreased ambulation since then. Her left foot X-ray revealed no dislocation, fracture, effusion, or osteomyelitis. She was evaluated by physical therapy with recommendation for Home Health Physical Therapy which was ordered prior to her discharge.   Weakness/Deconditioning. She reported decreased ambulation since her mechanical fall about one month ago. She also has history of several strokes. She has a walker at home but reports not using it as much recently. Per PT evaluation, patient discharged home with order for Home Health Physical Therapy.   HTN. Her blood pressure was mostly well controlled during this hospitalization with her home Lopressor 25mg  BID.  Severe Malnutrition. Albumin of 2.8, BMI of 18.4. She reports decreased appetite for weeks to months. Etiology unclear, could be related to deconditioning, worsening renal function, and perhaps malignancy. Per Dietitian consult, the patient was started on Ensure feeding supplementation and was discharged with prescription for continues supplementation. Care management was consulted for PACE Program enrollment. PACE program information was briefly discussed with Angelique Blonder, the patient's daughter. A PACE representative will contact the patient and her family after her  discharge to home.   CKD stage 3. Her baseline creatine fluctuates between 1.4 to 1.8. Her creatinine on presentation was 1.6, and at 1.55 today upon her discharge. She may benefit from close monitoring of her renal function and possible near future referral to Nephrology if she progresses to CKD stage 3b.   History of Stroke. No new stroke reported or noted. She was residual right sided weakness. We continued her home dose of Plavix.    Discharge Vitals:  BP 136/91  Pulse 69  Temp(Src) 98.4 F (36.9 C) (Oral)  Resp 20  Ht 5\' 6"  (1.676 m)  Wt 115 lb 15.4 oz (52.6 kg)  BMI 18.73 kg/m2  SpO2 98%  Discharge Labs:  Basic Metabolic Panel:   Recent Labs  Lab  03/24/12 0241  03/24/12 0954  03/25/12 0514   NA  124*  131*  132*   K  3.1*  3.4*  4.3   CL  88*  94*  103   CO2  28  30  24    GLUCOSE  99  139*  85   BUN  14  12  17    CREATININE  1.45*  1.51*  1.55*   CALCIUM  8.7  9.0  8.3*   MG  1.9  --  --    Liver Function Tests:   Recent Labs  Lab  03/24/12 0241   AST  19   ALT  7   ALKPHOS  45   BILITOT  0.2*   PROT  5.6*   ALBUMIN  2.8*    CBC:   Recent Labs  Lab  03/23/12 1614   03/24/12 0241  03/25/12 0514   WBC  4.5  --  3.9*  5.0   NEUTROABS  3.1  --  2.1  --   HGB  11.7*  < >  10.7*  10.6*   HCT  32.9*  < >  30.5*  31.1*   MCV  78.1  --  77.8*  80.8   PLT  167  --  151  151   < > = values in this interval not displayed.  Thyroid Function Tests:   Recent Labs  Lab  03/24/12 0241   TSH  0.806    Urinalysis:   Recent Labs  Lab  03/23/12 1649   COLORURINE  YELLOW   LABSPEC  1.009   PHURINE  5.5   GLUCOSEU  NEGATIVE   HGBUR  SMALL*   BILIRUBINUR  NEGATIVE   KETONESUR  NEGATIVE   PROTEINUR  NEGATIVE   UROBILINOGEN  0.2   NITRITE  NEGATIVE   LEUKOCYTESUR  MODERATE*    Micro Results:  Recent Results (from the past 240 hour(s))   URINE CULTURE Status: None    Collection Time    03/23/12 4:49 PM   Result  Value  Range  Status    Specimen  Description  URINE, RANDOM   Final    Special Requests  ADDED ON 03/23/12 AT 1807   Final  Culture Setup Time  03/24/2012 01:29   Final    Colony Count  20,OOO COLONIES/ML   Final    Culture  ESCHERICHIA COLI   Final    Report Status  PENDING   Incomplete   MRSA PCR SCREENING Status: None    Collection Time    03/23/12 9:54 PM   Result  Value  Range  Status    MRSA by PCR  NEGATIVE  NEGATIVE  Final    Comment:      The GeneXpert MRSA Assay (FDA     approved for NASAL specimens     only), is one component of a     comprehensive MRSA colonization     surveillance program. It is not     intended to diagnose MRSA     infection nor to guide or     monitor treatment for     MRSA infections.      SignedLars Mage 03/29/2012, 8:14 PM   Time Spent on Discharge: 45 minutes Services Ordered on Discharge: HH PT Equipment Ordered on Discharge: 3 in 1 bedside commode

## 2012-03-25 NOTE — Progress Notes (Signed)
[facility]  Patient Status: New  [facility] is providing the following services: PT  If patient discharges after hours, please call [phone].   [name] 03/25/2012, 1:50 PM

## 2012-03-26 LAB — URINE CULTURE

## 2012-05-26 ENCOUNTER — Encounter (HOSPITAL_COMMUNITY): Payer: Self-pay | Admitting: Emergency Medicine

## 2012-05-26 ENCOUNTER — Inpatient Hospital Stay (HOSPITAL_COMMUNITY)
Admission: EM | Admit: 2012-05-26 | Discharge: 2012-05-29 | DRG: 348 | Disposition: A | Discharge status: Home / Self Care | Payer: Medicare (Managed Care) | Attending: Internal Medicine | Admitting: Internal Medicine

## 2012-05-26 ENCOUNTER — Inpatient Hospital Stay (HOSPITAL_COMMUNITY): Payer: Medicare (Managed Care)

## 2012-05-26 DIAGNOSIS — Z8673 Personal history of transient ischemic attack (TIA), and cerebral infarction without residual deficits: Secondary | ICD-10-CM | POA: Diagnosis present

## 2012-05-26 DIAGNOSIS — Z833 Family history of diabetes mellitus: Secondary | ICD-10-CM

## 2012-05-26 DIAGNOSIS — F3289 Other specified depressive episodes: Secondary | ICD-10-CM | POA: Diagnosis present

## 2012-05-26 DIAGNOSIS — K5732 Diverticulitis of large intestine without perforation or abscess without bleeding: Secondary | ICD-10-CM

## 2012-05-26 DIAGNOSIS — F0390 Unspecified dementia without behavioral disturbance: Secondary | ICD-10-CM | POA: Diagnosis present

## 2012-05-26 DIAGNOSIS — L97309 Non-pressure chronic ulcer of unspecified ankle with unspecified severity: Secondary | ICD-10-CM | POA: Diagnosis present

## 2012-05-26 DIAGNOSIS — Z87891 Personal history of nicotine dependence: Secondary | ICD-10-CM

## 2012-05-26 DIAGNOSIS — I69998 Other sequelae following unspecified cerebrovascular disease: Secondary | ICD-10-CM

## 2012-05-26 DIAGNOSIS — J45909 Unspecified asthma, uncomplicated: Secondary | ICD-10-CM | POA: Diagnosis present

## 2012-05-26 DIAGNOSIS — Z79899 Other long term (current) drug therapy: Secondary | ICD-10-CM

## 2012-05-26 DIAGNOSIS — D500 Iron deficiency anemia secondary to blood loss (chronic): Secondary | ICD-10-CM | POA: Diagnosis present

## 2012-05-26 DIAGNOSIS — L98499 Non-pressure chronic ulcer of skin of other sites with unspecified severity: Secondary | ICD-10-CM | POA: Diagnosis present

## 2012-05-26 DIAGNOSIS — E785 Hyperlipidemia, unspecified: Secondary | ICD-10-CM | POA: Diagnosis present

## 2012-05-26 DIAGNOSIS — Z831 Family history of other infectious and parasitic diseases: Secondary | ICD-10-CM

## 2012-05-26 DIAGNOSIS — Z8249 Family history of ischemic heart disease and other diseases of the circulatory system: Secondary | ICD-10-CM

## 2012-05-26 DIAGNOSIS — Z823 Family history of stroke: Secondary | ICD-10-CM

## 2012-05-26 DIAGNOSIS — I129 Hypertensive chronic kidney disease with stage 1 through stage 4 chronic kidney disease, or unspecified chronic kidney disease: Secondary | ICD-10-CM | POA: Diagnosis present

## 2012-05-26 DIAGNOSIS — F329 Major depressive disorder, single episode, unspecified: Secondary | ICD-10-CM | POA: Diagnosis present

## 2012-05-26 DIAGNOSIS — K648 Other hemorrhoids: Principal | ICD-10-CM | POA: Diagnosis present

## 2012-05-26 DIAGNOSIS — K922 Gastrointestinal hemorrhage, unspecified: Secondary | ICD-10-CM

## 2012-05-26 DIAGNOSIS — Z86010 Personal history of colonic polyps: Secondary | ICD-10-CM

## 2012-05-26 DIAGNOSIS — M19049 Primary osteoarthritis, unspecified hand: Secondary | ICD-10-CM | POA: Diagnosis present

## 2012-05-26 DIAGNOSIS — E46 Unspecified protein-calorie malnutrition: Secondary | ICD-10-CM | POA: Diagnosis present

## 2012-05-26 DIAGNOSIS — I10 Essential (primary) hypertension: Secondary | ICD-10-CM | POA: Diagnosis present

## 2012-05-26 DIAGNOSIS — N183 Chronic kidney disease, stage 3 unspecified: Secondary | ICD-10-CM | POA: Diagnosis present

## 2012-05-26 DIAGNOSIS — R29898 Other symptoms and signs involving the musculoskeletal system: Secondary | ICD-10-CM | POA: Diagnosis present

## 2012-05-26 DIAGNOSIS — I69920 Aphasia following unspecified cerebrovascular disease: Secondary | ICD-10-CM

## 2012-05-26 DIAGNOSIS — D638 Anemia in other chronic diseases classified elsewhere: Secondary | ICD-10-CM | POA: Diagnosis present

## 2012-05-26 DIAGNOSIS — N189 Chronic kidney disease, unspecified: Secondary | ICD-10-CM | POA: Diagnosis present

## 2012-05-26 DIAGNOSIS — Z886 Allergy status to analgesic agent status: Secondary | ICD-10-CM

## 2012-05-26 DIAGNOSIS — Z86711 Personal history of pulmonary embolism: Secondary | ICD-10-CM

## 2012-05-26 DIAGNOSIS — IMO0002 Reserved for concepts with insufficient information to code with codable children: Secondary | ICD-10-CM

## 2012-05-26 DIAGNOSIS — D509 Iron deficiency anemia, unspecified: Secondary | ICD-10-CM

## 2012-05-26 LAB — COMPREHENSIVE METABOLIC PANEL
AST: 20 U/L (ref 0–37)
BUN: 33 mg/dL — ABNORMAL HIGH (ref 6–23)
CO2: 26 mEq/L (ref 19–32)
Calcium: 8.7 mg/dL (ref 8.4–10.5)
Chloride: 104 mEq/L (ref 96–112)
Creatinine, Ser: 1.67 mg/dL — ABNORMAL HIGH (ref 0.50–1.10)
GFR calc Af Amer: 33 mL/min — ABNORMAL LOW (ref >90)
GFR calc non Af Amer: 29 mL/min — ABNORMAL LOW (ref >90)
Glucose, Bld: 151 mg/dL — ABNORMAL HIGH (ref 70–99)
Total Bilirubin: 0.2 mg/dL — ABNORMAL LOW (ref 0.3–1.2)

## 2012-05-26 LAB — CBC WITH DIFFERENTIAL/PLATELET
Basophils Absolute: 0 K/uL (ref 0.0–0.1)
Basophils Relative: 0 % (ref 0–1)
Eosinophils Absolute: 0 K/uL (ref 0.0–0.7)
Eosinophils Relative: 1 % (ref 0–5)
HCT: 33.4 % — ABNORMAL LOW (ref 36.0–46.0)
Hemoglobin: 11.2 g/dL — ABNORMAL LOW (ref 12.0–15.0)
Lymphocytes Relative: 36 % (ref 12–46)
Lymphs Abs: 1.7 K/uL (ref 0.7–4.0)
MCH: 28 pg (ref 26.0–34.0)
MCHC: 33.5 g/dL (ref 30.0–36.0)
MCV: 83.5 fL (ref 78.0–100.0)
Monocytes Absolute: 0.3 K/uL (ref 0.1–1.0)
Monocytes Relative: 6 % (ref 3–12)
Neutro Abs: 2.7 K/uL (ref 1.7–7.7)
Neutrophils Relative %: 57 % (ref 43–77)
Platelets: 165 K/uL (ref 150–400)
RBC: 4 MIL/uL (ref 3.87–5.11)
RDW: 14.3 % (ref 11.5–15.5)
WBC: 4.8 K/uL (ref 4.0–10.5)

## 2012-05-26 LAB — CBC
HCT: 33.7 % — ABNORMAL LOW (ref 36.0–46.0)
HCT: 30 % — ABNORMAL LOW (ref 36.0–46.0)
Hemoglobin: 11 g/dL — ABNORMAL LOW (ref 12.0–15.0)
Hemoglobin: 10 g/dL — ABNORMAL LOW (ref 12.0–15.0)
MCH: 27.6 pg (ref 26.0–34.0)
MCHC: 32.6 g/dL (ref 30.0–36.0)
MCV: 84.5 fL (ref 78.0–100.0)
Platelets: 165 K/uL (ref 150–400)
RBC: 3.99 MIL/uL (ref 3.87–5.11)
RDW: 14.4 % (ref 11.5–15.5)
RDW: 14.4 % (ref 11.5–15.5)
WBC: 4.9 K/uL (ref 4.0–10.5)
WBC: 5.4 10*3/uL (ref 4.0–10.5)

## 2012-05-26 LAB — OCCULT BLOOD, POC DEVICE: Fecal Occult Bld: POSITIVE — AB

## 2012-05-26 LAB — PROTIME-INR
INR: 1 (ref 0.00–1.49)
Prothrombin Time: 13.1 s (ref 11.6–15.2)

## 2012-05-26 LAB — PHOSPHORUS: Phosphorus: 2.6 mg/dL (ref 2.3–4.6)

## 2012-05-26 LAB — TYPE AND SCREEN: Antibody Screen: NEGATIVE

## 2012-05-26 MED ORDER — SODIUM CHLORIDE 0.9 % IV BOLUS (SEPSIS)
1000.0000 mL | Freq: Once | INTRAVENOUS | Status: AC
  Administered 2012-05-26: 1000 mL via INTRAVENOUS

## 2012-05-26 MED ORDER — ACETAMINOPHEN 325 MG PO TABS: 650.0000 mg | ORAL_TABLET | Freq: Four times a day (QID) | ORAL | Status: DC | PRN

## 2012-05-26 MED ORDER — SODIUM CHLORIDE 0.9 % IJ SOLN: 3.0000 mL | Freq: Two times a day (BID) | INTRAMUSCULAR | Status: DC

## 2012-05-26 MED ORDER — SODIUM CHLORIDE 0.9 % IV SOLN
INTRAVENOUS | Status: DC
  Administered 2012-05-26 – 2012-05-27 (×2): via INTRAVENOUS

## 2012-05-26 MED ORDER — ONDANSETRON HCL 4 MG PO TABS: 4.0000 mg | ORAL_TABLET | Freq: Four times a day (QID) | ORAL | Status: DC | PRN

## 2012-05-26 MED ORDER — SODIUM CHLORIDE 0.9 % IJ SOLN
3.0000 mL | INTRAMUSCULAR | Status: DC | PRN
  Administered 2012-05-29: 3 mL via INTRAVENOUS

## 2012-05-26 MED ORDER — BIOTENE DRY MOUTH MT LIQD
15.0000 mL | Freq: Two times a day (BID) | OROMUCOSAL | Status: DC
  Administered 2012-05-26 – 2012-05-29 (×6): 15 mL via OROMUCOSAL

## 2012-05-26 MED ORDER — SODIUM CHLORIDE 0.9 % IV SOLN: 250.0000 mL | INTRAVENOUS | Status: DC | PRN

## 2012-05-26 MED ORDER — ALBUTEROL SULFATE HFA 108 (90 BASE) MCG/ACT IN AERS: 2.0000 | INHALATION_SPRAY | RESPIRATORY_TRACT | Status: DC | PRN

## 2012-05-26 MED ORDER — PANTOPRAZOLE SODIUM 40 MG IV SOLR
40.0000 mg | Freq: Every day | INTRAVENOUS | Status: DC
  Administered 2012-05-26: 40 mg via INTRAVENOUS
  Filled 2012-05-26 (×3): qty 40

## 2012-05-26 MED ORDER — ACETAMINOPHEN 650 MG RE SUPP: 650.0000 mg | Freq: Four times a day (QID) | RECTAL | Status: DC | PRN

## 2012-05-26 MED ORDER — SODIUM CHLORIDE 0.9 % IJ SOLN
3.0000 mL | Freq: Two times a day (BID) | INTRAMUSCULAR | Status: DC
  Administered 2012-05-26 – 2012-05-28 (×4): 3 mL via INTRAVENOUS

## 2012-05-26 MED ORDER — GUAIFENESIN-DM 100-10 MG/5ML PO SYRP
5.0000 mL | ORAL_SOLUTION | ORAL | Status: DC | PRN
  Filled 2012-05-26 (×2): qty 5

## 2012-05-26 MED ORDER — ONDANSETRON HCL 4 MG/2ML IJ SOLN: 4.0000 mg | Freq: Four times a day (QID) | INTRAMUSCULAR | Status: DC | PRN

## 2012-05-26 NOTE — ED Notes (Signed)
[name] from [place] program called about pt. Left her number to be contacted for any med changes, admission or questions [phone]. [name] also stated pt does not have cardiac hx but does take plavix due to hx of stroke.

## 2012-05-26 NOTE — ED Provider Notes (Signed)
History     CSN: [id number]  Arrival date & time 05/26/12  1529   First MD Initiated Contact with Patient 05/26/12 1531      Chief Complaint  Patient presents with  . GI Bleeding    (Consider location/radiation/quality/duration/timing/severity/associated sxs/prior treatment) The history is provided by the patient.  [name] is a 77 y.o. female history of hypertension, dementia, diverticulosis, stroke here presenting with bloody bowel movement. She noted a bloody bowel movement around 11:30 this morning. She had another bloody bowel movement around 2 PM today. Denies any abdominal pain denies any vomiting. She does have a history of diverticulosis but no gastric ulcers. She is on Plavix but doesn't remember if she is on Coumadin or not.   Level V caveat- dementia    Past Medical History  Diagnosis Date  . Hypertension   . Stroke     tias, 2 strokes with right sided deficits and slurred speech  . Arthritis     hands  . Depression   . Asthma   . Hypochromic anemia   . Hyperlipidemia   . Hx of adenomatous colonic polyps   . Diverticulosis   . Chronic kidney disease (CKD), stage III (moderate)     Past Surgical History  Procedure Laterality Date  . Vascular surgery    . Abdominal hysterectomy    . Other surgical history      2 ear surgeries  . Tonsillectomy    . Abdominal surgery    . Colectomy    . Subtotal colectomy  2010    Family History  Problem Relation Age of Onset  . Diabetes Paternal Aunt   . Colon cancer Neg Hx   . Stroke Father   . HIV Brother   . Heart disease Father   . Diabetes Father   . Prostate cancer    . Depression Father   . Cancer Sister     unknown type    History  Substance Use Topics  . Smoking status: Never Smoker   . Smokeless tobacco: Never Used  . Alcohol Use: No    OB History   Grav Para Term Preterm Abortions TAB SAB Ect Mult Living                  Review of Systems  Gastrointestinal: Positive for blood in  stool.  All other systems reviewed and are negative.    Allergies  Aspirin  Home Medications   Current Outpatient Rx  Name  Route  Sig  Dispense  Refill  . clopidogrel (PLAVIX) 75 MG tablet   Oral   Take 75 mg by mouth daily.           . feeding supplement (ENSURE COMPLETE) LIQD   Oral   Take 237 mLs by mouth 2 (two) times daily between meals.         . metoprolol tartrate (LOPRESSOR) 25 MG tablet   Oral   Take 25 mg by mouth 2 (two) times daily.             BP 131/64  Pulse 78  Temp(Src) 98.5 F (36.9 C) (Oral)  Resp 20  SpO2 100%  Physical Exam  Nursing note and vitals reviewed. Constitutional: She appears well-developed and well-nourished.  HENT:  Head: Normocephalic.  Mouth/Throat: Oropharynx is clear and moist.  Eyes: Conjunctivae are normal. Pupils are equal, round, and reactive to light.  Neck: Normal range of motion. Neck supple.  Cardiovascular: Normal rate, regular rhythm and  normal heart sounds.   Pulmonary/Chest: Effort normal and breath sounds normal. No respiratory distress. She has no wheezes. She has no rales.  Abdominal: Soft. Bowel sounds are normal. She exhibits no distension. There is no tenderness. There is no rebound and no guarding.  Rectal- small external hemorrhoid but no active bleeding. Minimal blood on the exam glove.   Musculoskeletal: Normal range of motion.  Neurological: She is alert.  Demented, nl strength throughout   Skin: Skin is warm and dry.  Psychiatric: She has a normal mood and affect. Her behavior is normal. Judgment and thought content normal.    ED Course  Procedures (including critical care time)  Labs Reviewed  CBC WITH DIFFERENTIAL - Abnormal; Notable for the following:    Hemoglobin 11.2 (*)    HCT 33.4 (*)    All other components within normal limits  COMPREHENSIVE METABOLIC PANEL - Abnormal; Notable for the following:    Glucose, Bld 151 (*)    BUN 33 (*)    Creatinine, Ser 1.67 (*)    Albumin 3.2  (*)    Total Bilirubin 0.2 (*)    GFR calc non Af Amer 29 (*)    GFR calc Af Amer 33 (*)    All other components within normal limits  OCCULT BLOOD, POC DEVICE - Abnormal; Notable for the following:    Fecal Occult Bld POSITIVE (*)    All other components within normal limits  PROTIME-INR   No results found.   No diagnosis found.    MDM  Emma COCUZZA is a 77 y.o. female here with GI bleed. Likely from diverticulosis but she does have an external hemorrhoid that is not actively bleeding or thrombosed. Will get CBC. Will likely need admission for monitoring.   4pm  Patient not hypotensive. Occ positive. H/h stable. BUN elevated suggesting GI bleed. I called [facility] who will see patient. i called unassigned and Dr. Arthor Captain, hospitalist, called back and mentioned that patient was at [facility] clinic so should go to internal medicine.   5:10 PM I called internal medicine teaching service, who accepted the patietn.         Richardean Canal, MD 05/26/12 [phone]

## 2012-05-26 NOTE — ED Notes (Signed)
Pt used bedside commode, had a gross amt of bright red blood.

## 2012-05-26 NOTE — ED Notes (Addendum)
Pt states she had a bowel movement around 1130am and noticed a lot of bright red blood. Pt states she then had another bowel movement around 2pm and noticed more bleeding and decided to come to the hospital. Pt takes plavix. Pt also denies any pain. Vitals signs stable b/p 138/62, 64 irregular, 18 resp, CBG 159.

## 2012-05-26 NOTE — H&P (Signed)
Hospital Admission Note Date: 05/26/2012  Patient name: [name] Medical record number: [id number] Date of birth: January 13, 1936 Age: 77 y.o. Gender: female PCP: [name], MD   Service:  Internal Medicine Teaching Service   Attending Physician:  Dr. Lars Mage    Chief Complaint:  Bloody bowel movements    History of Present Illness:  77 year old woman with a history of CVA, diverticulosis, stage III CKD, adenomatous colonic polyps, and a subtotal colectomy in 2010; presenting to the emergency department after a bloody bowel movement. She had a bowel movement at 1100 this morning with gross blood in the colon bowl and blood in the stool. She had a second bloody bowel movement here in the emergency department. She is otherwise without complaints. She denies dizziness, chest pain, dyspnea, abdominal pain, nausea, vomiting, and bleeding from other sites.   This patient has a long history of diverticulosis and diverticular hemorrhaging. She was offered a colectomy in 2005 but she refused. In 2010 she developed a diverticular abscess and underwent a total colectomy with anastomosis of the ileum and rectum. Since that time, she has had no further major bleeding although she does have slight bleeding from hemorrhoids from time to time.     Review of Systems:   Constitutional: Negative for fever, chills and malaise/fatigue.  HENT: Positive for congestion and sore throat (scratchy throat).   Eyes: Positive for blurred vision (chronic).  Respiratory: Positive for cough. Negative for sputum production and shortness of breath.   Cardiovascular: Negative for chest pain.  Gastrointestinal: Positive for blood in stool. Negative for nausea, vomiting and abdominal pain.  Genitourinary: Negative for hematuria.  Skin: Negative for rash.  Neurological: Negative for dizziness.  Endo/Heme/Allergies: Does not bruise/bleed easily.     Medical History: Past Medical History  Diagnosis Date  .  Hypertension   . Stroke May 2009    multiple, last one Nov 2012, 2 strokes with right sided deficits and slurred speech  . Arthritis     hands  . Depression   . Asthma   . Hypochromic anemia   . Hyperlipidemia   . Hx of adenomatous colonic polyps   . Diverticulosis   . Chronic kidney disease (CKD), stage III (moderate)     Surgical History: Past Surgical History  Procedure Laterality Date  . Vascular surgery    . Abdominal hysterectomy    . Other surgical history      2 ear surgeries  . Tonsillectomy    . Abdominal surgery    . Total colectomy  2010    Home Medications: Current Outpatient Rx  Name  Route  Sig  Dispense  Refill  . clopidogrel (PLAVIX) 75 MG tablet   Oral   Take 75 mg by mouth daily.           . feeding supplement (ENSURE COMPLETE) LIQD   Oral   Take 237 mLs by mouth 2 (two) times daily between meals.         . metoprolol tartrate (LOPRESSOR) 25 MG tablet   Oral   Take 25 mg by mouth 2 (two) times daily.             Allergies: Allergies as of 05/26/2012 - Review Complete 05/26/2012  Allergen Reaction Noted  . Aspirin Nausea And Vomiting     Family History: Family History  Problem Relation Age of Onset  . Diabetes Paternal Aunt   . Colon cancer Neg Hx   . Stroke Father   . HIV  Brother   . Heart disease Father   . Diabetes Father   . Prostate cancer    . Depression Father   . Cancer Sister     unknown type    Social History: Social History  . Marital Status: Single   Social History Main Topics  . Smoking status: Former  . Smokeless tobacco: Never Used  . Alcohol Use: No  . Drug Use: No  . Sexually Active: No    Social History Narrative  . [facility]     Physical exam: VITALS:  BP 163/83, HR 66, RR 16, temp 98.44F, SpO2 99% on room air GENERAL: well developed, well nourished; no acute distress HEAD: atraumatic, normocephalic EYES: pupils equal, round and reactive; sclera anicteric; normal conjunctiva EARS: canals  patent and right TM scarred, left TM normal NOSE/THROAT: oropharynx clear, moist mucous membranes, pink gums, normal dentition NECK: supple, thyroid normal in size and without palpable nodules LYMPH: no cervical or supraclavicular lymphadenopathy LUNGS: clear to auscultation bilaterally, normal work of breathing HEART: normal rate and regular rhythm; normal S1 and S2 without S3 or S4; no murmurs, rubs, or clicks PULSES: radial 2+ and symmetric, pedal pulses diminished ABDOMEN: soft; tenderness with palpation of the suprapubic area; no masses organomegaly; normal bowel sounds; no rigidity, guarding, or rebound tenderness SKIN: warm, dry, intact, normal turgor, no rashes EXTREMITIES: 1+ pitting edema in the ankles, no clubbing or cyanosis PSYCH: patient is alert and oriented, mood and affect are normal and congruent, thought content is normal without delusions, thought process is linear, speech is slurred with stuttering behavior is normal      Lab results: Basic Metabolic Panel:  11/91/47 1535  NA 138  K 4.0  CL 104  CO2 26  GLUCOSE 151*  BUN 33*  CREATININE 1.67*  CALCIUM 8.7    Liver Function Tests:  05/26/12 1535  AST 20  ALT 11  ALKPHOS 53  BILITOT 0.2*  PROT 6.3  ALBUMIN 3.2*    CBC Component Value Date/Time   WBC 4.8 05/26/2012 1535   RBC 4.00 05/26/2012 1535   HGB 11.2* 05/26/2012 1535   HCT 33.4* 05/26/2012 1535   PLT 165 05/26/2012 1535   MCV 83.5 05/26/2012 1535   MCH 28.0 05/26/2012 1535   MCHC 33.5 05/26/2012 1535   RDW 14.3 05/26/2012 1535   LYMPHSABS 1.7 05/26/2012 1535   MONOABS 0.3 05/26/2012 1535   EOSABS 0.0 05/26/2012 1535   BASOSABS 0.0 05/26/2012 1535    Coagulation:  05/26/12 1535  LABPROT 13.1  INR 1.00    Misc. Labs: FOBT POSITIVE   Assessment and Plan:  1.   Rectal bleeding:  Most likely a hemorrhagic hemorrhoid. Unlikely to be diverticular bleeding since she is without a colon.gastroenterology has been consult. We are obtaining type and screen and  serial CBCs to monitor hemoglobin, but the patient is currently stable.  - Gastroenterology consulted - Serial CBCs - Type and screen - PTT - Lactic acid - EKG  2.   Stage 3 chronic kidney disease:  Baseline creatinine in the range of 1.4-1.8. Currently, her creatinine is 1.67.  Estimated GFR is 33, representing stage III chronic kidney disease. No evidence of superimposed acute kidney injury.  3.   Hypertension:  At home, she takes metoprolol tartrate 25 mg twice a day. Her blood pressures here are elevated.  Over the past year, her blood pressures appear to have been reasonably well controlled with an average blood pressure around 145/80. - Continue metoprolol tartrate  25 mg twice a day  4.   History of stroke:  At home, she takes clopidogrel 75 mg a day. Has residual right sided weakness and numbness and slurred speech. In the setting of continued bleeding, we will hold clopidogrel despite the increased risk of stroke that this entails.  We will wait to here from GI before restarting this. - Hold clopidogrel for now  5.   Chronic anemia:  Baseline hemoglobin is in the range of 10-11. Last iron panel was in 2010: iron low at 33, TIBC 255, %sat low at 13, and ferritin 110. She undoubtedly still has iron deficiency-induced anemia from chronic blood loss from hemorrhaging hemorrhoids. Once we have this acute bleeding under control, she will probably benefit from iron supplementation. No need to repeat iron studies at this time.   6.   Seasonal allergies:  Patient has an intermittent, nonproductive cough; runny nose; scratchy throat; and sinonasal congestion. Symptomatology consistent with seasonal allergic rhinitis. Robitussin for cough relief.  7.   Prophylaxis:  SCDs  8.   Disposition:  [facility]. We'll followup with him. Expected length of stay greater than 2 nights.     Signed by:  Dorthula Rue. Earlene Plater, MD PGY-I, Internal Medicine  05/26/2012, 8:55 PM

## 2012-05-27 ENCOUNTER — Encounter (HOSPITAL_COMMUNITY): Payer: Self-pay | Admitting: Physician Assistant

## 2012-05-27 DIAGNOSIS — K5732 Diverticulitis of large intestine without perforation or abscess without bleeding: Secondary | ICD-10-CM

## 2012-05-27 DIAGNOSIS — D509 Iron deficiency anemia, unspecified: Secondary | ICD-10-CM

## 2012-05-27 DIAGNOSIS — K922 Gastrointestinal hemorrhage, unspecified: Secondary | ICD-10-CM

## 2012-05-27 DIAGNOSIS — Z86010 Personal history of colonic polyps: Secondary | ICD-10-CM

## 2012-05-27 LAB — URINALYSIS, ROUTINE W REFLEX MICROSCOPIC
Bilirubin Urine: NEGATIVE
Nitrite: NEGATIVE
Specific Gravity, Urine: 1.017 (ref 1.005–1.030)
pH: 5 (ref 5.0–8.0)

## 2012-05-27 LAB — URINE MICROSCOPIC-ADD ON

## 2012-05-27 LAB — CBC
Hemoglobin: 9.2 g/dL — ABNORMAL LOW (ref 12.0–15.0)
MCH: 28.1 pg (ref 26.0–34.0)
RBC: 3.27 MIL/uL — ABNORMAL LOW (ref 3.87–5.11)

## 2012-05-27 LAB — MRSA PCR SCREENING: MRSA by PCR: NEGATIVE

## 2012-05-27 MED ORDER — HYDROCORTISONE ACETATE 25 MG RE SUPP
25.0000 mg | Freq: Two times a day (BID) | RECTAL | Status: DC
  Administered 2012-05-27 – 2012-05-29 (×5): 25 mg via RECTAL
  Filled 2012-05-27 (×7): qty 1

## 2012-05-27 MED ORDER — ENSURE COMPLETE PO LIQD
237.0000 mL | Freq: Two times a day (BID) | ORAL | Status: DC
  Administered 2012-05-27 – 2012-05-29 (×3): 237 mL via ORAL

## 2012-05-27 NOTE — Care Management Note (Signed)
    Page 1 of 1   05/29/2012     3:14:32 PM   CARE MANAGEMENT NOTE 05/29/2012  Patient:  [name], [name]   Account Number:  [id number]  Date Initiated:  05/27/2012  Documentation initiated by:  Letha Cape  Subjective/Objective Assessment:   dx gib  admit- lives with daughter.pt [facility] , she just recently started.     Action/Plan:   pt recs snf   Anticipated DC Date:  05/29/2012   Anticipated DC Plan:  HOME/SELF CARE  In-house referral  Clinical Social Worker      DC Planning Services  CM consult      Choice offered to / List presented to:             Status of service:  Completed, signed off Medicare Important Message given?   (If response is "NO", the following Medicare IM given date fields will be blank) Date Medicare IM given:   Date Additional Medicare IM given:    Discharge Disposition:  HOME/SELF CARE  Per UR Regulation:  Reviewed for med. necessity/level of care/duration of stay  If discussed at Long Length of Stay Meetings, dates discussed:    Comments:  05/29/12 15:12 Letha Cape RN, BSN [phone] patient dc tome, no needs anticipated.  05/27/12 16:32 Letha Cape RN, BSN [phone] patient lives with daughter, [facility]- [facility] .  Patient states she uses a rolling walker.

## 2012-05-27 NOTE — Progress Notes (Signed)
UR Completed.  [name], [name] [phone] 05/27/2012  

## 2012-05-27 NOTE — H&P (Signed)
Internal Medicine Attending Admission Note Date: 05/27/2012  Patient name: [name] Medical record number: [id number] Date of birth: 01/14/1936 Age: 77 y.o. Gender: female  I saw and evaluated the patient. I reviewed the resident's note and I agree with the resident's findings and plan as documented in the resident's note.  Chief Complaint(s): GI bleed  History - key components related to admission: 77 year old female with past medical history most significant for previous CVAs, diverticulosis, stage III chronic kidney disease, subtotal colectomy in 2000 and who presented to the emergency department after bloody bowel movement. The episode happened on the morning of admission when she noted gross blood in the commode. She did not quantify the amount of blood for me. Had another episode in the emergency room. Patient denies any abdominal pain, chest pain, dizziness, dyspnea, nausea, vomiting and bleeding from any other sites. Patient does have hemorrhoids and reports occasional bleeding from time to time.  Patient was eating comfortably by the side of the bed at the time of  my visit.  15 point review of system is negative except for as noted above.  Past medical history, past surgical history, medications, family history and social history was reviewed as per resident's note.  Physical Exam - key components related to admission:  Filed Vitals:   05/26/12 1945 05/26/12 2000 05/26/12 2159 05/27/12 0526  BP: 148/54 164/83 160/67 115/68  Pulse: 61 66 62 61  Temp:   98 F (36.7 C) 97.4 F (36.3 C)  TempSrc:   Oral Oral  Resp: 18 16 16 16   Height:   5\' 6"  (1.676 m)   Weight:   120 lb 5.9 oz (54.6 kg)   SpO2: 97% 99% 100% 100%  Physical Exam: General: Vital signs reviewed and noted. Well-developed, well-nourished, in no acute distress; alert, appropriate and cooperative throughout examination.  Head: Normocephalic, atraumatic.  Eyes: PERRL, EOMI, No signs of anemia or jaundince.   Nose: Mucous membranes moist, not inflammed, nonerythematous.  Throat: Oropharynx nonerythematous, no exudate appreciated.   Neck: No deformities, masses, or tenderness noted.Supple, No carotid Bruits, no JVD.  Lungs:  Normal respiratory effort. Clear to auscultation BL without crackles or wheezes.  Heart: RRR. S1 and S2 normal without gallop, murmur, or rubs.  Abdomen:  BS normoactive. Soft, Nondistended, non-tender.  No masses or organomegaly.  Extremities: 1+ pitting edema bilaterally in the ankles  Neurologic: A&O X3, CN II - XII are grossly intact. Motor strength is 5/5 in the all 4 extremities, Sensations intact to light touch, Cerebellar signs negative.  Skin: No visible rashes, scars.   Lab results:  Basic Metabolic Panel:  Recent Labs  40/98/11 1535 05/26/12 2038  NA 138  --   K 4.0  --   CL 104  --   CO2 26  --   GLUCOSE 151*  --   BUN 33*  --   CREATININE 1.67*  --   CALCIUM 8.7  --   MG  --  2.1  PHOS  --  2.6   Liver Function Tests:  Recent Labs  05/26/12 1535  AST 20  ALT 11  ALKPHOS 53  BILITOT 0.2*  PROT 6.3  ALBUMIN 3.2*   CBC:  Recent Labs  05/26/12 1535  05/26/12 2326 05/27/12 0723  WBC 4.8  < > 5.4 3.8*  NEUTROABS 2.7  --   --   --   HGB 11.2*  < > 10.0* 9.2*  HCT 33.4*  < > 30.0* 27.1*  MCV 83.5  < >  83.6 82.9  PLT 165  < > 158 145*  < > = values in this interval not displayed.  Recent Labs  05/26/12 1535  INR 1.00   Imaging results:  X-ray Chest Pa And Lateral   05/26/2012  *RADIOLOGY REPORT*  Clinical Data: Cough, congestion.  CHEST - 2 VIEW  Comparison: 03/23/2012  Findings: Heart and mediastinal contours are within normal limits. No focal opacities or effusions.  No acute bony abnormality.  IMPRESSION: No active cardiopulmonary disease.   Original Report Authenticated By: Charlett Nose, M.D.     Other results: EKG:  61 beats per minute, normal axis, sinus rhythm, first degree AV block noted, otherwise normal EKG  Assessment  & Plan by Problem:  Patient is a 77 year old female with past medical history as noted above was being admitted for GI bleed most likely secondary from hemorrhoids but GI bleed cannot be ruled out. Patient's hemoglobin is stable at this time and does not require transfusion. Patient's situation is complicated by need for platelet inhibitor given her extensive history of stroke.  We will await gastroenterology recommendations prior to discharging this patient.  Rest of the medical management as per resident note.  Lars Mage MD Faculty-Internal Medicine Residency Program

## 2012-05-27 NOTE — Progress Notes (Signed)
Subjective: RN and patient noted blood in urine while urinating.  Patient also had blood bowel movement yesterday with blood and clots.  She denies abdominal pain, nausea, vomiting, chest pain, sob.    Objective: Vital signs in last 24 hours: Filed Vitals:   05/26/12 1945 05/26/12 2000 05/26/12 2159 05/27/12 0526  BP: 148/54 164/83 160/67 115/68  Pulse: 61 66 62 61  Temp:   98 F (36.7 C) 97.4 F (36.3 C)  TempSrc:   Oral Oral  Resp: 18 16 16 16   Height:   5\' 6"  (1.676 m)   Weight:   120 lb 5.9 oz (54.6 kg)   SpO2: 97% 99% 100% 100%   Weight change:   Intake/Output Summary (Last 24 hours) at 05/27/12 1201 Last data filed at 05/27/12 0832  Gross per 24 hour  Intake 700.08 ml  Output    550 ml  Net 150.08 ml   Vitals reviewed. General: resting in recliner, NAD, alert and oriented x 3  HEENT: [place]/at, no scleral icterus Cardiac: RRR, no rubs, murmurs or gallops Pulm: clear to auscultation bilaterally, no wheezes, rales, or rhonchi Abd: soft, nontender, nondistended, BS present Ext: warm and well perfused, 1+ pedal edema right>left  Neuro: alert and oriented X3, grossly neurologically intact, speech impaired post stroke   Lab Results: Basic Metabolic Panel:  Recent Labs Lab 05/26/12 1535 05/26/12 2038  NA 138  --   K 4.0  --   CL 104  --   CO2 26  --   GLUCOSE 151*  --   BUN 33*  --   CREATININE 1.67*  --   CALCIUM 8.7  --   MG  --  2.1  PHOS  --  2.6   Liver Function Tests:  Recent Labs Lab 05/26/12 1535  AST 20  ALT 11  ALKPHOS 53  BILITOT 0.2*  PROT 6.3  ALBUMIN 3.2*   CBC:  Recent Labs Lab 05/26/12 1535  05/26/12 2326 05/27/12 0723  WBC 4.8  < > 5.4 3.8*  NEUTROABS 2.7  --   --   --   HGB 11.2*  < > 10.0* 9.2*  HCT 33.4*  < > 30.0* 27.1*  MCV 83.5  < > 83.6 82.9  PLT 165  < > 158 145*  < > = values in this interval not displayed. Coagulation:  Recent Labs Lab 05/26/12 1535  LABPROT 13.1  INR 1.00   Misc. Labs: UA  Micro  Results: Recent Results (from the past 240 hour(s))  MRSA PCR SCREENING     Status: None   Collection Time    05/26/12 11:19 PM      Result Value Range Status   MRSA by PCR NEGATIVE  NEGATIVE Final   Comment:            The GeneXpert MRSA Assay (FDA     approved for NASAL specimens     only), is one component of a     comprehensive MRSA colonization     surveillance program. It is not     intended to diagnose MRSA     infection nor to guide or     monitor treatment for     MRSA infections.   Studies/Results: X-ray Chest Pa And Lateral   05/26/2012  *RADIOLOGY REPORT*  Clinical Data: Cough, congestion.  CHEST - 2 VIEW  Comparison: 03/23/2012  Findings: Heart and mediastinal contours are within normal limits. No focal opacities or effusions.  No acute bony abnormality.  IMPRESSION:  No active cardiopulmonary disease.   Original Report Authenticated By: Charlett Nose, M.D.    Medications:  Scheduled Meds: . antiseptic oral rinse  15 mL Mouth Rinse BID  . feeding supplement  237 mL Oral BID BM  . hydrocortisone  25 mg Rectal BID  . sodium chloride  3 mL Intravenous Q12H   Continuous Infusions: . sodium chloride 75 mL/hr at 05/27/12 1059   PRN Meds:.sodium chloride, acetaminophen, acetaminophen, albuterol, guaiFENesin-dextromethorphan, ondansetron (ZOFRAN) IV, ondansetron, sodium chloride Assessment/Plan: 77 y.o PMH GI bleeding presents with BRBPM, FOBT positive.   1. Rectal bleeding -Most likely a hemorrhagic hemorrhoid. She has internal and external hemorrhoids.  She is not hemodynamically unstable. -[place] gastro[facility]ology has been consulted with recommendations -Monitor CBC -added anusol HC suppositories  -stopped IV Protonix per GI  -pending Dr. Regino Schultze input of flex sig  -Currently holding Plavix   2. chronic kidney disease -Baseline creatinine in the range of 1.4-1.8. Now creatinine is 1.67. Estimated GFR is 33, representing stage III chronic kidney disease. No evidence  of superimposed acute kidney injury.   3. Hypertension -monitor VS. BP 115/68  -Holding metoprolol tartrate 25 mg twice a day.  If  Elevated resume   4. History of stroke -At home, she takes clopidogrel 75 mg a day. Has residual right sided weakness and numbness and slurred speech.  -In the setting of continued GI bleeding, we will hold clopidogrel despite the increased risk of stroke that this entails. We will wait to here from GI before restarting this.  - Hold clopidogrel for now   5. Chronic normocytic anemia -Baseline hemoglobin is in the range of 10-11.  -pending anemia panel in the am  .  6. DVT Prophylaxis -SCDs   7. F/E/N -NS 75 cc/hr -will monitor BMET  -dys 3 diet    Disposition: [facility]. Expected length of stay 1 more day.   Dispo: Disposition is deferred at this time, awaiting improvement of current medical problems.  Anticipated discharge in approximately 1 day(s).   The patient does have a current PCP (REESE,BETTI D, MD)/[facility] physician, therefore will not be requiring [facility] follow-up after discharge.   The patient does not have transportation limitations that hinder transportation to clinic appointments.  .Services Needed at time of discharge: Y = Yes, Blank = No PT:   OT:   RN:   Equipment:   Other:     LOS: 1 day   Annett Gula [phone] 05/27/2012, 12:01 PM

## 2012-05-27 NOTE — Consult Note (Signed)
[facility] Consult: 9:57 AM 05/27/2012   Referring Provider:  Drs Garg and Tracy McLean of the teaching service Primary Care Physician:  REESE,BETTI D, MD Primary Gastroenterologist:  Dr. Leta Bucklin.    Reason for Consultation:  Lower GI bleed.   HPI: [name] is a 77 y.o. female.  Takes Plavix for hx multiple CVAs, PE in 2010. Has stage 3 CKD. Hx of iron def anemia. Suffers from protein-calorie malnutriton.   S/p 10/2008 total abdominal colectomy with ileorectal anastomosis for hx recurrent diverticulitis with associate stricture as well as hx of recurrent diverticular bleeds.  Last colonoscopy was in 2008 and she had no recurrence of adenomatous polyps (2000, 2002, 2005, 2010).  There wer 5 polyps noted in the resected colon on the pathology report.  Seen at GI office 12/2010 for low grade hematochezia attributed to internal hemorrhoids found on rectal exam. Anusol HC suppositories were prescribed.  However she does not recall ever using suppositories, certainly none in the last 12 months.  Still has occasional minor, painless rectal bleeding with formed BMs. She did not require flex sig which was entertained if bleeding were to continue and never seen in follow up.     Pt had 2 episodes of bleeding per rectum beginning 5/4 in AM.  These were of much larger volume than in past, sounds like at least 4 oz per episode.     No associated dizziness, abdominal or rectal pain.  No nausea, vomiting.   Hgb 10 yesterday and 9.2 today. MCV is 82.    Hgb/MCV in 03/2012 was 10.5/78 during admission for hyponatremia.   Has never had dysphagia though describes intermittent solid/liquid dysphagia where bolus encounters tight area in region of upper esophagus.  She never has to regurgitate the food.   Her appetite varies and she reports gradual weight loss.  Moves formed stool daily, with occasional days with no BM.     Past Medical History   Diagnosis Date  . Hypertension   . Stroke May 2009    multiple, last one Nov 2012, 2 strokes with right sided deficits and slurred speech  . Arthritis     hands  . Depression   . Asthma   . Hypochromic anemia   . Hyperlipidemia   . Hx of adenomatous colonic polyps   . Diverticulosis   . Chronic kidney disease (CKD), stage III (moderate)     Past Surgical History  Procedure Laterality Date  . Vascular surgery    . Abdominal hysterectomy    . Other surgical history      2 ear surgeries  . Tonsillectomy    . Abdominal surgery    . Colectomy    . Subtotal colectomy  2010    Prior to Admission medications   Medication Sig Start Date End Date Taking? Authorizing Provider  clopidogrel (PLAVIX) 75 MG tablet Take 75 mg by mouth daily.     Yes Historical Provider, MD  feeding supplement (ENSURE COMPLETE) LIQD Take 237 mLs by mouth 2 (two) times daily between meals. 03/25/12  Yes Solianny D Kennerly, MD  metoprolol tartrate (LOPRESSOR) 25 MG tablet Take 25 mg by mouth 2 (two) times daily.     Yes Historical Provider, MD    Scheduled Meds: . antiseptic oral rinse  15 mL Mouth Rinse BID  . pantoprazole (PROTONIX) IV  40 mg Intravenous QHS  . sodium chloride  3 mL Intravenous Q12H   Infusions: . sodium chloride 75 mL/hr at 05/27/12 0300     PRN Meds: sodium chloride, acetaminophen, acetaminophen, albuterol, guaiFENesin-dextromethorphan, ondansetron (ZOFRAN) IV, ondansetron, sodium chloride   Allergies as of 05/26/2012 - Review Complete 05/26/2012  Allergen Reaction Noted  . Aspirin Nausea And Vomiting     Family History  Problem Relation Age of Onset  . Diabetes Paternal Aunt   . Colon cancer Neg Hx   . Stroke Father   . HIV Brother   . Heart disease Father   . Diabetes Father   . Prostate cancer    . Depression Father   . Cancer Sister     unknown type    History   Social History  . Marital Status: Single    Spouse Name: N/A    Number of Children: N/A  . Years  of Education: N/A   Occupational History  . Not on file.   Social History Main Topics  . Smoking status: Former Smoker  . Smokeless tobacco: Never Used  . Alcohol Use: No  . Drug Use: No  . Sexually Active: No    REVIEW OF SYSTEMS: Constitutional:  Many years ago weighed 200#.  Progressive weight loss over the decades ENT:  No nose bleeds Pulm:  No SOB, no PND, no cough CV:  No chest pain, no palpitations GU:  Occasional bladder leakage GI:  No stool incontinence MS:  Stiff hands from arthritis.  Heme:  Per HPI.    Transfusions:  None mentioned in notes.  Chronic Iron for many years Neuro:  Right hemiplegia and dysarthria.  Derm:  No itching or rash.  Has sore on left outer ankle for > 2 months Endocrine:  No excessive thirst nor sweats no polyuria.  No hx thyroid disease Immunization: flu shot in 03/2012  Travel:  None beyond local counties.    PHYSICAL EXAM: Vital signs in last 24 hours: Temp:  [97.4 F (36.3 C)-98.5 F (36.9 C)] 97.4 F (36.3 C) (05/05 0526) Pulse Rate:  [56-88] 61 (05/05 0526) Resp:  [11-21] 16 (05/05 0526) BP: (113-174)/(53-116) 115/68 mmHg (05/05 0526) SpO2:  [93 %-100 %] 100 % (05/05 0526) Weight:  [54.6 kg (120 lb 5.9 oz)] 54.6 kg (120 lb 5.9 oz) (05/04 2159)  General: pleasant elderly, frail AAF with expressive aphasia.  She is not acutely ill.  Head:  Symmetric without trauma or swelling  Eyes:  No icterus, EOMI Ears:  Slightly HOH  Nose:  No  Congestion, drainage or sneezing Mouth:  Moist, clear , pink oral MM.  Tongue is midline Neck:  No mass or thyromegaly.  No bruits Lungs:  Clear B.  Unlabored breathing Heart: RRR.  No MRG Abdomen:  Soft, NT, ND, no mass, no bruits, no HSM.   Rectal: deep red blood on exam glove, small amount.  No masses, no palpable hemorrhoids or mass   Musc/Skeltl: no joint swelling or contractures Extremities:  Slight non-pitting pedal edema.  Ulcer of left lateral malleolus is bandaged  Neurologic:   Expressive aphasia, oriented x 3.  Good though slow historian.  Very detail oriented.  Needed assistance to rise from chair but able to shuffle to bed with minor assist.  Skin:  No rash, no sores.  Slight vitiligo type hypopigmentation on face around eyes Tattoos:  none Nodes:  No inguinal adenopathy.    Psych:  Very pleasant.  No agitation.   Intake/Output from previous day: 05/04 0701 - 05/05 0700 In: 700.1 [I.V.:700.1] Out: 400 [Stool:400] Intake/Output this shift: Total I/O In: 0  Out: 150 [Urine:150]  LAB RESULTS:  Recent   Labs  05/26/12 2038 05/26/12 2326 05/27/12 0723  WBC 4.9 5.4 3.8*  HGB 11.0* 10.0* 9.2*  HCT 33.7* 30.0* 27.1*  PLT 165 158 145*   BMET Lab Results  Component Value Date   NA 138 05/26/2012   NA 132* 03/25/2012   NA 131* 03/24/2012   K 4.0 05/26/2012   K 4.3 03/25/2012   K 3.4* 03/24/2012   CL 104 05/26/2012   CL 103 03/25/2012   CL 94* 03/24/2012   CO2 26 05/26/2012   CO2 24 03/25/2012   CO2 30 03/24/2012   GLUCOSE 151* 05/26/2012   GLUCOSE 85 03/25/2012   GLUCOSE 139* 03/24/2012   BUN 33* 05/26/2012   BUN 17 03/25/2012   BUN 12 03/24/2012   CREATININE 1.67* 05/26/2012   CREATININE 1.55* 03/25/2012   CREATININE 1.51* 03/24/2012   CALCIUM 8.7 05/26/2012   CALCIUM 8.3* 03/25/2012   CALCIUM 9.0 03/24/2012   LFT  Recent Labs  05/26/12 1535  PROT 6.3  ALBUMIN 3.2*  AST 20  ALT 11  ALKPHOS 53  BILITOT 0.2*   PT/INR Lab Results  Component Value Date   INR 1.00 05/26/2012   INR 1.08 11/28/2010   INR 1.23 07/25/2010    RADIOLOGY STUDIES: X-ray Chest Pa And Lateral  05/26/2012     IMPRESSION: No active cardiopulmonary disease.   Original Report Authenticated By: Kevin Dover, M.D.     ENDOSCOPIC STUDIES: 04/2006  Colonoscopy for Surveillance of:  Adenomatous Polyp(s). Initial polypectomy was performed in 2000. 1-2  Polyps were found at Index Exam. Largest polyp removed was 1 to 5 mm.  Prior polyp located in proximal (splenic flexure and beyond) colon.  Pathology of worst  polyp: tubular adenoma. 2004.  Comments:  hx of recent diverticulitis, s/p remote diverticular bleed Findings  - STRICTURE / STENOSIS: Stenosis in Sigmoid Colon. Constriction:  partial. 40 cm from anus. Lumen diameter is 5 mm. Comments:  edematous mucosa ans spasm, many diverticuli, tortuous lumen and  poor prep made it impossible to pass through ,high risk for  perforation, pseudopolyps at 40 cm, appear inflammatory. ICD9: .  - DIVERTICULOSIS: Sigmoid Colon. ICD9: Diverticulosis, Colon: 562.10.  Comments: severe diverticulosis, lumen narrow, spastic colon.  - NORMAL EXAM: Rectum.    IMPRESSION: *  Rectal  Bleeding.  Suspect hemrrohoidal. However not sure if she has any diverticular disease in rectal remnant that could be bleeding.   *  S/p 2010 total abdominal colectomy with ileorectal anastomosis for stricturing from recurrent diverticulitis and well as hx of multiple lower GI bleeds from severe, pan diverticulosis.  *  Chronic Plavix for hx multiple CVAs as well as hx of PE.  On hold *  Stage 3 chronic kidney disease, GFR is 33. *  Iron deficiency anemia.  On chronic oral iron supplements. Hgb down 1.8 grams. However baseline is around 10.5. *  Multiple recurrences of adenomatous colon polyps 200 through the colectomy in 2010, none dysplastic.  *  Intermittent dysphagia in region of upper esophagus.  Not severe.  *  Dementia. ??  She does not seem demented to me, just has expressive aphasia. *  Protein malnutrition, BMI is 19.5, weight 54 kg.   PLAN: *  Let her eat, mech soft diet due to intermittent dysphagia.  Restart Ensure between meals.   *  Add anusol HC suppositories.  *  Stop IV Protonix, this is LGIB and she was on no PPI at home.  *  Decision re Flex sig per Dr Sue Mcalexander.    Her need for chronic Plavix may push the MD towards flex sig.  *  ? Need for cardiac monitoring?  She is not unstable *  Changed CBC to tomorrow AM.   LOS: 1 day   Emelee Gribbin  05/27/2012, 9:57  AM Pager: [phone] I have reviewed the above note, examined the patient and agree with plan of treatment. She is well known to me, many diverticular bleeds and finally diverticular stricture necessitated subtotal colectomy. I would like to do a brief flex.sigmoid to localize the bleeding. She agrees with the plan.  Maribel Luis,MD [facility] Pager # [phone]      

## 2012-05-27 NOTE — Progress Notes (Signed)
[name] [id number] Admitted to 5522: 05/26/2012 2200 Attending Provider: Lars Mage, MD    [name] is a 77 y.o. female patient admitted from ED awake, alert  & orientated  X 3,  Full code , VSS - Blood pressure 160/67, pulse 62, temperature 98 F (36.7 C), temperature source Oral, resp. rate 16, height 5\' 6"  (1.676 m), weight 54.6 kg (120 lb 5.9 oz), SpO2 100.00%.RA, no c/o shortness of breath, no c/o chest pain, no distress noted. Tele # [id number] placed and pt is currently running first degree heart block.   IV site WDL:  antecubital left, condition patent and no redness with a transparent dsg that's clean dry and intact.  Allergies:   Allergies  Allergen Reactions  . Aspirin Nausea And Vomiting    Hurts stomach     Past Medical History  Diagnosis Date  . Hypertension   . Stroke May 2009    multiple, last one Nov 2012, 2 strokes with right sided deficits and slurred speech  . Arthritis     hands  . Depression   . Asthma   . Hypochromic anemia   . Hyperlipidemia   . Hx of adenomatous colonic polyps   . Diverticulosis   . Chronic kidney disease (CKD), stage III (moderate)     History:  obtained from the patient.  Pt orientation to unit, room and routine. Information packet given to patient/family and safety video watched.  Admission INP armband ID verified with patient/family, and in place. SR up x 2, fall risk assessment complete with Patient and family verbalizing understanding of risks associated with falls. Pt verbalizes an understanding of how to use the call bell and to call for help before getting out of bed.  Skin, clean and dry with a pressure ulcer to the left lateral malleolus measuring 1x0.5 foam dressing applied, pt also has a bruise to the anterior left lower leg.    Will cont to monitor and assist as needed.  Julien Nordmann [facility], RN 05/27/2012 2:09 AM

## 2012-05-28 ENCOUNTER — Encounter (HOSPITAL_COMMUNITY)
Admission: EM | Disposition: A | Discharge status: Home / Self Care | Payer: Self-pay | Source: Home / Self Care | Attending: Internal Medicine

## 2012-05-28 ENCOUNTER — Encounter (HOSPITAL_COMMUNITY): Payer: Self-pay | Admitting: *Deleted

## 2012-05-28 HISTORY — PX: FLEXIBLE SIGMOIDOSCOPY: SHX5431

## 2012-05-28 LAB — CBC
HCT: 25.8 % — ABNORMAL LOW (ref 36.0–46.0)
Hemoglobin: 8.9 g/dL — ABNORMAL LOW (ref 12.0–15.0)
WBC: 4.1 10*3/uL (ref 4.0–10.5)

## 2012-05-28 LAB — FOLATE: Folate: 12.7 ng/mL

## 2012-05-28 LAB — IRON AND TIBC
Iron: 57 ug/dL (ref 42–135)
Saturation Ratios: 29 % (ref 20–55)
TIBC: 200 ug/dL — ABNORMAL LOW (ref 250–470)
UIBC: 143 ug/dL (ref 125–400)

## 2012-05-28 LAB — BASIC METABOLIC PANEL
BUN: 25 mg/dL — ABNORMAL HIGH (ref 6–23)
CO2: 24 mEq/L (ref 19–32)
Chloride: 108 mEq/L (ref 96–112)
Creatinine, Ser: 1.4 mg/dL — ABNORMAL HIGH (ref 0.50–1.10)

## 2012-05-28 LAB — VITAMIN B12: Vitamin B-12: 315 pg/mL (ref 211–911)

## 2012-05-28 SURGERY — SIGMOIDOSCOPY, FLEXIBLE
Anesthesia: Moderate Sedation

## 2012-05-28 MED ORDER — FENTANYL CITRATE 0.05 MG/ML IJ SOLN
INTRAMUSCULAR | Status: AC
  Filled 2012-05-28: qty 2

## 2012-05-28 MED ORDER — MIDAZOLAM HCL 10 MG/2ML IJ SOLN
INTRAMUSCULAR | Status: DC | PRN
  Administered 2012-05-28 (×2): 1 mg via INTRAVENOUS

## 2012-05-28 MED ORDER — HYDROCORTISONE ACETATE 25 MG RE SUPP: 25.0000 mg | Freq: Two times a day (BID) | RECTAL | Status: DC

## 2012-05-28 MED ORDER — ENSURE COMPLETE PO LIQD: 237.0000 mL | Freq: Two times a day (BID) | ORAL | Status: DC

## 2012-05-28 MED ORDER — SODIUM CHLORIDE 0.9 % IV SOLN: INTRAVENOUS | Status: DC

## 2012-05-28 MED ORDER — MIDAZOLAM HCL 5 MG/ML IJ SOLN
INTRAMUSCULAR | Status: AC
  Filled 2012-05-28: qty 2

## 2012-05-28 MED ORDER — ENSURE COMPLETE PO LIQD: 237.0000 mL | ORAL | Status: DC

## 2012-05-28 MED ORDER — FENTANYL CITRATE 0.05 MG/ML IJ SOLN
INTRAMUSCULAR | Status: DC | PRN
  Administered 2012-05-28: 25 ug via INTRAVENOUS

## 2012-05-28 MED ORDER — METOPROLOL TARTRATE 25 MG PO TABS
25.0000 mg | ORAL_TABLET | Freq: Two times a day (BID) | ORAL | Status: DC
Start: 2012-05-28 — End: 2012-05-29
  Administered 2012-05-28 – 2012-05-29 (×3): 25 mg via ORAL
  Filled 2012-05-28 (×4): qty 1

## 2012-05-28 NOTE — Interval H&P Note (Signed)
History and Physical Interval Note:  05/28/2012 7:04 AM  [name]  has presented today for surgery, with the diagnosis of rectal bleeding  The various methods of treatment have been discussed with the patient and family. After consideration of risks, benefits and other options for treatment, the patient has consented to  Procedure(s): FLEXIBLE SIGMOIDOSCOPY (N/A) as a surgical intervention .  The patient's history has been reviewed, patient examined, no change in status, stable for surgery.  I have reviewed the patient's chart and labs.  Questions were answered to the patient's satisfaction.     [name]

## 2012-05-28 NOTE — Op Note (Signed)
[facility] [address] [place], [address]   FLEXIBLE SIGMOIDOSCOPY PROCEDURE REPORT  PATIENT: [name], [name]  MR#: [id number] BIRTHDATE: 03-May-1935 , 77  yrs. old GENDER: Female ENDOSCOPIST: Hart Carwin, MD REFERRED BY: Dr Lars Mage PROCEDURE DATE:  05/28/2012 PROCEDURE:   Hemorrhoidectomy via banding, clips or ligation ASA CLASS:   Class III INDICATIONS:hematochezia.   hx of jhemorrhoids, hx of recurrent diverticular bleeds, post subtotal colectomy. MEDICATIONS: These medications were titrated to patient response per physician's verbal order, Fentanyl 25 mcg IV, and Versed 2 mg IV  DESCRIPTION OF PROCEDURE:   After the risks benefits and alternatives of the procedure were thoroughly explained, informed consent was obtained.  revealed decreased sphincter tone. The EC-[id number] ([id number]) and [id number] ([id number])  endoscope was introduced through the anus  and advanced to the sigmoid colon , limited by No adverse events experienced.   The quality of the prep was good .  The instrument was then slowly withdrawn as the mucosa was fully examined.         COLON FINDINGS: Large internal hemorrhoids were found.,showing stigmata of recent bleeding colorectal anastomosis at 20 cm widely patent, no recurrent diverticuli,  large internal hemorrhoids without prolapse, 7 Band shooter used to apply 7 bands on the hemorrhoids proximal to the dentate line, minimal bleeding Retroflexed views revealed no abnormalities.    The scope was then withdrawn from the patient and the procedure terminated.  COMPLICATIONS: There were no complications.  ENDOSCOPIC IMPRESSION: Large internal hemorrhoids , first grade, with stigmata of recent bleeding Hemorrhoidal band ligation x 7 proximal to the dentate line s/p subtotal colectomy for benign disease, widely patent anastomosis at 20 cm no diverticuli distal to the anastomosis  RECOMMENDATIONS: 1. rectal care 2.  pain control 3.AnusolHC supp bid 4. continue to hold Plavix x 2 weeks if possible  REPEAT EXAM: for No recall due to age..   _______________________________ eSignedHart Carwin, MD 05/28/2012 12:44 PM   CC:

## 2012-05-28 NOTE — Progress Notes (Signed)
Subjective.  Slept well.  No bleeding noted with water enema this AM.   She feels well  Exam Filed Vitals:   05/28/12 0605  BP: 125/54  Pulse: 98  Temp: 98.4 F (36.9 C)  Resp: 19   looks well, alert, NAD No labored breathing.  Not reexamined.  Labs CBC    Component Value Date/Time   WBC 4.1 05/28/2012 0614   RBC 3.15* 05/28/2012 0614   HGB 8.9* 05/28/2012 0614   HCT 25.8* 05/28/2012 0614   PLT 152 05/28/2012 0614   MCV 81.9 05/28/2012 0614   MCH 28.3 05/28/2012 0614   MCHC 34.5 05/28/2012 0614   RDW 14.5 05/28/2012 0614   LYMPHSABS 1.7 05/26/2012 1535   MONOABS 0.3 05/26/2012 1535   EOSABS 0.0 05/26/2012 1535   BASOSABS 0.0 05/26/2012 1535    IMPRESSION:  * Rectal Bleeding. Suspect hemrrohoidal. However not sure if she has any diverticular disease in rectal remnant that could be bleeding.  * S/p 2010 total abdominal colectomy with ileorectal anastomosis for stricturing from recurrent diverticulitis and well as hx of multiple lower GI bleeds from severe, pan diverticulosis.  * Chronic Plavix for hx multiple CVAs as well as hx of PE. On hold  * Stage 3 chronic kidney disease, GFR is 33.  * Iron deficiency anemia. On chronic oral iron supplements. Hgb down 2.1 grams overall.  Do not feel she needs transfusion.  * Multiple recurrences of adenomatous colon polyps 200 through the colectomy in 2010, none dysplastic.  * Dementia?? She does not seem demented to me, just has expressive aphasia.   Plan *  Flexible sigmoidoscopy today.

## 2012-05-28 NOTE — Progress Notes (Signed)
Internal Medicine Teaching Service Attending Note Date: 05/28/2012  Patient name: [name]  Medical record number: [id number]  Date of birth: [birth year]    This patient has been seen and discussed with the house staff. Please see their note for complete details. I concur with their findings with the following additions/corrections: treatment at this time depends on the outcome of sigmoidoscopy. Appreciate input from gastroenterology.  Lars Mage 05/28/2012, 8:16 PM

## 2012-05-28 NOTE — Progress Notes (Signed)
Post flexible sigmoidoscopy  With findings of large internal hemorrhoids which are  The source of rectal bleeding. We have banded 7 hemorrhoids.  Orders written to hold Plavix x 2 weeks and use Anusol HC supp bid

## 2012-05-28 NOTE — Progress Notes (Signed)
Subjective: Denies sob, abdominal pain, dysuria.  No complaints otherwise.   Objective: Vital signs in last 24 hours: Filed Vitals:   05/27/12 0526 05/27/12 1451 05/27/12 2059 05/28/12 0605  BP: 115/68 121/58 145/64 125/54  Pulse: 61 69 79 98  Temp: 97.4 F (36.3 C) 97.1 F (36.2 C) 98.5 F (36.9 C) 98.4 F (36.9 C)  TempSrc: Oral Oral Oral Oral  Resp: 16 16 16 19   Height:      Weight:      SpO2: 100% 100% 97% 100%   Weight change:   Intake/Output Summary (Last 24 hours) at 05/28/12 0817 Last data filed at 05/28/12 0606  Gross per 24 hour  Intake    560 ml  Output    771 ml  Net   -211 ml   Vitals reviewed. General: resting in bed, NAD, arousable and oriented x 3  HEENT: [place]/at, no scleral icterus Cardiac: RRR, no rubs, murmurs or gallops Pulm: clear to auscultation bilaterally, no wheezes, rales, or rhonchi Abd: soft, nontender, nondistended, BS present Ext: warm and well perfused, no significant edema noted  Neuro: arousable and oriented X3, grossly neurologically intact, speech impaired/aphasia post stroke   Lab Results: Basic Metabolic Panel:  Recent Labs Lab 05/26/12 1535 05/26/12 2038  NA 138  --   K 4.0  --   CL 104  --   CO2 26  --   GLUCOSE 151*  --   BUN 33*  --   CREATININE 1.67*  --   CALCIUM 8.7  --   MG  --  2.1  PHOS  --  2.6   Liver Function Tests:  Recent Labs Lab 05/26/12 1535  AST 20  ALT 11  ALKPHOS 53  BILITOT 0.2*  PROT 6.3  ALBUMIN 3.2*   CBC:  Recent Labs Lab 05/26/12 1535  05/27/12 0723 05/28/12 0614  WBC 4.8  < > 3.8* 4.1  NEUTROABS 2.7  --   --   --   HGB 11.2*  < > 9.2* 8.9*  HCT 33.4*  < > 27.1* 25.8*  MCV 83.5  < > 82.9 81.9  PLT 165  < > 145* 152  < > = values in this interval not displayed. Coagulation:  Recent Labs Lab 05/26/12 1535  LABPROT 13.1  INR 1.00   Misc. Labs: None   Micro Results: Recent Results (from the past 240 hour(s))  MRSA PCR SCREENING     Status: None   Collection Time    05/26/12 11:19 PM      Result Value Range Status   MRSA by PCR NEGATIVE  NEGATIVE Final   Comment:            The GeneXpert MRSA Assay (FDA     approved for NASAL specimens     only), is one component of a     comprehensive MRSA colonization     surveillance program. It is not     intended to diagnose MRSA     infection nor to guide or     monitor treatment for     MRSA infections.   Results for [name], [name] (MRN [id number]) as of 05/28/2012 08:17  Ref. Range 05/27/2012 14:16  Color, Urine Latest Range: YELLOW  YELLOW  APPearance Latest Range: CLEAR  HAZY (A)  Specific Gravity, Urine Latest Range: 1.005-1.030  1.017  pH Latest Range: 5.0-8.0  5.0  Glucose Latest Range: NEGATIVE mg/dL NEGATIVE  Bilirubin Urine Latest Range: NEGATIVE  NEGATIVE  Ketones, ur Latest  Range: NEGATIVE mg/dL NEGATIVE  Protein Latest Range: NEGATIVE mg/dL NEGATIVE  Urobilinogen, UA Latest Range: 0.0-1.0 mg/dL 0.2  Nitrite Latest Range: NEGATIVE  NEGATIVE  Leukocytes, UA Latest Range: NEGATIVE  MODERATE (A)  Hgb urine dipstick Latest Range: NEGATIVE  SMALL (A)  Urine-Other No range found RARE YEAST  WBC, UA Latest Range: <3 WBC/hpf 3-6  RBC / HPF Latest Range: <3 RBC/hpf 0-2  Squamous Epithelial / LPF Latest Range: RARE  MANY (A)  Bacteria, UA Latest Range: RARE  FEW (A)   Studies/Results: X-ray Chest Pa And Lateral   05/26/2012  *RADIOLOGY REPORT*  Clinical Data: Cough, congestion.  CHEST - 2 VIEW  Comparison: 03/23/2012  Findings: Heart and mediastinal contours are within normal limits. No focal opacities or effusions.  No acute bony abnormality.  IMPRESSION: No active cardiopulmonary disease.   Original Report Authenticated By: Charlett Nose, M.D.    Medications:  Scheduled Meds: . antiseptic oral rinse  15 mL Mouth Rinse BID  . feeding supplement  237 mL Oral BID BM  . hydrocortisone  25 mg Rectal BID  . sodium chloride  3 mL Intravenous Q12H   Continuous Infusions:   PRN Meds:.sodium chloride,  acetaminophen, acetaminophen, albuterol, guaiFENesin-dextromethorphan, ondansetron (ZOFRAN) IV, ondansetron, sodium chloride Assessment/Plan: 77 y.o PMH GI bleeding in 2010 with abdominal colectomy with ileorectal anastomosis for history of recurrent diverticulitis with associated stricture as well and recurrent diverticular bleeds.  She presents with BRBPM, FOBT positive.   1. Rectal bleeding -Most likely a hemorrhoid. She has internal and external hemorrhoids. -[facility] (Dr. Juanda Chance) has been consulted with recommendations pending flex sig today   -Monitor CBC -anusol HC suppositories  -Currently holding Plavix   2. chronic kidney disease -Baseline creatinine in the range of 1.4-1.8.  -BMET pending   3. Hypertension -monitor VS. BP 125/54 -Holding metoprolol tartrate 25 mg twice a day.  If  Elevated resume.  Will likely resume at d/c    4. History of stroke -At home, she takes clopidogrel 75 mg a day. Has residual right sided weakness and numbness and slurred speech and/or aphasia.  -hold clopidogrel for now  5. Chronic normocytic anemia -Baseline hemoglobin is in the range of 10-11. This am Hbg 8.9 -pending anemia panel .  6. DVT Prophylaxis -SCDs   7. F/E/N -NS 75 cc/hr -will monitor BMET  -NPO then resume dys 3    Disposition: [facility] patient. Expected length of stay 1 more day.   Dispo: Disposition is deferred at this time, awaiting improvement of current medical problems.  Anticipated discharge in approximately 1 day(s).   The patient does have a current PCP (REESE,BETTI D, MD)/[facility] physician, therefore will not be requiring OPC follow-up after discharge.   The patient does not have transportation limitations that hinder transportation to clinic appointments.  .Services Needed at time of discharge: Y = Yes, Blank = No PT:   OT:   RN:   Equipment:   Other:     LOS: 2 days   Annett Gula [phone] 05/28/2012, 8:17 AM

## 2012-05-28 NOTE — H&P (View-Only) (Signed)
[facility] Consult: 9:57 AM 05/27/2012   Referring Provider:  Drs Eben Burow and Desma Maxim of the teaching service Primary Care Physician:  [name], MD Primary Gastroenterologist:  Dr. [name].    Reason for Consultation:  Lower GI bleed.   HPI: [name] is a 77 y.o. female.  Takes Plavix for hx multiple CVAs, PE in 2010. Has stage 3 CKD. Hx of iron def anemia. Suffers from protein-calorie malnutriton.   S/p 10/2008 total abdominal colectomy with ileorectal anastomosis for hx recurrent diverticulitis with associate stricture as well as hx of recurrent diverticular bleeds.  Last colonoscopy was in 2008 and she had no recurrence of adenomatous polyps (2000, 2002, 2005, 2010).  There wer 5 polyps noted in the resected colon on the pathology report.  Seen at GI office 12/2010 for low grade hematochezia attributed to internal hemorrhoids found on rectal exam. Anusol HC suppositories were prescribed.  However she does not recall ever using suppositories, certainly none in the last 12 months.  Still has occasional minor, painless rectal bleeding with formed BMs. She did not require flex sig which was entertained if bleeding were to continue and never seen in follow up.     Pt had 2 episodes of bleeding per rectum beginning 5/4 in AM.  These were of much larger volume than in past, sounds like at least 4 oz per episode.     No associated dizziness, abdominal or rectal pain.  No nausea, vomiting.   Hgb 10 yesterday and 9.2 today. MCV is 82.    Hgb/MCV in 03/2012 was 10.5/78 during admission for hyponatremia.   Has never had dysphagia though describes intermittent solid/liquid dysphagia where bolus encounters tight area in region of upper esophagus.  She never has to regurgitate the food.   Her appetite varies and she reports gradual weight loss.  Moves formed stool daily, with occasional days with no BM.     Past Medical History   Diagnosis Date  . Hypertension   . Stroke May 2009    multiple, last one Nov 2012, 2 strokes with right sided deficits and slurred speech  . Arthritis     hands  . Depression   . Asthma   . Hypochromic anemia   . Hyperlipidemia   . Hx of adenomatous colonic polyps   . Diverticulosis   . Chronic kidney disease (CKD), stage III (moderate)     Past Surgical History  Procedure Laterality Date  . Vascular surgery    . Abdominal hysterectomy    . Other surgical history      2 ear surgeries  . Tonsillectomy    . Abdominal surgery    . Colectomy    . Subtotal colectomy  2010    Prior to Admission medications   Medication Sig Start Date End Date Taking? Authorizing Provider  clopidogrel (PLAVIX) 75 MG tablet Take 75 mg by mouth daily.     Yes Historical Provider, MD  feeding supplement (ENSURE COMPLETE) LIQD Take 237 mLs by mouth 2 (two) times daily between meals. 03/25/12  Yes Ky Barban, MD  metoprolol tartrate (LOPRESSOR) 25 MG tablet Take 25 mg by mouth 2 (two) times daily.     Yes Historical Provider, MD    Scheduled Meds: . antiseptic oral rinse  15 mL Mouth Rinse BID  . pantoprazole (PROTONIX) IV  40 mg Intravenous QHS  . sodium chloride  3 mL Intravenous Q12H   Infusions: . sodium chloride 75 mL/hr at 05/27/12 0300  PRN Meds: sodium chloride, acetaminophen, acetaminophen, albuterol, guaiFENesin-dextromethorphan, ondansetron (ZOFRAN) IV, ondansetron, sodium chloride   Allergies as of 05/26/2012 - Review Complete 05/26/2012  Allergen Reaction Noted  . Aspirin Nausea And Vomiting     Family History  Problem Relation Age of Onset  . Diabetes Paternal Aunt   . Colon cancer Neg Hx   . Stroke Father   . HIV Brother   . Heart disease Father   . Diabetes Father   . Prostate cancer    . Depression Father   . Cancer Sister     unknown type    History   Social History  . Marital Status: Single    Spouse Name: N/A    Number of Children: N/A  . Years  of Education: N/A   Occupational History  . Not on file.   Social History Main Topics  . Smoking status: Former Games developer  . Smokeless tobacco: Never Used  . Alcohol Use: No  . Drug Use: No  . Sexually Active: No    REVIEW OF SYSTEMS: Constitutional:  Many years ago weighed 200#.  Progressive weight loss over the decades ENT:  No nose bleeds Pulm:  No SOB, no PND, no cough CV:  No chest pain, no palpitations GU:  Occasional bladder leakage GI:  No stool incontinence MS:  Stiff hands from arthritis.  Heme:  Per HPI.    Transfusions:  None mentioned in notes.  Chronic Iron for many years Neuro:  Right hemiplegia and dysarthria.  Derm:  No itching or rash.  Has sore on left outer ankle for > 2 months Endocrine:  No excessive thirst nor sweats no polyuria.  No hx thyroid disease Immunization: flu shot in 03/2012  Travel:  None beyond local counties.    PHYSICAL EXAM: Vital signs in last 24 hours: Temp:  [97.4 F (36.3 C)-98.5 F (36.9 C)] 97.4 F (36.3 C) (05/05 0526) Pulse Rate:  [56-88] 61 (05/05 0526) Resp:  [11-21] 16 (05/05 0526) BP: (113-174)/(53-116) 115/68 mmHg (05/05 0526) SpO2:  [93 %-100 %] 100 % (05/05 0526) Weight:  [54.6 kg (120 lb 5.9 oz)] 54.6 kg (120 lb 5.9 oz) (05/04 2159)  General: pleasant elderly, frail AAF with expressive aphasia.  She is not acutely ill.  Head:  Symmetric without trauma or swelling  Eyes:  No icterus, EOMI Ears:  Slightly HOH  Nose:  No  Congestion, drainage or sneezing Mouth:  Moist, clear , pink oral MM.  Tongue is midline Neck:  No mass or thyromegaly.  No bruits Lungs:  Clear B.  Unlabored breathing Heart: RRR.  No MRG Abdomen:  Soft, NT, ND, no mass, no bruits, no HSM.   Rectal: deep red blood on exam glove, small amount.  No masses, no palpable hemorrhoids or mass   Musc/Skeltl: no joint swelling or contractures Extremities:  Slight non-pitting pedal edema.  Ulcer of left lateral malleolus is bandaged  Neurologic:   Expressive aphasia, oriented x 3.  Good though slow historian.  Very detail oriented.  Needed assistance to rise from chair but able to shuffle to bed with minor assist.  Skin:  No rash, no sores.  Slight vitiligo type hypopigmentation on face around eyes Tattoos:  none Nodes:  No inguinal adenopathy.    Psych:  Very pleasant.  No agitation.   Intake/Output from previous day: 05/04 0701 - 05/05 0700 In: 700.1 [I.V.:700.1] Out: 400 [Stool:400] Intake/Output this shift: Total I/O In: 0  Out: 150 [Urine:150]  LAB RESULTS:  Recent  Labs  05/26/12 2038 05/26/12 2326 05/27/12 0723  WBC 4.9 5.4 3.8*  HGB 11.0* 10.0* 9.2*  HCT 33.7* 30.0* 27.1*  PLT 165 158 145*   BMET Lab Results  Component Value Date   NA 138 05/26/2012   NA 132* 03/25/2012   NA 131* 03/24/2012   K 4.0 05/26/2012   K 4.3 03/25/2012   K 3.4* 03/24/2012   CL 104 05/26/2012   CL 103 03/25/2012   CL 94* 03/24/2012   CO2 26 05/26/2012   CO2 24 03/25/2012   CO2 30 03/24/2012   GLUCOSE 151* 05/26/2012   GLUCOSE 85 03/25/2012   GLUCOSE 139* 03/24/2012   BUN 33* 05/26/2012   BUN 17 03/25/2012   BUN 12 03/24/2012   CREATININE 1.67* 05/26/2012   CREATININE 1.55* 03/25/2012   CREATININE 1.51* 03/24/2012   CALCIUM 8.7 05/26/2012   CALCIUM 8.3* 03/25/2012   CALCIUM 9.0 03/24/2012   LFT  Recent Labs  05/26/12 1535  PROT 6.3  ALBUMIN 3.2*  AST 20  ALT 11  ALKPHOS 53  BILITOT 0.2*   PT/INR Lab Results  Component Value Date   INR 1.00 05/26/2012   INR 1.08 11/28/2010   INR 1.23 07/25/2010    RADIOLOGY STUDIES: X-ray Chest Pa And Lateral  05/26/2012     IMPRESSION: No active cardiopulmonary disease.   Original Report Authenticated By: Charlett Nose, M.D.     ENDOSCOPIC STUDIES: 04/2006  Colonoscopy for Surveillance of:  Adenomatous Polyp(s). Initial polypectomy was performed in 2000. 1-2  Polyps were found at Index Exam. Largest polyp removed was 1 to 5 mm.  Prior polyp located in proximal (splenic flexure and beyond) colon.  Pathology of worst  polyp: tubular adenoma. 2004.  Comments:  hx of recent diverticulitis, s/p remote diverticular bleed Findings  - STRICTURE / STENOSIS: Stenosis in Sigmoid Colon. Constriction:  partial. 40 cm from anus. Lumen diameter is 5 mm. Comments:  edematous mucosa ans spasm, many diverticuli, tortuous lumen and  poor prep made it impossible to pass through ,high risk for  perforation, pseudopolyps at 40 cm, appear inflammatory. ICD9: .  - DIVERTICULOSIS: Sigmoid Colon. ICD9: Diverticulosis, Colon: 562.10.  Comments: severe diverticulosis, lumen narrow, spastic colon.  - NORMAL EXAM: Rectum.    IMPRESSION: *  Rectal  Bleeding.  Suspect hemrrohoidal. However not sure if she has any diverticular disease in rectal remnant that could be bleeding.   *  S/p 2010 total abdominal colectomy with ileorectal anastomosis for stricturing from recurrent diverticulitis and well as hx of multiple lower GI bleeds from severe, pan diverticulosis.  *  Chronic Plavix for hx multiple CVAs as well as hx of PE.  On hold *  Stage 3 chronic kidney disease, GFR is 33. *  Iron deficiency anemia.  On chronic oral iron supplements. Hgb down 1.8 grams. However baseline is around 10.5. *  Multiple recurrences of adenomatous colon polyps 200 through the colectomy in 2010, none dysplastic.  *  Intermittent dysphagia in region of upper esophagus.  Not severe.  *  Dementia. ??  She does not seem demented to me, just has expressive aphasia. *  Protein malnutrition, BMI is 19.5, weight 54 kg.   PLAN: *  Let her eat, mech soft diet due to intermittent dysphagia.  Restart Ensure between meals.   *  Add anusol HC suppositories.  *  Stop IV Protonix, this is LGIB and she was on no PPI at home.  *  Decision re Flex sig per Dr Juanda Chance.  Her need for chronic Plavix may push the MD towards flex sig.  *  ? Need for cardiac monitoring?  She is not unstable *  Changed CBC to tomorrow AM.   LOS: 1 day   Afomia Blackley  05/27/2012, 9:57  AM Pager: [phone] I have reviewed the above note, examined the patient and agree with plan of treatment. She is well known to me, many diverticular bleeds and finally diverticular stricture necessitated subtotal colectomy. I would like to do a brief flex.sigmoid to localize the bleeding. She agrees with the plan.  [facility] Pager # [phone]

## 2012-05-29 ENCOUNTER — Encounter (HOSPITAL_COMMUNITY): Payer: Self-pay | Admitting: Internal Medicine

## 2012-05-29 DIAGNOSIS — L98499 Non-pressure chronic ulcer of skin of other sites with unspecified severity: Secondary | ICD-10-CM | POA: Diagnosis present

## 2012-05-29 DIAGNOSIS — N189 Chronic kidney disease, unspecified: Secondary | ICD-10-CM | POA: Diagnosis present

## 2012-05-29 DIAGNOSIS — K648 Other hemorrhoids: Secondary | ICD-10-CM | POA: Diagnosis present

## 2012-05-29 LAB — BASIC METABOLIC PANEL
CO2: 25 mEq/L (ref 19–32)
Calcium: 8.2 mg/dL — ABNORMAL LOW (ref 8.4–10.5)
Glucose, Bld: 87 mg/dL (ref 70–99)
Sodium: 138 mEq/L (ref 135–145)

## 2012-05-29 LAB — CBC WITH DIFFERENTIAL/PLATELET
Eosinophils Absolute: 0.1 10*3/uL (ref 0.0–0.7)
Eosinophils Relative: 1 % (ref 0–5)
Lymphs Abs: 1.6 10*3/uL (ref 0.7–4.0)
MCH: 27.8 pg (ref 26.0–34.0)
MCV: 81.9 fL (ref 78.0–100.0)
Monocytes Absolute: 0.7 10*3/uL (ref 0.1–1.0)
Monocytes Relative: 8 % (ref 3–12)
Platelets: 155 10*3/uL (ref 150–400)
RBC: 3.31 MIL/uL — ABNORMAL LOW (ref 3.87–5.11)

## 2012-05-29 MED ORDER — HYDROCORTISONE ACETATE 25 MG RE SUPP: 25.0000 mg | Freq: Two times a day (BID) | RECTAL | Status: DC

## 2012-05-29 NOTE — Progress Notes (Signed)
     [facility] Daily Rounding Note 05/29/2012, 9:59 AM  SUBJECTIVE:       Slight rectal bleeding and discomfort today.  Otherwise feels well.  OBJECTIVE:         Vital signs in last 24 hours:    Temp:  [97.7 F (36.5 C)-99.6 F (37.6 C)] 99.6 F (37.6 C) (05/07 0300) Pulse Rate:  [64-66] 65 (05/07 0300) Resp:  [8-22] 18 (05/07 0300) BP: (119-147)/(57-71) 126/62 mmHg (05/07 0300) SpO2:  [98 %-100 %] 99 % (05/07 0300) Last BM Date: 05/28/12 General: aphasic, pleasant and alert   Heart: RRR.  No MRG Chest: clear.  No labored breathing Abdomen: soft, NT, ND.  No mass or HSM  Extremities: no CCE.  Neuro/Psych:  Pleasant with expressive aphasia.    Intake/Output from previous day: 05/06 0701 - 05/07 0700 In: 363 [P.O.:360; I.V.:3] Out: 553 [Urine:550; Stool:3]  Intake/Output this shift: Total I/O In: -  Out: 100 [Urine:100]  Lab Results:  Recent Labs  05/27/12 0723 05/28/12 0614 05/29/12 0624  WBC 3.8* 4.1 9.5  HGB 9.2* 8.9* 9.2*  HCT 27.1* 25.8* 27.1*  PLT 145* 152 155   BMET  Recent Labs  05/26/12 1535 05/28/12 0614 05/29/12 0624  NA 138 140 138  K 4.0 3.9 3.8  CL 104 108 107  CO2 26 24 25   GLUCOSE 151* 81 87  BUN 33* 25* 20  CREATININE 1.67* 1.40* 1.46*  CALCIUM 8.7 8.6 8.2*   LFT  Recent Labs  05/26/12 1535  PROT 6.3  ALBUMIN 3.2*  AST 20  ALT 11  ALKPHOS 53  BILITOT 0.2*   PT/INR  Recent Labs  05/26/12 1535  LABPROT 13.1  INR 1.00    ASSESMENT: *  S/p banding of large internal hemorrhoids 5/6.  Some minor rectal bleeding today.  On Anusol HC suppositories *  Rectal bleeding secondary to hemorrhoids. *  S/P subtotal colectomy for stricturing and bleeding diverticular disease.   * Chronic Plavix for hx multiple CVAs as well as hx of PE. On hold  * Stage 3 chronic kidney disease, GFR is 33. Contributes to anemia  * Iron deficiency anemia. On chronic oral iron supplements. Hgb is better today.   PLAN: *  Dr Juanda Chance would like  Plavix to restart in 2 weeks, that would be 06/11/12.   *  ROV with Dr Juanda Chance on 6/17.  *  Cleared for discharge from GI view.    LOS: 3 days   [name]  05/29/2012, 9:59 AM Pager: [phone]

## 2012-05-29 NOTE — Discharge Summary (Signed)
Internal Medicine Teaching [facility] Discharge Note  Name: [name] MRN: [id number] DOB: 05/26/1935 77 y.o.  Date of Admission: 05/26/2012  3:29 PM Date of Discharge: 05/29/2012 Attending Physician: Dr. Eben Burow   Discharge Diagnosis: 1. Lower GI/Rectal bleeding due to Internal hemmorhoids 2. chronic kidney disease  3. Hypertension  4. History of stroke  5. Chronic normocytic anemia  6. Left ankle stage 2 wound    Discharge Medications:   Medication List    STOP taking these medications       clopidogrel 75 MG tablet  Commonly known as:  PLAVIX      TAKE these medications       feeding supplement Liqd  Take 237 mLs by mouth 2 (two) times daily between meals.     hydrocortisone 25 MG suppository  Commonly known as:  ANUSOL-HC  Place 1 suppository (25 mg total) rectally 2 (two) times daily.     metoprolol tartrate 25 MG tablet  Commonly known as:  LOPRESSOR  Take 25 mg by mouth 2 (two) times daily.        Disposition and follow-up:   [name] was discharged from [facility] in stable condition condition.  At the hospital follow up visit please address  1) Repeat BMET, CBC 2) Resume Plavix 06/11/12 per Gastroenterology 3) Address left ankle ulceration present prior to [facility]  Follow-up Appointments: Follow-up Information   Follow up with [name], MD On 07/09/2012. (3:15 PM to follow up hemorrhoids.  feel  free to call office if continuing problems with rectal bleeding or can cancel appointment if doing well and no rectal bleeding. )    Contact information:   [address] [place] [address] [phone]      Discharge Orders   Future Appointments Provider Department Dept Phone   07/09/2012 3:15 PM Hart Carwin, MD [facility] [phone]   Future Orders Complete By Expires     Discharge instructions  As directed     Comments:      1)Please follow up with [facility] physician (Dr. Dorothe Pea) [phone]  2) Please follow up with Dr. Juanda Chance 07/09/12 at 3:15 PM  3)Stop Plavix for 2 weeks. Resume 06/11/12   4) Wound care to left ankle to be provided by [facility]    Increase activity slowly  As directed        Consultations: Treatment Team:  Hart Carwin, MD  Procedures Performed:  X-ray Chest Pa And Lateral   05/26/2012  *RADIOLOGY REPORT*  Clinical Data: Cough, congestion.  CHEST - 2 VIEW  Comparison: 03/23/2012  Findings: Heart and mediastinal contours are within normal limits. No focal opacities or effusions.  No acute bony abnormality.  IMPRESSION: No active cardiopulmonary disease.   Original Report Authenticated By: Charlett Nose, M.D.     2D Echo: none  Flexible Sigmoidoscopy Report 05/28/12:  Large internal hemmorhoids 1st grade with recent bleeding. Band ligation x 7. Hold Plavix until 06/11/12   Cardiac Cath: none  Admission HPI:  Chief Complaint: Bloody bowel movements    History of Present Illness: 77 year old woman with a history of CVA, diverticulosis, stage III CKD, adenomatous colonic polyps, and a subtotal colectomy in 2010; presenting to the emergency department after a bloody bowel movement. She had a bowel movement at 1100 this morning with gross blood in the colon bowl and blood in the stool. She had a second bloody bowel movement here in the emergency department. She is otherwise  without complaints. She denies dizziness, chest pain, dyspnea, abdominal pain, nausea, vomiting, and bleeding from other sites.  This patient has a long history of diverticulosis and diverticular hemorrhaging. She was offered a colectomy in 2005 but she refused. In 2010 she developed a diverticular abscess and underwent a total colectomy with anastomosis of the ileum and rectum. Since that time, she has had no further major bleeding although she does have slight bleeding from hemorrhoids from time to time.    Review of Systems:  Constitutional: Negative for fever, chills and malaise/fatigue.   HENT: Positive for congestion and sore throat (scratchy throat).  Eyes: Positive for blurred vision (chronic).  Respiratory: Positive for cough. Negative for sputum production and shortness of breath.  Cardiovascular: Negative for chest pain.  Gastrointestinal: Positive for blood in stool. Negative for nausea, vomiting and abdominal pain.  Genitourinary: Negative for hematuria.  Skin: Negative for rash.  Neurological: Negative for dizziness.  Endo/Heme/Allergies: Does not bruise/bleed easily.    Admission Physical exam:  VITALS: BP 163/83, HR 66, RR 16, temp 98.74F, SpO2 99% on room air  GENERAL: well developed, well nourished; no acute distress  HEAD: atraumatic, normocephalic  EYES: pupils equal, round and reactive; sclera anicteric; normal conjunctiva  EARS: canals patent and right TM scarred, left TM normal  NOSE/THROAT: oropharynx clear, moist mucous membranes, pink gums, normal dentition  NECK: supple, thyroid normal in size and without palpable nodules  LYMPH: no cervical or supraclavicular lymphadenopathy  LUNGS: clear to auscultation bilaterally, normal work of breathing  HEART: normal rate and regular rhythm; normal S1 and S2 without S3 or S4; no murmurs, rubs, or clicks  PULSES: radial 2+ and symmetric, pedal pulses diminished  ABDOMEN: soft; tenderness with palpation of the suprapubic area; no masses organomegaly; normal bowel sounds; no rigidity, guarding, or rebound tenderness  SKIN: warm, dry, intact, normal turgor, no rashes  EXTREMITIES: 1+ pitting edema in the ankles, no clubbing or cyanosis  PSYCH: patient is alert and oriented, mood and affect are normal and congruent, thought content is normal without delusions, thought process is linear, speech is slurred with stuttering behavior is normal     Hospital Course by problem list: 1. Lower GI/Rectal bleeding due to Internal hemmorhoids 2. chronic kidney disease  3. Hypertension  4. History of stroke  5. Chronic  normocytic anemia  6. Left ankle stage 2 wound   77 y.o PMH GI bleeding in 2010 with abdominal colectomy with ileorectal anastomosis for history of recurrent diverticulitis with associated stricture as well and recurrent diverticular bleeds. She presents with bright red blood per rectum, FOBT positive.   1. Lower GI/Rectal bleeding  Hemoglobin trended down from to 11.2 to 8.9.  Most likely secondary to internal hemorrhoids. She has internal and external hemorrhoids. Banding was done x 7 by [facility] gastroenterology (Dr. Juanda Chance).  She will be on anusol HC suppositories.  We are currently holding Plavix 75 mg until 06/11/12.  She has follow up with Dr. Juanda Chance 07/09/12 3:15 PM.    2. chronic kidney disease  Creatinine on admission was 1.67.  Baseline creatinine in the range of 1.4-1.8. Cr 1.46.   3. Hypertension  -Intermittently mildly elevated this admission.  We held Metoprolol tartrate 25 mg twice a day and resumed at discharge.   4. History of stroke  At home she takes clopidogrel 75 mg a day. She has residual right sided weakness and numbness and slurred speech and/or aphasia. We held clopidogrel for now until 06/11/12 per gastroenterology (Dr. Juanda Chance)  5. Chronic normocytic anemia  Baseline hemoglobin is in the range of 10-11. On day of discharge hemoglobin was 9.2 but dropped as low as 8.9 this admission.  Anemia panel indicates anemia of chronic disease.     6. Left ankle stage 2 wound  [facility] will follow up and treat the patient outpatient.    She was given sequential compression devices for DVT prophylaxis this admission due to lower GI bleeding.    Discharge Vitals:  BP 126/62  Pulse 65  Temp(Src) 99.6 F (37.6 C) (Oral)  Resp 18  Ht 5\' 6"  (1.676 m)  Wt 120 lb 5.9 oz (54.6 kg)  BMI 19.44 kg/m2  SpO2 99%  Discharge physical exam:  General: resting in bed, NAD, arousable and oriented x 3  HEENT: [place]/at, no scleral icterus  Cardiac: RRR, no rubs, murmurs or gallops  Pulm: clear  to auscultation bilaterally, no wheezes, rales, or rhonchi  Abd: soft, nontender, nondistended, BS present  Ext: warm and well perfused, no significant edema noted  Neuro: arousable and oriented X3, grossly neurologically intact, speech impaired/aphasia post stroke  Skin: left ankle with 0.5 cm ulceration stage 2 at least (patient had prior to admission) with yellow exudate   Discharge Labs:  Results for [name], [name] (MRN [id number]) as of 05/29/2012 14:08  Ref. Range 05/26/2012 15:35 05/26/2012 20:37 05/26/2012 20:38 05/28/2012 06:14 05/29/2012 06:24  Sodium Latest Range: 135-145 mEq/L 138   140 138  Potassium Latest Range: 3.5-5.1 mEq/L 4.0   3.9 3.8  Chloride Latest Range: 96-112 mEq/L 104   108 107  CO2 Latest Range: 19-32 mEq/L 26   24 25   BUN Latest Range: 6-23 mg/dL 33 (H)   25 (H) 20  Creatinine Latest Range: 0.50-1.10 mg/dL 4.09 (H)   8.11 (H) 9.14 (H)  Calcium Latest Range: 8.4-10.5 mg/dL 8.7   8.6 8.2 (L)  GFR calc non Af Amer Latest Range: >90 mL/min 29 (L)   36 (L) 34 (L)  GFR calc Af Amer Latest Range: >90 mL/min 33 (L)   41 (L) 39 (L)  Glucose Latest Range: 70-99 mg/dL 782 (H)   81 87  Phosphorus Latest Range: 2.3-4.6 mg/dL   2.6    Magnesium Latest Range: 1.5-2.5 mg/dL   2.1    Alkaline Phosphatase Latest Range: 39-117 U/L 53      Albumin Latest Range: 3.5-5.2 g/dL 3.2 (L)      AST Latest Range: 0-37 U/L 20      ALT Latest Range: 0-35 U/L 11      Total Protein Latest Range: 6.0-8.3 g/dL 6.3      Total Bilirubin Latest Range: 0.3-1.2 mg/dL 0.2 (L)      Iron Latest Range: 42-135 ug/dL    57   UIBC Latest Range: 125-400 ug/dL    956   TIBC Latest Range: 250-470 ug/dL    213 (L)   Saturation Ratios Latest Range: 20-55 %    29   Ferritin Latest Range: 10-291 ng/mL    558 (H)   Folate No range found    12.7   Lactic Acid, Venous Latest Range: 0.5-2.2 mmol/L  1.7     Vitamin B-12 Latest Range: 211-911 pg/mL    315    Results for [name], [name] (MRN [id number]) as of 05/29/2012  14:08  Ref. Range 05/26/2012 15:35 05/26/2012 20:38 05/26/2012 23:26 05/27/2012 07:23 05/28/2012 06:14 05/29/2012 06:24  WBC Latest Range: 4.0-10.5 K/uL 4.8 4.9 5.4 3.8 (L) 4.1 9.5  RBC Latest Range: 3.87-5.11  MIL/uL 4.00 3.99 3.59 (L) 3.27 (L) 3.15 (L) 3.31 (L)  Hemoglobin Latest Range: 12.0-15.0 g/dL 16.1 (L) 09.6 (L) 04.5 (L) 9.2 (L) 8.9 (L) 9.2 (L)  HCT Latest Range: 36.0-46.0 % 33.4 (L) 33.7 (L) 30.0 (L) 27.1 (L) 25.8 (L) 27.1 (L)  MCV Latest Range: 78.0-100.0 fL 83.5 84.5 83.6 82.9 81.9 81.9  MCH Latest Range: 26.0-34.0 pg 28.0 27.6 27.9 28.1 28.3 27.8  MCHC Latest Range: 30.0-36.0 g/dL 40.9 81.1 91.4 78.2 95.6 33.9  RDW Latest Range: 11.5-15.5 % 14.3 14.4 14.4 14.3 14.5 14.6  Platelets Latest Range: 150-400 K/uL 165 165 158 145 (L) 152 155  Neutrophils Relative Latest Range: 43-77 % 57     75  Lymphocytes Relative Latest Range: 12-46 % 36     17  Monocytes Relative Latest Range: 3-12 % 6     8  Eosinophils Relative Latest Range: 0-5 % 1     1  Basophils Relative Latest Range: 0-1 % 0     0  NEUT# Latest Range: 1.7-7.7 K/uL 2.7     7.1  Lymphocytes Absolute Latest Range: 0.7-4.0 K/uL 1.7     1.6  Monocytes Absolute Latest Range: 0.1-1.0 K/uL 0.3     0.7  Eosinophils Absolute Latest Range: 0.0-0.7 K/uL 0.0     0.1  Basophils Absolute Latest Range: 0.0-0.1 K/uL 0.0     0.0  RBC. Latest Range: 3.87-5.11 MIL/uL     3.15 (L)   Retic Ct Pct Latest Range: 0.4-3.1 %     1.1   Retic Count, Manual Latest Range: 19.0-186.0 K/uL     34.7    Results for [name], [name] (MRN [id number]) as of 05/29/2012 14:08  Ref. Range 05/26/2012 15:35 05/26/2012 20:38  Prothrombin Time Latest Range: 11.6-15.2 seconds 13.1   INR Latest Range: 0.00-1.49  1.00   APTT Latest Range: 24-37 seconds  22 (L)   Results for [name], [name] (MRN [id number]) as of 05/29/2012 14:08  Ref. Range 05/27/2012 14:16  Color, Urine Latest Range: YELLOW  YELLOW  APPearance Latest Range: CLEAR  HAZY (A)  Specific Gravity, Urine Latest Range:  1.005-1.030  1.017  pH Latest Range: 5.0-8.0  5.0  Glucose Latest Range: NEGATIVE mg/dL NEGATIVE  Bilirubin Urine Latest Range: NEGATIVE  NEGATIVE  Ketones, ur Latest Range: NEGATIVE mg/dL NEGATIVE  Protein Latest Range: NEGATIVE mg/dL NEGATIVE  Urobilinogen, UA Latest Range: 0.0-1.0 mg/dL 0.2  Nitrite Latest Range: NEGATIVE  NEGATIVE  Leukocytes, UA Latest Range: NEGATIVE  MODERATE (A)  Hgb urine dipstick Latest Range: NEGATIVE  SMALL (A)  Urine-Other No range found RARE YEAST  WBC, UA Latest Range: <3 WBC/hpf 3-6  RBC / HPF Latest Range: <3 RBC/hpf 0-2  Squamous Epithelial / LPF Latest Range: RARE  MANY (A)  Bacteria, UA Latest Range: RARE  FEW (A)   Results for [name], [name] (MRN [id number]) as of 05/29/2012 14:08  Ref. Range 05/26/2012 23:19  MRSA by PCR Latest Range: NEGATIVE  NEGATIVE   Results for [name], [name] (MRN [id number]) as of 05/29/2012 14:08  Ref. Range 05/26/2012 15:55  Fecal Occult Blood, POC Latest Range: NEGATIVE  POSITIVE (A)   Results for [name], [name] (MRN [id number]) as of 05/29/2012 14:08  Ref. Range 05/28/2012 06:14  Iron Latest Range: 42-135 ug/dL 57  UIBC Latest Range: 125-400 ug/dL 638  TIBC Latest Range: 250-470 ug/dL 756 (L)  Saturation Ratios Latest Range: 20-55 % 29  Ferritin Latest Range: 10-291 ng/mL 558 (H)  Folate No range found  12.7   Results for [name], [name] (MRN [id number]) as of 05/29/2012 14:08  Ref. Range 05/28/2012 06:14  Vitamin B-12 Latest Range: 211-911 pg/mL 315   Results for orders placed during the hospital encounter of 05/26/12 (from the past 24 hour(s))  BASIC METABOLIC PANEL     Status: Abnormal   Collection Time    05/29/12  6:24 AM      Result Value Range   Sodium 138  135 - 145 mEq/L   Potassium 3.8  3.5 - 5.1 mEq/L   Chloride 107  96 - 112 mEq/L   CO2 25  19 - 32 mEq/L   Glucose, Bld 87  70 - 99 mg/dL   BUN 20  6 - 23 mg/dL   Creatinine, Ser 4.09 (*) 0.50 - 1.10 mg/dL   Calcium 8.2 (*) 8.4 - 10.5 mg/dL   GFR calc  non Af Amer 34 (*) >90 mL/min   GFR calc Af Amer 39 (*) >90 mL/min  CBC WITH DIFFERENTIAL     Status: Abnormal   Collection Time    05/29/12  6:24 AM      Result Value Range   WBC 9.5  4.0 - 10.5 K/uL   RBC 3.31 (*) 3.87 - 5.11 MIL/uL   Hemoglobin 9.2 (*) 12.0 - 15.0 g/dL   HCT 81.1 (*) 91.4 - 78.2 %   MCV 81.9  78.0 - 100.0 fL   MCH 27.8  26.0 - 34.0 pg   MCHC 33.9  30.0 - 36.0 g/dL   RDW 95.6  21.3 - 08.6 %   Platelets 155  150 - 400 K/uL   Neutrophils Relative 75  43 - 77 %   Neutro Abs 7.1  1.7 - 7.7 K/uL   Lymphocytes Relative 17  12 - 46 %   Lymphs Abs 1.6  0.7 - 4.0 K/uL   Monocytes Relative 8  3 - 12 %   Monocytes Absolute 0.7  0.1 - 1.0 K/uL   Eosinophils Relative 1  0 - 5 %   Eosinophils Absolute 0.1  0.0 - 0.7 K/uL   Basophils Relative 0  0 - 1 %   Basophils Absolute 0.0  0.0 - 0.1 K/uL    Signed: Annett Gula 05/29/2012, 2:46 PM   Time Spent on Discharge: > 30 minutes  Services Ordered on Discharge: none Equipment Ordered on Discharge: none

## 2012-05-29 NOTE — Progress Notes (Signed)
NURSING PROGRESS NOTE  [name] [id number] Discharge Data: 05/29/2012 1:45 PM Attending Provider: Lars Mage, MD DGU:[id number],VQQVZ D, MD     Melrose Nakayama to be D/C'd Home per MD order.  Discussed with the patient the After Visit Summary and all questions fully answered. All IV's discontinued with no bleeding noted. All belongings returned to patient for patient to take home.   Last Vital Signs:  Blood pressure 126/62, pulse 65, temperature 99.6 F (37.6 C), temperature source Oral, resp. rate 18, height 5\' 6"  (1.676 m), weight 54.6 kg (120 lb 5.9 oz), SpO2 99.00%.  Discharge Medication List   Medication List    STOP taking these medications       clopidogrel 75 MG tablet  Commonly known as:  PLAVIX      TAKE these medications       feeding supplement Liqd  Take 237 mLs by mouth 2 (two) times daily between meals.     hydrocortisone 25 MG suppository  Commonly known as:  ANUSOL-HC  Place 1 suppository (25 mg total) rectally 2 (two) times daily.     metoprolol tartrate 25 MG tablet  Commonly known as:  LOPRESSOR  Take 25 mg by mouth 2 (two) times daily.

## 2012-05-29 NOTE — Progress Notes (Signed)
Subjective: Denies bloody bowel movements tolerating diet.  Mentions would to left ankle she has had prior to admission and she wants an ointment  Objective: Vital signs in last 24 hours: Filed Vitals:   05/28/12 1240 05/28/12 1454 05/28/12 2123 05/29/12 0300  BP: 135/57 119/68 147/59 126/62  Pulse:  66 64 65  Temp:  98.7 F (37.1 C) 98.3 F (36.8 C) 99.6 F (37.6 C)  TempSrc:  Oral Oral Oral  Resp: 17 18 20 18   Height:      Weight:      SpO2: 100% 100% 98% 99%   Weight change:   Intake/Output Summary (Last 24 hours) at 05/29/12 1046 Last data filed at 05/29/12 0955  Gross per 24 hour  Intake    603 ml  Output    453 ml  Net    150 ml   Vitals reviewed. General: resting in bed, NAD, arousable and oriented x 3  HEENT: [place]/at, no scleral icterus Cardiac: RRR, no rubs, murmurs or gallops Pulm: clear to auscultation bilaterally, no wheezes, rales, or rhonchi Abd: soft, nontender, nondistended, BS present Ext: warm and well perfused, no significant edema noted  Neuro: arousable and oriented X3, grossly neurologically intact, speech impaired/aphasia post stroke  Skin: left ankle with 0.5 cm ulceration stage 2 at least (patient had prior to admission) with yellow exudate   Lab Results: Basic Metabolic Panel:  Recent Labs Lab 05/26/12 1535 05/26/12 2038 05/28/12 0614 05/29/12 0624  NA 138  --  140 138  K 4.0  --  3.9 3.8  CL 104  --  108 107  CO2 26  --  24 25  GLUCOSE 151*  --  81 87  BUN 33*  --  25* 20  CREATININE 1.67*  --  1.40* 1.46*  CALCIUM 8.7  --  8.6 8.2*  MG  --  2.1  --   --   PHOS  --  2.6  --   --    Liver Function Tests:  Recent Labs Lab 05/26/12 1535  AST 20  ALT 11  ALKPHOS 53  BILITOT 0.2*  PROT 6.3  ALBUMIN 3.2*   CBC:  Recent Labs Lab 05/26/12 1535  05/28/12 0614 05/29/12 0624  WBC 4.8  < > 4.1 9.5  NEUTROABS 2.7  --   --  7.1  HGB 11.2*  < > 8.9* 9.2*  HCT 33.4*  < > 25.8* 27.1*  MCV 83.5  < > 81.9 81.9  PLT 165  < > 152  155  < > = values in this interval not displayed. Coagulation:  Recent Labs Lab 05/26/12 1535  LABPROT 13.1  INR 1.00   Misc. Labs: None   Micro Results: Recent Results (from the past 240 hour(s))  MRSA PCR SCREENING     Status: None   Collection Time    05/26/12 11:19 PM      Result Value Range Status   MRSA by PCR NEGATIVE  NEGATIVE Final   Comment:            The GeneXpert MRSA Assay (FDA     approved for NASAL specimens     only), is one component of a     comprehensive MRSA colonization     surveillance program. It is not     intended to diagnose MRSA     infection nor to guide or     monitor treatment for     MRSA infections.     Studies/Results: No results  found. Medications:  Scheduled Meds: . antiseptic oral rinse  15 mL Mouth Rinse BID  . feeding supplement  237 mL Oral BID BM  . hydrocortisone  25 mg Rectal BID  . metoprolol tartrate  25 mg Oral BID  . sodium chloride  3 mL Intravenous Q12H   Continuous Infusions:   PRN Meds:.sodium chloride, acetaminophen, acetaminophen, albuterol, guaiFENesin-dextromethorphan, ondansetron (ZOFRAN) IV, ondansetron, sodium chloride Assessment/Plan: 77 y.o PMH GI bleeding in 2010 with abdominal colectomy with ileorectal anastomosis for history of recurrent diverticulitis with associated stricture as well and recurrent diverticular bleeds.  She presents with BRBPM, FOBT positive.   1. Lower GI/Rectal bleeding -Most likely secondary to hemorrhoids. She has internal and external hemorrhoids. Banding was done x 7 -[place]  gastroenterology (Dr. Juanda Chance) has been consulted and banded 7 hemmorhoids    -anusol HC suppositories  -Currently holding Plavix until 06/11/12 -f/u with Dr. Juanda Chance 07/09/12 3:15 PM   2. chronic kidney disease -Baseline creatinine in the range of 1.4-1.8. Cr 1.46  3. Hypertension -monitor VS. BP 126/62 -Holding metoprolol tartrate 25 mg twice a day.  If  Elevated resume.  Will likely resume at d/c     4. History of stroke -At home, she takes clopidogrel 75 mg a day. Has residual right sided weakness and numbness and slurred speech and/or aphasia.  -hold clopidogrel for now until 5/20 to resume   5. Chronic normocytic anemia -Baseline hemoglobin is in the range of 10-11. This am Hbg 9.2 -anemia panel indicates anemia of chronic disease  6. Left ankle stage 2 wound  -wound care RN consulted stat but canceled because [facility] .  7. DVT Prophylaxis -SCDs   8. F/E/N -NSL -will monitor BMET  -low fiber diet    Disposition: d/c today after wound consult   Dispo: Disposition is deferred at this time, awaiting improvement of current medical problems.  Anticipated discharge in approximately 1 day(s).   The patient does have a current PCP (REESE,BETTI D, MD)/[facility] physician, therefore will not be requiring OPC follow-up after discharge.   The patient does not have transportation limitations that hinder transportation to clinic appointments.  .Services Needed at time of discharge: Y = Yes, Blank = No PT:   OT:   RN:   Equipment:   Other:     LOS: 3 days   [name] [phone] 05/29/2012, 10:46 AM

## 2012-05-30 ENCOUNTER — Encounter: Payer: Self-pay | Admitting: *Deleted

## 2012-07-09 ENCOUNTER — Ambulatory Visit (INDEPENDENT_AMBULATORY_CARE_PROVIDER_SITE_OTHER): Payer: Medicare Other | Admitting: Internal Medicine

## 2012-07-09 ENCOUNTER — Telehealth: Payer: Self-pay | Admitting: Internal Medicine

## 2012-07-09 ENCOUNTER — Encounter: Payer: Self-pay | Admitting: Internal Medicine

## 2012-07-09 VITALS — BP 110/70 | HR 68 | Ht 66.0 in | Wt 115.4 lb

## 2012-07-09 DIAGNOSIS — K648 Other hemorrhoids: Secondary | ICD-10-CM

## 2012-07-09 MED ORDER — MAGIC MOUTHWASH: 5.0000 mL | Freq: Three times a day (TID) | ORAL | Status: DC | PRN

## 2012-07-09 NOTE — Patient Instructions (Addendum)
We have sent the following medications to your pharmacy for you to pick up at your convenience: Magic mouthwash.    Dr Lester Bliss

## 2012-07-09 NOTE — Progress Notes (Signed)
[name] 1935/04/07 MRN [id number]        History of Present Illness:  This is a 77 year old African American female for weeks post hemorrhoidal band ligation while hospitalized for rectal bleeding. She has a history of well subtotal colectomy for recurrent diverticular bleeds in September 2010. Lexapro sigmoidoscopy on 05/28/2012 showed widely patent anastomosis at 20 cm. Several hemorrhoids were banded. She is doing well denying rectal bleeding or rectal pain. He was just discharged with hemoglobin of 9.2 she is back on Plavix. Her complaint today is so bad stays in her mouth. She has upper dentures. Her daughter who comes with her says that she is depressed. Her medications include Ensure, Lopressor 25 mg twice a day   Past Medical History  Diagnosis Date  . Hypertension   . Stroke May 2009    multiple, last one Nov 2012, 2 strokes with right sided deficits and slurred speech  . Arthritis     hands  . Depression   . Asthma   . Hypochromic anemia   . Hyperlipidemia   . Hx of adenomatous colonic polyps   . Diverticulosis   . Chronic kidney disease (CKD), stage III (moderate)   . Internal hemorrhoids    Past Surgical History  Procedure Laterality Date  . Vascular surgery    . Abdominal hysterectomy    . Other surgical history      2 ear surgeries  . Tonsillectomy    . Abdominal surgery    . Colectomy    . Subtotal colectomy  2010    for stricturing from bouts of diverticulitis along with hx recurrent diverticular bleeds.  the surgery was a total abdominal colec  . Flexible sigmoidoscopy N/A 05/28/2012    Procedure: FLEXIBLE SIGMOIDOSCOPY;  Surgeon: Hart Carwin, MD;  Location: [facility] ENDOSCOPY;  Service: Endoscopy;  Laterality: N/A;    reports that she has quit smoking. She has never used smokeless tobacco. She reports that she does not drink alcohol or use illicit drugs. family history includes Cancer in her sister; Depression in her father; Diabetes in her father and  paternal aunt; HIV in her brother; Heart disease in her father; Prostate cancer in an unspecified family member; and Stroke in her father.  There is no history of Colon cancer. Allergies  Allergen Reactions  . Aspirin Nausea And Vomiting    Hurts stomach        Review of Systems: Denies dysphagia nausea vomiting  The remainder of the 10 point ROS is negative except as outlined in H&P   Physical Exam: General appearance  Well developed, in no distress. Thin Eyes- non icteric. HEENT nontraumatic, normocephalic. Mouth no lesions, tongue papillated, no cheilosis. Neck supple without adenopathy, thyroid not enlarged, no carotid bruits, no JVD. Lungs Clear to auscultation bilaterally. Cor normal S1, normal S2, regular rhythm, no murmur,  quiet precordium. Abdomen: Soft nontender with normal active bowel sounds. Well-healed surgical scar Rectal: And anoscopic exam reveals normal perianal area. Normal rectal sphincter tone. The banded hemorrhoids. No prolapse bleeding or irritation Extremities no pedal edema. Skin no lesions. Neurological alert and oriented x 3. Psychological normal mood and affect.  Assessment and Plan:  77 year old [place] American female for weeks post hemorrhoidal band ligation which was successful. Patient remains asymptomatic. He may discontinue her Anusol-HC suppositories Patient complaints of bad taste in her mouth but her oral exam is normal. We will start magic mouthwash 5 cc 3 times a day    07/09/2012 [name]

## 2012-07-09 NOTE — Telephone Encounter (Signed)
[facility] wants to verify the quantity because they usually give 8 oz bottle instead of 15 mL's. I told pharmacist with our electronic system the only option we had was 15mL as our largest bottle. Told pharmacist to do the 8 oz bottle so she can have more than enough to swish and swallow three times a day.

## 2012-10-22 ENCOUNTER — Ambulatory Visit: Payer: Medicare Other | Admitting: Internal Medicine

## 2013-02-10 ENCOUNTER — Encounter (HOSPITAL_COMMUNITY): Payer: Self-pay | Admitting: Emergency Medicine

## 2013-02-10 ENCOUNTER — Emergency Department (HOSPITAL_COMMUNITY): Payer: Medicare (Managed Care)

## 2013-02-10 ENCOUNTER — Emergency Department (HOSPITAL_COMMUNITY)
Admission: EM | Admit: 2013-02-10 | Discharge: 2013-02-10 | Disposition: A | Discharge status: Home / Self Care | Payer: Medicare (Managed Care) | Attending: Emergency Medicine | Admitting: Emergency Medicine

## 2013-02-10 DIAGNOSIS — N183 Chronic kidney disease, stage 3 unspecified: Secondary | ICD-10-CM | POA: Insufficient documentation

## 2013-02-10 DIAGNOSIS — R55 Syncope and collapse: Secondary | ICD-10-CM | POA: Insufficient documentation

## 2013-02-10 DIAGNOSIS — R5381 Other malaise: Secondary | ICD-10-CM | POA: Insufficient documentation

## 2013-02-10 DIAGNOSIS — F3289 Other specified depressive episodes: Secondary | ICD-10-CM | POA: Insufficient documentation

## 2013-02-10 DIAGNOSIS — Z8673 Personal history of transient ischemic attack (TIA), and cerebral infarction without residual deficits: Secondary | ICD-10-CM | POA: Insufficient documentation

## 2013-02-10 DIAGNOSIS — Z860100 Personal history of colon polyps, unspecified: Secondary | ICD-10-CM | POA: Insufficient documentation

## 2013-02-10 DIAGNOSIS — Z79899 Other long term (current) drug therapy: Secondary | ICD-10-CM | POA: Insufficient documentation

## 2013-02-10 DIAGNOSIS — I129 Hypertensive chronic kidney disease with stage 1 through stage 4 chronic kidney disease, or unspecified chronic kidney disease: Secondary | ICD-10-CM | POA: Insufficient documentation

## 2013-02-10 DIAGNOSIS — K644 Residual hemorrhoidal skin tags: Secondary | ICD-10-CM | POA: Insufficient documentation

## 2013-02-10 DIAGNOSIS — F329 Major depressive disorder, single episode, unspecified: Secondary | ICD-10-CM | POA: Insufficient documentation

## 2013-02-10 DIAGNOSIS — E785 Hyperlipidemia, unspecified: Secondary | ICD-10-CM | POA: Insufficient documentation

## 2013-02-10 DIAGNOSIS — Z9889 Other specified postprocedural states: Secondary | ICD-10-CM | POA: Insufficient documentation

## 2013-02-10 DIAGNOSIS — J45909 Unspecified asthma, uncomplicated: Secondary | ICD-10-CM | POA: Insufficient documentation

## 2013-02-10 DIAGNOSIS — Z862 Personal history of diseases of the blood and blood-forming organs and certain disorders involving the immune mechanism: Secondary | ICD-10-CM | POA: Insufficient documentation

## 2013-02-10 DIAGNOSIS — Z87891 Personal history of nicotine dependence: Secondary | ICD-10-CM | POA: Insufficient documentation

## 2013-02-10 DIAGNOSIS — K5900 Constipation, unspecified: Secondary | ICD-10-CM | POA: Insufficient documentation

## 2013-02-10 DIAGNOSIS — Z8739 Personal history of other diseases of the musculoskeletal system and connective tissue: Secondary | ICD-10-CM | POA: Insufficient documentation

## 2013-02-10 DIAGNOSIS — R5383 Other fatigue: Secondary | ICD-10-CM

## 2013-02-10 DIAGNOSIS — Z7902 Long term (current) use of antithrombotics/antiplatelets: Secondary | ICD-10-CM | POA: Insufficient documentation

## 2013-02-10 LAB — CBC
HEMATOCRIT: 33 % — AB (ref 36.0–46.0)
HEMOGLOBIN: 11 g/dL — AB (ref 12.0–15.0)
MCH: 28.7 pg (ref 26.0–34.0)
MCHC: 33.3 g/dL (ref 30.0–36.0)
MCV: 86.2 fL (ref 78.0–100.0)
Platelets: 166 10*3/uL (ref 150–400)
RBC: 3.83 MIL/uL — AB (ref 3.87–5.11)
RDW: 13.1 % (ref 11.5–15.5)
WBC: 4.4 10*3/uL (ref 4.0–10.5)

## 2013-02-10 LAB — BASIC METABOLIC PANEL
BUN: 26 mg/dL — AB (ref 6–23)
CALCIUM: 9.4 mg/dL (ref 8.4–10.5)
CO2: 27 meq/L (ref 19–32)
Chloride: 100 mEq/L (ref 96–112)
Creatinine, Ser: 1.72 mg/dL — ABNORMAL HIGH (ref 0.50–1.10)
GFR calc Af Amer: 32 mL/min — ABNORMAL LOW (ref >90)
GFR, EST NON AFRICAN AMERICAN: 28 mL/min — AB (ref >90)
GLUCOSE: 118 mg/dL — AB (ref 70–99)
Potassium: 3.6 mEq/L — ABNORMAL LOW (ref 3.7–5.3)
SODIUM: 137 meq/L (ref 137–147)

## 2013-02-10 LAB — POCT I-STAT TROPONIN I: Troponin i, poc: 0.01 ng/mL (ref 0.00–0.08)

## 2013-02-10 MED ORDER — SODIUM CHLORIDE 0.9 % IV SOLN
Freq: Once | INTRAVENOUS | Status: DC
Start: 2013-02-10 — End: 2013-02-11

## 2013-02-10 MED ORDER — MAGNESIUM HYDROXIDE 400 MG/5ML PO SUSP: 15.0000 mL | Freq: Every day | ORAL | Status: DC | PRN

## 2013-02-10 NOTE — ED Notes (Signed)
PA at bedside.

## 2013-02-10 NOTE — ED Notes (Signed)
Patient alert and oriented x 4 Patient denies c/o feeling lightheaded or dizzy Patient states that she is here for a "suppository" due to c/o constipation

## 2013-02-10 NOTE — Discharge Instructions (Signed)
Call for a follow up appointment with a Family or Primary Care Provider.  Return if Symptoms worsen.   Take medication as prescribed.   Constipation, Adult Constipation is when a person has fewer than 3 bowel movements a week; has difficulty having a bowel movement; or has stools that are dry, hard, or larger than normal. As people grow older, constipation is more common. If you try to fix constipation with medicines that make you have a bowel movement (laxatives), the problem may get worse. Long-term laxative use may cause the muscles of the colon to become weak. A low-fiber diet, not taking in enough fluids, and taking certain medicines may make constipation worse. CAUSES   Certain medicines, such as antidepressants, pain medicine, iron supplements, antacids, and water pills.   Certain diseases, such as diabetes, irritable bowel syndrome (IBS), thyroid disease, or depression.   Not drinking enough water.   Not eating enough fiber-rich foods.   Stress or travel.  Lack of physical activity or exercise.  Not going to the restroom when there is the urge to have a bowel movement.  Ignoring the urge to have a bowel movement.  Using laxatives too much. SYMPTOMS   Having fewer than 3 bowel movements a week.   Straining to have a bowel movement.   Having hard, dry, or larger than normal stools.   Feeling full or bloated.   Pain in the lower abdomen.  Not feeling relief after having a bowel movement. DIAGNOSIS  Your caregiver will take a medical history and perform a physical exam. Further testing may be done for severe constipation. Some tests may include:   A barium enema X-ray to examine your rectum, colon, and sometimes, your small intestine.  A sigmoidoscopy to examine your lower colon.  A colonoscopy to examine your entire colon. TREATMENT  Treatment will depend on the severity of your constipation and what is causing it. Some dietary treatments include drinking  more fluids and eating more fiber-rich foods. Lifestyle treatments may include regular exercise. If these diet and lifestyle recommendations do not help, your caregiver may recommend taking over-the-counter laxative medicines to help you have bowel movements. Prescription medicines may be prescribed if over-the-counter medicines do not work.  HOME CARE INSTRUCTIONS   Increase dietary fiber in your diet, such as fruits, vegetables, whole grains, and beans. Limit high-fat and processed sugars in your diet, such as Jamaica[place] fries, hamburgers, cookies, candies, and soda.   A fiber supplement may be added to your diet if you cannot get enough fiber from foods.   Drink enough fluids to keep your urine clear or pale yellow.   Exercise regularly or as directed by your caregiver.   Go to the restroom when you have the urge to go. Do not hold it.  Only take medicines as directed by your caregiver. Do not take other medicines for constipation without talking to your caregiver first. SEEK IMMEDIATE MEDICAL CARE IF:   You have bright red blood in your stool.   Your constipation lasts for more than 4 days or gets worse.   You have abdominal or rectal pain.   You have thin, pencil-like stools.  You have unexplained weight loss. MAKE SURE YOU:   Understand these instructions.  Will watch your condition.  Will get help right away if you are not doing well or get worse. Document Released: 10/08/2003 Document Revised: 04/03/2011 Document Reviewed: 10/21/2012 [facility] Patient Information 2014 [place], [place].

## 2013-02-10 NOTE — ED Notes (Signed)
Bed: RESB Expected date: 02/10/13 Expected time: 7:32 PM Means of arrival: Ambulance Comments: Syncopal episode

## 2013-02-10 NOTE — ED Notes (Signed)
Patient arrives via EMS from home Patient had a BM, stood up, became lightheaded and dizzy NO LOC Patient ambulated to her couch and called EMS due to c/o dizziness EMS states that no change in VS or c/o dizziness while performing orthostatics Patient with hx of stroke with right side weakness, slurred speech as residual CBG 115 via EMS

## 2013-02-10 NOTE — ED Notes (Signed)
MD at bedside. 

## 2013-02-10 NOTE — ED Provider Notes (Signed)
CSN: [id number]     Arrival date & time 02/10/13  1946 History   First MD Initiated Contact with Patient 02/10/13 1953     Chief Complaint  Patient presents with  . Near Syncope   (Consider location/radiation/quality/duration/timing/severity/associated sxs/prior Treatment) HPI Comments: [name] is a 78 year-old female with a past medical history of HTN, CVA, GI bleed, presenting the Emergency Department with a chief complaint of near syncope. The patient reports she had just got up to go to the rest room, while ambulating she felt light headed, off balance.  She reports decrease in vision and had to hold onto the wall to keep from falling.  She reports BM today with blood on the toilet paper.    The history is provided by the patient and medical records. No language interpreter was used.    Past Medical History  Diagnosis Date  . Hypertension   . Stroke May 2009    multiple, last one Nov 2012, 2 strokes with right sided deficits and slurred speech  . Arthritis     hands  . Depression   . Asthma   . Hypochromic anemia   . Hyperlipidemia   . Hx of adenomatous colonic polyps   . Diverticulosis   . Chronic kidney disease (CKD), stage III (moderate)   . Internal hemorrhoids    Past Surgical History  Procedure Laterality Date  . Vascular surgery    . Abdominal hysterectomy    . Other surgical history      2 ear surgeries  . Tonsillectomy    . Abdominal surgery    . Colectomy    . Subtotal colectomy  2010    for stricturing from bouts of diverticulitis along with hx recurrent diverticular bleeds.  the surgery was a total abdominal colec  . Flexible sigmoidoscopy N/A 05/28/2012    Procedure: FLEXIBLE SIGMOIDOSCOPY;  Surgeon: Hart Carwin, MD;  Location: [facility] ENDOSCOPY;  Service: Endoscopy;  Laterality: N/A;   Family History  Problem Relation Age of Onset  . Diabetes Paternal Aunt   . Colon cancer Neg Hx   . Stroke Father   . HIV Brother   . Heart disease Father   .  Diabetes Father   . Prostate cancer    . Depression Father   . Cancer Sister     unknown type   History  Substance Use Topics  . Smoking status: Former Games developer  . Smokeless tobacco: Never Used  . Alcohol Use: No   OB History   Grav Para Term Preterm Abortions TAB SAB Ect Mult Living                 Review of Systems  Allergies  Aspirin  Home Medications   Current Outpatient Rx  Name  Route  Sig  Dispense  Refill  . atorvastatin (LIPITOR) 40 MG tablet   Oral   Take 40 mg by mouth daily.         . cholecalciferol (VITAMIN D) 1000 UNITS tablet   Oral   Take 1,000 Units by mouth daily.         . clopidogrel (PLAVIX) 75 MG tablet   Oral   Take 75 mg by mouth daily.         Marland Kitchen losartan-hydrochlorothiazide (HYZAAR) 50-12.5 MG per tablet   Oral   Take 1 tablet by mouth daily.         . traZODone (DESYREL) 50 MG tablet   Oral   Take 50  mg by mouth at bedtime.         . magnesium hydroxide (MILK OF MAGNESIA) 400 MG/5ML suspension   Oral   Take 15 mLs by mouth daily as needed for mild constipation.   360 mL   0    BP 126/61  Pulse 66  Temp(Src) 98 F (36.7 C) (Oral)  Resp 16  SpO2 100% Physical Exam  Nursing note and vitals reviewed. Constitutional: She is oriented to person, place, and time. She appears well-developed and well-nourished. She appears lethargic.  Elderly female  HENT:  Head: Normocephalic and atraumatic.  Mouth/Throat: Oropharynx is clear and moist. No oropharyngeal exudate.  Eyes: EOM are normal. Pupils are equal, round, and reactive to light.  Neck: Neck supple.  Cardiovascular: Normal rate and regular rhythm.   Pulses:      Radial pulses are 2+ on the right side, and 2+ on the left side.  No lower extremity edema  Pulmonary/Chest: Effort normal and breath sounds normal. She has no decreased breath sounds. She has no wheezes. She has no rhonchi. She has no rales.  Abdominal: Soft. Normal appearance. There is tenderness. There is  no rigidity, no rebound, no guarding, no tenderness at McBurney's point and negative Murphy's sign.    Mild tenderness to deep palpation.  Well healed linear midline scar, no signs of infection.  Genitourinary: Rectal exam shows external hemorrhoid. Rectal exam shows no tenderness. Guaiac negative stool.  No stool in rectal vault. No obvious blood on DRE.  Chaperone present.  Musculoskeletal: Normal range of motion.  Neurological: She is oriented to person, place, and time. She appears lethargic. She is not disoriented. No cranial nerve deficit or sensory deficit. She exhibits normal muscle tone. Coordination normal. GCS eye subscore is 4. GCS verbal subscore is 5. GCS motor subscore is 6.  Reflex Scores:      Patellar reflexes are 2+ on the right side and 1+ on the left side.      Achilles reflexes are 1+ on the right side and 1+ on the left side. Skin: Skin is warm, dry and intact.    ED Course  Procedures (including critical care time) Labs Review Labs Reviewed  CBC - Abnormal; Notable for the following:    RBC 3.83 (*)    Hemoglobin 11.0 (*)    HCT 33.0 (*)    All other components within normal limits  BASIC METABOLIC PANEL - Abnormal; Notable for the following:    Potassium 3.6 (*)    Glucose, Bld 118 (*)    BUN 26 (*)    Creatinine, Ser 1.72 (*)    GFR calc non Af Amer 28 (*)    GFR calc Af Amer 32 (*)    All other components within normal limits  POCT I-STAT TROPONIN I  OCCULT BLOOD, POC DEVICE   Imaging Review Dg Chest 2 View  02/10/2013   CLINICAL DATA:  Syncope.  Hypertension.  EXAM: CHEST  2 VIEW  COMPARISON:  DG CHEST 2 VIEW dated 05/26/2012  FINDINGS: Patient rotated right. Midline trachea. Normal heart size and mediastinal contours for age. No pleural effusion or pneumothorax. Clear lungs.  IMPRESSION: No acute cardiopulmonary disease.   Electronically Signed   By: Jeronimo GreavesKyle  Talbot M.D.   On: 02/10/2013 21:43    EKG Interpretation    Date/Time:  Monday February 10 2013  20:00:03 EST Ventricular Rate:  58 PR Interval:  263 QRS Duration: 83 QT Interval:  450 QTC Calculation: 442 R Axis:  49 Text Interpretation:  Sinus rhythm Prolonged PR interval Abnormal R-wave progression, early transition ED PHYSICIAN INTERPRETATION AVAILABLE IN [facility] Confirmed by TEST, RECORD ([id number]) on 02/12/2013 9:57:22 AM            MDM   1. Near syncope   2. Constipation    Pt with a near-syncopal event.  EMS report does not match the patient's HPI.  PE without neurologic deficits.  Discussed patient history, condition, and labs with Dr. Gwendolyn Grant. After his encounter with the patient advises against Head CT.  XR without acute findings, EKG with prolonged PR interval.  BMP shows increased BUN and Cr, no obvious electrolyte abnormality to cause near syncope.  Anemia, hbg 11.0 stable. No blood on DRE.  Patient is constipated in the ED. Dr. Gwendolyn Grant advises home treatment and discharge home. Discussed lab results, imaging results, and treatment plan with the patient. Return precautions given. Reports understanding and no other concerns at this time.  Patient is stable for discharge at this time.  Meds given in ED:  Medications - No data to display  Discharge Medication List as of 02/10/2013 10:38 PM    START taking these medications   Details  magnesium hydroxide (MILK OF MAGNESIA) 400 MG/5ML suspension Take 15 mLs by mouth daily as needed for mild constipation., Starting 02/10/2013, Until Discontinued, Print              Clabe Seal, PA-C 02/12/13 1352

## 2013-02-11 LAB — OCCULT BLOOD, POC DEVICE: FECAL OCCULT BLD: NEGATIVE

## 2013-02-12 NOTE — ED Provider Notes (Signed)
Medical screening examination/treatment/procedure(s) were conducted as a shared visit with non-physician practitioner(s) and myself.  I personally evaluated the patient during the encounter.  EKG Interpretation    Date/Time:  Monday February 10 2013 20:00:03 EST Ventricular Rate:  58 PR Interval:  263 QRS Duration: 83 QT Interval:  450 QTC Calculation: 442 R Axis:   49 Text Interpretation:  Sinus rhythm Prolonged PR interval Abnormal R-wave progression, early transition ED PHYSICIAN INTERPRETATION AVAILABLE IN [facility] Confirmed by TEST, RECORD ([id number]) on 02/12/2013 9:57:22 AM             78 year old female presents with near syncope. She just said up to go to the bathroom and felt lightheaded and off balance. No syncopal event. She had mild decrease in vision and hold onto the wall to keep from falling. She had small blood with a BM today on the toilet paper. Here she is Hemoccult negative. She has a history hemorrhoids. Darl PikesSusan says that her shortness of breath. Here she is comfortable with me exam is benign. No abdominal tenderness for me. Her labs all normal her hemoglobin is stable. With negative Hemoccult, do not think she's had a GI bleed. I think this is likely near syncope from orthostasis with standing up too quickly. Her EKG is benign. She is stable for discharge and she can followup with her regular  Dagmar HaitWilliam Julen Rubert, MD 02/12/13 1606

## 2014-02-26 ENCOUNTER — Encounter: Payer: Self-pay | Admitting: Podiatry

## 2014-02-26 ENCOUNTER — Ambulatory Visit (INDEPENDENT_AMBULATORY_CARE_PROVIDER_SITE_OTHER): Payer: Medicare (Managed Care) | Admitting: Podiatry

## 2014-02-26 VITALS — Ht 66.0 in | Wt 126.0 lb

## 2014-02-26 DIAGNOSIS — L600 Ingrowing nail: Secondary | ICD-10-CM | POA: Diagnosis not present

## 2014-02-26 DIAGNOSIS — M7750 Other enthesopathy of unspecified foot: Secondary | ICD-10-CM | POA: Diagnosis not present

## 2014-02-26 DIAGNOSIS — M779 Enthesopathy, unspecified: Secondary | ICD-10-CM

## 2014-02-26 NOTE — Progress Notes (Signed)
   Subjective:    Patient ID: Emma NakayamaSarah M Tyler, female    DOB: 1935-09-26, 79 y.o.   MRN: [id number]  HPI Comments: Pt complains of painful left 1st toenail, and dry skin.     Review of Systems  All other systems reviewed and are negative.      Objective:   Physical Exam        Assessment & Plan:

## 2014-02-27 NOTE — Progress Notes (Signed)
Subjective:     Patient ID: [name], female   DOB: 03-15-1935, 79 y.o.   MRN: [id number]  HPI patient presents with a concerning nailbed left hallux that she states bother her and just generalized foot pain left over right. She does not have good verbal skills and is difficult to communicate with   Review of Systems  All other systems reviewed and are negative.      Objective:   Physical Exam  Musculoskeletal: Normal range of motion.  Neurological: She is alert.  Skin: Skin is dry.  Vitals reviewed.  neurovascular status is found to be diminished but unchanged with distal thick tissue formation and moderate discomfort in the dorsum of the left over right foot. She has some nail disease with incurvation of the lateral border left hallux and does have mild diminishment of digital perfusion to the lesser digits with it being intact but reduced     Assessment:     Inflammatory condition with also probable ingrown toenail left hallux lateral border    Plan:     Explain both problems to her and today debrided the nailbed and advised on supportive shoe along with stretching exercises. If symptoms persist reappoint or trying to be as conservative as possible which I spent time explaining to her today

## 2014-06-08 ENCOUNTER — Other Ambulatory Visit: Payer: Self-pay | Admitting: *Deleted

## 2014-06-08 DIAGNOSIS — I70243 Atherosclerosis of native arteries of left leg with ulceration of ankle: Secondary | ICD-10-CM

## 2014-07-07 ENCOUNTER — Encounter: Payer: Self-pay | Admitting: Surgery

## 2014-07-13 ENCOUNTER — Other Ambulatory Visit: Payer: Self-pay

## 2014-07-13 ENCOUNTER — Encounter: Payer: Self-pay | Admitting: Surgery

## 2014-07-13 ENCOUNTER — Ambulatory Visit (INDEPENDENT_AMBULATORY_CARE_PROVIDER_SITE_OTHER)
Admission: RE | Admit: 2014-07-13 | Discharge: 2014-07-13 | Disposition: A | Discharge status: Home / Self Care | Payer: Medicare (Managed Care) | Source: Ambulatory Visit | Attending: Surgery | Admitting: Surgery

## 2014-07-13 ENCOUNTER — Ambulatory Visit (HOSPITAL_COMMUNITY)
Admission: RE | Admit: 2014-07-13 | Discharge: 2014-07-13 | Disposition: A | Discharge status: Home / Self Care | Payer: Medicare (Managed Care) | Source: Ambulatory Visit | Attending: Surgery | Admitting: Surgery

## 2014-07-13 ENCOUNTER — Ambulatory Visit (INDEPENDENT_AMBULATORY_CARE_PROVIDER_SITE_OTHER): Payer: Medicare (Managed Care) | Admitting: Surgery

## 2014-07-13 VITALS — BP 140/74 | HR 62 | Temp 98.2°F | Resp 16 | Ht 66.0 in | Wt 129.0 lb

## 2014-07-13 DIAGNOSIS — I70299 Other atherosclerosis of native arteries of extremities, unspecified extremity: Secondary | ICD-10-CM

## 2014-07-13 DIAGNOSIS — I70243 Atherosclerosis of native arteries of left leg with ulceration of ankle: Secondary | ICD-10-CM | POA: Insufficient documentation

## 2014-07-13 DIAGNOSIS — L97909 Non-pressure chronic ulcer of unspecified part of unspecified lower leg with unspecified severity: Secondary | ICD-10-CM | POA: Diagnosis not present

## 2014-07-13 NOTE — Progress Notes (Signed)
Patient name: [name] MRN: [id number] DOB: Dec 26, 1935 Sex: female   Referred by: Dr. Dorothe Pea  Reason for referral:  Chief Complaint  Patient presents with  . PAD    [facility] Dr. Dorothe Pea   BLE swelling from mid calf down to toes.  Has wounds on Left foot and ankle       HISTORY OF PRESENT ILLNESS: The patient is referred today for evaluation of a left lateral malleolar ulcer.  This concerned that this may be pressure related.  It has scabbed over.  She does have a history of peripheral vascular disease, having undergone ankle brachial indices that were diminished to 0.73 on the left and 0.62 on the right.  She does complain of pain in the left leg.  Patient has a history of multiple strokes which have left her with right-sided weakness and dysarthria.  She suffers from hypertension which is moderately well controlled when she takes her medications.  Her hypercholesterolemia is managed with a statin.  She is on Plavix.  He has stage III kidney disease she is a nonsmoker  Past Medical History  Diagnosis Date  . Hypertension   . Stroke May 2009    multiple, last one Nov 2012, 2 strokes with right sided deficits and slurred speech  . Arthritis     hands  . Depression   . Asthma   . Hypochromic anemia   . Hyperlipidemia   . Hx of adenomatous colonic polyps   . Diverticulosis   . Chronic kidney disease (CKD), stage III (moderate)   . Internal hemorrhoids     Past Surgical History  Procedure Laterality Date  . Vascular surgery    . Abdominal hysterectomy    . Other surgical history      2 ear surgeries  . Tonsillectomy    . Abdominal surgery    . Colectomy    . Subtotal colectomy  2010    for stricturing from bouts of diverticulitis along with hx recurrent diverticular bleeds.  the surgery was a total abdominal colec  . Flexible sigmoidoscopy N/A 05/28/2012    Procedure: FLEXIBLE SIGMOIDOSCOPY;  Surgeon: Hart Carwin, MD;  Location: [facility] ENDOSCOPY;   Service: Endoscopy;  Laterality: N/A;    History   Social History  . Marital Status: Single    Spouse Name: N/A  . Number of Children: N/A  . Years of Education: N/A   Occupational History  . Not on file.   Social History Main Topics  . Smoking status: Former Games developer  . Smokeless tobacco: Never Used  . Alcohol Use: No  . Drug Use: No  . Sexual Activity: No   Other Topics Concern  . Not on file   Social History Narrative    Family History  Problem Relation Age of Onset  . Diabetes Paternal Aunt   . Colon cancer Neg Hx   . Stroke Father   . HIV Brother   . Heart disease Father   . Diabetes Father   . Prostate cancer    . Depression Father   . Cancer Sister     unknown type    Allergies as of 07/13/2014 - Review Complete 07/13/2014  Allergen Reaction Noted  . Aspirin Nausea And Vomiting     Current Outpatient Prescriptions on File Prior to Visit  Medication Sig Dispense Refill  . atorvastatin (LIPITOR) 40 MG tablet Take 40 mg by mouth daily.    . clopidogrel (PLAVIX) 75 MG  tablet Take 75 mg by mouth daily.    . magnesium hydroxide (MILK OF MAGNESIA) 400 MG/5ML suspension Take 15 mLs by mouth daily as needed for mild constipation. 360 mL 0   No current facility-administered medications on file prior to visit.     REVIEW OF SYSTEMS: Cardiovascular: No chest pain, chest pressure, palpitations, orthopnea, or dyspnea on exertion. No claudication or rest pain,  No history of DVT or phlebitis. Pulmonary: No productive cough, asthma or wheezing. Neurologic: No weakness, paresthesias, aphasia, or amaurosis. No dizziness. Hematologic: No bleeding problems or clotting disorders. Musculoskeletal: left leg pain. Gastrointestinal: No blood in stool or hematemesis Genitourinary: No dysuria or hematuria. Psychiatric:: No history of major depression. Integumentary:  Healing left ankle ulcer Constitutional: No fever or chills.  PHYSICAL EXAMINATION:  Filed Vitals:    07/13/14 1219  BP: 140/74  Pulse: 62  Temp: 98.2 F (36.8 C)  TempSrc: Oral  Resp: 16  Height: 5\' 6"  (1.676 m)  Weight: 129 lb (58.514 kg)  SpO2: 96%   Body mass index is 20.83 kg/(m^2). General: The patient appears their stated age.   HEENT:  No gross abnormalities Pulmonary: Respirations are non-labored Abdomen: Soft and non-tender  Musculoskeletal: There are no major deformities.   Neurologic: No focal weakness or paresthesias are detected, Skin: Eschar on the outside of the left malleolus Psychiatric: The patient has normal affect. Cardiovascular: There is a regular rate and rhythm without significant murmur appreciated.  Nonpalpable pedal pulses.  No carotid bruits  Diagnostic Studies: Ultrasound studies were performed today.  ABI on the right is 0.88 with monophasic waveforms.  On the left is 1.0 with monophasic waveforms.  Duplex evaluation shows stenosis in the mid right superficial femoral artery and in the mid left superficial femoral artery with posterior tibial artery occlusion on the left.    Assessment:  Left leg ulcer Plan: The patient has nearly healed her left leg ulcer.  There is concern this may be pressure related and therefore has a high likelihood of recurrence.  Ultrasound studies showed some discrepancy in our office today versus previous studies.  She does appear to have a mid left superficial femoral artery stenosis.  Therefore, I have recommended proceeding with angiography via a right femoral approach.  I may need to use CO2 as the patient has stage III renal insufficiency.  I will plan on intervening in the left leg above the knee if reasonable.  This is been scheduled for Wednesday, June 29     V. Charlena CrossWells Brabham IV, M.D. [facility] [place] Office: [phone] Pager:  [phone]

## 2014-07-20 MED ORDER — SODIUM CHLORIDE 0.9 % IV SOLN
INTRAVENOUS | Status: DC
  Administered 2014-07-21: 08:00:00 via INTRAVENOUS

## 2014-07-21 ENCOUNTER — Ambulatory Visit (HOSPITAL_COMMUNITY)
Admission: RE | Admit: 2014-07-21 | Discharge: 2014-07-21 | Disposition: A | Discharge status: Home / Self Care | Payer: Medicare (Managed Care) | Source: Ambulatory Visit | Attending: Surgery | Admitting: Surgery

## 2014-07-21 ENCOUNTER — Encounter (HOSPITAL_COMMUNITY): Payer: Self-pay | Admitting: Surgery

## 2014-07-21 ENCOUNTER — Encounter (HOSPITAL_COMMUNITY)
Admission: RE | Disposition: A | Discharge status: Home / Self Care | Payer: Medicare (Managed Care) | Source: Ambulatory Visit | Attending: Surgery

## 2014-07-21 ENCOUNTER — Encounter (HOSPITAL_COMMUNITY): Payer: Self-pay | Admitting: Anesthesiology

## 2014-07-21 ENCOUNTER — Encounter: Payer: Self-pay | Admitting: *Deleted

## 2014-07-21 ENCOUNTER — Other Ambulatory Visit: Payer: Self-pay | Admitting: *Deleted

## 2014-07-21 DIAGNOSIS — I69351 Hemiplegia and hemiparesis following cerebral infarction affecting right dominant side: Secondary | ICD-10-CM | POA: Insufficient documentation

## 2014-07-21 DIAGNOSIS — I69322 Dysarthria following cerebral infarction: Secondary | ICD-10-CM | POA: Insufficient documentation

## 2014-07-21 DIAGNOSIS — F329 Major depressive disorder, single episode, unspecified: Secondary | ICD-10-CM | POA: Insufficient documentation

## 2014-07-21 DIAGNOSIS — N183 Chronic kidney disease, stage 3 (moderate): Secondary | ICD-10-CM | POA: Diagnosis not present

## 2014-07-21 DIAGNOSIS — Z8679 Personal history of other diseases of the circulatory system: Secondary | ICD-10-CM

## 2014-07-21 DIAGNOSIS — M19042 Primary osteoarthritis, left hand: Secondary | ICD-10-CM | POA: Insufficient documentation

## 2014-07-21 DIAGNOSIS — K648 Other hemorrhoids: Secondary | ICD-10-CM | POA: Insufficient documentation

## 2014-07-21 DIAGNOSIS — J45909 Unspecified asthma, uncomplicated: Secondary | ICD-10-CM | POA: Insufficient documentation

## 2014-07-21 DIAGNOSIS — L97329 Non-pressure chronic ulcer of left ankle with unspecified severity: Secondary | ICD-10-CM | POA: Insufficient documentation

## 2014-07-21 DIAGNOSIS — I70243 Atherosclerosis of native arteries of left leg with ulceration of ankle: Secondary | ICD-10-CM | POA: Insufficient documentation

## 2014-07-21 DIAGNOSIS — Z7902 Long term (current) use of antithrombotics/antiplatelets: Secondary | ICD-10-CM | POA: Insufficient documentation

## 2014-07-21 DIAGNOSIS — Z87891 Personal history of nicotine dependence: Secondary | ICD-10-CM | POA: Diagnosis not present

## 2014-07-21 DIAGNOSIS — D509 Iron deficiency anemia, unspecified: Secondary | ICD-10-CM | POA: Diagnosis not present

## 2014-07-21 DIAGNOSIS — M19041 Primary osteoarthritis, right hand: Secondary | ICD-10-CM | POA: Diagnosis not present

## 2014-07-21 DIAGNOSIS — E780 Pure hypercholesterolemia: Secondary | ICD-10-CM | POA: Insufficient documentation

## 2014-07-21 DIAGNOSIS — I739 Peripheral vascular disease, unspecified: Secondary | ICD-10-CM

## 2014-07-21 DIAGNOSIS — I129 Hypertensive chronic kidney disease with stage 1 through stage 4 chronic kidney disease, or unspecified chronic kidney disease: Secondary | ICD-10-CM | POA: Insufficient documentation

## 2014-07-21 DIAGNOSIS — I70248 Atherosclerosis of native arteries of left leg with ulceration of other part of lower left leg: Secondary | ICD-10-CM | POA: Diagnosis present

## 2014-07-21 DIAGNOSIS — I70249 Atherosclerosis of native arteries of left leg with ulceration of unspecified site: Secondary | ICD-10-CM | POA: Diagnosis not present

## 2014-07-21 DIAGNOSIS — Z86010 Personal history of colonic polyps: Secondary | ICD-10-CM | POA: Diagnosis not present

## 2014-07-21 HISTORY — PX: PERIPHERAL VASCULAR CATHETERIZATION: SHX172C

## 2014-07-21 LAB — POCT ACTIVATED CLOTTING TIME
ACTIVATED CLOTTING TIME: 190 s
Activated Clotting Time: 202 seconds
Activated Clotting Time: 251 seconds
Activated Clotting Time: 171 seconds

## 2014-07-21 LAB — POCT I-STAT, CHEM 8
BUN: 26 mg/dL — ABNORMAL HIGH (ref 6–20)
Calcium, Ion: 1.19 mmol/L (ref 1.13–1.30)
Chloride: 103 mmol/L (ref 101–111)
Creatinine, Ser: 1.8 mg/dL — ABNORMAL HIGH (ref 0.44–1.00)
Glucose, Bld: 99 mg/dL (ref 65–99)
HCT: 37 % (ref 36.0–46.0)
Hemoglobin: 12.6 g/dL (ref 12.0–15.0)
Potassium: 4.1 mmol/L (ref 3.5–5.1)
Sodium: 140 mmol/L (ref 135–145)
TCO2: 27 mmol/L (ref 0–100)

## 2014-07-21 SURGERY — ABDOMINAL AORTOGRAM W/LOWER EXTREMITY
Anesthesia: General

## 2014-07-21 MED ORDER — ACETAMINOPHEN 325 MG RE SUPP
325.0000 mg | RECTAL | Status: DC | PRN
  Filled 2014-07-21: qty 2

## 2014-07-21 MED ORDER — MIDAZOLAM HCL 2 MG/2ML IJ SOLN
INTRAMUSCULAR | Status: DC | PRN
  Administered 2014-07-21: 1 mg via INTRAVENOUS

## 2014-07-21 MED ORDER — METOPROLOL TARTRATE 1 MG/ML IV SOLN: 2.0000 mg | INTRAVENOUS | Status: DC | PRN

## 2014-07-21 MED ORDER — ACETAMINOPHEN 325 MG PO TABS
325.0000 mg | ORAL_TABLET | ORAL | Status: DC | PRN
  Filled 2014-07-21: qty 2

## 2014-07-21 MED ORDER — DOCUSATE SODIUM 100 MG PO CAPS: 100.0000 mg | ORAL_CAPSULE | Freq: Every day | ORAL | Status: DC

## 2014-07-21 MED ORDER — HYDRALAZINE HCL 20 MG/ML IJ SOLN
INTRAMUSCULAR | Status: AC
  Filled 2014-07-21: qty 1

## 2014-07-21 MED ORDER — HEPARIN SODIUM (PORCINE) 1000 UNIT/ML IJ SOLN
INTRAMUSCULAR | Status: AC
  Filled 2014-07-21: qty 1

## 2014-07-21 MED ORDER — MORPHINE SULFATE 10 MG/ML IJ SOLN: 2.0000 mg | INTRAMUSCULAR | Status: DC | PRN

## 2014-07-21 MED ORDER — HEPARIN SODIUM (PORCINE) 1000 UNIT/ML IJ SOLN
INTRAMUSCULAR | Status: DC | PRN
  Administered 2014-07-21: 2000 [IU] via INTRAVENOUS
  Administered 2014-07-21: 6000 [IU] via INTRAVENOUS

## 2014-07-21 MED ORDER — SODIUM CHLORIDE 0.9 % IV SOLN: 1.0000 mL/kg/h | INTRAVENOUS | Status: DC

## 2014-07-21 MED ORDER — PHENOL 1.4 % MT LIQD: 1.0000 | OROMUCOSAL | Status: DC | PRN

## 2014-07-21 MED ORDER — MIDAZOLAM HCL 2 MG/2ML IJ SOLN
INTRAMUSCULAR | Status: AC
  Filled 2014-07-21: qty 2

## 2014-07-21 MED ORDER — ONDANSETRON HCL 4 MG/2ML IJ SOLN: 4.0000 mg | Freq: Four times a day (QID) | INTRAMUSCULAR | Status: DC | PRN

## 2014-07-21 MED ORDER — HEPARIN (PORCINE) IN NACL 2-0.9 UNIT/ML-% IJ SOLN
INTRAMUSCULAR | Status: AC
  Filled 2014-07-21: qty 1000

## 2014-07-21 MED ORDER — LABETALOL HCL 5 MG/ML IV SOLN: 10.0000 mg | INTRAVENOUS | Status: DC | PRN

## 2014-07-21 MED ORDER — VERAPAMIL HCL 2.5 MG/ML IV SOLN
INTRAVENOUS | Status: AC
  Filled 2014-07-21: qty 2

## 2014-07-21 MED ORDER — ALUM & MAG HYDROXIDE-SIMETH 200-200-20 MG/5ML PO SUSP
15.0000 mL | ORAL | Status: DC | PRN
  Filled 2014-07-21: qty 30

## 2014-07-21 MED ORDER — NITROGLYCERIN IN D5W 200-5 MCG/ML-% IV SOLN
INTRAVENOUS | Status: AC
  Filled 2014-07-21: qty 250

## 2014-07-21 MED ORDER — NITROGLYCERIN 1 MG/10 ML FOR IR/CATH LAB
INTRA_ARTERIAL | Status: DC | PRN
  Administered 2014-07-21: 200 ug via INTRA_ARTERIAL

## 2014-07-21 MED ORDER — OXYCODONE HCL 5 MG PO TABS: 5.0000 mg | ORAL_TABLET | ORAL | Status: DC | PRN

## 2014-07-21 MED ORDER — FENTANYL CITRATE (PF) 100 MCG/2ML IJ SOLN
INTRAMUSCULAR | Status: DC | PRN
  Administered 2014-07-21: 25 ug via INTRAVENOUS

## 2014-07-21 MED ORDER — LIDOCAINE HCL (PF) 1 % IJ SOLN
INTRAMUSCULAR | Status: AC
  Filled 2014-07-21: qty 30

## 2014-07-21 MED ORDER — FENTANYL CITRATE (PF) 100 MCG/2ML IJ SOLN
INTRAMUSCULAR | Status: AC
  Filled 2014-07-21: qty 2

## 2014-07-21 MED ORDER — HYDRALAZINE HCL 20 MG/ML IJ SOLN
5.0000 mg | INTRAMUSCULAR | Status: DC | PRN
  Administered 2014-07-21: 5 mg via INTRAVENOUS

## 2014-07-21 MED ORDER — GUAIFENESIN-DM 100-10 MG/5ML PO SYRP
15.0000 mL | ORAL_SOLUTION | ORAL | Status: DC | PRN
  Filled 2014-07-21: qty 15

## 2014-07-21 SURGICAL SUPPLY — 30 items
BALLN LUTONIX DCB 5X60X130 (BALLOONS) ×3
BALLOON LUTONIX DCB 5X60X130 (BALLOONS) IMPLANT
BALLOON POWERFLX PRO 5X40X135 (BALLOONS) ×2 IMPLANT
CATH ANGIO 5F BER2 100CM (CATHETERS) ×2 IMPLANT
CATH ANGIO 5F PIGTAIL 65CM (CATHETERS) ×2 IMPLANT
CATH CROSS OVER TEMPO 5F (CATHETERS) ×2 IMPLANT
CATH STRAIGHT 5FR 65CM (CATHETERS) ×2 IMPLANT
COVER PRB 48X5XTLSCP FOLD TPE (BAG) IMPLANT
COVER PROBE 5X48 (BAG) ×3
DIAMONDBACK CLASSIC OAS 2.0MM (CATHETERS) ×3
DRAPE ZERO GRAVITY STERILE (DRAPES) ×2 IMPLANT
FILTER CO2 0.2 MICRON (VASCULAR PRODUCTS) ×4 IMPLANT
KIT ENCORE 26 ADVANTAGE (KITS) ×2 IMPLANT
KIT MICROINTRODUCER STIFF 5F (SHEATH) ×2 IMPLANT
KIT PV (KITS) ×3 IMPLANT
LUBRICANT VIPERSLIDE CORONARY (MISCELLANEOUS) ×2 IMPLANT
RESERVOIR CO2 (VASCULAR PRODUCTS) ×4 IMPLANT
SET FLUSH CO2 (MISCELLANEOUS) ×2 IMPLANT
SHEATH PINNACLE 5F 10CM (SHEATH) ×2 IMPLANT
SHEATH PINNACLE ST 6F 45CM (SHEATH) ×2 IMPLANT
SHIELD RADPAD SCOOP 12X17 (MISCELLANEOUS) ×2 IMPLANT
STENT SMART CONTROL 6X60X120 (Permanent Stent) ×2 IMPLANT
SYR MEDRAD MARK V 150ML (SYRINGE) ×2 IMPLANT
SYSTEM DIMODBCK CLSC OAS 2.0MM (CATHETERS) IMPLANT
TAPE RADIOPAQUE TURBO (MISCELLANEOUS) ×2 IMPLANT
TRANSDUCER W/STOPCOCK (MISCELLANEOUS) ×3 IMPLANT
TRAY PV CATH (CUSTOM PROCEDURE TRAY) ×3 IMPLANT
WIRE BENTSON .035X145CM (WIRE) ×2 IMPLANT
WIRE SPARTACORE .014X300CM (WIRE) ×2 IMPLANT
WIRE VIPER ADVANCE .017X335CM (WIRE) ×2 IMPLANT

## 2014-07-21 NOTE — Progress Notes (Signed)
Spoke to Dr. Nechama Guard about when to discharge patient and he said patient may end bed rest at [place] and may discharge provided that discharge criteria is met.  Patient ambulated in hall for over 10 minutes and site and vital signs are stable-

## 2014-07-21 NOTE — Research (Signed)
SAFE REGISTRY Informed Consent   Subject Name: [name]  Subject met inclusion and exclusion criteria.  The informed consent form, study requirements and expectations were reviewed with the subject and questions and concerns were addressed prior to the signing of the consent form.  The subject verbalized understanding of the trail requirements.  The subject agreed to participate in the SAFE REGISTRY trial and signed the informed consent.  The informed consent was obtained prior to performance of any protocol-specific procedures for the subject.  A copy of the signed informed consent was given to the subject and a copy was placed in the subject's medical record.  Sandie Ano 07/21/2014, 8:20

## 2014-07-21 NOTE — Interval H&P Note (Signed)
History and Physical Interval Note:  07/21/2014 8:14 AM  [name]  has presented today for surgery, with the diagnosis of PVD  The various methods of treatment have been discussed with the patient and family. After consideration of risks, benefits and other options for treatment, the patient has consented to  Procedure(s): Abdominal Aortogram w/Lower Extremity (N/A) as a surgical intervention .  The patient's history has been reviewed, patient examined, no change in status, stable for surgery.  I have reviewed the patient's chart and labs.  Questions were answered to the patient's satisfaction.     Durene CalBrabham, Wells

## 2014-07-21 NOTE — Op Note (Addendum)
Patient name: Emma NakayamaSarah M Tyler MRN: [id number] DOB: 1935/01/26 Sex: female  07/21/2014 Pre-operative Diagnosis: Left leg ulcer Post-operative diagnosis:  Same Surgeon:  Durene CalBrabham, Wells Procedure Performed:  1.  Ultrasound-guided access, right femoral artery  2.  Abdominal aortogram with CO2  3.  Left lower extremity runoff  4.  Atherectomy, left superficial femoral artery  5.  Drug coated balloon angioplasty, left superficial femoral artery  6.  Stent, left superficial femoral artery  7.  Intra-arterial ministration nitroglycerin  Findings: 75% stenosis within the left superficial femoral artery.  This was initially treated with atherectomy and drug coated balloon angioplasty with less than 10% residual stenosis, however there was a dissection which was treated with a 6 mm stent.  A total of 35 cc of contrast was utilized   Indications:  The patient has a nonhealing wound on her left leg.  She is here for further evaluation as ultrasound suggested a stenosis in the left superficial femoral artery.  Procedure:  The patient was identified in the holding area and taken to room 8.  The patient was then placed supine on the table and prepped and draped in the usual sterile fashion.  A time out was called.  Ultrasound was used to evaluate the right common femoral artery.  It was patent .  A digital ultrasound image was acquired.  A micropuncture needle was used to access the right common femoral artery under ultrasound guidance.  An 018 wire was advanced without resistance and a micropuncture sheath was placed.  The 018 wire was removed and a benson wire was placed.  The micropuncture sheath was exchanged for a 5 french sheath.  An omniflush catheter was advanced over the wire to the level of L-1.  An abdominal angiogram was obtained with CO2.  Next, using the omniflush catheter and a benson wire, the aortic bifurcation was crossed and the catheter was placed into theleft external iliac artery and  left runoff was obtained.   Findings:   Aortogram:  Renal arteries are not well evaluated.  The infrarenal abdominal aorta appears patent.  Bilateral common and external iliac arteries appear widely patent  Right Lower Extremity:  Not evaluated given her renal insufficiency  Left Lower Extremity:  The left common femoral and profunda femoral arteries are widely patent.  The superficial femoral artery is patent however it is moderately calcified in the adductor canal with a focal 75% stenosis.  The popliteal artery is patent throughout it's course with single-vessel runoff via the peroneal artery.  Intervention:  After the above images were acquired, the decision was made to proceed with intervention.  A 6 French sheath was advanced into the left external iliac artery.  The patient was fully heparinized.  Using a Berenstein 2 catheter and a Benson wire, the lesion was crossed.  A Viper wire was then placed.  I performed atherectomy of the left superficial femoral artery using a 2.0 CSI classic device at low medium and high speeds.  I then utilized a 5 x 40 predilatation balloon which was taken to profile for 2 minutes.  Next a Lutonix 5 x 60 balloon was used for drug coated balloon angioplasty.  This was taken to nominal pressure for 3 minutes.  Completion imaging revealed a dissection which I could see into different views.  I felt that this needed to be treated with a stent and therefore a Cordis Smart control is 6 x 60 stent was deployed across the area of concern and then post  dilated with a 5 mm balloon.  Completion imaging revealed resolution of the stenosis as well as the dissection.  Runoff remained via a peroneal artery.  I then injected 200 g of nitroglycerin through the sheath into the superficial femoral artery.  Wire and catheters were removed and the sheath was withdrawn to the right external iliac artery.  The patient will be taken to the holding area for sheath pull once her coagulation profile  corrects.  Impression:  #1  focal 75% stenosis within the left superficial femoral artery at the adductor canal.  This was treated with atherectomy and drug coated angioplasty.  There was residual dissection and this was treated with stenting.  #2  single vessel runoff via the peroneal artery    V. Durene Cal, M.D. [facility] [place] Office: [phone] Pager:  [phone]

## 2014-07-21 NOTE — Discharge Instructions (Signed)

## 2014-07-21 NOTE — Progress Notes (Addendum)
Site area: RFA Site Prior to Removal:  Level 0 Pressure Applied For:7725min Manual:   yes Patient Status During Pull:  Experienced asymptomatic hypotension Post Pull Site:  Level 0 Post Pull Instructions Given:  yes Post Pull Pulses Present: palpable Dressing Applied:  clear Bedrest begins @ 1415 Comments: received 5mg  hydralazine IV pre sheath removal for SBP's in the 170's Dr Myra GianottiBrabham notified

## 2014-07-21 NOTE — Progress Notes (Signed)
Patient found to be bleeding out on covers of a round area of about 4 inches saturating through one thickness of blanket on her lap and gauze was saturated.  Pressure help from 1555 till 1620.  Arlys JohnBrian RN from cath lab cam to bed side and helped hold pressure.  Bleeding stopped.  Vital signs remained stable and patient had no symptoms of hypotension.  Dr. Myra GianottiBrabham notified and is to come see patient to make a decision if the patient should be discharged at a different time.  No new orders received.  Patient current resting in stable condition with no complaints with family at the bedside.

## 2014-07-21 NOTE — Progress Notes (Unsigned)
SAFE-DCB Informed Consent  Subject Name:[name] Subject met inclusion and exclusion criteria. The informed consent form, study requirements and expectations were reviewed with the subject and questions and concerns were addressed prior to the signing of the consent form. The subject verbalized understanding of the trial requirements. The subject agreed to participate in the SAFE-DCB trial and signed the informed consent. The informed consent was obtained prior to performance of any protocol-specific procedures for the subject. A copy of the signed informed consent was given to the subject and a copy was placed in the subject's medical record.   Jake Bathe, RN 07/21/2014

## 2014-07-21 NOTE — H&P (View-Only) (Signed)
   Patient name: [name] MRN: [id number] DOB: 12/01/1935 Sex: female   Referred by: Dr. Koehler  Reason for referral:  Chief Complaint  Patient presents with  . PAD    [facility] Dr. Koehler   BLE swelling from mid calf down to toes.  Has wounds on Left foot and ankle       HISTORY OF PRESENT ILLNESS: The patient is referred today for evaluation of a left lateral malleolar ulcer.  This concerned that this may be pressure related.  It has scabbed over.  She does have a history of peripheral vascular disease, having undergone ankle brachial indices that were diminished to 0.73 on the left and 0.62 on the right.  She does complain of pain in the left leg.  Patient has a history of multiple strokes which have left her with right-sided weakness and dysarthria.  She suffers from hypertension which is moderately well controlled when she takes her medications.  Her hypercholesterolemia is managed with a statin.  She is on Plavix.  He has stage III kidney disease she is a nonsmoker  Past Medical History  Diagnosis Date  . Hypertension   . Stroke May 2009    multiple, last one Nov 2012, 2 strokes with right sided deficits and slurred speech  . Arthritis     hands  . Depression   . Asthma   . Hypochromic anemia   . Hyperlipidemia   . Hx of adenomatous colonic polyps   . Diverticulosis   . Chronic kidney disease (CKD), stage III (moderate)   . Internal hemorrhoids     Past Surgical History  Procedure Laterality Date  . Vascular surgery    . Abdominal hysterectomy    . Other surgical history      2 ear surgeries  . Tonsillectomy    . Abdominal surgery    . Colectomy    . Subtotal colectomy  2010    for stricturing from bouts of diverticulitis along with hx recurrent diverticular bleeds.  the surgery was a total abdominal colec  . Flexible sigmoidoscopy N/A 05/28/2012    Procedure: FLEXIBLE SIGMOIDOSCOPY;  Surgeon: Dora M Brodie, MD;  Location: [facility] ENDOSCOPY;   Service: Endoscopy;  Laterality: N/A;    History   Social History  . Marital Status: Single    Spouse Name: N/A  . Number of Children: N/A  . Years of Education: N/A   Occupational History  . Not on file.   Social History Main Topics  . Smoking status: Former Smoker  . Smokeless tobacco: Never Used  . Alcohol Use: No  . Drug Use: No  . Sexual Activity: No   Other Topics Concern  . Not on file   Social History Narrative    Family History  Problem Relation Age of Onset  . Diabetes Paternal Aunt   . Colon cancer Neg Hx   . Stroke Father   . HIV Brother   . Heart disease Father   . Diabetes Father   . Prostate cancer    . Depression Father   . Cancer Sister     unknown type    Allergies as of 07/13/2014 - Review Complete 07/13/2014  Allergen Reaction Noted  . Aspirin Nausea And Vomiting     Current Outpatient Prescriptions on File Prior to Visit  Medication Sig Dispense Refill  . atorvastatin (LIPITOR) 40 MG tablet Take 40 mg by mouth daily.    . clopidogrel (PLAVIX) 75 MG   tablet Take 75 mg by mouth daily.    . magnesium hydroxide (MILK OF MAGNESIA) 400 MG/5ML suspension Take 15 mLs by mouth daily as needed for mild constipation. 360 mL 0   No current facility-administered medications on file prior to visit.     REVIEW OF SYSTEMS: Cardiovascular: No chest pain, chest pressure, palpitations, orthopnea, or dyspnea on exertion. No claudication or rest pain,  No history of DVT or phlebitis. Pulmonary: No productive cough, asthma or wheezing. Neurologic: No weakness, paresthesias, aphasia, or amaurosis. No dizziness. Hematologic: No bleeding problems or clotting disorders. Musculoskeletal: left leg pain. Gastrointestinal: No blood in stool or hematemesis Genitourinary: No dysuria or hematuria. Psychiatric:: No history of major depression. Integumentary:  Healing left ankle ulcer Constitutional: No fever or chills.  PHYSICAL EXAMINATION:  Filed Vitals:    07/13/14 1219  BP: 140/74  Pulse: 62  Temp: 98.2 F (36.8 C)  TempSrc: Oral  Resp: 16  Height: 5\' 6"  (1.676 m)  Weight: 129 lb (58.514 kg)  SpO2: 96%   Body mass index is 20.83 kg/(m^2). General: The patient appears their stated age.   HEENT:  No gross abnormalities Pulmonary: Respirations are non-labored Abdomen: Soft and non-tender  Musculoskeletal: There are no major deformities.   Neurologic: No focal weakness or paresthesias are detected, Skin: Eschar on the outside of the left malleolus Psychiatric: The patient has normal affect. Cardiovascular: There is a regular rate and rhythm without significant murmur appreciated.  Nonpalpable pedal pulses.  No carotid bruits  Diagnostic Studies: Ultrasound studies were performed today.  ABI on the right is 0.88 with monophasic waveforms.  On the left is 1.0 with monophasic waveforms.  Duplex evaluation shows stenosis in the mid right superficial femoral artery and in the mid left superficial femoral artery with posterior tibial artery occlusion on the left.    Assessment:  Left leg ulcer Plan: The patient has nearly healed her left leg ulcer.  There is concern this may be pressure related and therefore has a high likelihood of recurrence.  Ultrasound studies showed some discrepancy in our office today versus previous studies.  She does appear to have a mid left superficial femoral artery stenosis.  Therefore, I have recommended proceeding with angiography via a right femoral approach.  I may need to use CO2 as the patient has stage III renal insufficiency.  I will plan on intervening in the left leg above the knee if reasonable.  This is been scheduled for Wednesday, June 29     V. Charlena CrossWells Taequan Stockhausen IV, M.D. [facility] [place] Office: [phone] Pager:  [phone]

## 2014-07-22 MED FILL — Lidocaine HCl Local Preservative Free (PF) Inj 1%: INTRAMUSCULAR | Qty: 30 | Status: AC

## 2014-07-22 MED FILL — Heparin Sodium (Porcine) 2 Unit/ML in Sodium Chloride 0.9%: INTRAMUSCULAR | Qty: 1000 | Status: AC

## 2014-07-23 ENCOUNTER — Telehealth: Payer: Self-pay | Admitting: Surgery

## 2014-07-23 NOTE — Telephone Encounter (Signed)
All phone #s listed are not the residence of [name], mailed appt letter, dpm

## 2014-07-23 NOTE — Telephone Encounter (Signed)
-----   Message from Sharee PimpleMarilyn K McChesney, RN sent at 07/21/2014 10:59 AM EDT ----- Regarding: Schedule   ----- Message -----    From: [name], MD    Sent: 07/21/2014  10:33 AM      To: [facility] Charge Pool  07/21/2014:  Surgeon:  Durene CalBrabham, Wells Procedure Performed:  1.  Ultrasound-guided access, right femoral artery  2.  Abdominal aortogram with CO2  3.  Left lower extremity runoff  4.  Atherectomy, left superficial femoral artery  5.  Drug coated balloon angioplasty, left superficial femoral artery  6.  Stent, left superficial femoral artery  7.  Intra-arterial ministration nitroglycerin   Follow one month with a duplex of the left leg and ABIs

## 2014-08-13 ENCOUNTER — Encounter: Payer: Self-pay | Admitting: Surgery

## 2014-08-14 ENCOUNTER — Encounter (HOSPITAL_COMMUNITY): Payer: Medicare (Managed Care)

## 2014-08-17 ENCOUNTER — Encounter: Payer: Medicare (Managed Care) | Admitting: Surgery

## 2014-08-20 ENCOUNTER — Telehealth: Payer: Self-pay | Admitting: *Deleted

## 2014-08-20 NOTE — Telephone Encounter (Signed)
Called [name] for her 30 Day follow-up for the SAFE-DCB trial. I spoke with her daughter Angelique Blonder. She states she has been doing well and has had no hospitalizations or additional interventions to her lower extremities.

## 2014-08-25 ENCOUNTER — Ambulatory Visit (HOSPITAL_COMMUNITY)
Admission: RE | Admit: 2014-08-25 | Discharge: 2014-08-25 | Disposition: A | Discharge status: Home / Self Care | Payer: Medicare (Managed Care) | Source: Ambulatory Visit | Attending: Surgery | Admitting: Surgery

## 2014-08-25 ENCOUNTER — Ambulatory Visit (INDEPENDENT_AMBULATORY_CARE_PROVIDER_SITE_OTHER)
Admission: RE | Admit: 2014-08-25 | Discharge: 2014-08-25 | Disposition: A | Discharge status: Home / Self Care | Payer: Medicare (Managed Care) | Source: Ambulatory Visit | Attending: Vascular Surgery | Admitting: Vascular Surgery

## 2014-08-25 DIAGNOSIS — I739 Peripheral vascular disease, unspecified: Secondary | ICD-10-CM | POA: Insufficient documentation

## 2014-08-25 DIAGNOSIS — Z8679 Personal history of other diseases of the circulatory system: Secondary | ICD-10-CM | POA: Insufficient documentation

## 2014-09-06 ENCOUNTER — Encounter (HOSPITAL_COMMUNITY): Payer: Self-pay | Admitting: *Deleted

## 2014-09-06 ENCOUNTER — Emergency Department (HOSPITAL_COMMUNITY): Payer: Medicare (Managed Care)

## 2014-09-06 ENCOUNTER — Emergency Department (HOSPITAL_COMMUNITY)
Admission: EM | Admit: 2014-09-06 | Discharge: 2014-09-06 | Disposition: A | Discharge status: Home / Self Care | Payer: Medicare (Managed Care) | Attending: Emergency Medicine | Admitting: Emergency Medicine

## 2014-09-06 DIAGNOSIS — Y9289 Other specified places as the place of occurrence of the external cause: Secondary | ICD-10-CM | POA: Diagnosis not present

## 2014-09-06 DIAGNOSIS — Z8673 Personal history of transient ischemic attack (TIA), and cerebral infarction without residual deficits: Secondary | ICD-10-CM | POA: Diagnosis not present

## 2014-09-06 DIAGNOSIS — N183 Chronic kidney disease, stage 3 (moderate): Secondary | ICD-10-CM | POA: Insufficient documentation

## 2014-09-06 DIAGNOSIS — Y998 Other external cause status: Secondary | ICD-10-CM | POA: Diagnosis not present

## 2014-09-06 DIAGNOSIS — W010XXA Fall on same level from slipping, tripping and stumbling without subsequent striking against object, initial encounter: Secondary | ICD-10-CM | POA: Insufficient documentation

## 2014-09-06 DIAGNOSIS — S8992XA Unspecified injury of left lower leg, initial encounter: Secondary | ICD-10-CM | POA: Diagnosis present

## 2014-09-06 DIAGNOSIS — Z9889 Other specified postprocedural states: Secondary | ICD-10-CM | POA: Diagnosis not present

## 2014-09-06 DIAGNOSIS — Z8639 Personal history of other endocrine, nutritional and metabolic disease: Secondary | ICD-10-CM | POA: Insufficient documentation

## 2014-09-06 DIAGNOSIS — Z87891 Personal history of nicotine dependence: Secondary | ICD-10-CM | POA: Diagnosis not present

## 2014-09-06 DIAGNOSIS — Z862 Personal history of diseases of the blood and blood-forming organs and certain disorders involving the immune mechanism: Secondary | ICD-10-CM | POA: Insufficient documentation

## 2014-09-06 DIAGNOSIS — Z8739 Personal history of other diseases of the musculoskeletal system and connective tissue: Secondary | ICD-10-CM | POA: Diagnosis not present

## 2014-09-06 DIAGNOSIS — J45909 Unspecified asthma, uncomplicated: Secondary | ICD-10-CM | POA: Insufficient documentation

## 2014-09-06 DIAGNOSIS — M25562 Pain in left knee: Secondary | ICD-10-CM

## 2014-09-06 DIAGNOSIS — Z7902 Long term (current) use of antithrombotics/antiplatelets: Secondary | ICD-10-CM | POA: Diagnosis not present

## 2014-09-06 DIAGNOSIS — Y9389 Activity, other specified: Secondary | ICD-10-CM | POA: Diagnosis not present

## 2014-09-06 DIAGNOSIS — Z86010 Personal history of colonic polyps: Secondary | ICD-10-CM | POA: Diagnosis not present

## 2014-09-06 DIAGNOSIS — F329 Major depressive disorder, single episode, unspecified: Secondary | ICD-10-CM | POA: Insufficient documentation

## 2014-09-06 DIAGNOSIS — W19XXXA Unspecified fall, initial encounter: Secondary | ICD-10-CM

## 2014-09-06 DIAGNOSIS — Z8719 Personal history of other diseases of the digestive system: Secondary | ICD-10-CM | POA: Insufficient documentation

## 2014-09-06 DIAGNOSIS — Z79899 Other long term (current) drug therapy: Secondary | ICD-10-CM | POA: Insufficient documentation

## 2014-09-06 DIAGNOSIS — I129 Hypertensive chronic kidney disease with stage 1 through stage 4 chronic kidney disease, or unspecified chronic kidney disease: Secondary | ICD-10-CM | POA: Insufficient documentation

## 2014-09-06 NOTE — ED Notes (Signed)
Bed: WU98 Expected date: 09/06/14 Expected time: 5:46 PM Means of arrival: Ambulance Comments: Fall, KED unwitnessed

## 2014-09-06 NOTE — ED Notes (Signed)
Pt was getting up from chair and stumbled onto carpet.  Unwitnessed.  Patient is on plavix

## 2014-09-06 NOTE — Discharge Instructions (Signed)
X-ray of left knee was normal. No other obvious injuries. Follow-up your primary care doctor

## 2014-09-06 NOTE — ED Provider Notes (Signed)
CSN: [id number]     Arrival date & time 09/06/14  1805 History   First MD Initiated Contact with Patient 09/06/14 1903     Chief Complaint  Patient presents with  . Fall     (Consider location/radiation/quality/duration/timing/severity/associated sxs/prior Treatment) HPI..... Level V caveat for speech impediment secondary to stroke. Patient apparently had accidental mechanical fall today getting up from her chair and stumbling on the carpet. She struck her left knee. No head or neck trauma. Daughter reports normal behavior.  Past Medical History  Diagnosis Date  . Hypertension   . Stroke May 2009    multiple, last one Nov 2012, 2 strokes with right sided deficits and slurred speech  . Arthritis     hands  . Depression   . Asthma   . Hypochromic anemia   . Hyperlipidemia   . Hx of adenomatous colonic polyps   . Diverticulosis   . Chronic kidney disease (CKD), stage III (moderate)   . Internal hemorrhoids    Past Surgical History  Procedure Laterality Date  . Vascular surgery    . Abdominal hysterectomy    . Other surgical history      2 ear surgeries  . Tonsillectomy    . Abdominal surgery    . Colectomy    . Subtotal colectomy  2010    for stricturing from bouts of diverticulitis along with hx recurrent diverticular bleeds.  the surgery was a total abdominal colec  . Flexible sigmoidoscopy N/A 05/28/2012    Procedure: FLEXIBLE SIGMOIDOSCOPY;  Surgeon: Hart Carwin, MD;  Location: [facility] ENDOSCOPY;  Service: Endoscopy;  Laterality: N/A;  . Peripheral vascular catheterization N/A 07/21/2014    Procedure: Abdominal Aortogram w/Lower Extremity;  Surgeon: Nada Libman, MD;  Location: [facility] INVASIVE CV LAB;  Service: Cardiovascular;  Laterality: N/A;   Family History  Problem Relation Age of Onset  . Diabetes Paternal Aunt   . Colon cancer Neg Hx   . Stroke Father   . HIV Brother   . Heart disease Father   . Diabetes Father   . Prostate cancer    . Depression Father   .  Cancer Sister     unknown type   Social History  Substance Use Topics  . Smoking status: Former Games developer  . Smokeless tobacco: Never Used  . Alcohol Use: No   OB History    No data available     Review of Systems  Unable to perform ROS: Other      Allergies  Aspirin  Home Medications   Prior to Admission medications   Medication Sig Start Date End Date Taking? Authorizing Provider  Cholecalciferol (VITAMIN D3) 1000 UNITS CAPS Take by mouth.   Yes Historical Provider, MD  clopidogrel (PLAVIX) 75 MG tablet Take 75 mg by mouth daily.   Yes Historical Provider, MD  donepezil (ARICEPT) 10 MG tablet Take 10 mg by mouth at bedtime.   Yes Historical Provider, MD  hydrochlorothiazide (HYDRODIURIL) 25 MG tablet Take 25 mg by mouth daily.   Yes Historical Provider, MD  hydrocortisone (ANUSOL-HC) 25 MG suppository Place 25 mg rectally 2 (two) times daily as needed for hemorrhoids.    Yes Historical Provider, MD  loratadine (CLARITIN) 10 MG tablet Take 10 mg by mouth daily as needed for allergies.    Yes Historical Provider, MD  losartan (COZAAR) 25 MG tablet Take 25 mg by mouth daily.   Yes Historical Provider, MD  magnesium hydroxide (MILK OF MAGNESIA) 400 MG/5ML suspension Take  15 mLs by mouth daily as needed for mild constipation. 02/10/13  Yes Mellody Drown, PA-C  mirtazapine (REMERON) 15 MG tablet Take 15 mg by mouth at bedtime.   Yes Historical Provider, MD  sennosides-docusate sodium (SENOKOT-S) 8.6-50 MG tablet Take 1 tablet by mouth daily as needed for constipation.    Yes Historical Provider, MD  zolpidem (AMBIEN) 5 MG tablet Take 5 mg by mouth at bedtime as needed for sleep.   Yes Historical Provider, MD   BP 165/105 mmHg  Pulse 82  Temp(Src) 98.2 F (36.8 C) (Oral)  Resp 21  SpO2 91% Physical Exam  Constitutional: She appears well-developed and well-nourished.  HENT:  Head: Normocephalic and atraumatic.  Eyes: Conjunctivae and EOM are normal. Pupils are equal, round, and  reactive to light.  Neck: Normal range of motion. Neck supple.  Cardiovascular: Normal rate and regular rhythm.   Pulmonary/Chest: Effort normal and breath sounds normal.  Abdominal: Soft. Bowel sounds are normal.  Musculoskeletal: Normal range of motion.  Tender to palpation left anterior knee  Neurological: She is alert.  Skin: Skin is warm and dry.  Psychiatric: She has a normal mood and affect. Her behavior is normal.  Nursing note and vitals reviewed.   ED Course  Procedures (including critical care time) Labs Review Labs Reviewed - No data to display  Imaging Review Dg Knee Complete 4 Views Left  09/06/2014   CLINICAL DATA:  Fall walking kitchen. Hit leg on a chair. Abrasion to the anterior knee. Anterior and lateral left knee pain.  EXAM: LEFT KNEE - COMPLETE 4+ VIEW  COMPARISON:  Left knee radiographs 02/23/2012.  FINDINGS: Mild osteopenia is evident. Atherosclerotic calcifications are present in the popliteal artery. Minimal artery stent is noted.  The knee is located. There is no significant effusion. No acute fracture or dislocation is evident.  IMPRESSION: 1. No acute abnormality. 2. Mild osteopenia. 3. Atherosclerosis. 4. Femoral artery stent.   Electronically Signed   By: Marin Roberts M.D.   On: 09/06/2014 19:40   I, Betina Puckett, personally reviewed and evaluated these images and lab results as part of my medical decision-making.   EKG Interpretation None      MDM   Final diagnoses:  Fall, initial encounter  Left knee pain    No head or neck trauma. Plain films of left knee were negative for acute injury.  Daughter reports normal behavior.    Donnetta Hutching, MD 09/06/14 2129

## 2014-09-08 ENCOUNTER — Encounter: Payer: Self-pay | Admitting: Surgery

## 2014-09-09 ENCOUNTER — Ambulatory Visit (INDEPENDENT_AMBULATORY_CARE_PROVIDER_SITE_OTHER): Payer: Medicare (Managed Care) | Admitting: Surgery

## 2014-09-09 ENCOUNTER — Encounter: Payer: Self-pay | Admitting: Surgery

## 2014-09-09 VITALS — BP 139/65 | HR 78 | Temp 96.9°F | Resp 14 | Ht 66.0 in | Wt 129.0 lb

## 2014-09-09 DIAGNOSIS — L97909 Non-pressure chronic ulcer of unspecified part of unspecified lower leg with unspecified severity: Secondary | ICD-10-CM

## 2014-09-09 DIAGNOSIS — I70299 Other atherosclerosis of native arteries of extremities, unspecified extremity: Secondary | ICD-10-CM | POA: Diagnosis not present

## 2014-09-09 NOTE — Addendum Note (Signed)
Addended by: Adria Dill L on: 09/09/2014 05:51 PM   Modules accepted: Orders

## 2014-09-09 NOTE — Progress Notes (Signed)
Patient name: [name] MRN: [id number] DOB: November 02, 1935 Sex: female     Chief Complaint  Patient presents with  . Re-evaluation    f/u    HISTORY OF PRESENT ILLNESS: The patient is here today for follow-up.  She initially presented with a left lateral malleolar ulcer.  Arterial evaluation revealed diminished blood flow.  On 07/21/2014 she underwent angiography.  A 75% stenosis was identified within the left superficial femoral artery.  She underwent atherectomy and drug coated balloon angioplasty, followed by stenting.  Her procedure was uncomplicated.  Today, her ulcer has completely healed.  Anecdotally, she states the swelling in her left leg has improved.  She does not have any pain.  Past Medical History  Diagnosis Date  . Hypertension   . Stroke May 2009    multiple, last one Nov 2012, 2 strokes with right sided deficits and slurred speech  . Arthritis     hands  . Depression   . Asthma   . Hypochromic anemia   . Hyperlipidemia   . Hx of adenomatous colonic polyps   . Diverticulosis   . Chronic kidney disease (CKD), stage III (moderate)   . Internal hemorrhoids     Past Surgical History  Procedure Laterality Date  . Vascular surgery    . Abdominal hysterectomy    . Other surgical history      2 ear surgeries  . Tonsillectomy    . Abdominal surgery    . Colectomy    . Subtotal colectomy  2010    for stricturing from bouts of diverticulitis along with hx recurrent diverticular bleeds.  the surgery was a total abdominal colec  . Flexible sigmoidoscopy N/A 05/28/2012    Procedure: FLEXIBLE SIGMOIDOSCOPY;  Surgeon: Hart Carwin, MD;  Location: [facility] ENDOSCOPY;  Service: Endoscopy;  Laterality: N/A;  . Peripheral vascular catheterization N/A 07/21/2014    Procedure: Abdominal Aortogram w/Lower Extremity;  Surgeon: Nada Libman, MD;  Location: [facility] INVASIVE CV LAB;  Service: Cardiovascular;  Laterality: N/A;    Social History   Social History  . Marital  Status: Single    Spouse Name: N/A  . Number of Children: N/A  . Years of Education: N/A   Occupational History  . Not on file.   Social History Main Topics  . Smoking status: Former Games developer  . Smokeless tobacco: Never Used  . Alcohol Use: No  . Drug Use: No  . Sexual Activity: No   Other Topics Concern  . Not on file   Social History Narrative    Family History  Problem Relation Age of Onset  . Diabetes Paternal Aunt   . Colon cancer Neg Hx   . Stroke Father   . HIV Brother   . Heart disease Father   . Diabetes Father   . Prostate cancer    . Depression Father   . Cancer Sister     unknown type    Allergies as of 09/09/2014 - Review Complete 09/09/2014  Allergen Reaction Noted  . Aspirin Nausea And Vomiting     Current Outpatient Prescriptions on File Prior to Visit  Medication Sig Dispense Refill  . Cholecalciferol (VITAMIN D3) 1000 UNITS CAPS Take by mouth.    . clopidogrel (PLAVIX) 75 MG tablet Take 75 mg by mouth daily.    Marland Kitchen donepezil (ARICEPT) 10 MG tablet Take 10 mg by mouth at bedtime.    . hydrochlorothiazide (HYDRODIURIL) 25 MG tablet Take 25 mg by mouth daily.    Marland Kitchen  hydrocortisone (ANUSOL-HC) 25 MG suppository Place 25 mg rectally 2 (two) times daily as needed for hemorrhoids.     Marland Kitchen loratadine (CLARITIN) 10 MG tablet Take 10 mg by mouth daily as needed for allergies.     Marland Kitchen losartan (COZAAR) 25 MG tablet Take 25 mg by mouth daily.    . magnesium hydroxide (MILK OF MAGNESIA) 400 MG/5ML suspension Take 15 mLs by mouth daily as needed for mild constipation. 360 mL 0  . mirtazapine (REMERON) 15 MG tablet Take 15 mg by mouth at bedtime.    . sennosides-docusate sodium (SENOKOT-S) 8.6-50 MG tablet Take 1 tablet by mouth daily as needed for constipation.     Marland Kitchen zolpidem (AMBIEN) 5 MG tablet Take 5 mg by mouth at bedtime as needed for sleep.     No current facility-administered medications on file prior to visit.     REVIEW OF SYSTEMS: Cardiovascular: No  chest pain, chest pressure, palpitations, orthopnea, or dyspnea on exertion. No claudication or rest pain, No history of DVT or phlebitis. Pulmonary: No productive cough, asthma or wheezing. Neurologic: No weakness, paresthesias, aphasia, or amaurosis. No dizziness. Hematologic: No bleeding problems or clotting disorders. Musculoskeletal: left leg pain. Gastrointestinal: No blood in stool or hematemesis Genitourinary: No dysuria or hematuria. Psychiatric:: No history of major depression. Integumentary: Healing left ankle ulcer Constitutional: No fever or chills.  PHYSICAL EXAMINATION:   Vital signs are  Filed Vitals:   09/09/14 0912  BP: 139/65  Pulse: 78  Temp: 96.9 F (36.1 C)  Resp: 14  Height: 5\' 6"  (1.676 m)  Weight: 129 lb (58.514 kg)  SpO2: 98%   Body mass index is 20.83 kg/(m^2). General: The patient appears their stated age. HEENT:  No gross abnormalities Pulmonary:  Non labored breathing Abdomen: Soft and non-tender Musculoskeletal: There are no major deformities. Neurologic: No focal weakness or paresthesias are detected, Skin: Left ankle ulcer has healed Psychiatric: The patient has normal affect. Cardiovascular: There is a regular rate and rhythm without significant murmur appreciated. Nonpalpable pedal pulses  Diagnostic Studies I have reviewed her ultrasound studies.  Her ABI is 1.05 on the left and 0.81 on the right.  The left has biphasic waveforms as does the right.  The duplex shows a patent left superficial femoral artery stent with no hemodynamically significant stenosis.  Assessment: Healed left leg ulcer, status post percutaneous intervention to the left superficial femoral artery Plan: The patient is doing very well currently.  I will schedule her for follow-up again in 6 months for surveillance of her intervention with a duplex and ankle-brachial indices.  Contact me sooner if she develops a new problem.  Jorge Ny, M.D. [facility] Office: [phone] Pager:  [phone]

## 2014-12-28 ENCOUNTER — Emergency Department (HOSPITAL_COMMUNITY): Payer: Medicare (Managed Care)

## 2014-12-28 ENCOUNTER — Emergency Department (HOSPITAL_COMMUNITY)
Admission: EM | Admit: 2014-12-28 | Discharge: 2014-12-28 | Disposition: A | Discharge status: Home / Self Care | Payer: Medicare (Managed Care) | Attending: Emergency Medicine | Admitting: Emergency Medicine

## 2014-12-28 ENCOUNTER — Encounter (HOSPITAL_COMMUNITY): Payer: Self-pay | Admitting: *Deleted

## 2014-12-28 DIAGNOSIS — J45909 Unspecified asthma, uncomplicated: Secondary | ICD-10-CM | POA: Diagnosis not present

## 2014-12-28 DIAGNOSIS — R4182 Altered mental status, unspecified: Secondary | ICD-10-CM | POA: Diagnosis present

## 2014-12-28 DIAGNOSIS — I129 Hypertensive chronic kidney disease with stage 1 through stage 4 chronic kidney disease, or unspecified chronic kidney disease: Secondary | ICD-10-CM | POA: Diagnosis not present

## 2014-12-28 DIAGNOSIS — Z86010 Personal history of colonic polyps: Secondary | ICD-10-CM | POA: Insufficient documentation

## 2014-12-28 DIAGNOSIS — Z8719 Personal history of other diseases of the digestive system: Secondary | ICD-10-CM | POA: Insufficient documentation

## 2014-12-28 DIAGNOSIS — Z7902 Long term (current) use of antithrombotics/antiplatelets: Secondary | ICD-10-CM | POA: Insufficient documentation

## 2014-12-28 DIAGNOSIS — F329 Major depressive disorder, single episode, unspecified: Secondary | ICD-10-CM | POA: Insufficient documentation

## 2014-12-28 DIAGNOSIS — Z8739 Personal history of other diseases of the musculoskeletal system and connective tissue: Secondary | ICD-10-CM | POA: Insufficient documentation

## 2014-12-28 DIAGNOSIS — N390 Urinary tract infection, site not specified: Secondary | ICD-10-CM | POA: Diagnosis not present

## 2014-12-28 DIAGNOSIS — Z79899 Other long term (current) drug therapy: Secondary | ICD-10-CM | POA: Diagnosis not present

## 2014-12-28 DIAGNOSIS — Z862 Personal history of diseases of the blood and blood-forming organs and certain disorders involving the immune mechanism: Secondary | ICD-10-CM | POA: Insufficient documentation

## 2014-12-28 DIAGNOSIS — Z8673 Personal history of transient ischemic attack (TIA), and cerebral infarction without residual deficits: Secondary | ICD-10-CM | POA: Insufficient documentation

## 2014-12-28 DIAGNOSIS — N183 Chronic kidney disease, stage 3 (moderate): Secondary | ICD-10-CM | POA: Insufficient documentation

## 2014-12-28 LAB — DIFFERENTIAL
BASOS ABS: 0 10*3/uL (ref 0.0–0.1)
Basophils Relative: 0 %
Eosinophils Absolute: 0.1 10*3/uL (ref 0.0–0.7)
Eosinophils Relative: 2 %
LYMPHS ABS: 1.3 10*3/uL (ref 0.7–4.0)
LYMPHS PCT: 29 %
Monocytes Absolute: 0.3 10*3/uL (ref 0.1–1.0)
Monocytes Relative: 7 %
NEUTROS ABS: 2.7 10*3/uL (ref 1.7–7.7)
NEUTROS PCT: 62 %

## 2014-12-28 LAB — CBC
HEMATOCRIT: 32.7 % — AB (ref 36.0–46.0)
HEMOGLOBIN: 10.4 g/dL — AB (ref 12.0–15.0)
MCH: 27.5 pg (ref 26.0–34.0)
MCHC: 31.8 g/dL (ref 30.0–36.0)
MCV: 86.5 fL (ref 78.0–100.0)
Platelets: 170 10*3/uL (ref 150–400)
RBC: 3.78 MIL/uL — AB (ref 3.87–5.11)
RDW: 13.9 % (ref 11.5–15.5)
WBC: 4.4 10*3/uL (ref 4.0–10.5)

## 2014-12-28 LAB — COMPREHENSIVE METABOLIC PANEL
ALBUMIN: 3.2 g/dL — AB (ref 3.5–5.0)
ALT: 16 U/L (ref 14–54)
ANION GAP: 7 (ref 5–15)
AST: 28 U/L (ref 15–41)
Alkaline Phosphatase: 48 U/L (ref 38–126)
BUN: 27 mg/dL — ABNORMAL HIGH (ref 6–20)
CO2: 25 mmol/L (ref 22–32)
Calcium: 8.8 mg/dL — ABNORMAL LOW (ref 8.9–10.3)
Chloride: 105 mmol/L (ref 101–111)
Creatinine, Ser: 2.09 mg/dL — ABNORMAL HIGH (ref 0.44–1.00)
GFR calc Af Amer: 25 mL/min — ABNORMAL LOW (ref >60)
GFR, EST NON AFRICAN AMERICAN: 22 mL/min — AB (ref >60)
Glucose, Bld: 126 mg/dL — ABNORMAL HIGH (ref 65–99)
POTASSIUM: 3.6 mmol/L (ref 3.5–5.1)
Sodium: 137 mmol/L (ref 135–145)
TOTAL PROTEIN: 6.1 g/dL — AB (ref 6.5–8.1)
Total Bilirubin: 0.3 mg/dL (ref 0.3–1.2)

## 2014-12-28 LAB — URINE MICROSCOPIC-ADD ON: RBC / HPF: NONE SEEN RBC/hpf (ref 0–5)

## 2014-12-28 LAB — URINALYSIS, ROUTINE W REFLEX MICROSCOPIC
Bilirubin Urine: NEGATIVE
Glucose, UA: NEGATIVE mg/dL
Hgb urine dipstick: NEGATIVE
Ketones, ur: NEGATIVE mg/dL
NITRITE: NEGATIVE
PROTEIN: NEGATIVE mg/dL
SPECIFIC GRAVITY, URINE: 1.019 (ref 1.005–1.030)
pH: 5 (ref 5.0–8.0)

## 2014-12-28 LAB — I-STAT TROPONIN, ED: Troponin i, poc: 0 ng/mL (ref 0.00–0.08)

## 2014-12-28 MED ORDER — DEXTROSE 5 % IV SOLN
1.0000 g | Freq: Once | INTRAVENOUS | Status: AC
  Administered 2014-12-28: 1 g via INTRAVENOUS
  Filled 2014-12-28: qty 10

## 2014-12-28 MED ORDER — SODIUM CHLORIDE 0.9 % IV BOLUS (SEPSIS)
1000.0000 mL | Freq: Once | INTRAVENOUS | Status: AC
  Administered 2014-12-28: 1000 mL via INTRAVENOUS

## 2014-12-28 MED ORDER — CEPHALEXIN 500 MG PO CAPS: 500.0000 mg | ORAL_CAPSULE | Freq: Four times a day (QID) | ORAL | Status: DC

## 2014-12-28 NOTE — ED Notes (Addendum)
Pt presents via [facility] from home with daughter with syncopal episode vs. Seizure vs. Altered mental status.  Daughter unable to tell EMS what happened other than she started feeling bad after eating lunch.  Pt intially hypotensive with EMS, last BP-93/49.  Pt speech slurred but per daughter is normal, hx stroke with right side deficits and slurred speech.  Denies pain, SOB.  Per daughter pt acts like this when she has a UTI.  CBG-115 A x 4, NAD on arrival.

## 2014-12-28 NOTE — ED Provider Notes (Signed)
CSN: [id number]     Arrival date & time 12/28/14  1450 History   First MD Initiated Contact with Patient 12/28/14 1457     Chief Complaint  Patient presents with  . Altered Mental Status     (Consider location/radiation/quality/duration/timing/severity/associated sxs/prior Treatment) HPI  79 year old female brought in by EMS after near-syncope. Patient tells me that she was eating lunch and her daughter gave her some cough medicine. A few minutes after this she started feeling dizzy and felt like she was drunk. She had to go lay down because she was not feeling well. To her knowledge she did not actually pass out. Patient feels much better now and feels normal. Initial blood pressure with EMS was 93/49. Patient has chronic slurred speech since a stroke as well as chronic right-sided deficits. No new weakness. Patient denies headache, chest pain, shortness of breath, or abdominal pain. No urinary symptoms. Has a chronic cough that is not worse than normal. Patient states this is the first time she has tried this cough medicine. EMS noted that patient was "out of it" when they initially picked her up almost like she was post-ictal. Is not at normal baseline. Daughter told EMS that patient has presented like this when having a UTI in the past.  Past Medical History  Diagnosis Date  . Hypertension   . Stroke [facility]) May 2009    multiple, last one Nov 2012, 2 strokes with right sided deficits and slurred speech  . Arthritis     hands  . Depression   . Asthma   . Hypochromic anemia   . Hyperlipidemia   . Hx of adenomatous colonic polyps   . Diverticulosis   . Chronic kidney disease (CKD), stage III (moderate)   . Internal hemorrhoids    Past Surgical History  Procedure Laterality Date  . Vascular surgery    . Abdominal hysterectomy    . Other surgical history      2 ear surgeries  . Tonsillectomy    . Abdominal surgery    . Colectomy    . Subtotal colectomy  2010    for stricturing  from bouts of diverticulitis along with hx recurrent diverticular bleeds.  the surgery was a total abdominal colec  . Flexible sigmoidoscopy N/A 05/28/2012    Procedure: FLEXIBLE SIGMOIDOSCOPY;  Surgeon: [name], MD;  [facility];  Service: Endoscopy;  Laterality: N/A;  . Peripheral vascular catheterization N/A 07/21/2014    Procedure: Abdominal Aortogram w/Lower Extremity;  Surgeon: [name], MD;  Location: [facility] INVASIVE CV LAB;  Service: Cardiovascular;  Laterality: N/A;   Family History  Problem Relation Age of Onset  . Diabetes Paternal Aunt   . Colon cancer Neg Hx   . Stroke Father   . HIV Brother   . Heart disease Father   . Diabetes Father   . Prostate cancer    . Depression Father   . Cancer Sister     unknown type   Social History  Substance Use Topics  . Smoking status: Former Games developermoker  . Smokeless tobacco: Never Used  . Alcohol Use: No   OB History    No data available     Review of Systems  Constitutional: Negative for fever.  Respiratory: Positive for cough. Negative for shortness of breath.   Cardiovascular: Negative for chest pain.  Gastrointestinal: Negative for abdominal pain.  Genitourinary: Negative for dysuria.  Neurological: Positive for dizziness. Negative for weakness and numbness.  All other systems reviewed  and are negative.     Allergies  Aspirin  Home Medications   Prior to Admission medications   Medication Sig Start Date End Date Taking? Authorizing Provider  Cholecalciferol (VITAMIN D3) 1000 UNITS CAPS Take by mouth.    Historical Provider, MD  clopidogrel (PLAVIX) 75 MG tablet Take 75 mg by mouth daily.    Historical Provider, MD  donepezil (ARICEPT) 10 MG tablet Take 10 mg by mouth at bedtime.    Historical Provider, MD  hydrochlorothiazide (HYDRODIURIL) 25 MG tablet Take 25 mg by mouth daily.    Historical Provider, MD  hydrocortisone (ANUSOL-HC) 25 MG suppository Place 25 mg rectally 2 (two) times daily as needed  for hemorrhoids.     Historical Provider, MD  loratadine (CLARITIN) 10 MG tablet Take 10 mg by mouth daily as needed for allergies.     Historical Provider, MD  losartan (COZAAR) 25 MG tablet Take 25 mg by mouth daily.    Historical Provider, MD  magnesium hydroxide (MILK OF MAGNESIA) 400 MG/5ML suspension Take 15 mLs by mouth daily as needed for mild constipation. 02/10/13   Mellody Drown, PA-C  mirtazapine (REMERON) 15 MG tablet Take 15 mg by mouth at bedtime.    Historical Provider, MD  sennosides-docusate sodium (SENOKOT-S) 8.6-50 MG tablet Take 1 tablet by mouth daily as needed for constipation.     Historical Provider, MD  zolpidem (AMBIEN) 5 MG tablet Take 5 mg by mouth at bedtime as needed for sleep.    Historical Provider, MD   BP 102/63 mmHg  Pulse 65  Temp(Src) 97.8 F (36.6 C) (Oral)  Resp 18  SpO2 100% Physical Exam  Constitutional: She is oriented to person, place, and time. She appears well-developed and well-nourished. No distress.  HENT:  Head: Normocephalic and atraumatic.  Right Ear: External ear normal.  Left Ear: External ear normal.  Nose: Nose normal.  Eyes: EOM are normal. Pupils are equal, round, and reactive to light. Right eye exhibits no discharge. Left eye exhibits no discharge.  Neck: Neck supple.  Cardiovascular: Normal rate, regular rhythm and normal heart sounds.   No murmur heard. Pulmonary/Chest: Effort normal and breath sounds normal.  Abdominal: Soft. There is no tenderness.  Neurological: She is alert and oriented to person, place, and time.  CN 2-12 grossly intact besides slurred speech. Equal strength in all 4 extremities  Skin: Skin is warm and dry. She is not diaphoretic.  Nursing note and vitals reviewed.   ED Course  Procedures (including critical care time) Labs Review Labs Reviewed  COMPREHENSIVE METABOLIC PANEL - Abnormal; Notable for the following:    Glucose, Bld 126 (*)    BUN 27 (*)    Creatinine, Ser 2.09 (*)    Calcium 8.8  (*)    Total Protein 6.1 (*)    Albumin 3.2 (*)    GFR calc non Af Amer 22 (*)    GFR calc Af Amer 25 (*)    All other components within normal limits  CBC - Abnormal; Notable for the following:    RBC 3.78 (*)    Hemoglobin 10.4 (*)    HCT 32.7 (*)    All other components within normal limits  URINALYSIS, ROUTINE W REFLEX MICROSCOPIC (NOT AT [facility]) - Abnormal; Notable for the following:    APPearance HAZY (*)    Leukocytes, UA MODERATE (*)    All other components within normal limits  URINE MICROSCOPIC-ADD ON - Abnormal; Notable for the following:    Squamous  Epithelial / LPF 6-30 (*)    Bacteria, UA FEW (*)    Casts HYALINE CASTS (*)    All other components within normal limits  URINE CULTURE  DIFFERENTIAL  I-STAT TROPOININ, ED  CBG MONITORING, ED    Imaging Review Dg Chest 2 View  12/28/2014  CLINICAL DATA:  Syncopal event versus seizure activity with altered mental status EXAM: CHEST - 2 VIEW COMPARISON:  02/10/2013 FINDINGS: Cardiac shadow is within normal limits. Mild aortic calcifications are seen. The lungs are well aerated bilaterally. No focal infiltrate or sizable effusion is seen. No bony abnormality is noted. IMPRESSION: No acute abnormality noted. Electronically Signed   By: Alcide Clever M.D.   On: 12/28/2014 17:08   Ct Head Wo Contrast  12/28/2014  CLINICAL DATA:  Difficulty communicating EXAM: CT HEAD WITHOUT CONTRAST TECHNIQUE: Contiguous axial images were obtained from the base of the skull through the vertex without intravenous contrast. COMPARISON:  03/23/2012 FINDINGS: The bony calvarium is intact. No gross soft tissue abnormality is noted. Areas of prior multifocal infarct are again identified and stable. This is most marked in the distribution of the left middle cerebral artery. Changes of cavum septum pellucidum are noted. Mild atrophic changes are seen. No findings to suggest acute hemorrhage, acute infarction or space-occupying mass lesion are noted.  IMPRESSION: Stable chronic changes without acute abnormality. Electronically Signed   By: Alcide Clever M.D.   On: 12/28/2014 17:31   I have personally reviewed and evaluated these images and lab results as part of my medical decision-making.   EKG Interpretation   Date/Time:  Monday December 28 2014 15:00:23 EST Ventricular Rate:  69 PR Interval:  255 QRS Duration: 97 QT Interval:  436 QTC Calculation: 467 R Axis:   68 Text Interpretation:  Sinus rhythm Prolonged PR interval no significant  change since June 2016 Confirmed by Criss Alvine  MD, Christine Morton ([id number]) on  12/28/2014 3:04:09 PM      MDM   Final diagnoses:  UTI (lower urinary tract infection)    Patient has remained at her normal baseline for over 3 hours in the emergency department. I discussed with her daughter at the bedside who saw the patient the entire time and never noted any seizure-like activity. This could be related to the medication she was given versus a UTI seen on urinalysis. No signs of sepsis. Mildly increased creatinine from baseline but I feel that this does not need inpatient workup. Highly doubt stroke or TIA. Doubt this would be cardiogenic. Patient feels well here, plan to discharge home with antibiotics, increase fluid intake, and follow closely with PCP.    Pricilla Loveless, MD 12/28/14 [phone]

## 2014-12-28 NOTE — Discharge Instructions (Signed)
If you notice increased confusion, vomiting, fevers, pain, or other concerning findings, return to the ER immediately. Otherwise follow-up with the primary care physician in 2 days.

## 2014-12-29 LAB — URINE CULTURE

## 2015-02-15 ENCOUNTER — Encounter: Payer: Self-pay | Admitting: *Deleted

## 2015-02-15 DIAGNOSIS — Z006 Encounter for examination for normal comparison and control in clinical research program: Secondary | ICD-10-CM

## 2015-02-15 NOTE — Progress Notes (Signed)
Received a return phone call from Ms. Wince's daughter Angelique Blonder. Ms. Oberman was due for her SAFE-DCB 6 month follow-up phone call. Ms. Casebier has had no additional interventions to her left leg. Her daughter states she has been doing well. I will contact them again in 6 months to follow-up.

## 2015-03-04 ENCOUNTER — Encounter: Payer: Self-pay | Admitting: Family

## 2015-03-12 ENCOUNTER — Encounter (HOSPITAL_COMMUNITY): Payer: Medicare (Managed Care)

## 2015-03-12 ENCOUNTER — Ambulatory Visit: Payer: Medicare (Managed Care) | Admitting: Family

## 2015-03-15 ENCOUNTER — Encounter: Payer: Self-pay | Admitting: Family

## 2015-03-15 ENCOUNTER — Encounter (HOSPITAL_COMMUNITY): Payer: Medicare (Managed Care)

## 2015-03-15 ENCOUNTER — Ambulatory Visit (HOSPITAL_COMMUNITY)
Admission: RE | Admit: 2015-03-15 | Discharge: 2015-03-15 | Disposition: A | Discharge status: Home / Self Care | Payer: Medicare (Managed Care) | Source: Ambulatory Visit | Attending: Family | Admitting: Family

## 2015-03-15 ENCOUNTER — Ambulatory Visit: Payer: Medicare (Managed Care) | Admitting: Family

## 2015-03-15 ENCOUNTER — Ambulatory Visit (INDEPENDENT_AMBULATORY_CARE_PROVIDER_SITE_OTHER): Payer: Medicare (Managed Care) | Admitting: Family

## 2015-03-15 ENCOUNTER — Other Ambulatory Visit: Payer: Self-pay | Admitting: Surgery

## 2015-03-15 ENCOUNTER — Ambulatory Visit (INDEPENDENT_AMBULATORY_CARE_PROVIDER_SITE_OTHER)
Admission: RE | Admit: 2015-03-15 | Discharge: 2015-03-15 | Disposition: A | Discharge status: Home / Self Care | Payer: Medicare (Managed Care) | Source: Ambulatory Visit | Attending: Family | Admitting: Family

## 2015-03-15 VITALS — BP 116/62 | HR 71 | Temp 98.0°F | Resp 14 | Ht 66.0 in | Wt 134.0 lb

## 2015-03-15 DIAGNOSIS — I70299 Other atherosclerosis of native arteries of extremities, unspecified extremity: Secondary | ICD-10-CM

## 2015-03-15 DIAGNOSIS — I739 Peripheral vascular disease, unspecified: Secondary | ICD-10-CM | POA: Diagnosis not present

## 2015-03-15 DIAGNOSIS — E785 Hyperlipidemia, unspecified: Secondary | ICD-10-CM | POA: Insufficient documentation

## 2015-03-15 DIAGNOSIS — I129 Hypertensive chronic kidney disease with stage 1 through stage 4 chronic kidney disease, or unspecified chronic kidney disease: Secondary | ICD-10-CM | POA: Insufficient documentation

## 2015-03-15 DIAGNOSIS — Z4889 Encounter for other specified surgical aftercare: Secondary | ICD-10-CM

## 2015-03-15 DIAGNOSIS — Z959 Presence of cardiac and vascular implant and graft, unspecified: Secondary | ICD-10-CM | POA: Diagnosis not present

## 2015-03-15 DIAGNOSIS — Z48812 Encounter for surgical aftercare following surgery on the circulatory system: Secondary | ICD-10-CM

## 2015-03-15 DIAGNOSIS — L97909 Non-pressure chronic ulcer of unspecified part of unspecified lower leg with unspecified severity: Secondary | ICD-10-CM

## 2015-03-15 DIAGNOSIS — N183 Chronic kidney disease, stage 3 (moderate): Secondary | ICD-10-CM | POA: Diagnosis not present

## 2015-03-15 DIAGNOSIS — R938 Abnormal findings on diagnostic imaging of other specified body structures: Secondary | ICD-10-CM | POA: Insufficient documentation

## 2015-03-15 NOTE — Progress Notes (Signed)
[facility] [place] HISTORY AND PHYSICAL -PAD  History of Present Illness [name] is a 80 y.o. female patient of Dr. Myra Gianotti returns today for follow-up. She initially presented with a left lateral malleolar ulcer. Arterial evaluation revealed diminished blood flow. On 07/21/2014 she underwent angiography. A 75% stenosis was identified within the left superficial femoral artery. She underwent atherectomy and drug coated balloon angioplasty, followed by stenting. Her procedure was uncomplicated.Her ulcer has completely healed. Anecdotally, she states the swelling in her left leg has improved. She does not have any pain. She had multiple strokes, documented in 2009 and 2012, pt's speech is difficult to understand, and she indicates that she has mild left side weakness.   She has a cane with her, does not seem to walk much to elicit claudication sx's. She indicates that she lives with her daughter, was brought by hired transportation today.   Pt Diabetic: No Pt smoker: former smoker  Pt meds include: Statin :No Betablocker: No ASA: No Other anticoagulants/antiplatelets: Plavix  Past Medical History  Diagnosis Date  . Hypertension   . Stroke [facility]) May 2009    multiple, last one Nov 2012, 2 strokes with right sided deficits and slurred speech  . Arthritis     hands  . Depression   . Asthma   . Hypochromic anemia   . Hyperlipidemia   . Hx of adenomatous colonic polyps   . Diverticulosis   . Chronic kidney disease (CKD), stage III (moderate)   . Internal hemorrhoids     Social History Social History  Substance Use Topics  . Smoking status: Former Games developer  . Smokeless tobacco: Never Used  . Alcohol Use: No    Family History Family History  Problem Relation Age of Onset  . Diabetes Paternal Aunt   . Colon cancer Neg Hx   . Stroke Father   . HIV Brother   . Heart disease Father   . Diabetes Father   . Prostate cancer    . Depression  Father   . Cancer Sister     unknown type    Past Surgical History  Procedure Laterality Date  . Vascular surgery    . Abdominal hysterectomy    . Other surgical history      2 ear surgeries  . Tonsillectomy    . Abdominal surgery    . Colectomy    . Subtotal colectomy  2010    for stricturing from bouts of diverticulitis along with hx recurrent diverticular bleeds.  the surgery was a total abdominal colec  . Flexible sigmoidoscopy N/A 05/28/2012    Procedure: FLEXIBLE SIGMOIDOSCOPY;  Surgeon: Hart Carwin, MD;  Location: [facility] ENDOSCOPY;  Service: Endoscopy;  Laterality: N/A;  . Peripheral vascular catheterization N/A 07/21/2014    Procedure: Abdominal Aortogram w/Lower Extremity;  Surgeon: Nada Libman, MD;  Location: [facility] INVASIVE CV LAB;  Service: Cardiovascular;  Laterality: N/A;    Allergies  Allergen Reactions  . Aspirin Nausea And Vomiting    Hurts stomach    Current Outpatient Prescriptions  Medication Sig Dispense Refill  . cephALEXin (KEFLEX) 500 MG capsule Take 1 capsule (500 mg total) by mouth 4 (four) times daily. 28 capsule 0  . Cholecalciferol (VITAMIN D3) 1000 UNITS CAPS Take by mouth.    . clopidogrel (PLAVIX) 75 MG tablet Take 75 mg by mouth daily.    Marland Kitchen donepezil (ARICEPT) 10 MG tablet Take 10 mg by mouth at bedtime.    . hydrochlorothiazide (HYDRODIURIL)  25 MG tablet Take 25 mg by mouth daily.    . hydrocortisone (ANUSOL-HC) 25 MG suppository Place 25 mg rectally 2 (two) times daily as needed for hemorrhoids.     Marland Kitchen loratadine (CLARITIN) 10 MG tablet Take 10 mg by mouth daily as needed for allergies.     Marland Kitchen losartan (COZAAR) 25 MG tablet Take 25 mg by mouth daily.    . magnesium hydroxide (MILK OF MAGNESIA) 400 MG/5ML suspension Take 15 mLs by mouth daily as needed for mild constipation. 360 mL 0  . mirtazapine (REMERON) 15 MG tablet Take 15 mg by mouth at bedtime.    . sennosides-docusate sodium (SENOKOT-S) 8.6-50 MG tablet Take 1 tablet by mouth daily as needed  for constipation.     Marland Kitchen zolpidem (AMBIEN) 5 MG tablet Take 5 mg by mouth at bedtime as needed for sleep.     No current facility-administered medications for this visit.    ROS: See HPI for pertinent positives and negatives.   Physical Examination  Filed Vitals:   03/15/15 1443  BP: 116/62  Pulse: 71  Temp: 98 F (36.7 C)  TempSrc: Oral  Resp: 14  Height:  (1.676 m)  Weight: 134 lb (60.782 kg)  SpO2: 100%   Body mass index is 21.64 kg/(m^2).  General: A&O x 3, WDWN. Gait: seated in wheelchair Eyes: PERRLA. Pulmonary: CTAB, without wheezes , rales or rhonchi. Cardiac: regular rhythm, no detected murmur.         Carotid Bruits Right Left   Negative Negative  Aorta is not palpable. Radial pulses: 1+ palpable                           VASCULAR EXAM: Extremities without ischemic changes, without Gangrene; without open wounds.                                                                                                          LE Pulses Right Left       FEMORAL  not palpable seated  not palpable seated        POPLITEAL  not palpable   not palpable       POSTERIOR TIBIAL  not palpable   not palpable        DORSALIS PEDIS      ANTERIOR TIBIAL not palpable  not palpable    Abdomen: soft, NT, no palpable masses. Skin: no rashes, no ulcers. Musculoskeletal: no muscle wasting or atrophy.  Neurologic: A&O X 3; Appropriate Affect ; SENSATION: normal; MOTOR FUNCTION:  moving all extremities equally, motor strength 4/5 throughout. Speech is difficult to understand. CN 2-12 intact.    Non-Invasive Vascular Imaging: DATE: 03/15/2015 ABI: RIGHT: does not correlate with monophasic waveforms, Waveforms: monophasic;  LEFT: does not correlate with monophasic waveforms, Waveforms: monophasic  DUPLEX SCAN OF BYPASS:  Mild diffuse disease in the native SFA inflow and popliteal outflow with no significant stenosis. All waveforms to the popliteal level are  triphasic.   ASSESSMENT: [name] is a  81 y.o. female who is s/p atherectomy and drug coated balloon angioplasty, followed by stenting, of the left superficial femoral artery on 07/21/2014. She does not seem to walk enough to elicit claudication sx's. She has had several strokes. Her left ankle ulcer has healed since the above procedure.  ABI's do not correspond to with tibial artery waveforms indicating medial calcification. ABI Waveforms today are monophasic compared to 08/25/14 which were biphasic.  All waveforms to the popliteal level are triphasic. Mild diffuse disease in the native SFA inflow and popliteal outflow with no significant stenosis.    PLAN:  Daily seated leg exercises as discussed and demonstrated.  Based on the patient's vascular studies and examination, pt will return to clinic in 6 months with ABI's and left LE arterial duplex.  I discussed in depth with the patient the nature of atherosclerosis, and emphasized the importance of maximal medical management including strict control of blood pressure, blood glucose, and lipid levels, obtaining regular exercise, and continued cessation of smoking.  The patient is aware that without maximal medical management the underlying atherosclerotic disease process will progress, limiting the benefit of any interventions.  The patient was given information about PAD including signs, symptoms, treatment, what symptoms should prompt the patient to seek immediate medical care, and risk reduction measures to take.  Charisse March, RN, MSN, FNP-C [facility] Phone: [phone]  Clinic MD: Hart Rochester  03/15/2015 2:59 PM

## 2015-03-15 NOTE — Patient Instructions (Signed)
Peripheral Vascular Disease Peripheral vascular disease (PVD) is a disease of the blood vessels that are not part of your heart and brain. A simple term for PVD is poor circulation. In most cases, PVD narrows the blood vessels that carry blood from your heart to the rest of your body. This can result in a decreased supply of blood to your arms, legs, and internal organs, like your stomach or kidneys. However, it most often affects a person's lower legs and feet. There are two types of PVD.  Organic PVD. This is the more common type. It is caused by damage to the structure of blood vessels.  Functional PVD. This is caused by conditions that make blood vessels contract and tighten (spasm). Without treatment, PVD tends to get worse over time. PVD can also lead to acute ischemic limb. This is when an arm or limb suddenly has trouble getting enough blood. This is a medical emergency. CAUSES Each type of PVD has many different causes. The most common cause of PVD is buildup of a fatty material (plaque) inside of your arteries (atherosclerosis). Small amounts of plaque can break off from the walls of the blood vessels and become lodged in a smaller artery. This blocks blood flow and can cause acute ischemic limb. Other common causes of PVD include:  Blood clots that form inside of blood vessels.  Injuries to blood vessels.  Diseases that cause inflammation of blood vessels or cause blood vessel spasms.  Health behaviors and health history that increase your risk of developing PVD. RISK FACTORS  You may have a greater risk of PVD if you:  Have a family history of PVD.  Have certain medical conditions, including:  High cholesterol.  Diabetes.  High blood pressure (hypertension).  Coronary heart disease.  Past problems with blood clots.  Past injury, such as burns or a broken bone. These may have damaged blood vessels in your limbs.  Buerger disease. This is caused by inflamed blood  vessels in your hands and feet.  Some forms of arthritis.  Rare birth defects that affect the arteries in your legs.  Use tobacco.  Do not get enough exercise.  Are obese.  Are age 50 or older. SIGNS AND SYMPTOMS  PVD may cause many different symptoms. Your symptoms depend on what part of your body is not getting enough blood. Some common signs and symptoms include:  Cramps in your lower legs. This may be a symptom of poor leg circulation (claudication).  Pain and weakness in your legs while you are physically active that goes away when you rest (intermittent claudication).  Leg pain when at rest.  Leg numbness, tingling, or weakness.  Coldness in a leg or foot, especially when compared with the other leg.  Skin or hair changes. These can include:  Hair loss.  Shiny skin.  Pale or bluish skin.  Thick toenails.  Inability to get or maintain an erection (erectile dysfunction). People with PVD are more prone to developing ulcers and sores on their toes, feet, or legs. These may take longer than normal to heal. DIAGNOSIS Your health care provider may diagnose PVD from your signs and symptoms. The health care provider will also do a physical exam. You may have tests to find out what is causing your PVD and determine its severity. Tests may include:  Blood pressure recordings from your arms and legs and measurements of the strength of your pulses (pulse volume recordings).  Imaging studies using sound waves to take pictures of   the blood flow through your blood vessels (Doppler ultrasound).  Injecting a dye into your blood vessels before having imaging studies using:  X-rays (angiogram or arteriogram).  Computer-generated X-rays (CT angiogram).  A powerful electromagnetic field and a computer (magnetic resonance angiogram or MRA). TREATMENT Treatment for PVD depends on the cause of your condition and the severity of your symptoms. It also depends on your age. Underlying  causes need to be treated and controlled. These include long-lasting (chronic) conditions, such as diabetes, high cholesterol, and high blood pressure. You may need to first try making lifestyle changes and taking medicines. Surgery may be needed if these do not work. Lifestyle changes may include:  Quitting smoking.  Exercising regularly.  Following a low-fat, low-cholesterol diet. Medicines may include:  Blood thinners to prevent blood clots.  Medicines to improve blood flow.  Medicines to improve your blood cholesterol levels. Surgical procedures may include:  A procedure that uses an inflated balloon to open a blocked artery and improve blood flow (angioplasty).  A procedure to put in a tube (stent) to keep a blocked artery open (stent implant).  Surgery to reroute blood flow around a blocked artery (peripheral bypass surgery).  Surgery to remove dead tissue from an infected wound on the affected limb.  Amputation. This is surgical removal of the affected limb. This may be necessary in cases of acute ischemic limb that are not improved through medical or surgical treatments. HOME CARE INSTRUCTIONS  Take medicines only as directed by your health care provider.  Do not use any tobacco products, including cigarettes, chewing tobacco, or electronic cigarettes. If you need help quitting, ask your health care provider.  Lose weight if you are overweight, and maintain a healthy weight as directed by your health care provider.  Eat a diet that is low in fat and cholesterol. If you need help, ask your health care provider.  Exercise regularly. Ask your health care provider to suggest some good activities for you.  Use compression stockings or other mechanical devices as directed by your health care provider.  Take good care of your feet.  Wear comfortable shoes that fit well.  Check your feet often for any cuts or sores. SEEK MEDICAL CARE IF:  You have cramps in your legs  while walking.  You have leg pain when you are at rest.  You have coldness in a leg or foot.  Your skin changes.  You have erectile dysfunction.  You have cuts or sores on your feet that are not healing. SEEK IMMEDIATE MEDICAL CARE IF:  Your arm or leg turns cold and blue.  Your arms or legs become red, warm, swollen, painful, or numb.  You have chest pain or trouble breathing.  You suddenly have weakness in your face, arm, or leg.  You become very confused or lose the ability to speak.  You suddenly have a very bad headache or lose your vision.   This information is not intended to replace advice given to you by your health care provider. Make sure you discuss any questions you have with your health care provider.   Document Released: 02/17/2004 Document Revised: 01/30/2014 Document Reviewed: 06/19/2013 Elsevier Interactive Patient Education 2016 [employer]  

## 2015-05-25 ENCOUNTER — Emergency Department (HOSPITAL_COMMUNITY)
Admission: EM | Admit: 2015-05-25 | Discharge: 2015-05-25 | Disposition: A | Discharge status: Home / Self Care | Payer: Medicare (Managed Care) | Attending: Emergency Medicine | Admitting: Emergency Medicine

## 2015-05-25 ENCOUNTER — Emergency Department (HOSPITAL_COMMUNITY): Payer: Medicare (Managed Care)

## 2015-05-25 ENCOUNTER — Encounter (HOSPITAL_COMMUNITY): Payer: Self-pay | Admitting: *Deleted

## 2015-05-25 DIAGNOSIS — Z862 Personal history of diseases of the blood and blood-forming organs and certain disorders involving the immune mechanism: Secondary | ICD-10-CM | POA: Diagnosis not present

## 2015-05-25 DIAGNOSIS — Z8673 Personal history of transient ischemic attack (TIA), and cerebral infarction without residual deficits: Secondary | ICD-10-CM | POA: Insufficient documentation

## 2015-05-25 DIAGNOSIS — Z87891 Personal history of nicotine dependence: Secondary | ICD-10-CM | POA: Insufficient documentation

## 2015-05-25 DIAGNOSIS — Z7902 Long term (current) use of antithrombotics/antiplatelets: Secondary | ICD-10-CM | POA: Diagnosis not present

## 2015-05-25 DIAGNOSIS — Z8639 Personal history of other endocrine, nutritional and metabolic disease: Secondary | ICD-10-CM | POA: Insufficient documentation

## 2015-05-25 DIAGNOSIS — J45909 Unspecified asthma, uncomplicated: Secondary | ICD-10-CM | POA: Diagnosis not present

## 2015-05-25 DIAGNOSIS — S0990XA Unspecified injury of head, initial encounter: Secondary | ICD-10-CM | POA: Insufficient documentation

## 2015-05-25 DIAGNOSIS — Y92009 Unspecified place in unspecified non-institutional (private) residence as the place of occurrence of the external cause: Secondary | ICD-10-CM | POA: Diagnosis not present

## 2015-05-25 DIAGNOSIS — M1990 Unspecified osteoarthritis, unspecified site: Secondary | ICD-10-CM | POA: Insufficient documentation

## 2015-05-25 DIAGNOSIS — W19XXXA Unspecified fall, initial encounter: Secondary | ICD-10-CM

## 2015-05-25 DIAGNOSIS — F329 Major depressive disorder, single episode, unspecified: Secondary | ICD-10-CM | POA: Insufficient documentation

## 2015-05-25 DIAGNOSIS — Z8719 Personal history of other diseases of the digestive system: Secondary | ICD-10-CM | POA: Insufficient documentation

## 2015-05-25 DIAGNOSIS — Z86010 Personal history of colonic polyps: Secondary | ICD-10-CM | POA: Diagnosis not present

## 2015-05-25 DIAGNOSIS — Y998 Other external cause status: Secondary | ICD-10-CM | POA: Diagnosis not present

## 2015-05-25 DIAGNOSIS — N183 Chronic kidney disease, stage 3 (moderate): Secondary | ICD-10-CM | POA: Insufficient documentation

## 2015-05-25 DIAGNOSIS — Y9389 Activity, other specified: Secondary | ICD-10-CM | POA: Diagnosis not present

## 2015-05-25 DIAGNOSIS — W1839XA Other fall on same level, initial encounter: Secondary | ICD-10-CM | POA: Insufficient documentation

## 2015-05-25 DIAGNOSIS — Z79899 Other long term (current) drug therapy: Secondary | ICD-10-CM | POA: Insufficient documentation

## 2015-05-25 DIAGNOSIS — Z792 Long term (current) use of antibiotics: Secondary | ICD-10-CM | POA: Insufficient documentation

## 2015-05-25 DIAGNOSIS — I129 Hypertensive chronic kidney disease with stage 1 through stage 4 chronic kidney disease, or unspecified chronic kidney disease: Secondary | ICD-10-CM | POA: Insufficient documentation

## 2015-05-25 NOTE — ED Notes (Signed)
Pt in via EMS after a fall from home, pt fell about an hour ago while trying to get into her closet, denies LOC, unsure if she hit her head, daughter concerned she hit her head on the door, pt alert and oriented to person and place which is baseline. Alert on arrival, c/o pain to her forehead, moves all extremities without issue.

## 2015-05-25 NOTE — ED Provider Notes (Signed)
Medical screening examination/treatment/procedure(s) were conducted as a shared visit with non-physician practitioner(s) and myself.  I personally evaluated the patient during the encounter.   EKG Interpretation None     Patient describes a mechanical fall. She did not have any pain related complaints. He was alert and pleasantly interactive. Patient's daughter later called and reported that she had a lot of problems with pain in her right foot. I readdress this with the patient. She clarified that her right toe off and hurt. She reports this preceded her fall and was not a direct injury. I did examine the feet and note that she has puffy edema bilateral feet approximately 2+ on the dorsum. She reports this is chronic in that area for foot does not hurt. She identified pain only of the great right toe. Objectively, no gross abnormality. Patient does have thickened onychomycosis of the nail. There is however no swelling of the toe or erythema. No evident injury. Toe can go through range of motion without difficulty. Patient was ambulatory by nursing staff. She ambulated at which she reported was her baseline with a walker.  Arby BarretteMarcy Tori Dattilio, MD 05/25/15 2156

## 2015-05-25 NOTE — ED Provider Notes (Signed)
CSN: [id number]     Arrival date & time 05/25/15  1828 History   First MD Initiated Contact with Patient 05/25/15 1836     Chief Complaint  Patient presents with  . Fall     (Consider location/radiation/quality/duration/timing/severity/associated sxs/prior Treatment) HPI Comments: Patient with history of stroke with stuttering speech -- presents after a fall approximately one hour prior to arrival. Patient was trying to get into her closet and she fell backwards striking the back of her head on the floor. Patient complains of pain over her right eyebrow. No neck pain. No chest pain or abdominal pain. No difficulty moving or tenderness in her extremities. No treatments prior to arrival. Patient does take Plavix. She denies feeling any lightheadedness or dizziness prior to falling. She did not black out. The onset of this condition was acute. The course is constant. Aggravating factors: none. Alleviating factors: none.    Patient is a 80 y.o. female presenting with fall. The history is provided by the patient.  Fall Associated symptoms include headaches (small area of tenderness above R eye). Pertinent negatives include no chest pain, fatigue, nausea, neck pain, numbness, vomiting or weakness.    Past Medical History  Diagnosis Date  . Hypertension   . Stroke [facility](HCC) May 2009    multiple, last one Nov 2012, 2 strokes with right sided deficits and slurred speech  . Arthritis     hands  . Depression   . Asthma   . Hypochromic anemia   . Hyperlipidemia   . Hx of adenomatous colonic polyps   . Diverticulosis   . Chronic kidney disease (CKD), stage III (moderate)   . Internal hemorrhoids    Past Surgical History  Procedure Laterality Date  . Vascular surgery    . Abdominal hysterectomy    . Other surgical history      2 ear surgeries  . Tonsillectomy    . Abdominal surgery    . Colectomy    . Subtotal colectomy  2010    for stricturing from bouts of diverticulitis along with hx  recurrent diverticular bleeds.  the surgery was a total abdominal colec  . Flexible sigmoidoscopy N/A 05/28/2012    Procedure: FLEXIBLE SIGMOIDOSCOPY;  Surgeon: [name], MD;  [facility];  Service: Endoscopy;  Laterality: N/A;  . Peripheral vascular catheterization N/A 07/21/2014    Procedure: Abdominal Aortogram w/Lower Extremity;  Surgeon: [name], MD;  Location: [facility] INVASIVE CV LAB;  Service: Cardiovascular;  Laterality: N/A;   Family History  Problem Relation Age of Onset  . Diabetes Paternal Aunt   . Colon cancer Neg Hx   . Stroke Father   . HIV Brother   . Heart disease Father   . Diabetes Father   . Prostate cancer    . Depression Father   . Cancer Sister     unknown type   Social History  Substance Use Topics  . Smoking status: Former Games developermoker  . Smokeless tobacco: Never Used  . Alcohol Use: No   OB History    No data available     Review of Systems  Constitutional: Negative for fatigue.  HENT: Negative for tinnitus.   Eyes: Negative for photophobia, pain and visual disturbance.  Respiratory: Negative for shortness of breath.   Cardiovascular: Negative for chest pain.  Gastrointestinal: Negative for nausea and vomiting.  Musculoskeletal: Negative for back pain, gait problem and neck pain.  Skin: Negative for wound.  Neurological: Positive for headaches (small area of  tenderness above R eye). Negative for dizziness, weakness, light-headedness and numbness.  Psychiatric/Behavioral: Negative for confusion and decreased concentration.      Allergies  Aspirin  Home Medications   Prior to Admission medications   Medication Sig Start Date End Date Taking? Authorizing Provider  cephALEXin (KEFLEX) 500 MG capsule Take 1 capsule (500 mg total) by mouth 4 (four) times daily. 12/28/14   Pricilla Loveless, MD  Cholecalciferol (VITAMIN D3) 1000 UNITS CAPS Take by mouth.    Historical Provider, MD  clopidogrel (PLAVIX) 75 MG tablet Take 75 mg by mouth  daily.    Historical Provider, MD  donepezil (ARICEPT) 10 MG tablet Take 10 mg by mouth at bedtime.    Historical Provider, MD  hydrochlorothiazide (HYDRODIURIL) 25 MG tablet Take 25 mg by mouth daily.    Historical Provider, MD  hydrocortisone (ANUSOL-HC) 25 MG suppository Place 25 mg rectally 2 (two) times daily as needed for hemorrhoids.     Historical Provider, MD  loratadine (CLARITIN) 10 MG tablet Take 10 mg by mouth daily as needed for allergies.     Historical Provider, MD  losartan (COZAAR) 25 MG tablet Take 25 mg by mouth daily.    Historical Provider, MD  magnesium hydroxide (MILK OF MAGNESIA) 400 MG/5ML suspension Take 15 mLs by mouth daily as needed for mild constipation. 02/10/13   Mellody Drown, PA-C  mirtazapine (REMERON) 15 MG tablet Take 15 mg by mouth at bedtime.    Historical Provider, MD  sennosides-docusate sodium (SENOKOT-S) 8.6-50 MG tablet Take 1 tablet by mouth daily as needed for constipation.     Historical Provider, MD  zolpidem (AMBIEN) 5 MG tablet Take 5 mg by mouth at bedtime as needed for sleep.    Historical Provider, MD   BP 164/87 mmHg  Pulse 75  Temp(Src) 98 F (36.7 C) (Oral)  Resp 20  SpO2 100% Physical Exam  Constitutional: She is oriented to person, place, and time. She appears well-developed and well-nourished.  HENT:  Head: Normocephalic and atraumatic. Head is without raccoon's eyes and without Battle's sign.  Right Ear: Tympanic membrane, external ear and ear canal normal. No hemotympanum.  Left Ear: Tympanic membrane, external ear and ear canal normal. No hemotympanum.  Nose: Nose normal. No nasal septal hematoma.  Mouth/Throat: Uvula is midline, oropharynx is clear and moist and mucous membranes are normal.  Eyes: Conjunctivae, EOM and lids are normal. Pupils are equal, round, and reactive to light. Right eye exhibits no nystagmus. Left eye exhibits no nystagmus.  No visible hyphema noted  Neck: Normal range of motion. Neck supple.   Cardiovascular: Normal rate and regular rhythm.   Pulmonary/Chest: Effort normal and breath sounds normal.  Abdominal: Soft. There is no tenderness.  Musculoskeletal:       Cervical back: She exhibits normal range of motion, no tenderness and no bony tenderness.       Thoracic back: She exhibits no tenderness and no bony tenderness.       Lumbar back: She exhibits no tenderness and no bony tenderness.  Neurological: She is alert and oriented to person, place, and time. She has normal strength and normal reflexes. No cranial nerve deficit or sensory deficit. Coordination normal. GCS eye subscore is 4. GCS verbal subscore is 5. GCS motor subscore is 6.  Skin: Skin is warm and dry.  Psychiatric: She has a normal mood and affect.  Nursing note and vitals reviewed.   ED Course  Procedures (including critical care time) Labs Review Labs Reviewed -  No data to display  Imaging Review Ct Head Wo Contrast  05/25/2015  CLINICAL DATA:  Recent fall hitting occipital region with pain, initial encounter EXAM: CT HEAD WITHOUT CONTRAST TECHNIQUE: Contiguous axial images were obtained from the base of the skull through the vertex without intravenous contrast. COMPARISON:  12/28/2014 FINDINGS: Bony calvarium is intact. No gross soft tissue abnormality is seen. Changes consistent with prior right parietal occipital infarct are again noted. Changes consistent with prior left middle cerebral artery infarct are seen. Some lesser prominent changes in the right middle cerebral artery distribution4. No acute infarct, acute hemorrhage or space-occupying mass lesion are noted. 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 IMPRESSION: Chronic ischemic changes without acute abnormality. Electronically Signed   By: Alcide Clever M.D.   On: 05/25/2015 20:03   Dg Foot Complete Right  05/25/2015  CLINICAL DATA:  Status post fall today with great toe pain EXAM: RIGHT FOOT COMPLETE - 3+ VIEW COMPARISON:  None.  FINDINGS: There is no evidence of fracture or dislocation. There is soft tissue swelling of the forefoot. Plantar calcaneal spur is identified. IMPRESSION: No acute fracture or dislocation. Electronically Signed   By: Sherian Rein M.D.   On: 05/25/2015 21:35   I have personally reviewed and evaluated these images and lab results as part of my medical decision-making.   6:46 PM Patient seen and examined. Work-up initiated. Pt is on Plavix.   Vital signs reviewed and are as follows: BP 164/87 mmHg  Pulse 75  Temp(Src) 98 F (36.7 C) (Oral)  Resp 20  SpO2 100%  8:00 PM Handoff to Dr. Donnald Garre at shift change who will see.    MDM   Final diagnoses:  Minor head injury, initial encounter  Fall, initial encounter   Patient s/p mechanical fall, pending completion of work-up.     Renne Crigler, PA-C 05/26/15 4782  Arby Barrette, MD 06/03/15 1029

## 2015-05-25 NOTE — ED Notes (Addendum)
Called daughter at home number to advise patient getting discharged.  Pts daughter asked if we assessed her leg because she was not walking well.  pts daughter reports pt walks without difficulty and without assist at home.

## 2015-05-25 NOTE — Discharge Instructions (Signed)
Please read and follow all provided instructions.  Your diagnoses today include:  1. Minor head injury, initial encounter   2. Fall, initial encounter     Tests performed today include:  CT scan of your head that did not show any serious injury.  Vital signs. See below for your results today.   Medications prescribed:   None  Take any prescribed medications only as directed.  Home care instructions:  Follow any educational materials contained in this packet.  Follow-up instructions: Please follow-up with your primary care provider in the next 3 days for further evaluation of your symptoms.   Return instructions:  SEEK IMMEDIATE MEDICAL ATTENTION IF:  There is confusion or drowsiness (although children frequently become drowsy after injury).   You cannot awaken the injured person.   You have more than one episode of vomiting.   You notice dizziness or unsteadiness which is getting worse, or inability to walk.   You have convulsions or unconsciousness.   You experience severe, persistent headaches not relieved by Tylenol.  You cannot use arms or legs normally.   There are changes in pupil sizes. (This is the black center in the colored part of the eye)   There is clear or bloody discharge from the nose or ears.   You have change in speech, vision, swallowing, or understanding.   Localized weakness, numbness, tingling, or change in bowel or bladder control.  You have any other emergent concerns.  Additional Information: You have had a head injury which does not appear to require admission at this time.  Your vital signs today were: BP 185/66 mmHg   Pulse 70   Temp(Src) 98 F (36.7 C) (Oral)   Resp 20   SpO2 100% If your blood pressure (BP) was elevated above 135/85 this visit, please have this repeated by your doctor within one month. --------------

## 2015-05-25 NOTE — ED Notes (Signed)
Patient transported to CT 

## 2015-08-19 ENCOUNTER — Encounter (HOSPITAL_COMMUNITY): Payer: Self-pay | Admitting: *Deleted

## 2015-08-19 ENCOUNTER — Emergency Department (HOSPITAL_COMMUNITY)
Admission: EM | Admit: 2015-08-19 | Discharge: 2015-08-19 | Disposition: A | Discharge status: Home / Self Care | Payer: Medicare (Managed Care) | Attending: Emergency Medicine | Admitting: Emergency Medicine

## 2015-08-19 ENCOUNTER — Emergency Department (HOSPITAL_COMMUNITY): Payer: Medicare (Managed Care)

## 2015-08-19 DIAGNOSIS — Z79899 Other long term (current) drug therapy: Secondary | ICD-10-CM | POA: Insufficient documentation

## 2015-08-19 DIAGNOSIS — Y999 Unspecified external cause status: Secondary | ICD-10-CM | POA: Insufficient documentation

## 2015-08-19 DIAGNOSIS — Y939 Activity, unspecified: Secondary | ICD-10-CM | POA: Insufficient documentation

## 2015-08-19 DIAGNOSIS — Y929 Unspecified place or not applicable: Secondary | ICD-10-CM | POA: Insufficient documentation

## 2015-08-19 DIAGNOSIS — J45909 Unspecified asthma, uncomplicated: Secondary | ICD-10-CM | POA: Diagnosis not present

## 2015-08-19 DIAGNOSIS — S40012A Contusion of left shoulder, initial encounter: Secondary | ICD-10-CM | POA: Diagnosis not present

## 2015-08-19 DIAGNOSIS — I129 Hypertensive chronic kidney disease with stage 1 through stage 4 chronic kidney disease, or unspecified chronic kidney disease: Secondary | ICD-10-CM | POA: Insufficient documentation

## 2015-08-19 DIAGNOSIS — Z87891 Personal history of nicotine dependence: Secondary | ICD-10-CM | POA: Diagnosis not present

## 2015-08-19 DIAGNOSIS — Z8673 Personal history of transient ischemic attack (TIA), and cerebral infarction without residual deficits: Secondary | ICD-10-CM | POA: Insufficient documentation

## 2015-08-19 DIAGNOSIS — N390 Urinary tract infection, site not specified: Secondary | ICD-10-CM | POA: Diagnosis not present

## 2015-08-19 DIAGNOSIS — N183 Chronic kidney disease, stage 3 (moderate): Secondary | ICD-10-CM | POA: Insufficient documentation

## 2015-08-19 DIAGNOSIS — S4992XA Unspecified injury of left shoulder and upper arm, initial encounter: Secondary | ICD-10-CM | POA: Diagnosis present

## 2015-08-19 DIAGNOSIS — W228XXA Striking against or struck by other objects, initial encounter: Secondary | ICD-10-CM | POA: Insufficient documentation

## 2015-08-19 LAB — URINE MICROSCOPIC-ADD ON

## 2015-08-19 LAB — URINALYSIS, ROUTINE W REFLEX MICROSCOPIC
Bilirubin Urine: NEGATIVE
Glucose, UA: NEGATIVE mg/dL
Ketones, ur: NEGATIVE mg/dL
Nitrite: NEGATIVE
Protein, ur: NEGATIVE mg/dL
Specific Gravity, Urine: 1.02 (ref 1.005–1.030)
pH: 6 (ref 5.0–8.0)

## 2015-08-19 MED ORDER — TRAMADOL HCL 50 MG PO TABS: 50.0000 mg | ORAL_TABLET | Freq: Four times a day (QID) | ORAL | 0 refills | Status: DC | PRN

## 2015-08-19 MED ORDER — CEPHALEXIN 500 MG PO CAPS: 500.0000 mg | ORAL_CAPSULE | Freq: Three times a day (TID) | ORAL | 0 refills | Status: DC

## 2015-08-19 MED ORDER — HYDROCODONE-ACETAMINOPHEN 5-325 MG PO TABS
1.0000 | ORAL_TABLET | Freq: Once | ORAL | Status: AC
  Administered 2015-08-19: 1 via ORAL
  Filled 2015-08-19: qty 1

## 2015-08-19 NOTE — ED Triage Notes (Signed)
Pt arrives via EMS from home. Pt states she fell trying to sit in a chair, states she fell and landed on her left shoulder. States she did not hit her head. No LOC. States she takes Plavix.

## 2015-08-19 NOTE — ED Notes (Addendum)
Pt daughter now here. Pt daughter requesting to take her mother home. Pt daughter states we have not done anything for her and she has been here since 4 pm. This RN informed the daughter that she just arrived on shift at 77. Daughter was updated on pt current status. Pt daughter then stated with an attitude "well this is what is about to happen. I'm about to take her home." Then the pt daughter stated "I'm about to go get her dressed." This RN responded "Okay. I will let the MD know you want to take her home." The daughter continued to stand at the desk. When the MD came out of the room he was informed that the pts daughter would like to take her home and that she was ready to go. The MD responded that he would go work on her paper work.  The daughter returned to the room and then returned to the desk requesting a wheelchair. This RN retrieved and rerturned to the room. This RN requested that the daughter give the MD a few minutes to work on the paperwork. "The daughter responded "I'm not waiting on the damn paperwork." This RN went into the room to the room and saw the pt leaning across the bed with just a bra with the side rail down. The RN turned around to close the curtain and the daughter responded "so you're just going to leave? You're not going to take that stuff off?" The daughter was informed that this was not the case and that the RN was shutting to curtain since her mother was not dressed."   The daughter continued to talk to this RN with an attitiude. The daughter then read this RNs badge and states "I can't wait until they call me for a check up I'm going to complain." This RN then responded "Ok. My name is Taleeya Blondin." This RN then proceeded to spell her name and the daughter responded "I know what cho name is. Did you forget that you're wearing a name tag." The daughter repeated this on several occasions. This RN responded "No, I just wanted to make sure you knew my name and how to spell it." The  daughter responded "I know how to spell Khristine Verno."   The RN finished dressing the pt and proceeded to roll the pt out. This RN waited for the daughter to arrive at the desk. Approx 5 minutes later the pt daughter came to the desk and registration personal asked "hey, where is her paperwork?" The RN informed registration that the daughter refused to wait on the paperwork. This RN verified that the daughter did now want the paperwork and the daughter responded "I got two prescriptions and I want them." This RN went to retrieve the paperwork.  Charge RN informed of situation.

## 2015-08-30 NOTE — ED Provider Notes (Signed)
[facility]-EMERGENCY DEPT Provider Note   CSN: [id number] Arrival date & time: 08/19/15  1609  First Provider Contact:  First MD Initiated Contact with Patient 08/19/15 1637        History   Chief Complaint Chief Complaint  Patient presents with  . Fall    HPI [name] is a 80 y.o. female.  HPI   80 year old female presenting after fall. Mechanical. She states that she was trying to get in a chair when she lost her balance and fell onto her left side. She does not think she hit her head. She is on Plavix. She denies any headaches neck or back pain. She has been having persistent pain in her left shoulder since then though.  Past Medical History:  Diagnosis Date  . Arthritis    hands  . Asthma   . Chronic kidney disease (CKD), stage III (moderate)   . Depression   . Diverticulosis   . Hx of adenomatous colonic polyps   . Hyperlipidemia   . Hypertension   . Hypochromic anemia   . Internal hemorrhoids   . Stroke [facility](HCC) May 2009   multiple, last one Nov 2012, 2 strokes with right sided deficits and slurred speech    Patient Active Problem List   Diagnosis Date Noted  . Chronic kidney disease 05/29/2012  . Skin ulcer, stage 2 (HCC) 05/29/2012  . Internal hemorrhoid, bleeding 05/29/2012  . Severe protein-calorie malnutrition (HCC) 03/24/2012  . Hyponatremia 03/23/2012  . Hypokalemia 03/23/2012  . Urinary tract infection 03/23/2012  . Physical deconditioning 03/23/2012  . CKD (chronic kidney disease) stage 3, GFR 30-59 ml/min 12/01/2010  . Encephalopathy 12/01/2010  . TIA (transient ischemic attack) 11/28/2010  . ASTHMA 08/25/2008  . COLONIC POLYPS, ADENOMATOUS, HX OF 08/25/2008  . HYPERLIPIDEMIA 08/17/2008  . Microcytic hypochromic anemia 08/17/2008  . HYPERTENSION 08/17/2008  . CVA history  08/17/2008  . DIVERTICULITIS, COLON 08/17/2008  . HYPERGLYCEMIA 08/17/2008    Past Surgical History:  Procedure Laterality Date  . ABDOMINAL HYSTERECTOMY    .  ABDOMINAL SURGERY    . COLECTOMY    . FLEXIBLE SIGMOIDOSCOPY N/A 05/28/2012   Procedure: FLEXIBLE SIGMOIDOSCOPY;  Surgeon: [name], MD;  [facility];  Service: Endoscopy;  Laterality: N/A;  . OTHER SURGICAL HISTORY     2 ear surgeries  . PERIPHERAL VASCULAR CATHETERIZATION N/A 07/21/2014   Procedure: Abdominal Aortogram w/Lower Extremity;  Surgeon: [name], MD;  Location: [facility] INVASIVE CV LAB;  Service: Cardiovascular;  Laterality: N/A;  . SUBTOTAL COLECTOMY  2010   for stricturing from bouts of diverticulitis along with hx recurrent diverticular bleeds.  the surgery was a total abdominal colec  . TONSILLECTOMY    . VASCULAR SURGERY      OB History    No data available       Home Medications    Prior to Admission medications   Medication Sig Start Date End Date Taking? Authorizing Provider  losartan (COZAAR) 25 MG tablet Take 25 mg by mouth daily.   Yes Historical Provider, MD  cephALEXin (KEFLEX) 500 MG capsule Take 1 capsule (500 mg total) by mouth 4 (four) times daily. 12/28/14   Pricilla LovelessScott Goldston, MD  cephALEXin (KEFLEX) 500 MG capsule Take 1 capsule (500 mg total) by mouth 3 (three) times daily. 08/19/15   Raeford RazorStephen Oveda Dadamo, MD  Cholecalciferol (VITAMIN D3) 1000 UNITS CAPS Take by mouth.    Historical Provider, MD  clopidogrel (PLAVIX) 75 MG tablet Take 75 mg by mouth  daily.    Historical Provider, MD  donepezil (ARICEPT) 10 MG tablet Take 10 mg by mouth at bedtime.    Historical Provider, MD  hydrochlorothiazide (HYDRODIURIL) 25 MG tablet Take 25 mg by mouth daily.    Historical Provider, MD  hydrocortisone (ANUSOL-HC) 25 MG suppository Place 25 mg rectally 2 (two) times daily as needed for hemorrhoids.     Historical Provider, MD  loratadine (CLARITIN) 10 MG tablet Take 10 mg by mouth daily as needed for allergies.     Historical Provider, MD  magnesium hydroxide (MILK OF MAGNESIA) 400 MG/5ML suspension Take 15 mLs by mouth daily as needed for mild constipation.  02/10/13   Mellody Drown, PA-C  mirtazapine (REMERON) 15 MG tablet Take 15 mg by mouth at bedtime.    Historical Provider, MD  sennosides-docusate sodium (SENOKOT-S) 8.6-50 MG tablet Take 1 tablet by mouth daily as needed for constipation.     Historical Provider, MD  traMADol (ULTRAM) 50 MG tablet Take 1 tablet (50 mg total) by mouth every 6 (six) hours as needed. 08/19/15   Raeford Razor, MD  zolpidem (AMBIEN) 5 MG tablet Take 5 mg by mouth at bedtime as needed for sleep.    Historical Provider, MD    Family History Family History  Problem Relation Age of Onset  . Diabetes Paternal Aunt   . Stroke Father   . Heart disease Father   . Diabetes Father   . Depression Father   . HIV Brother   . Prostate cancer    . Cancer Sister     unknown type  . Colon cancer Neg Hx     Social History Social History  Substance Use Topics  . Smoking status: Former Games developer  . Smokeless tobacco: Never Used  . Alcohol use No     Allergies   Aspirin   Review of Systems Review of Systems  All systems reviewed and negative, other than as noted in HPI.  Physical Exam Updated Vital Signs BP 158/67 (BP Location: Right Arm)   Pulse 71   Temp 98.4 F (36.9 C) (Oral)   Resp 14   SpO2 99%   Physical Exam  Constitutional: She appears well-developed and well-nourished. No distress.  HENT:  Head: Normocephalic and atraumatic.  Eyes: Conjunctivae are normal.  Neck: Neck supple.  Cardiovascular: Normal rate and regular rhythm.   No murmur heard. Pulmonary/Chest: Effort normal and breath sounds normal. No respiratory distress.  Abdominal: Soft. There is no tenderness.  Musculoskeletal: She exhibits no edema.  Mild tenderness diffusely left shoulder. No concerning skin changes or deformity. Able to range although some increased pain with external rotation and abduction. Can actively range though. Neurovascularly intact. No midline spinal tenderness.  Neurological: She is alert.  Skin: Skin is  warm and dry.  Psychiatric: She has a normal mood and affect.  Nursing note and vitals reviewed.    ED Treatments / Results  Labs (all labs ordered are listed, but only abnormal results are displayed) Labs Reviewed  URINALYSIS, ROUTINE W REFLEX MICROSCOPIC (NOT AT [facility]) - Abnormal; Notable for the following:       Result Value   APPearance HAZY (*)    Hgb urine dipstick SMALL (*)    Leukocytes, UA LARGE (*)    All other components within normal limits  URINE MICROSCOPIC-ADD ON - Abnormal; Notable for the following:    Squamous Epithelial / LPF 6-30 (*)    Bacteria, UA MANY (*)    All other components within  normal limits    EKG  EKG Interpretation  Date/Time:  Thursday August 19 2015 16:57:22 EDT Ventricular Rate:  70 PR Interval:    QRS Duration: 93 QT Interval:  440 QTC Calculation: 475 R Axis:   46 Text Interpretation:  Sinus rhythm Prolonged PR interval Abnormal R-wave progression, early transition Confirmed by Juleen China  MD, Jozee Hammer ([id number]) on 08/19/2015 7:20:10 PM Also confirmed by Juleen China  MD, Demetrice Combes [phone]), editor [place] CT, Jola Babinski [phone])  on 08/20/2015 9:42:36 AM       Radiology No results found.  Procedures Procedures (including critical care time)  Medications Ordered in ED Medications  HYDROcodone-acetaminophen (NORCO/VICODIN) 5-325 MG per tablet 1 tablet (1 tablet Oral Given 08/19/15 1658)     Initial Impression / Assessment and Plan / ED Course  I have reviewed the triage vital signs and the nursing notes.  Pertinent labs & imaging results that were available during my care of the patient were reviewed by me and considered in my medical decision making (see chart for details).  Clinical Course    80 year old female with shoulder pain after fall. Closed injury. Neurovascularly intact. Incidentally noted urinary tract infection. I feel she is appropriate for outpatient treatment.  Final Clinical Impressions(s) / ED Diagnoses   Final diagnoses:  Shoulder  contusion, left, initial encounter  UTI (lower urinary tract infection)    New Prescriptions Discharge Medication List as of 08/19/2015  8:16 PM    START taking these medications   Details  !! cephALEXin (KEFLEX) 500 MG capsule Take 1 capsule (500 mg total) by mouth 3 (three) times daily., Starting Thu 08/19/2015, Print    traMADol (ULTRAM) 50 MG tablet Take 1 tablet (50 mg total) by mouth every 6 (six) hours as needed., Starting Thu 08/19/2015, Print     !! - Potential duplicate medications found. Please discuss with provider.       Raeford Razor, MD 08/30/15 [phone]

## 2015-09-13 ENCOUNTER — Other Ambulatory Visit (HOSPITAL_COMMUNITY): Payer: Medicare (Managed Care)

## 2015-09-13 ENCOUNTER — Encounter (HOSPITAL_COMMUNITY): Payer: Medicare (Managed Care)

## 2015-09-13 ENCOUNTER — Ambulatory Visit: Payer: Medicare (Managed Care) | Admitting: Family

## 2015-11-02 ENCOUNTER — Encounter: Payer: Self-pay | Admitting: Family

## 2015-11-04 ENCOUNTER — Ambulatory Visit (HOSPITAL_COMMUNITY)
Admission: RE | Admit: 2015-11-04 | Discharge: 2015-11-04 | Disposition: A | Discharge status: Home / Self Care | Payer: Medicare (Managed Care) | Source: Ambulatory Visit | Attending: Vascular Surgery | Admitting: Vascular Surgery

## 2015-11-04 ENCOUNTER — Ambulatory Visit (INDEPENDENT_AMBULATORY_CARE_PROVIDER_SITE_OTHER)
Admission: RE | Admit: 2015-11-04 | Discharge: 2015-11-04 | Disposition: A | Discharge status: Home / Self Care | Payer: Medicare (Managed Care) | Source: Ambulatory Visit | Attending: Vascular Surgery | Admitting: Vascular Surgery

## 2015-11-04 ENCOUNTER — Ambulatory Visit (INDEPENDENT_AMBULATORY_CARE_PROVIDER_SITE_OTHER): Payer: Medicare (Managed Care) | Admitting: Family

## 2015-11-04 ENCOUNTER — Encounter: Payer: Self-pay | Admitting: Family

## 2015-11-04 VITALS — BP 129/60 | HR 70 | Temp 97.0°F | Resp 14 | Ht 66.0 in | Wt 132.0 lb

## 2015-11-04 DIAGNOSIS — L97909 Non-pressure chronic ulcer of unspecified part of unspecified lower leg with unspecified severity: Secondary | ICD-10-CM

## 2015-11-04 DIAGNOSIS — Z87891 Personal history of nicotine dependence: Secondary | ICD-10-CM | POA: Diagnosis not present

## 2015-11-04 DIAGNOSIS — Z959 Presence of cardiac and vascular implant and graft, unspecified: Secondary | ICD-10-CM

## 2015-11-04 DIAGNOSIS — I70299 Other atherosclerosis of native arteries of extremities, unspecified extremity: Secondary | ICD-10-CM

## 2015-11-04 DIAGNOSIS — R9439 Abnormal result of other cardiovascular function study: Secondary | ICD-10-CM | POA: Diagnosis not present

## 2015-11-04 LAB — VAS US LOWER EXTREMITY ARTERIAL DUPLEX: RSFPPSV: 132 cm/s

## 2015-11-04 NOTE — Patient Instructions (Signed)
Peripheral Vascular Disease Peripheral vascular disease (PVD) is a disease of the blood vessels that are not part of your heart and brain. A simple term for PVD is poor circulation. In most cases, PVD narrows the blood vessels that carry blood from your heart to the rest of your body. This can result in a decreased supply of blood to your arms, legs, and internal organs, like your stomach or kidneys. However, it most often affects a person's lower legs and feet. There are two types of PVD.  Organic PVD. This is the more common type. It is caused by damage to the structure of blood vessels.  Functional PVD. This is caused by conditions that make blood vessels contract and tighten (spasm). Without treatment, PVD tends to get worse over time. PVD can also lead to acute ischemic limb. This is when an arm or limb suddenly has trouble getting enough blood. This is a medical emergency. CAUSES Each type of PVD has many different causes. The most common cause of PVD is buildup of a fatty material (plaque) inside of your arteries (atherosclerosis). Small amounts of plaque can break off from the walls of the blood vessels and become lodged in a smaller artery. This blocks blood flow and can cause acute ischemic limb. Other common causes of PVD include:  Blood clots that form inside of blood vessels.  Injuries to blood vessels.  Diseases that cause inflammation of blood vessels or cause blood vessel spasms.  Health behaviors and health history that increase your risk of developing PVD. RISK FACTORS  You may have a greater risk of PVD if you:  Have a family history of PVD.  Have certain medical conditions, including:  High cholesterol.  Diabetes.  High blood pressure (hypertension).  Coronary heart disease.  Past problems with blood clots.  Past injury, such as burns or a broken bone. These may have damaged blood vessels in your limbs.  Buerger disease. This is caused by inflamed blood  vessels in your hands and feet.  Some forms of arthritis.  Rare birth defects that affect the arteries in your legs.  Use tobacco.  Do not get enough exercise.  Are obese.  Are age 50 or older. SIGNS AND SYMPTOMS  PVD may cause many different symptoms. Your symptoms depend on what part of your body is not getting enough blood. Some common signs and symptoms include:  Cramps in your lower legs. This may be a symptom of poor leg circulation (claudication).  Pain and weakness in your legs while you are physically active that goes away when you rest (intermittent claudication).  Leg pain when at rest.  Leg numbness, tingling, or weakness.  Coldness in a leg or foot, especially when compared with the other leg.  Skin or hair changes. These can include:  Hair loss.  Shiny skin.  Pale or bluish skin.  Thick toenails.  Inability to get or maintain an erection (erectile dysfunction). People with PVD are more prone to developing ulcers and sores on their toes, feet, or legs. These may take longer than normal to heal. DIAGNOSIS Your health care provider may diagnose PVD from your signs and symptoms. The health care provider will also do a physical exam. You may have tests to find out what is causing your PVD and determine its severity. Tests may include:  Blood pressure recordings from your arms and legs and measurements of the strength of your pulses (pulse volume recordings).  Imaging studies using sound waves to take pictures of   the blood flow through your blood vessels (Doppler ultrasound).  Injecting a dye into your blood vessels before having imaging studies using:  X-rays (angiogram or arteriogram).  Computer-generated X-rays (CT angiogram).  A powerful electromagnetic field and a computer (magnetic resonance angiogram or MRA). TREATMENT Treatment for PVD depends on the cause of your condition and the severity of your symptoms. It also depends on your age. Underlying  causes need to be treated and controlled. These include long-lasting (chronic) conditions, such as diabetes, high cholesterol, and high blood pressure. You may need to first try making lifestyle changes and taking medicines. Surgery may be needed if these do not work. Lifestyle changes may include:  Quitting smoking.  Exercising regularly.  Following a low-fat, low-cholesterol diet. Medicines may include:  Blood thinners to prevent blood clots.  Medicines to improve blood flow.  Medicines to improve your blood cholesterol levels. Surgical procedures may include:  A procedure that uses an inflated balloon to open a blocked artery and improve blood flow (angioplasty).  A procedure to put in a tube (stent) to keep a blocked artery open (stent implant).  Surgery to reroute blood flow around a blocked artery (peripheral bypass surgery).  Surgery to remove dead tissue from an infected wound on the affected limb.  Amputation. This is surgical removal of the affected limb. This may be necessary in cases of acute ischemic limb that are not improved through medical or surgical treatments. HOME CARE INSTRUCTIONS  Take medicines only as directed by your health care provider.  Do not use any tobacco products, including cigarettes, chewing tobacco, or electronic cigarettes. If you need help quitting, ask your health care provider.  Lose weight if you are overweight, and maintain a healthy weight as directed by your health care provider.  Eat a diet that is low in fat and cholesterol. If you need help, ask your health care provider.  Exercise regularly. Ask your health care provider to suggest some good activities for you.  Use compression stockings or other mechanical devices as directed by your health care provider.  Take good care of your feet.  Wear comfortable shoes that fit well.  Check your feet often for any cuts or sores. SEEK MEDICAL CARE IF:  You have cramps in your legs  while walking.  You have leg pain when you are at rest.  You have coldness in a leg or foot.  Your skin changes.  You have erectile dysfunction.  You have cuts or sores on your feet that are not healing. SEEK IMMEDIATE MEDICAL CARE IF:  Your arm or leg turns cold and blue.  Your arms or legs become red, warm, swollen, painful, or numb.  You have chest pain or trouble breathing.  You suddenly have weakness in your face, arm, or leg.  You become very confused or lose the ability to speak.  You suddenly have a very bad headache or lose your vision.   This information is not intended to replace advice given to you by your health care provider. Make sure you discuss any questions you have with your health care provider.   Document Released: 02/17/2004 Document Revised: 01/30/2014 Document Reviewed: 06/19/2013 Elsevier Interactive Patient Education 2016 [employer]  

## 2015-11-04 NOTE — Progress Notes (Signed)
VASCULAR & VEIN SPECIALISTS OF [place]   CC: Follow up peripheral artery occlusive disease  History of Present Illness [name] is a 80 y.o. female patient of Dr. Myra GianottiBrabham returns today for follow-up. She initially presented with a left lateral malleolar ulcer. Arterial evaluation revealed diminished blood flow. On 07/21/2014 she underwent angiography. A 75% stenosis was identified within the left superficial femoral artery. She underwent atherectomy and drug coated balloon angioplasty, followed by stenting. Her procedure was uncomplicated.Her ulcer has completely healed. Anecdotally, she states the swelling in her left leg has improved. She does not have any pain. She had multiple strokes, documented in 2009 and 2012, pt's speech is difficult to understand, and she indicates that she has mild left side weakness.   She has a walker with her, does not seem to walk much to elicit claudication sx's. She indicates that she lives with her daughter, was brought by hired transportation today.  She reports painful left lateral malleolus for about a year, no open wound.  Pt Diabetic: No Pt smoker: former smoker  Pt meds include: Statin :No Betablocker: No ASA: No Other anticoagulants/antiplatelets: Plavix    Past Medical History:  Diagnosis Date  . Arthritis    hands  . Asthma   . Chronic kidney disease (CKD), stage III (moderate)   . Depression   . Diverticulosis   . Hx of adenomatous colonic polyps   . Hyperlipidemia   . Hypertension   . Hypochromic anemia   . Internal hemorrhoids   . Stroke [facility]: [facility]onda W(HCC) May 2009   multiple, last one Nov 2012, 2 strokes with right sided deficits and slurred speech    Social History Social History  Substance Use Topics  . Smoking status: Former Games developermoker  . Smokeless tobacco: Never Used  . Alcohol use No    Family History Family History  Problem Relation Age of Onset  . Diabetes Paternal Aunt   . Stroke Father   . Heart  disease Father   . Diabetes Father   . Depression Father   . HIV Brother   . Prostate cancer    . Cancer Sister     unknown type  . Colon cancer Neg Hx     Past Surgical History:  Procedure Laterality Date  . ABDOMINAL HYSTERECTOMY    . ABDOMINAL SURGERY    . COLECTOMY    . FLEXIBLE SIGMOIDOSCOPY N/A 05/28/2012   Procedure: FLEXIBLE SIGMOIDOSCOPY;  Surgeon: [name], MD;  [facility];  Service: Endoscopy;  Laterality: N/A;  . OTHER SURGICAL HISTORY     2 ear surgeries  . PERIPHERAL VASCULAR CATHETERIZATION N/A 07/21/2014   Procedure: Abdominal Aortogram w/Lower Extremity;  Surgeon: [name], MD;  Location: [facility] INVASIVE CV LAB;  Service: Cardiovascular;  Laterality: N/A;  . SUBTOTAL COLECTOMY  2010   for stricturing from bouts of diverticulitis along with hx recurrent diverticular bleeds.  the surgery was a total abdominal colec  . TONSILLECTOMY    . VASCULAR SURGERY      Allergies  Allergen Reactions  . Aspirin Nausea And Vomiting    Hurts stomach    Current Outpatient Prescriptions  Medication Sig Dispense Refill  . Cholecalciferol (VITAMIN D3) 1000 UNITS CAPS Take by mouth.    . clopidogrel (PLAVIX) 75 MG tablet Take 75 mg by mouth daily.    Marland Kitchen. donepezil (ARICEPT) 10 MG tablet Take 10 mg by mouth at bedtime.    . hydrochlorothiazide (HYDRODIURIL) 25 MG tablet Take 25 mg by mouth  daily.    . hydrocortisone (ANUSOL-HC) 25 MG suppository Place 25 mg rectally 2 (two) times daily as needed for hemorrhoids.     Marland Kitchen loratadine (CLARITIN) 10 MG tablet Take 10 mg by mouth daily as needed for allergies.     Marland Kitchen losartan (COZAAR) 25 MG tablet Take 25 mg by mouth daily.    . magnesium hydroxide (MILK OF MAGNESIA) 400 MG/5ML suspension Take 15 mLs by mouth daily as needed for mild constipation. 360 mL 0  . mirtazapine (REMERON) 15 MG tablet Take 15 mg by mouth at bedtime.    . sennosides-docusate sodium (SENOKOT-S) 8.6-50 MG tablet Take 1 tablet by mouth daily as needed  for constipation.     Marland Kitchen zolpidem (AMBIEN) 5 MG tablet Take 5 mg by mouth at bedtime as needed for sleep.    . cephALEXin (KEFLEX) 500 MG capsule Take 1 capsule (500 mg total) by mouth 4 (four) times daily. (Patient not taking: Reported on 11/04/2015) 28 capsule 0  . cephALEXin (KEFLEX) 500 MG capsule Take 1 capsule (500 mg total) by mouth 3 (three) times daily. (Patient not taking: Reported on 11/04/2015) 12 capsule 0  . traMADol (ULTRAM) 50 MG tablet Take 1 tablet (50 mg total) by mouth every 6 (six) hours as needed. (Patient not taking: Reported on 11/04/2015) 8 tablet 0   No current facility-administered medications for this visit.     ROS: See HPI for pertinent positives and negatives.   Physical Examination  Vitals:   11/04/15 1134  BP: 129/60  Pulse: 70  Resp: 14  Temp: 97 F (36.1 C)  SpO2: 100%  Weight: 132 lb (59.9 kg)  Height: 5\' 6"  (1.676 m)   Body mass index is 21.31 kg/m.  General: A&O x 3, WDWN. Gait: steady, using walker Eyes: PERRLA. Pulmonary: CTAB, without wheezes , rales or rhonchi. Cardiac: regular rhythm and rate, no detected murmur.         Carotid Bruits Right Left   Negative Negative  Aorta is not palpable. Radial pulses: 1+ palpable                           VASCULAR EXAM: Extremities without ischemic changes, without Gangrene; without open wounds. Lateral malleolus of both ankles with circular areas of skin that are more smooth than the surrounding skin. Left lateral malleolus is painful to touch.                                                                                                                                                       LE Pulses Right Left       FEMORAL  not palpable seated  not palpable seated       POPLITEAL  not palpable  not palpable  POSTERIOR TIBIAL  not palpable  not palpable       DORSALIS PEDIS      ANTERIOR TIBIAL not palpable not palpable   Abdomen: soft, NT, no palpable masses. Skin: no  rashes, no ulcers. See Extremities.  Musculoskeletal: no muscle wasting or atrophy.         Neurologic: A&O X 3; Appropriate Affect ; SENSATION: normal; MOTOR FUNCTION:  moving all extremities equally, motor strength 4/5 throughout. Speech is difficult to understand. CN 2-12 intact.   ASSESSMENT: ARRIA NAIM is a 80 y.o. female who is s/p atherectomy and drug coated balloon angioplasty, followed by stenting, of the left superficial femoral artery on 07/21/2014. She does not seem to walk enough to elicit claudication sx's. She indicates that she has been performing seated leg exercises.  She has had several strokes. Her left ankle ulcer has healed since the above procedure, but she reports pain at left lateral malleolus for about a year, not reported at her visit in February 2017. Lateral malleolus of both ankles with circular areas of skin that are more smooth than the surrounding skin. Left lateral malleolus is painful to touch.  Fortunately she does not have DM and no longer smokes. She is not taking a statin nor ASA, but does take Plavix.   DATA ABI's do not correspond to tibial artery waveforms indicating medial calcification. ABI Waveforms today are monophasic compared to 08/25/14 which were biphasic.  All waveforms to the popliteal level are triphasic. Mild diffuse disease in the native SFA inflow and popliteal outflow to stent with no significant stenosis.  No significant change compared to exam on 03/15/15; however, both TBI's have improved: rightt improved to 0.47 from 0.27, right improved to 0.43 from 0.28.    PLAN:  Daily seated leg exercises discussed and demonstrated.   Based on the patient's vascular studies and examination, pt will return to clinic on 11/15/15, see Dr. Myra Gianotti to evaluate sore feeling in left lateral malleolus, no open wound.  I discussed in depth with the patient the nature of atherosclerosis, and emphasized the importance of maximal medical  management including strict control of blood pressure, blood glucose, and lipid levels, obtaining regular exercise, and continued cessation of smoking.  The patient is aware that without maximal medical management the underlying atherosclerotic disease process will progress, limiting the benefit of any interventions.  The patient was given information about PAD including signs, symptoms, treatment, what symptoms should prompt the patient to seek immediate medical care, and risk reduction measures to take.  Charisse March, RN, MSN, FNP-C [facility] [place] Phone: [phone]  Clinic MD: [facility]  11/04/15 11:39 AM

## 2015-11-11 ENCOUNTER — Encounter: Payer: Self-pay | Admitting: Surgery

## 2015-11-15 ENCOUNTER — Encounter: Payer: Self-pay | Admitting: Surgery

## 2015-11-15 ENCOUNTER — Other Ambulatory Visit: Payer: Self-pay

## 2015-11-15 ENCOUNTER — Ambulatory Visit (INDEPENDENT_AMBULATORY_CARE_PROVIDER_SITE_OTHER): Payer: Medicare (Managed Care) | Admitting: Surgery

## 2015-11-15 ENCOUNTER — Ambulatory Visit: Payer: Medicare (Managed Care) | Admitting: Surgery

## 2015-11-15 VITALS — BP 126/68 | HR 66 | Temp 97.2°F | Resp 14 | Ht 65.0 in | Wt 134.0 lb

## 2015-11-15 DIAGNOSIS — I70212 Atherosclerosis of native arteries of extremities with intermittent claudication, left leg: Secondary | ICD-10-CM

## 2015-11-15 NOTE — Progress Notes (Signed)
[facility]  Patient name: Emma NakayamaSarah M Tyler MRN: [id number] DOB: [birth year] Sex: female  REASON FOR VISIT: wound left leg  HPI: The patient is here today for follow-up.  She initially presented with a left lateral malleolar ulcer.  Arterial evaluation revealed diminished blood flow.  On 07/21/2014 she underwent angiography.  A 75% stenosis was identified within the left superficial femoral artery.  She underwent atherectomy and drug coated balloon angioplasty, followed by stenting.  Her procedure was uncomplicated  She has a new painful left lateral malleolus area with a scab over top.  It has been present for approximately a year.  There is a similar area on the right but not as big.  She had multiple strokes, documented in 2009 and 2012, pt's speech is difficult to understand, and she indicates that she has mild left side weakness.   She has a walker with her, does not seem to walk much to elicit claudication sx's.  Past Medical History:  Diagnosis Date  . Arthritis    hands  . Asthma   . Chronic kidney disease (CKD), stage III (moderate)   . Depression   . Diverticulosis   . Hx of adenomatous colonic polyps   . Hyperlipidemia   . Hypertension   . Hypochromic anemia   . Internal hemorrhoids   . Stroke [facility](HCC) May 2009   multiple, last one Nov 2012, 2 strokes with right sided deficits and slurred speech    Family History  Problem Relation Age of Onset  . Diabetes Paternal Aunt   . Stroke Father   . Heart disease Father   . Diabetes Father   . Depression Father   . HIV Brother   . Prostate cancer    . Cancer Sister     unknown type  . Colon cancer Neg Hx     SOCIAL HISTORY: Social History  Substance Use Topics  . Smoking status: Former Games developermoker  . Smokeless tobacco: Never Used  . Alcohol use No    Allergies  Allergen Reactions  . Aspirin Nausea And Vomiting    Hurts stomach    Current  Outpatient Prescriptions  Medication Sig Dispense Refill  . acetaminophen (TYLENOL) 325 MG tablet Take 650 mg by mouth every 6 (six) hours as needed.    Marland Kitchen. atorvastatin (LIPITOR) 40 MG tablet Take 40 mg by mouth daily.    . Cholecalciferol (VITAMIN D3) 1000 UNITS CAPS Take by mouth.    . clopidogrel (PLAVIX) 75 MG tablet Take 75 mg by mouth daily.    . hydrocortisone (ANUSOL-HC) 25 MG suppository Place 25 mg rectally 2 (two) times daily as needed for hemorrhoids.     Marland Kitchen. loratadine (CLARITIN) 10 MG tablet Take 10 mg by mouth daily as needed for allergies.     Marland Kitchen. losartan-hydrochlorothiazide (HYZAAR) 50-12.5 MG tablet Take 1 tablet by mouth daily.    . magnesium hydroxide (MILK OF MAGNESIA) 400 MG/5ML suspension Take 15 mLs by mouth daily as needed for mild constipation. 360 mL 0  . zolpidem (AMBIEN) 5 MG tablet Take 5 mg by mouth at bedtime as needed for sleep.    Marland Kitchen. donepezil (ARICEPT) 10 MG tablet Take 10 mg by mouth at bedtime.    . sennosides-docusate sodium (SENOKOT-S) 8.6-50 MG tablet Take 1 tablet by mouth daily as needed for constipation.      No current facility-administered medications for this visit.     REVIEW OF SYSTEMS:  [X]  denotes positive finding, [ ]   denotes negative finding Cardiac  Comments:  Chest pain or chest pressure:    Shortness of breath upon exertion:    Short of breath when lying flat:    Irregular heart rhythm:        Vascular    Pain in calf, thigh, or hip brought on by ambulation: x   Pain in feet at night that wakes you up from your sleep:     Blood clot in your veins:    Leg swelling:         Pulmonary    Oxygen at home:    Productive cough:     Wheezing:         Neurologic    Sudden weakness in arms or legs:     Sudden numbness in arms or legs:     Sudden onset of difficulty speaking or slurred speech: x   Temporary loss of vision in one eye:     Problems with dizziness:         Gastrointestinal    Blood in stool:     Vomited blood:           Genitourinary    Burning when urinating:     Blood in urine:        Psychiatric    Major depression:         Hematologic    Bleeding problems:    Problems with blood clotting too easily:        Skin    Rashes or ulcers: x       Constitutional    Fever or chills:      PHYSICAL EXAM: Vitals:   11/15/15 1050  BP: 126/68  Pulse: 66  Resp: 14  Temp: 97.2 F (36.2 C)  TempSrc: Oral  SpO2: 95%  Weight: 134 lb (60.8 kg)  Height: 5\' 5"  (1.651 m)    GENERAL: The patient is a well-nourished female, in no acute distress. The vital signs are documented above. CARDIAC: There is a regular rate and rhythm.  VASCULAR: Nonpalpable pedal pulses PULMONARY: There is good air exchange bilaterally without wheezing or rales. MUSCULOSKELETAL: There are no major deformities or cyanosis. NEUROLOGIC: No focal weakness or paresthesias are detected. SKIN: Dry eschar on the lateral malleolus of the left leg.  Approximate 1 cm in diameter no erythema. PSYCHIATRIC: The patient has a normal affect.  DATA:  Recent duplex suggests patent superficial femoral and popliteal artery intervention with triphasic waveforms.  At the ankle, the patient has monophasic waveforms  MEDICAL ISSUES: Left lateral malleolus ulcer: The ultrasound the patient had suggested disease distal to the previously treated superficial femoral and popliteal artery.  An attempt for limb salvage of think we need to proceed with aortogram with runoff of both legs, with access in the right groin.  Intervention on the left leg will be performed if indicated.  This is been scheduled for Tuesday, October 31.    Durene CalWells Brabham, MD [facility] Tel [phone] Pager [phone]

## 2015-11-23 ENCOUNTER — Encounter (HOSPITAL_COMMUNITY)
Admission: RE | Disposition: A | Discharge status: Home / Self Care | Payer: Self-pay | Source: Ambulatory Visit | Attending: Surgery

## 2015-11-23 ENCOUNTER — Telehealth: Payer: Self-pay | Admitting: Surgery

## 2015-11-23 ENCOUNTER — Observation Stay (HOSPITAL_COMMUNITY)
Admission: RE | Admit: 2015-11-23 | Discharge: 2015-11-24 | Disposition: A | Discharge status: Home / Self Care | Payer: Medicare (Managed Care) | Source: Ambulatory Visit | Attending: Surgery | Admitting: Surgery

## 2015-11-23 ENCOUNTER — Encounter (HOSPITAL_COMMUNITY): Payer: Self-pay | Admitting: *Deleted

## 2015-11-23 DIAGNOSIS — R58 Hemorrhage, not elsewhere classified: Secondary | ICD-10-CM | POA: Diagnosis present

## 2015-11-23 DIAGNOSIS — E785 Hyperlipidemia, unspecified: Secondary | ICD-10-CM | POA: Diagnosis not present

## 2015-11-23 DIAGNOSIS — Z95820 Peripheral vascular angioplasty status with implants and grafts: Secondary | ICD-10-CM | POA: Diagnosis not present

## 2015-11-23 DIAGNOSIS — I129 Hypertensive chronic kidney disease with stage 1 through stage 4 chronic kidney disease, or unspecified chronic kidney disease: Secondary | ICD-10-CM | POA: Diagnosis not present

## 2015-11-23 DIAGNOSIS — E1151 Type 2 diabetes mellitus with diabetic peripheral angiopathy without gangrene: Secondary | ICD-10-CM | POA: Diagnosis not present

## 2015-11-23 DIAGNOSIS — N183 Chronic kidney disease, stage 3 (moderate): Secondary | ICD-10-CM | POA: Insufficient documentation

## 2015-11-23 DIAGNOSIS — Z7902 Long term (current) use of antithrombotics/antiplatelets: Secondary | ICD-10-CM | POA: Diagnosis not present

## 2015-11-23 DIAGNOSIS — L97329 Non-pressure chronic ulcer of left ankle with unspecified severity: Principal | ICD-10-CM | POA: Insufficient documentation

## 2015-11-23 DIAGNOSIS — Z79899 Other long term (current) drug therapy: Secondary | ICD-10-CM | POA: Insufficient documentation

## 2015-11-23 DIAGNOSIS — Z87891 Personal history of nicotine dependence: Secondary | ICD-10-CM | POA: Diagnosis not present

## 2015-11-23 DIAGNOSIS — I69328 Other speech and language deficits following cerebral infarction: Secondary | ICD-10-CM | POA: Diagnosis not present

## 2015-11-23 DIAGNOSIS — E1122 Type 2 diabetes mellitus with diabetic chronic kidney disease: Secondary | ICD-10-CM | POA: Insufficient documentation

## 2015-11-23 DIAGNOSIS — I69351 Hemiplegia and hemiparesis following cerebral infarction affecting right dominant side: Secondary | ICD-10-CM | POA: Diagnosis not present

## 2015-11-23 HISTORY — DX: Type 2 diabetes mellitus without complications: E11.9

## 2015-11-23 HISTORY — PX: PERIPHERAL VASCULAR CATHETERIZATION: SHX172C

## 2015-11-23 LAB — POCT ACTIVATED CLOTTING TIME
Activated Clotting Time: 202 seconds
Activated Clotting Time: 202 seconds
Activated Clotting Time: 180 seconds

## 2015-11-23 LAB — POCT I-STAT, CHEM 8
BUN: 37 mg/dL — ABNORMAL HIGH (ref 6–20)
CALCIUM ION: 1.25 mmol/L (ref 1.15–1.40)
CHLORIDE: 104 mmol/L (ref 101–111)
Creatinine, Ser: 1.8 mg/dL — ABNORMAL HIGH (ref 0.44–1.00)
GLUCOSE: 94 mg/dL (ref 65–99)
HCT: 39 % (ref 36.0–46.0)
Hemoglobin: 13.3 g/dL (ref 12.0–15.0)
Potassium: 3.9 mmol/L (ref 3.5–5.1)
SODIUM: 140 mmol/L (ref 135–145)
TCO2: 28 mmol/L (ref 0–100)

## 2015-11-23 LAB — GLUCOSE, CAPILLARY: GLUCOSE-CAPILLARY: 98 mg/dL (ref 65–99)

## 2015-11-23 SURGERY — ABDOMINAL AORTOGRAM W/LOWER EXTREMITY

## 2015-11-23 MED ORDER — HEPARIN SODIUM (PORCINE) 1000 UNIT/ML IJ SOLN
INTRAMUSCULAR | Status: DC | PRN
  Administered 2015-11-23: 6500 [IU] via INTRAVENOUS

## 2015-11-23 MED ORDER — HYDRALAZINE HCL 20 MG/ML IJ SOLN: 5.0000 mg | INTRAMUSCULAR | Status: DC | PRN

## 2015-11-23 MED ORDER — MORPHINE SULFATE (PF) 2 MG/ML IV SOLN: 2.0000 mg | INTRAVENOUS | Status: DC | PRN

## 2015-11-23 MED ORDER — MIDAZOLAM HCL 2 MG/2ML IJ SOLN
INTRAMUSCULAR | Status: AC
  Filled 2015-11-23: qty 2

## 2015-11-23 MED ORDER — ALUM & MAG HYDROXIDE-SIMETH 200-200-20 MG/5ML PO SUSP: 15.0000 mL | ORAL | Status: DC | PRN

## 2015-11-23 MED ORDER — ONDANSETRON HCL 4 MG/2ML IJ SOLN: 4.0000 mg | Freq: Four times a day (QID) | INTRAMUSCULAR | Status: DC | PRN

## 2015-11-23 MED ORDER — LIDOCAINE HCL (PF) 1 % IJ SOLN
INTRAMUSCULAR | Status: DC | PRN
  Administered 2015-11-23: 20 mL

## 2015-11-23 MED ORDER — ACETAMINOPHEN 325 MG PO TABS: 325.0000 mg | ORAL_TABLET | ORAL | Status: DC | PRN

## 2015-11-23 MED ORDER — METOPROLOL TARTRATE 5 MG/5ML IV SOLN: 2.0000 mg | INTRAVENOUS | Status: DC | PRN

## 2015-11-23 MED ORDER — LABETALOL HCL 5 MG/ML IV SOLN: 10.0000 mg | INTRAVENOUS | Status: DC | PRN

## 2015-11-23 MED ORDER — MIDAZOLAM HCL 2 MG/2ML IJ SOLN
INTRAMUSCULAR | Status: DC | PRN
  Administered 2015-11-23: 1 mg via INTRAVENOUS

## 2015-11-23 MED ORDER — SODIUM CHLORIDE 0.9 % IV SOLN
INTRAVENOUS | Status: DC
  Administered 2015-11-23: 250 mL via INTRAVENOUS
  Administered 2015-11-23: 08:00:00 via INTRAVENOUS

## 2015-11-23 MED ORDER — DOCUSATE SODIUM 100 MG PO CAPS: 100.0000 mg | ORAL_CAPSULE | Freq: Every day | ORAL | Status: DC

## 2015-11-23 MED ORDER — LIDOCAINE HCL (PF) 1 % IJ SOLN
INTRAMUSCULAR | Status: AC
Start: 2015-11-23 — End: ?
  Filled 2015-11-23: qty 30

## 2015-11-23 MED ORDER — HEPARIN (PORCINE) IN NACL 2-0.9 UNIT/ML-% IJ SOLN
INTRAMUSCULAR | Status: AC
  Filled 2015-11-23: qty 500

## 2015-11-23 MED ORDER — OXYCODONE HCL 5 MG PO TABS: 5.0000 mg | ORAL_TABLET | ORAL | Status: DC | PRN

## 2015-11-23 MED ORDER — ACETAMINOPHEN 325 MG RE SUPP: 325.0000 mg | RECTAL | Status: DC | PRN

## 2015-11-23 MED ORDER — GUAIFENESIN-DM 100-10 MG/5ML PO SYRP: 15.0000 mL | ORAL_SOLUTION | ORAL | Status: DC | PRN

## 2015-11-23 MED ORDER — IODIXANOL 320 MG/ML IV SOLN
INTRAVENOUS | Status: DC | PRN
  Administered 2015-11-23: 35 mL via INTRA_ARTERIAL

## 2015-11-23 MED ORDER — FENTANYL CITRATE (PF) 100 MCG/2ML IJ SOLN
INTRAMUSCULAR | Status: DC | PRN
  Administered 2015-11-23: 25 ug via INTRAVENOUS

## 2015-11-23 MED ORDER — NITROGLYCERIN IN D5W 200-5 MCG/ML-% IV SOLN
INTRAVENOUS | Status: AC
  Filled 2015-11-23: qty 250

## 2015-11-23 MED ORDER — PHENOL 1.4 % MT LIQD: 1.0000 | OROMUCOSAL | Status: DC | PRN

## 2015-11-23 MED ORDER — FENTANYL CITRATE (PF) 100 MCG/2ML IJ SOLN
INTRAMUSCULAR | Status: AC
  Filled 2015-11-23: qty 2

## 2015-11-23 MED ORDER — HEPARIN (PORCINE) IN NACL 2-0.9 UNIT/ML-% IJ SOLN
INTRAMUSCULAR | Status: DC | PRN
  Administered 2015-11-23: 1000 mL

## 2015-11-23 MED ORDER — MORPHINE SULFATE (PF) 10 MG/ML IV SOLN: 2.0000 mg | INTRAVENOUS | Status: DC | PRN

## 2015-11-23 MED ORDER — SODIUM CHLORIDE 0.9 % IV SOLN: 1.0000 mL/kg/h | INTRAVENOUS | Status: DC

## 2015-11-23 MED ORDER — HEPARIN SODIUM (PORCINE) 1000 UNIT/ML IJ SOLN
INTRAMUSCULAR | Status: AC
  Filled 2015-11-23: qty 1

## 2015-11-23 MED ORDER — SODIUM CHLORIDE 0.9 % IV SOLN
INTRAVENOUS | Status: DC
  Administered 2015-11-23: 23:00:00 via INTRAVENOUS

## 2015-11-23 MED ORDER — VERAPAMIL HCL 2.5 MG/ML IV SOLN
INTRAVENOUS | Status: AC
  Filled 2015-11-23: qty 2

## 2015-11-23 SURGICAL SUPPLY — 19 items
CATH OMNI FLUSH 5F 65CM (CATHETERS) ×1 IMPLANT
CATH QUICKCROSS .018X135CM (MICROCATHETER) ×1 IMPLANT
CATH SOFT-VU ST 4F 90CM (CATHETERS) ×1 IMPLANT
FILTER CO2 0.2 MICRON (VASCULAR PRODUCTS) ×1 IMPLANT
HEMOSTASIS PAD V PLUS (HEMOSTASIS) ×1 IMPLANT
KIT MICROINTRODUCER STIFF 5F (SHEATH) ×1 IMPLANT
KIT PV (KITS) ×3 IMPLANT
LUBRICANT VIPERSLIDE CORONARY (MISCELLANEOUS) ×1 IMPLANT
RESERVOIR CO2 (VASCULAR PRODUCTS) ×1 IMPLANT
SET FLUSH CO2 (MISCELLANEOUS) ×1 IMPLANT
SHEATH HIGHFLEX ANSEL 6FRX55 (SHEATH) ×1 IMPLANT
SHEATH PINNACLE 5F 10CM (SHEATH) ×1 IMPLANT
SHIELD RADPAD SCOOP 12X17 (MISCELLANEOUS) ×1 IMPLANT
SYR MEDRAD MARK V 150ML (SYRINGE) ×3 IMPLANT
TRANSDUCER W/STOPCOCK (MISCELLANEOUS) ×3 IMPLANT
TRAY PV CATH (CUSTOM PROCEDURE TRAY) ×3 IMPLANT
WIRE BENTSON .035X145CM (WIRE) ×1 IMPLANT
WIRE G V18X300CM (WIRE) ×2 IMPLANT
WIRE HI TORQ VERSACORE J 260CM (WIRE) ×1 IMPLANT

## 2015-11-23 NOTE — Interval H&P Note (Signed)
History and Physical Interval Note:  11/23/2015 10:16 AM  Emma NakayamaSarah M Zappone  has presented today for surgery, with the diagnosis of pvd  The various methods of treatment have been discussed with the patient and family. After consideration of risks, benefits and other options for treatment, the patient has consented to  Procedure(s): Abdominal Aortogram w/Lower Extremity (N/A) as a surgical intervention .  The patient's history has been reviewed, patient examined, no change in status, stable for surgery.  I have reviewed the patient's chart and labs.  Questions were answered to the patient's satisfaction.     Durene CalBrabham, Wells

## 2015-11-23 NOTE — Progress Notes (Signed)
Site area: RT GROIN Site Prior to Removal:  Level 0 Pressure Applied For:20 minutes Manual:  yes  Patient Status During Pull: asleep  Post Pull Site:  Level 0 Post Pull Instructions Given:  yes Post Pull Pulses Present: rt dp and pt doppler Dressing Applied:  yes Bedrest begins @ 14:05:00 Comments:

## 2015-11-23 NOTE — Progress Notes (Signed)
Report called and transferred to 2-W-12

## 2015-11-23 NOTE — Progress Notes (Signed)
On arrival from cath lab bleeding noted right groin and Jan holding pressure right groin

## 2015-11-23 NOTE — Progress Notes (Signed)
Patient admitted to 2W12. A&O x4, VSS. Patient placed on telemetry and CCMD notified. Patient does not have any questions or concerns at this time. Oriented to room and call light placed within reach.

## 2015-11-23 NOTE — Op Note (Signed)
Patient name: Emma NakayamaSarah M Tyler MRN: [id number] DOB: Aug 31, 1935 Sex: female  11/23/2015 Pre-operative Diagnosis: Bilateral lower extremity ulcers Post-operative diagnosis:  Same Surgeon:  Durene CalBrabham, Wells Procedure Performed:  1.  Ultrasound-guided access, right femoral artery  2.  Abdominal aortogram with CO2   3.  bilateral lower extremity runoff  4.  Failed angioplasty, left peroneal artery  5.  Catheter selection into the peroneal artery (additional order)  6.  Catheter selection into the anterior tibial artery oh (additional order)  7.  Failed angioplasty, left anterior tibial artery  8.  Conscious sedation (57 minutes)   Indications:  The patient has a history of present intervention to the left leg for ulcer.  She was able to heal her ulcer, however has developed a new lesion.  She comes in today for further evaluation and possible treatment.  Procedure:  The patient was identified in the holding area and taken to room 8.  The patient was then placed supine on the table and prepped and draped in the usual sterile fashion.  A time out was called.  Conscious sedation was performed with the use of IV fentanyl and Versed in a continuous physician and nurse monitoring.  Heart rate, blood pressure, and oxygen saturation were continuously monitored.  Ultrasound was used to evaluate the right common femoral artery.  It was patent .  A digital ultrasound image was acquired.  A micropuncture needle was used to access the right common femoral artery under ultrasound guidance.  An 018 wire was advanced without resistance and a micropuncture sheath was placed.  The 018 wire was removed and a benson wire was placed.  The micropuncture sheath was exchanged for a 5 french sheath.  An omniflush catheter was advanced over the wire to the level of L-1.  An abdominal angiogram was obtained.  Next, using the omniflush catheter and a benson wire, the aortic bifurcation was crossed and the catheter was placed  into theleft external iliac artery and left runoff was obtained.  right runoff was performed via retrograde sheath injections.  Findings:   Aortogram:  No significant renal artery stenosis.  The infrarenal abdominal aorta is patent throughout it's course.  Bilateral iliac arteries are widely patent  Right Lower Extremity:  The right common femoral, profundofemoral, and superficial femoral artery are patent throughout their course.  Popliteal arteries patent with three-vessel runoff.  Left Lower Extremity:  Left common femoral profunda femoral and superficial femoral artery are widely patent without hemodynamically significant stenosis.  A stent is visualized in the distal superficial femoral artery which is widely patent.  The popliteal artery is patent throughout it's course.  There is single vessel runoff through the peroneal artery.  There does appear to be a focal short segment occlusion with numerous collaterals at its origin.  There is delayed reconstitution of the anterior tibial artery/dorsalis pedis artery at the ankle.  Intervention:  After the above images were acquired the decision was made to proceed with intervention.  A 6 French sheath was advanced into the mid superficial femoral artery.  Through a quick cross catheter situated in the popliteal artery I got additional contrast images.  It was difficult to tell if there was in-line flow through the peroneal artery or whether or not there was a short segment occlusion with collateral filling of the proximal peroneal artery.  I elected to try to cross this lesion and treat this.  I used a 318 wire and a quick cross catheter.  Multiple passes an  attempt to cross the lesion were performed but all were unsuccessful.  Next, I elected to attempt recanalization of the anterior tibial artery.  This was selected with the VAT wire and quick cross catheter.  Subintimal recanalization was performed, however I was not able to gain reentry.  Ultimately I  elected to terminate the procedure.  Catheters and wires were removed.  The patient taken the holding area for sheath pull once coag was profile corrects.  Impression:  #1  likely short segment focal occlusion at the origin of the peroneal artery with multiple collaterals filling the proximal peroneal artery which is a single vessel runoff.  #2  no further attempts at percutaneous revascularization of the left leg are recommended.  #3  a total of 30 cc of contrast was utilized  #4  single vessel runoff via the peroneal artery on the right with no significant stenosis identified   V. Durene CalWells [name], M.D. [facility] Office: [phone] Pager:  [phone]

## 2015-11-23 NOTE — Telephone Encounter (Signed)
-----   Message from Sharee PimpleMarilyn K McChesney, RN sent at 11/23/2015 11:58 AM EDT ----- Regarding: schedule   ----- Message ----- From: [name], MD Sent: 11/23/2015  11:57 AM To: Vvs Charge Pool  11/23/2015:  Surgeon:  Durene CalBrabham, Wells Procedure Performed:  1.  Ultrasound-guided access, right femoral artery  2.  Abdominal aortogram with CO2   3.  bilateral lower extremity runoff  4.  Failed angioplasty, left peroneal artery  5.  Catheter selection into the peroneal artery (additional order)  6.  Catheter selection into the anterior tibial artery oh (additional order)  7.  Failed angioplasty, left anterior tibial artery  8.  Conscious sedation (57 minutes)   Have patient follow up with me in 46 weeks for wound check

## 2015-11-23 NOTE — Telephone Encounter (Signed)
Pts daughter is aware of appt on [facility] is as well for 01/03/16

## 2015-11-23 NOTE — H&P (View-Only) (Signed)
[facility]  Patient name: Emma NakayamaSarah M Tyler MRN: [id number] DOB: [birth year] Sex: female  REASON FOR VISIT: wound left leg  HPI: The patient is here today for follow-up.  She initially presented with a left lateral malleolar ulcer.  Arterial evaluation revealed diminished blood flow.  On 07/21/2014 she underwent angiography.  A 75% stenosis was identified within the left superficial femoral artery.  She underwent atherectomy and drug coated balloon angioplasty, followed by stenting.  Her procedure was uncomplicated  She has a new painful left lateral malleolus area with a scab over top.  It has been present for approximately a year.  There is a similar area on the right but not as big.  She had multiple strokes, documented in 2009 and 2012, pt's speech is difficult to understand, and she indicates that she has mild left side weakness.   She has a walker with her, does not seem to walk much to elicit claudication sx's.  Past Medical History:  Diagnosis Date  . Arthritis    hands  . Asthma   . Chronic kidney disease (CKD), stage III (moderate)   . Depression   . Diverticulosis   . Hx of adenomatous colonic polyps   . Hyperlipidemia   . Hypertension   . Hypochromic anemia   . Internal hemorrhoids   . Stroke [facility](HCC) May 2009   multiple, last one Nov 2012, 2 strokes with right sided deficits and slurred speech    Family History  Problem Relation Age of Onset  . Diabetes Paternal Aunt   . Stroke Father   . Heart disease Father   . Diabetes Father   . Depression Father   . HIV Brother   . Prostate cancer    . Cancer Sister     unknown type  . Colon cancer Neg Hx     SOCIAL HISTORY: Social History  Substance Use Topics  . Smoking status: Former Games developermoker  . Smokeless tobacco: Never Used  . Alcohol use No    Allergies  Allergen Reactions  . Aspirin Nausea And Vomiting    Hurts stomach    Current  Outpatient Prescriptions  Medication Sig Dispense Refill  . acetaminophen (TYLENOL) 325 MG tablet Take 650 mg by mouth every 6 (six) hours as needed.    Marland Kitchen. atorvastatin (LIPITOR) 40 MG tablet Take 40 mg by mouth daily.    . Cholecalciferol (VITAMIN D3) 1000 UNITS CAPS Take by mouth.    . clopidogrel (PLAVIX) 75 MG tablet Take 75 mg by mouth daily.    . hydrocortisone (ANUSOL-HC) 25 MG suppository Place 25 mg rectally 2 (two) times daily as needed for hemorrhoids.     Marland Kitchen. loratadine (CLARITIN) 10 MG tablet Take 10 mg by mouth daily as needed for allergies.     Marland Kitchen. losartan-hydrochlorothiazide (HYZAAR) 50-12.5 MG tablet Take 1 tablet by mouth daily.    . magnesium hydroxide (MILK OF MAGNESIA) 400 MG/5ML suspension Take 15 mLs by mouth daily as needed for mild constipation. 360 mL 0  . zolpidem (AMBIEN) 5 MG tablet Take 5 mg by mouth at bedtime as needed for sleep.    Marland Kitchen. donepezil (ARICEPT) 10 MG tablet Take 10 mg by mouth at bedtime.    . sennosides-docusate sodium (SENOKOT-S) 8.6-50 MG tablet Take 1 tablet by mouth daily as needed for constipation.      No current facility-administered medications for this visit.     REVIEW OF SYSTEMS:  [X]  denotes positive finding, [ ]   denotes negative finding Cardiac  Comments:  Chest pain or chest pressure:    Shortness of breath upon exertion:    Short of breath when lying flat:    Irregular heart rhythm:        Vascular    Pain in calf, thigh, or hip brought on by ambulation: x   Pain in feet at night that wakes you up from your sleep:     Blood clot in your veins:    Leg swelling:         Pulmonary    Oxygen at home:    Productive cough:     Wheezing:         Neurologic    Sudden weakness in arms or legs:     Sudden numbness in arms or legs:     Sudden onset of difficulty speaking or slurred speech: x   Temporary loss of vision in one eye:     Problems with dizziness:         Gastrointestinal    Blood in stool:     Vomited blood:           Genitourinary    Burning when urinating:     Blood in urine:        Psychiatric    Major depression:         Hematologic    Bleeding problems:    Problems with blood clotting too easily:        Skin    Rashes or ulcers: x       Constitutional    Fever or chills:      PHYSICAL EXAM: Vitals:   11/15/15 1050  BP: 126/68  Pulse: 66  Resp: 14  Temp: 97.2 F (36.2 C)  TempSrc: Oral  SpO2: 95%  Weight: 134 lb (60.8 kg)  Height: 5' 5" (1.651 m)    GENERAL: The patient is a well-nourished female, in no acute distress. The vital signs are documented above. CARDIAC: There is a regular rate and rhythm.  VASCULAR: Nonpalpable pedal pulses PULMONARY: There is good air exchange bilaterally without wheezing or rales. MUSCULOSKELETAL: There are no major deformities or cyanosis. NEUROLOGIC: No focal weakness or paresthesias are detected. SKIN: Dry eschar on the lateral malleolus of the left leg.  Approximate 1 cm in diameter no erythema. PSYCHIATRIC: The patient has a normal affect.  DATA:  Recent duplex suggests patent superficial femoral and popliteal artery intervention with triphasic waveforms.  At the ankle, the patient has monophasic waveforms  MEDICAL ISSUES: Left lateral malleolus ulcer: The ultrasound the patient had suggested disease distal to the previously treated superficial femoral and popliteal artery.  An attempt for limb salvage of think we need to proceed with aortogram with runoff of both legs, with access in the right groin.  Intervention on the left leg will be performed if indicated.  This is been scheduled for Tuesday, October 31.    Wells Brabham, MD [facility] of [place] Tel [phone] Pager [phone] 

## 2015-11-23 NOTE — Progress Notes (Signed)
Pressure held by Jan for 10min and pressure dressing applied

## 2015-11-23 NOTE — Progress Notes (Signed)
Cont oozing noted right groin and pressure held x 10min and Dr Myra GianottiBrabham  Notified and V-Pad placed right groin

## 2015-11-23 NOTE — Progress Notes (Addendum)
Patient with oozing from right groin cannulation site after multiple compression attempts.  Will place V-Pad and observe overnight.  Should be able to go home tomorrow if no more bleeding.  Will check Cr and HB in am.  Gentle hydration over night.  Durene CalWells Brabham

## 2015-11-24 DIAGNOSIS — L97329 Non-pressure chronic ulcer of left ankle with unspecified severity: Secondary | ICD-10-CM | POA: Diagnosis not present

## 2015-11-24 LAB — BASIC METABOLIC PANEL
Anion gap: 9 (ref 5–15)
BUN: 33 mg/dL — AB (ref 6–20)
CHLORIDE: 110 mmol/L (ref 101–111)
CO2: 23 mmol/L (ref 22–32)
CREATININE: 1.88 mg/dL — AB (ref 0.44–1.00)
Calcium: 8.7 mg/dL — ABNORMAL LOW (ref 8.9–10.3)
GFR calc Af Amer: 28 mL/min — ABNORMAL LOW (ref >60)
GFR calc non Af Amer: 24 mL/min — ABNORMAL LOW (ref >60)
Glucose, Bld: 85 mg/dL (ref 65–99)
Potassium: 4 mmol/L (ref 3.5–5.1)
SODIUM: 142 mmol/L (ref 135–145)

## 2015-11-24 LAB — CBC
HEMATOCRIT: 32.2 % — AB (ref 36.0–46.0)
HEMOGLOBIN: 10.3 g/dL — AB (ref 12.0–15.0)
MCH: 27.4 pg (ref 26.0–34.0)
MCHC: 32 g/dL (ref 30.0–36.0)
MCV: 85.6 fL (ref 78.0–100.0)
Platelets: 166 10*3/uL (ref 150–400)
RBC: 3.76 MIL/uL — ABNORMAL LOW (ref 3.87–5.11)
RDW: 14.5 % (ref 11.5–15.5)
WBC: 6.4 10*3/uL (ref 4.0–10.5)

## 2015-11-24 MED FILL — Verapamil HCl IV Soln 2.5 MG/ML: INTRAVENOUS | Qty: 2 | Status: AC

## 2015-11-24 MED FILL — Nitroglycerin IV Soln 200 MCG/ML in D5W: INTRAVENOUS | Qty: 250 | Status: AC

## 2015-11-24 NOTE — Progress Notes (Signed)
  [facility] Progress Note  Subjective  - POD #1  Feeling well this morning. No complaints.   Objective Vitals:   11/23/15 1954 11/24/15 0435  BP: 92/62 91/64  Pulse: 75 77  Resp: 18 18  Temp: 98.2 F (36.8 C) 97.6 F (36.4 C)    Intake/Output Summary (Last 24 hours) at 11/24/15 0803 Last data filed at 11/24/15 0400  Gross per 24 hour  Intake            160.5 ml  Output                0 ml  Net            160.5 ml    Right groin without hematoma. Feet warm bilaterally.   Assessment/Planning: 80 y.o. female is s/p:  1.  Ultrasound-guided access, right femoral artery 2.  Abdominal aortogram with CO2  3.  bilateral lower extremity runoff 4.  Failed angioplasty, left peroneal artery 5.  Catheter selection into the peroneal artery (additional order) 6.  Catheter selection into the anterior tibial artery oh (additional order) 7.  Failed angioplasty, left anterior tibial artery 8.  Conscious sedation (57 minutes) 1 Day Post-Op   Had some bleeding after procedure yesterday. No further bleeding this am right groin. No hematoma present.  Creatinine and Hgb stable. D/c home today. F/u in 4-6 weeks with Dr. Leanor KailBrabham.  [name] A [name] 11/24/2015 8:03 AM --  Laboratory CBC    Component Value Date/Time   WBC 6.4 11/24/2015 0414   HGB 10.3 (L) 11/24/2015 0414   HCT 32.2 (L) 11/24/2015 0414   PLT 166 11/24/2015 0414    BMET    Component Value Date/Time   NA 142 11/24/2015 0414   K 4.0 11/24/2015 0414   CL 110 11/24/2015 0414   CO2 23 11/24/2015 0414   GLUCOSE 85 11/24/2015 0414   BUN 33 (H) 11/24/2015 0414   CREATININE 1.88 (H) 11/24/2015 0414   CALCIUM 8.7 (L) 11/24/2015 0414   GFRNONAA 24 (L) 11/24/2015 0414   GFRAA 28 (L) 11/24/2015 0414    COAG Lab Results  Component Value Date   INR 1.00 05/26/2012   INR 1.08 11/28/2010   INR 1.23 07/25/2010   No results found for: PTT  Antibiotics Anti-infectives    None       Emma BergerKimberly  [name], PA-C [facility] Office: [phone] Pager: [phone] 11/24/2015 8:03 AM

## 2015-11-24 NOTE — Progress Notes (Signed)
[name] to be D/C'd Home per MD order. Discussed with the patient and all questions fully answered.    VVS, Skin clean, dry and intact without evidence of skin break down, no evidence of skin tears noted.  IV catheter discontinued intact. Site without signs and symptoms of complications. Dressing and pressure applied.  An After Visit Summary was printed and given to the patient.  Patient escorted via WC, and D/C home via private auto.  [name], [name]  11/24/2015 11:14 AM

## 2015-11-25 NOTE — Discharge Summary (Signed)
[facility] Discharge Summary  Emma NakayamaSarah M Tyler [birth year] 80 y.o. female  [id number]  Admission Date: 11/23/2015  Discharge Date: 11/24/2015  Physician: Durene CalWells Brabham, MD Admission Diagnosis: pvd  HPI:   This is a 80 y.o. female who initially presented with a left lateral malleolar ulcer. Arterial evaluation revealed diminished blood flow. On 07/21/2014 she underwent angiography. A 75% stenosis was identified within the left superficial femoral artery. She underwent atherectomy and drug coated balloon angioplasty, followed by stenting. Her procedure was uncomplicated  She has a new painful left lateral malleolus area with a scab over top.  It has been present for approximately a year.  There is a similar area on the right but not as big.  She had multiple strokes, documented in 2009 and 2012, pt's speech is difficult to understand, and she indicates that she has mild left side weakness.   She has a walkerwith her, does not seem to walk much to elicit claudication sx's.   Hospital Course:  The patient was admitted to the hospital and taken to the operating room on 11/23/2015 and underwent:  1.  Ultrasound-guided access, right femoral artery  2.  Abdominal aortogram with CO2              3.  bilateral lower extremity runoff             4.  Failed angioplasty, left peroneal artery             5.  Catheter selection into the peroneal artery (additional order)             6.  Catheter selection into the anterior tibial artery oh (additional order)             7.  Failed angioplasty, left anterior tibial artery             8.  Conscious sedation (57 minutes)  Findings:             Aortogram:  No significant renal artery stenosis.  The infrarenal abdominal aorta is patent throughout it's course.  Bilateral iliac arteries are widely patent             Right Lower Extremity:  The right common femoral, profundofemoral, and superficial femoral artery are  patent throughout their course.  Popliteal arteries patent with three-vessel runoff.             Left Lower Extremity:  Left common femoral profunda femoral and superficial femoral artery are widely patent without hemodynamically significant stenosis.  A stent is visualized in the distal superficial femoral artery which is widely patent.  The popliteal artery is patent throughout it's course.  There is single vessel runoff through the peroneal artery.  There does appear to be a focal short segment occlusion with numerous collaterals at its origin.  There is delayed reconstitution of the anterior tibial artery/dorsalis pedis artery at the ankle.  Impression:             #1  likely short segment focal occlusion at the origin of the peroneal artery with multiple collaterals filling the proximal peroneal artery which is a single vessel runoff.             #2  no further attempts at percutaneous revascularization of the left leg are recommended.             #3  a total of 30 cc of contrast was utilized             #  4  single vessel runoff via the peroneal artery on the right with no significant stenosis identified  The patient tolerated the procedure well and was transported to the recovery area in stable condition.   She had oozing from her right groin cannulation site after multiple compression attempts. A V-pad was placed and she was observed overnight. On POD 1, she had no further bleeding without evidence of hematoma. Her hemoglobin and creatinine were stable. She was discharged home in good condition.    CBC    Component Value Date/Time   WBC 6.4 11/24/2015 0414   RBC 3.76 (L) 11/24/2015 0414   HGB 10.3 (L) 11/24/2015 0414   HCT 32.2 (L) 11/24/2015 0414   PLT 166 11/24/2015 0414   MCV 85.6 11/24/2015 0414   MCH 27.4 11/24/2015 0414   MCHC 32.0 11/24/2015 0414   RDW 14.5 11/24/2015 0414   LYMPHSABS 1.3 12/28/2014 1504   MONOABS 0.3 12/28/2014 1504   EOSABS 0.1 12/28/2014 1504   BASOSABS  0.0 12/28/2014 1504    BMET    Component Value Date/Time   NA 142 11/24/2015 0414   K 4.0 11/24/2015 0414   CL 110 11/24/2015 0414   CO2 23 11/24/2015 0414   GLUCOSE 85 11/24/2015 0414   BUN 33 (H) 11/24/2015 0414   CREATININE 1.88 (H) 11/24/2015 0414   CALCIUM 8.7 (L) 11/24/2015 0414   GFRNONAA 24 (L) 11/24/2015 0414   GFRAA 28 (L) 11/24/2015 0414     Discharge Instructions:   The patient is discharged to home with extensive instructions on wound care and progressive ambulation.  They are instructed not to drive or perform any heavy lifting until returning to see the physician in his office.  Discharge Instructions    Call MD for:  redness, tenderness, or signs of infection (pain, swelling, bleeding, redness, odor or green/yellow discharge around incision site)    Complete by:  As directed    Call MD for:  severe or increased pain, loss or decreased feeling  in affected limb(s)    Complete by:  As directed    Call MD for:  temperature >100.5    Complete by:  As directed    Discharge wound care:    Complete by:  As directed    Take off dressing tomorrow and shower daily with soap and water.   Driving Restrictions    Complete by:  As directed    No driving for 1 week   Increase activity slowly    Complete by:  As directed    Walk with assistance use walker or cane as needed   Lifting restrictions    Complete by:  As directed    No lifting for 1 week   Resume previous diet    Complete by:  As directed       Discharge Diagnosis:  pvd  Secondary Diagnosis: Patient Active Problem List   Diagnosis Date Noted  . Bleeding 11/23/2015  . Chronic kidney disease 05/29/2012  . Skin ulcer, stage 2 (HCC) 05/29/2012  . Internal hemorrhoid, bleeding 05/29/2012  . Severe protein-calorie malnutrition (HCC) 03/24/2012  . Hyponatremia 03/23/2012  . Hypokalemia 03/23/2012  . Urinary tract infection 03/23/2012  . Physical deconditioning 03/23/2012  . CKD (chronic kidney disease)  stage 3, GFR 30-59 ml/min 12/01/2010  . Encephalopathy 12/01/2010  . TIA (transient ischemic attack) 11/28/2010  . ASTHMA 08/25/2008  . COLONIC POLYPS, ADENOMATOUS, HX OF 08/25/2008  . HYPERLIPIDEMIA 08/17/2008  . Microcytic hypochromic anemia 08/17/2008  . HYPERTENSION  08/17/2008  . CVA history  08/17/2008  . DIVERTICULITIS, COLON 08/17/2008  . HYPERGLYCEMIA 08/17/2008   Past Medical History:  Diagnosis Date  . Arthritis    hands  . Asthma   . Chronic kidney disease (CKD), stage III (moderate)   . Depression   . Diabetes mellitus without complication (HCC)   . Diverticulosis   . Hx of adenomatous colonic polyps   . Hyperlipidemia   . Hypertension   . Hypochromic anemia   . Internal hemorrhoids   . Stroke [facility]) May 2009   multiple, last one Nov 2012, 2 strokes with right sided deficits and slurred speech       Medication List    TAKE these medications   acetaminophen 325 MG tablet Commonly known as:  TYLENOL Take 650 mg by mouth every 6 (six) hours as needed (for pain/fever).   atorvastatin 40 MG tablet Commonly known as:  LIPITOR Take 40 mg by mouth daily.   clopidogrel 75 MG tablet Commonly known as:  PLAVIX Take 75 mg by mouth daily.   hydrocortisone 25 MG suppository Commonly known as:  ANUSOL-HC Place 25 mg rectally 2 (two) times daily as needed for hemorrhoids.   loratadine 10 MG tablet Commonly known as:  CLARITIN Take 10 mg by mouth daily as needed for allergies.   losartan-hydrochlorothiazide 50-12.5 MG tablet Commonly known as:  HYZAAR Take 1 tablet by mouth daily.   sennosides-docusate sodium 8.6-50 MG tablet Commonly known as:  SENOKOT-S Take 1 tablet by mouth daily as needed for constipation.   Vitamin D3 1000 units Caps Take 1,000 Units by mouth daily.       Disposition: Home  Patient's condition: is Good  Follow up: 1. Dr. Myra Gianotti in 4-6 weeks   [name], PA-C [facility] [phone] 11/25/2015    2:52 PM

## 2015-12-27 ENCOUNTER — Ambulatory Visit: Payer: Medicare (Managed Care) | Admitting: Surgery

## 2015-12-29 ENCOUNTER — Encounter: Payer: Self-pay | Admitting: Surgery

## 2016-01-03 ENCOUNTER — Encounter: Payer: Self-pay | Admitting: Surgery

## 2016-01-03 ENCOUNTER — Other Ambulatory Visit: Payer: Self-pay

## 2016-01-03 ENCOUNTER — Ambulatory Visit (INDEPENDENT_AMBULATORY_CARE_PROVIDER_SITE_OTHER): Payer: Medicare (Managed Care) | Admitting: Surgery

## 2016-01-03 VITALS — BP 132/66 | HR 68 | Temp 97.4°F | Resp 18 | Ht 65.0 in | Wt 134.0 lb

## 2016-01-03 DIAGNOSIS — I739 Peripheral vascular disease, unspecified: Secondary | ICD-10-CM

## 2016-01-03 DIAGNOSIS — L97909 Non-pressure chronic ulcer of unspecified part of unspecified lower leg with unspecified severity: Secondary | ICD-10-CM | POA: Diagnosis not present

## 2016-01-03 DIAGNOSIS — I70299 Other atherosclerosis of native arteries of extremities, unspecified extremity: Secondary | ICD-10-CM

## 2016-01-03 NOTE — Progress Notes (Signed)
[facility]  Patient name: [name] MRN: [id number] DOB: January 04, 1936 Sex: female  REASON FOR VISIT: follow up  HPI: The patient is here today for follow-up. She initially presented with a left lateral malleolar ulcer. Arterial evaluation revealed diminished blood flow. On 07/21/2014 she underwent angiography. A 75% stenosis was identified within the left superficial femoral artery. She underwent atherectomy and drug coated balloon angioplasty, followed by stenting.  She developed a new painful left lateral malleolus ulcer.  This is been present for approximately one year.  She also has a similar area on the right but not as big.  On 11/23/2015 she underwent repeat arteriogram where she was found to have a short focal occlusion of the peroneal artery with multiple collaterals filling the proximal peroneal artery which was the single vessel runoff.  Recanalization was unsuccessful.  No further additional attempts at revascularization were recommended.  She has single-vessel runoff on the right via a peroneal artery with no significant stenosis identified.   She had multiple strokes, documented in 2009 and 2012, pt's speech is difficult to understand, and she indicates that she has mild left side weakness.   She has a walkerwith her, does not seem to walk much to elicit claudication sx's.  Past Medical History:  Diagnosis Date  . Arthritis    hands  . Asthma   . Chronic kidney disease (CKD), stage III (moderate)   . Depression   . Diabetes mellitus without complication (HCC)   . Diverticulosis   . Hx of adenomatous colonic polyps   . Hyperlipidemia   . Hypertension   . Hypochromic anemia   . Internal hemorrhoids   . Stroke [facility](HCC) May 2009   multiple, last one Nov 2012, 2 strokes with right sided deficits and slurred speech    Family History  Problem Relation Age of Onset  . Diabetes Paternal Aunt   .  Stroke Father   . Heart disease Father   . Diabetes Father   . Depression Father   . HIV Brother   . Prostate cancer    . Cancer Sister     unknown type  . Colon cancer Neg Hx     SOCIAL HISTORY: Social History  Substance Use Topics  . Smoking status: Former Games developermoker  . Smokeless tobacco: Never Used  . Alcohol use No    Allergies  Allergen Reactions  . Aspirin Nausea And Vomiting    Hurts stomach    Current Outpatient Prescriptions  Medication Sig Dispense Refill  . acetaminophen (TYLENOL) 325 MG tablet Take 650 mg by mouth every 6 (six) hours as needed (for pain/fever).     Marland Kitchen. atorvastatin (LIPITOR) 40 MG tablet Take 40 mg by mouth daily.    . Cholecalciferol (VITAMIN D3) 1000 UNITS CAPS Take 1,000 Units by mouth daily.     . clopidogrel (PLAVIX) 75 MG tablet Take 75 mg by mouth daily.    . hydrocortisone (ANUSOL-HC) 25 MG suppository Place 25 mg rectally 2 (two) times daily as needed for hemorrhoids.     Marland Kitchen. loratadine (CLARITIN) 10 MG tablet Take 10 mg by mouth daily as needed for allergies.     Marland Kitchen. losartan-hydrochlorothiazide (HYZAAR) 50-12.5 MG tablet Take 1 tablet by mouth daily.    . sennosides-docusate sodium (SENOKOT-S) 8.6-50 MG tablet Take 1 tablet by mouth daily as needed for constipation.      No current facility-administered medications for this visit.     REVIEW OF SYSTEMS:  [X]   denotes positive finding, [ ]  denotes negative finding Cardiac  Comments:  Chest pain or chest pressure:    Shortness of breath upon exertion:    Short of breath when lying flat:    Irregular heart rhythm:        Vascular    Pain in calf, thigh, or hip brought on by ambulation:    Pain in feet at night that wakes you up from your sleep:     Blood clot in your veins:    Leg swelling:         Pulmonary    Oxygen at home:    Productive cough:     Wheezing:         Neurologic    Sudden weakness in arms or legs:     Sudden numbness in arms or legs:     Sudden onset of  difficulty speaking or slurred speech:    Temporary loss of vision in one eye:     Problems with dizziness:         Gastrointestinal    Blood in stool:     Vomited blood:         Genitourinary    Burning when urinating:     Blood in urine:        Psychiatric    Major depression:         Hematologic    Bleeding problems:    Problems with blood clotting too easily:        Skin    Rashes or ulcers:        Constitutional    Fever or chills:      PHYSICAL EXAM: Vitals:   01/03/16 0950  BP: 132/66  Pulse: 68  Resp: 18  Temp: 97.4 F (36.3 C)  TempSrc: Oral  SpO2: 96%  Weight: 134 lb (60.8 kg)  Height: 5\' 5"  (1.651 m)    GENERAL: The patient is a well-nourished female, in no acute distress. The vital signs are documented above. CARDIAC: There is a regular rate and rhythm.  VASCULAR: Nonpalpable pedal pulses PULMONARY: Non-labored respirations MUSCULOSKELETAL: There are no major deformities or cyanosis. NEUROLOGIC: No focal weakness or paresthesias are detected.  Slurred speech SKIN: Eschar over top of the ulcer on the left lateral malleolus which is dramatically improved PSYCHIATRIC: The patient has a normal affect.  DATA:  None  MEDICAL ISSUES: Left leg ulcer: This has nearly healed. .  We will need to continue with surveillance of the superficial femoral/popliteal intervention.    Toe pain: I suspect this is diabetic neuropathy.  I'm starting her on Neurontin 300 mg to be taken nightly.  She will follow-up with me in 6 months with duplex of her legs with ABIs.    Durene CalWells Pauline Trainer, MD [facility] Tel [phone] Pager [phone]

## 2016-01-20 ENCOUNTER — Emergency Department (HOSPITAL_COMMUNITY): Payer: Medicare (Managed Care)

## 2016-01-20 ENCOUNTER — Encounter (HOSPITAL_COMMUNITY): Payer: Self-pay | Admitting: *Deleted

## 2016-01-20 DIAGNOSIS — Z5321 Procedure and treatment not carried out due to patient leaving prior to being seen by health care provider: Secondary | ICD-10-CM | POA: Diagnosis not present

## 2016-01-20 DIAGNOSIS — R109 Unspecified abdominal pain: Secondary | ICD-10-CM | POA: Insufficient documentation

## 2016-01-20 LAB — COMPREHENSIVE METABOLIC PANEL
ALK PHOS: 99 U/L (ref 38–126)
ALT: 159 U/L — AB (ref 14–54)
AST: 394 U/L — AB (ref 15–41)
Albumin: 3.4 g/dL — ABNORMAL LOW (ref 3.5–5.0)
Anion gap: 11 (ref 5–15)
BUN: 37 mg/dL — AB (ref 6–20)
CALCIUM: 9.4 mg/dL (ref 8.9–10.3)
CO2: 20 mmol/L — AB (ref 22–32)
CREATININE: 1.93 mg/dL — AB (ref 0.44–1.00)
Chloride: 108 mmol/L (ref 101–111)
GFR, EST AFRICAN AMERICAN: 28 mL/min — AB (ref >60)
GFR, EST NON AFRICAN AMERICAN: 24 mL/min — AB (ref >60)
Glucose, Bld: 128 mg/dL — ABNORMAL HIGH (ref 65–99)
Potassium: 4.1 mmol/L (ref 3.5–5.1)
Sodium: 139 mmol/L (ref 135–145)
Total Bilirubin: 0.9 mg/dL (ref 0.3–1.2)
Total Protein: 6.5 g/dL (ref 6.5–8.1)

## 2016-01-20 LAB — CBC WITH DIFFERENTIAL/PLATELET
BASOS ABS: 0 10*3/uL (ref 0.0–0.1)
Basophils Relative: 0 %
Eosinophils Absolute: 0 10*3/uL (ref 0.0–0.7)
Eosinophils Relative: 1 %
HCT: 33.5 % — ABNORMAL LOW (ref 36.0–46.0)
HEMOGLOBIN: 10.8 g/dL — AB (ref 12.0–15.0)
LYMPHS ABS: 0.9 10*3/uL (ref 0.7–4.0)
LYMPHS PCT: 13 %
MCH: 27.7 pg (ref 26.0–34.0)
MCHC: 32.2 g/dL (ref 30.0–36.0)
MCV: 85.9 fL (ref 78.0–100.0)
Monocytes Absolute: 0.4 10*3/uL (ref 0.1–1.0)
Monocytes Relative: 7 %
NEUTROS ABS: 5.1 10*3/uL (ref 1.7–7.7)
NEUTROS PCT: 79 %
Platelets: 167 10*3/uL (ref 150–400)
RBC: 3.9 MIL/uL (ref 3.87–5.11)
RDW: 14.3 % (ref 11.5–15.5)
WBC: 6.4 10*3/uL (ref 4.0–10.5)

## 2016-01-20 LAB — I-STAT TROPONIN, ED: TROPONIN I, POC: 0.01 ng/mL (ref 0.00–0.08)

## 2016-01-20 NOTE — ED Notes (Signed)
May need to obtain urine by in and out cath due to patients inability to assist in obtaining urine

## 2016-01-20 NOTE — ED Triage Notes (Signed)
Patient presents via EMS.  States ate dinner about 6pm and then started with a sharp pain in the center to the RUQ area of the abd and the pain traveled to the epigastric area.  When EMS arrived c/o nausea.  Zofran 4mg  IV given and transported.  Deficients from previous stroke right hand weakness, shuffles when walks and dysphagia  No c/o nausea in triag but continues to c/o pain to the center of her abd

## 2016-01-21 ENCOUNTER — Emergency Department (HOSPITAL_COMMUNITY)
Admission: EM | Admit: 2016-01-21 | Discharge: 2016-01-21 | Disposition: A | Discharge status: Home / Self Care | Payer: Medicare (Managed Care) | Attending: Emergency Medicine | Admitting: Emergency Medicine

## 2016-01-21 HISTORY — DX: Unspecified dementia, unspecified severity, without behavioral disturbance, psychotic disturbance, mood disturbance, and anxiety: F03.90

## 2016-01-21 NOTE — ED Notes (Signed)
Daughter stated she was going to take her home because [facility].  Daughter also stated she needed to go to work at [employer]5AM.  Explained we were going to place her ina room and daughter stated they were going to go home

## 2016-01-29 ENCOUNTER — Inpatient Hospital Stay (HOSPITAL_COMMUNITY)
Admission: EM | Admit: 2016-01-29 | Discharge: 2016-02-03 | DRG: 444 | Disposition: A | Discharge status: Home Health | Payer: Medicare (Managed Care) | Attending: Internal Medicine | Admitting: Internal Medicine

## 2016-01-29 ENCOUNTER — Encounter (HOSPITAL_COMMUNITY): Payer: Self-pay | Admitting: Emergency Medicine

## 2016-01-29 DIAGNOSIS — R10811 Right upper quadrant abdominal tenderness: Secondary | ICD-10-CM

## 2016-01-29 DIAGNOSIS — K2980 Duodenitis without bleeding: Secondary | ICD-10-CM | POA: Diagnosis present

## 2016-01-29 DIAGNOSIS — K8050 Calculus of bile duct without cholangitis or cholecystitis without obstruction: Secondary | ICD-10-CM

## 2016-01-29 DIAGNOSIS — Z86010 Personal history of colonic polyps: Secondary | ICD-10-CM

## 2016-01-29 DIAGNOSIS — Z7902 Long term (current) use of antithrombotics/antiplatelets: Secondary | ICD-10-CM

## 2016-01-29 DIAGNOSIS — R933 Abnormal findings on diagnostic imaging of other parts of digestive tract: Secondary | ICD-10-CM

## 2016-01-29 DIAGNOSIS — K8070 Calculus of gallbladder and bile duct without cholecystitis without obstruction: Secondary | ICD-10-CM | POA: Diagnosis not present

## 2016-01-29 DIAGNOSIS — K862 Cyst of pancreas: Secondary | ICD-10-CM | POA: Diagnosis present

## 2016-01-29 DIAGNOSIS — K2981 Duodenitis with bleeding: Secondary | ICD-10-CM

## 2016-01-29 DIAGNOSIS — E1122 Type 2 diabetes mellitus with diabetic chronic kidney disease: Secondary | ICD-10-CM | POA: Diagnosis present

## 2016-01-29 DIAGNOSIS — Z87891 Personal history of nicotine dependence: Secondary | ICD-10-CM

## 2016-01-29 DIAGNOSIS — R748 Abnormal levels of other serum enzymes: Secondary | ICD-10-CM

## 2016-01-29 DIAGNOSIS — I129 Hypertensive chronic kidney disease with stage 1 through stage 4 chronic kidney disease, or unspecified chronic kidney disease: Secondary | ICD-10-CM | POA: Diagnosis present

## 2016-01-29 DIAGNOSIS — K264 Chronic or unspecified duodenal ulcer with hemorrhage: Secondary | ICD-10-CM | POA: Diagnosis present

## 2016-01-29 DIAGNOSIS — Z8249 Family history of ischemic heart disease and other diseases of the circulatory system: Secondary | ICD-10-CM

## 2016-01-29 DIAGNOSIS — K219 Gastro-esophageal reflux disease without esophagitis: Secondary | ICD-10-CM | POA: Diagnosis present

## 2016-01-29 DIAGNOSIS — I69320 Aphasia following cerebral infarction: Secondary | ICD-10-CM

## 2016-01-29 DIAGNOSIS — R112 Nausea with vomiting, unspecified: Secondary | ICD-10-CM

## 2016-01-29 DIAGNOSIS — N183 Chronic kidney disease, stage 3 unspecified: Secondary | ICD-10-CM | POA: Diagnosis present

## 2016-01-29 DIAGNOSIS — E1151 Type 2 diabetes mellitus with diabetic peripheral angiopathy without gangrene: Secondary | ICD-10-CM | POA: Diagnosis present

## 2016-01-29 DIAGNOSIS — I69359 Hemiplegia and hemiparesis following cerebral infarction affecting unspecified side: Secondary | ICD-10-CM

## 2016-01-29 DIAGNOSIS — Z823 Family history of stroke: Secondary | ICD-10-CM

## 2016-01-29 DIAGNOSIS — Z8673 Personal history of transient ischemic attack (TIA), and cerebral infarction without residual deficits: Secondary | ICD-10-CM

## 2016-01-29 DIAGNOSIS — K269 Duodenal ulcer, unspecified as acute or chronic, without hemorrhage or perforation: Secondary | ICD-10-CM

## 2016-01-29 DIAGNOSIS — K8020 Calculus of gallbladder without cholecystitis without obstruction: Secondary | ICD-10-CM

## 2016-01-29 DIAGNOSIS — F0390 Unspecified dementia without behavioral disturbance: Secondary | ICD-10-CM | POA: Diagnosis present

## 2016-01-29 DIAGNOSIS — R935 Abnormal findings on diagnostic imaging of other abdominal regions, including retroperitoneum: Secondary | ICD-10-CM

## 2016-01-29 DIAGNOSIS — I10 Essential (primary) hypertension: Secondary | ICD-10-CM | POA: Diagnosis present

## 2016-01-29 DIAGNOSIS — N179 Acute kidney failure, unspecified: Secondary | ICD-10-CM | POA: Diagnosis present

## 2016-01-29 DIAGNOSIS — Z886 Allergy status to analgesic agent status: Secondary | ICD-10-CM

## 2016-01-29 DIAGNOSIS — Z79899 Other long term (current) drug therapy: Secondary | ICD-10-CM

## 2016-01-29 DIAGNOSIS — Z90710 Acquired absence of both cervix and uterus: Secondary | ICD-10-CM

## 2016-01-29 DIAGNOSIS — K449 Diaphragmatic hernia without obstruction or gangrene: Secondary | ICD-10-CM | POA: Diagnosis present

## 2016-01-29 DIAGNOSIS — F329 Major depressive disorder, single episode, unspecified: Secondary | ICD-10-CM | POA: Diagnosis present

## 2016-01-29 DIAGNOSIS — R000 Tachycardia, unspecified: Secondary | ICD-10-CM

## 2016-01-29 DIAGNOSIS — Z23 Encounter for immunization: Secondary | ICD-10-CM

## 2016-01-29 DIAGNOSIS — I69322 Dysarthria following cerebral infarction: Secondary | ICD-10-CM

## 2016-01-29 DIAGNOSIS — Z833 Family history of diabetes mellitus: Secondary | ICD-10-CM

## 2016-01-29 DIAGNOSIS — E785 Hyperlipidemia, unspecified: Secondary | ICD-10-CM | POA: Diagnosis present

## 2016-01-29 DIAGNOSIS — Z9049 Acquired absence of other specified parts of digestive tract: Secondary | ICD-10-CM

## 2016-01-29 DIAGNOSIS — R1031 Right lower quadrant pain: Secondary | ICD-10-CM

## 2016-01-29 DIAGNOSIS — Z7984 Long term (current) use of oral hypoglycemic drugs: Secondary | ICD-10-CM

## 2016-01-29 NOTE — ED Triage Notes (Signed)
Per [facility]: Pt to ED from home c/o N/V and loss of appetite x 3 days. Pt was seen and treated here last week for same symptoms. She denies abd pain, fevers, states urine output has been normal. Hx dementia and DM, has speech and R sided deficits d/t old CVA. Pt A&O per baseline. EMS VS: 116/73, P 84 regular, 97% RA, CBG 168.

## 2016-01-30 ENCOUNTER — Emergency Department (HOSPITAL_COMMUNITY): Payer: Medicare (Managed Care)

## 2016-01-30 ENCOUNTER — Inpatient Hospital Stay (HOSPITAL_COMMUNITY): Payer: Medicare (Managed Care)

## 2016-01-30 ENCOUNTER — Encounter (HOSPITAL_COMMUNITY): Payer: Self-pay | Admitting: Family Medicine

## 2016-01-30 DIAGNOSIS — Z8673 Personal history of transient ischemic attack (TIA), and cerebral infarction without residual deficits: Secondary | ICD-10-CM | POA: Diagnosis not present

## 2016-01-30 DIAGNOSIS — K2981 Duodenitis with bleeding: Secondary | ICD-10-CM | POA: Diagnosis not present

## 2016-01-30 DIAGNOSIS — I69359 Hemiplegia and hemiparesis following cerebral infarction affecting unspecified side: Secondary | ICD-10-CM | POA: Diagnosis not present

## 2016-01-30 DIAGNOSIS — R748 Abnormal levels of other serum enzymes: Secondary | ICD-10-CM | POA: Diagnosis present

## 2016-01-30 DIAGNOSIS — K449 Diaphragmatic hernia without obstruction or gangrene: Secondary | ICD-10-CM | POA: Diagnosis present

## 2016-01-30 DIAGNOSIS — K8050 Calculus of bile duct without cholangitis or cholecystitis without obstruction: Secondary | ICD-10-CM | POA: Diagnosis present

## 2016-01-30 DIAGNOSIS — Z823 Family history of stroke: Secondary | ICD-10-CM | POA: Diagnosis not present

## 2016-01-30 DIAGNOSIS — E1151 Type 2 diabetes mellitus with diabetic peripheral angiopathy without gangrene: Secondary | ICD-10-CM | POA: Diagnosis present

## 2016-01-30 DIAGNOSIS — N179 Acute kidney failure, unspecified: Secondary | ICD-10-CM | POA: Diagnosis present

## 2016-01-30 DIAGNOSIS — Z23 Encounter for immunization: Secondary | ICD-10-CM | POA: Diagnosis not present

## 2016-01-30 DIAGNOSIS — K8020 Calculus of gallbladder without cholecystitis without obstruction: Secondary | ICD-10-CM | POA: Diagnosis not present

## 2016-01-30 DIAGNOSIS — F329 Major depressive disorder, single episode, unspecified: Secondary | ICD-10-CM | POA: Diagnosis present

## 2016-01-30 DIAGNOSIS — Z90710 Acquired absence of both cervix and uterus: Secondary | ICD-10-CM | POA: Diagnosis not present

## 2016-01-30 DIAGNOSIS — R933 Abnormal findings on diagnostic imaging of other parts of digestive tract: Secondary | ICD-10-CM | POA: Diagnosis not present

## 2016-01-30 DIAGNOSIS — K219 Gastro-esophageal reflux disease without esophagitis: Secondary | ICD-10-CM | POA: Diagnosis present

## 2016-01-30 DIAGNOSIS — I69320 Aphasia following cerebral infarction: Secondary | ICD-10-CM | POA: Diagnosis not present

## 2016-01-30 DIAGNOSIS — E785 Hyperlipidemia, unspecified: Secondary | ICD-10-CM | POA: Diagnosis present

## 2016-01-30 DIAGNOSIS — K8070 Calculus of gallbladder and bile duct without cholecystitis without obstruction: Secondary | ICD-10-CM | POA: Diagnosis present

## 2016-01-30 DIAGNOSIS — K264 Chronic or unspecified duodenal ulcer with hemorrhage: Secondary | ICD-10-CM | POA: Diagnosis present

## 2016-01-30 DIAGNOSIS — Z9049 Acquired absence of other specified parts of digestive tract: Secondary | ICD-10-CM | POA: Diagnosis not present

## 2016-01-30 DIAGNOSIS — K2980 Duodenitis without bleeding: Secondary | ICD-10-CM | POA: Diagnosis present

## 2016-01-30 DIAGNOSIS — N183 Chronic kidney disease, stage 3 (moderate): Secondary | ICD-10-CM | POA: Diagnosis present

## 2016-01-30 DIAGNOSIS — K862 Cyst of pancreas: Secondary | ICD-10-CM | POA: Diagnosis present

## 2016-01-30 DIAGNOSIS — Z86010 Personal history of colonic polyps: Secondary | ICD-10-CM | POA: Diagnosis not present

## 2016-01-30 DIAGNOSIS — R1031 Right lower quadrant pain: Secondary | ICD-10-CM | POA: Diagnosis not present

## 2016-01-30 DIAGNOSIS — I10 Essential (primary) hypertension: Secondary | ICD-10-CM

## 2016-01-30 DIAGNOSIS — R112 Nausea with vomiting, unspecified: Secondary | ICD-10-CM

## 2016-01-30 DIAGNOSIS — I129 Hypertensive chronic kidney disease with stage 1 through stage 4 chronic kidney disease, or unspecified chronic kidney disease: Secondary | ICD-10-CM | POA: Diagnosis present

## 2016-01-30 DIAGNOSIS — I69322 Dysarthria following cerebral infarction: Secondary | ICD-10-CM | POA: Diagnosis not present

## 2016-01-30 DIAGNOSIS — E1122 Type 2 diabetes mellitus with diabetic chronic kidney disease: Secondary | ICD-10-CM | POA: Diagnosis present

## 2016-01-30 DIAGNOSIS — F0390 Unspecified dementia without behavioral disturbance: Secondary | ICD-10-CM | POA: Diagnosis present

## 2016-01-30 DIAGNOSIS — Z7902 Long term (current) use of antithrombotics/antiplatelets: Secondary | ICD-10-CM | POA: Diagnosis not present

## 2016-01-30 LAB — COMPREHENSIVE METABOLIC PANEL
ALBUMIN: 3.8 g/dL (ref 3.5–5.0)
ALK PHOS: 105 U/L (ref 38–126)
ALT: 79 U/L — AB (ref 14–54)
AST: 34 U/L (ref 15–41)
Anion gap: 13 (ref 5–15)
BILIRUBIN TOTAL: 0.6 mg/dL (ref 0.3–1.2)
BUN: 52 mg/dL — ABNORMAL HIGH (ref 6–20)
CALCIUM: 9.4 mg/dL (ref 8.9–10.3)
CO2: 18 mmol/L — ABNORMAL LOW (ref 22–32)
Chloride: 104 mmol/L (ref 101–111)
Creatinine, Ser: 3.76 mg/dL — ABNORMAL HIGH (ref 0.44–1.00)
GFR calc Af Amer: 12 mL/min — ABNORMAL LOW (ref >60)
GFR calc non Af Amer: 11 mL/min — ABNORMAL LOW (ref >60)
GLUCOSE: 115 mg/dL — AB (ref 65–99)
POTASSIUM: 4.5 mmol/L (ref 3.5–5.1)
Sodium: 135 mmol/L (ref 135–145)
TOTAL PROTEIN: 7.3 g/dL (ref 6.5–8.1)

## 2016-01-30 LAB — CBC WITH DIFFERENTIAL/PLATELET
BASOS ABS: 0 10*3/uL (ref 0.0–0.1)
BASOS PCT: 0 %
Eosinophils Absolute: 0 10*3/uL (ref 0.0–0.7)
Eosinophils Relative: 0 %
HEMATOCRIT: 37.8 % (ref 36.0–46.0)
HEMOGLOBIN: 12.3 g/dL (ref 12.0–15.0)
LYMPHS PCT: 15 %
Lymphs Abs: 0.9 10*3/uL (ref 0.7–4.0)
MCH: 27.6 pg (ref 26.0–34.0)
MCHC: 32.5 g/dL (ref 30.0–36.0)
MCV: 84.9 fL (ref 78.0–100.0)
Monocytes Absolute: 0.6 10*3/uL (ref 0.1–1.0)
Monocytes Relative: 10 %
NEUTROS ABS: 4.5 10*3/uL (ref 1.7–7.7)
NEUTROS PCT: 75 %
Platelets: 265 10*3/uL (ref 150–400)
RBC: 4.45 MIL/uL (ref 3.87–5.11)
RDW: 14.5 % (ref 11.5–15.5)
WBC: 5.9 10*3/uL (ref 4.0–10.5)

## 2016-01-30 LAB — URINALYSIS, ROUTINE W REFLEX MICROSCOPIC
Bilirubin Urine: NEGATIVE
Glucose, UA: NEGATIVE mg/dL
Ketones, ur: NEGATIVE mg/dL
Leukocytes, UA: NEGATIVE
Nitrite: NEGATIVE
Protein, ur: NEGATIVE mg/dL
Specific Gravity, Urine: >1.03 — ABNORMAL HIGH (ref 1.005–1.030)
pH: 5 (ref 5.0–8.0)

## 2016-01-30 LAB — GLUCOSE, CAPILLARY
GLUCOSE-CAPILLARY: 102 mg/dL — AB (ref 65–99)
Glucose-Capillary: 80 mg/dL (ref 65–99)
Glucose-Capillary: 109 mg/dL — ABNORMAL HIGH (ref 65–99)
Glucose-Capillary: 100 mg/dL — ABNORMAL HIGH (ref 65–99)

## 2016-01-30 LAB — URINALYSIS, MICROSCOPIC (REFLEX): WBC UA: NONE SEEN WBC/hpf (ref 0–5)

## 2016-01-30 LAB — I-STAT TROPONIN, ED: Troponin i, poc: 0 ng/mL (ref 0.00–0.08)

## 2016-01-30 LAB — LIPASE, BLOOD: Lipase: 18 U/L (ref 11–51)

## 2016-01-30 LAB — ACETAMINOPHEN LEVEL

## 2016-01-30 LAB — SALICYLATE LEVEL: Salicylate Lvl: <7 mg/dL (ref 2.8–30.0)

## 2016-01-30 LAB — TROPONIN I: Troponin I: <0.03 ng/mL (ref <0.03)

## 2016-01-30 MED ORDER — LORATADINE 10 MG PO TABS: 10.0000 mg | ORAL_TABLET | Freq: Every day | ORAL | Status: DC | PRN

## 2016-01-30 MED ORDER — SODIUM CHLORIDE 0.9 % IV SOLN
INTRAVENOUS | Status: AC
  Administered 2016-01-30: 07:00:00 via INTRAVENOUS

## 2016-01-30 MED ORDER — SODIUM CHLORIDE 0.9 % IV BOLUS (SEPSIS)
500.0000 mL | Freq: Once | INTRAVENOUS | Status: AC
  Administered 2016-01-30: 500 mL via INTRAVENOUS

## 2016-01-30 MED ORDER — FENTANYL CITRATE (PF) 100 MCG/2ML IJ SOLN: 12.5000 ug | INTRAMUSCULAR | Status: DC | PRN

## 2016-01-30 MED ORDER — HEPARIN SODIUM (PORCINE) 5000 UNIT/ML IJ SOLN
5000.0000 [IU] | Freq: Three times a day (TID) | INTRAMUSCULAR | Status: AC
  Administered 2016-01-30 – 2016-01-31 (×5): 5000 [IU] via SUBCUTANEOUS
  Filled 2016-01-30 (×4): qty 1

## 2016-01-30 MED ORDER — INFLUENZA VAC SPLIT QUAD 0.5 ML IM SUSY: 0.5000 mL | PREFILLED_SYRINGE | INTRAMUSCULAR | Status: DC

## 2016-01-30 MED ORDER — VITAMIN D 1000 UNITS PO TABS
1000.0000 [IU] | ORAL_TABLET | Freq: Every day | ORAL | Status: DC
  Administered 2016-01-30 – 2016-02-03 (×5): 1000 [IU] via ORAL
  Filled 2016-01-30 (×5): qty 1

## 2016-01-30 MED ORDER — ONDANSETRON HCL 4 MG PO TABS: 4.0000 mg | ORAL_TABLET | Freq: Four times a day (QID) | ORAL | Status: DC | PRN

## 2016-01-30 MED ORDER — BISACODYL 5 MG PO TBEC: 5.0000 mg | DELAYED_RELEASE_TABLET | Freq: Every day | ORAL | Status: DC | PRN

## 2016-01-30 MED ORDER — ACETAMINOPHEN 325 MG PO TABS
650.0000 mg | ORAL_TABLET | Freq: Four times a day (QID) | ORAL | Status: DC | PRN
  Administered 2016-01-30: 650 mg via ORAL
  Filled 2016-01-30: qty 2

## 2016-01-30 MED ORDER — ONDANSETRON HCL 4 MG/2ML IJ SOLN: 4.0000 mg | Freq: Four times a day (QID) | INTRAMUSCULAR | Status: DC | PRN

## 2016-01-30 MED ORDER — SODIUM CHLORIDE 0.9% FLUSH
3.0000 mL | Freq: Two times a day (BID) | INTRAVENOUS | Status: DC
  Administered 2016-01-30 – 2016-02-03 (×7): 3 mL via INTRAVENOUS

## 2016-01-30 MED ORDER — SENNOSIDES-DOCUSATE SODIUM 8.6-50 MG PO TABS: 1.0000 | ORAL_TABLET | Freq: Every day | ORAL | Status: DC | PRN

## 2016-01-30 MED ORDER — ATORVASTATIN CALCIUM 40 MG PO TABS
40.0000 mg | ORAL_TABLET | Freq: Every day | ORAL | Status: DC
  Administered 2016-01-30 – 2016-02-03 (×5): 40 mg via ORAL
  Filled 2016-01-30 (×5): qty 1

## 2016-01-30 MED ORDER — METOPROLOL TARTRATE 5 MG/5ML IV SOLN
2.5000 mg | Freq: Once | INTRAVENOUS | Status: AC
  Administered 2016-01-30: 2.5 mg via INTRAVENOUS
  Filled 2016-01-30: qty 5

## 2016-01-30 MED ORDER — HYDROCORTISONE ACETATE 25 MG RE SUPP: 25.0000 mg | Freq: Two times a day (BID) | RECTAL | Status: DC | PRN

## 2016-01-30 NOTE — ED Notes (Signed)
Patient transported to CT 

## 2016-01-30 NOTE — Progress Notes (Signed)
    Spoke to ED early this AM  Reviewed chart  Looked at CT and US which give conflicting info re: choledocholithiasis  I am not ready to do an ERCP based upon this info  We will see patient later today  She probably will need an MRCP w?o contrast but we should likely not do that if not confident she can perform long breath holds.  Iva Booparl E. Kayren Holck, MD, [facility] [phone] (pager) [phone] after 5 PM, weekends and holidays  01/30/2016 8:04 AM

## 2016-01-30 NOTE — ED Notes (Signed)
Patient transported to x-ray/US.

## 2016-01-30 NOTE — ED Notes (Signed)
Hannah, PA-C at  bedside.

## 2016-01-30 NOTE — ED Notes (Signed)
Daughter: Laymond PurserDenise Rout [phone]  Call with plan for pt

## 2016-01-30 NOTE — Progress Notes (Signed)
New Admission Note:  Arrival Method: Stretch from ED Mental Orientation: Alert and Orient X3 Telemetry: Yes. Confirmed with CCMD Assessment: Completed Skin: No issues Iv: Left AC 20 Gauge Pain: 0/10 Tubes: None Safety Measures: Safety Fall Prevention Plan discussed with patient. Admission: Completed [address] Orientation: Patient has been orientated to the room, unit and the staff.  Orders have been reviewed and implemented. Will continue to monitor the patient. Call light has been placed within reach and bed alarm has been activated.   Aram CandelaJequetta Malkia Nippert, RN  Phone Number: [phone]

## 2016-01-30 NOTE — ED Notes (Signed)
Pt laying in bed and HR 139, pt denies feeling lightheaded or like her heart is racing. Dr Blinda LeatherwoodPollina notified and given EKG

## 2016-01-30 NOTE — ED Notes (Signed)
Pt returned to room and placed back on monitor.  

## 2016-01-30 NOTE — ED Provider Notes (Signed)
[facility]-EMER[facility]ENCY DEPT Provider Note   CSN: [id number] Arrival date & time: 01/29/16  2330     History   Chief Complaint Chief Complaint  Patient presents with  . Nausea  . Emesis    HPI [name] is a 81 y.o. female with a hx of dementia, NIDDM, HTN, CKD, CVA (residual RUE weakness and speech deficits) presents to the Emergency Department complaining of gradual, persistent, progressively worsening N/V/D and associated abd pain and loss of appetite onset 1 week ago.  Pt brought to the ED for eval on 01/20/16 for same, but left before she was seen.  Record review shows elevated LFTs at that visit.  Pt c/o RUQ abd pain, slightly improved over the last 3-4 days.  No aggravating of alleviating factors.  Pt denies fever, chills, headache, CP, SOB, melena, hematochezia, hematemesis, urinary ssx.  No sick contacts.  Normal urine output. Pt lives at home with daughter.  No family at bedside.      LEVEL 5 CAVEAT for dementia.   The history is provided by the patient and medical records. No language interpreter was used.    Past Medical History:  Diagnosis Date  . Arthritis    hands  . Asthma   . Chronic kidney disease (CKD), stage III (moderate)   . Dementia   . Depression   . Diabetes mellitus without complication (HCC)   . Diverticulosis   . Hx of adenomatous colonic polyps   . Hyperlipidemia   . Hypertension   . Hypochromic anemia   . Internal hemorrhoids   . Stroke [facility]) May 2009   multiple, last one Nov 2012, 2 strokes with right sided deficits and slurred speech    Patient Active Problem List   Diagnosis Date Noted  . Choledocholithiasis 01/30/2016  . Bleeding 11/23/2015  . Chronic kidney disease 05/29/2012  . Skin ulcer, stage 2 (HCC) 05/29/2012  . Internal hemorrhoid, bleeding 05/29/2012  . Severe protein-calorie malnutrition (HCC) 03/24/2012  . Hyponatremia 03/23/2012  . Hypokalemia 03/23/2012  . Urinary tract infection 03/23/2012  . Physical deconditioning  03/23/2012  . CKD (chronic kidney disease) stage 3, GFR 30-59 ml/min 12/01/2010  . Encephalopathy 12/01/2010  . TIA (transient ischemic attack) 11/28/2010  . ASTHMA 08/25/2008  . COLONIC POLYPS, ADENOMATOUS, HX OF 08/25/2008  . HYPERLIPIDEMIA 08/17/2008  . Microcytic hypochromic anemia 08/17/2008  . HYPERTENSION 08/17/2008  . CVA history  08/17/2008  . DIVERTICULITIS, COLON 08/17/2008  . HYPERGLYCEMIA 08/17/2008    Past Surgical History:  Procedure Laterality Date  . ABDOMINAL HYSTERECTOMY    . ABDOMINAL SURGERY    . COLECTOMY    . FLEXIBLE SIGMOIDOSCOPY N/A 05/28/2012   Procedure: FLEXIBLE SIGMOIDOSCOPY;  Surgeon: Hart Carwin, MD;  Location: [facility] ENDOSCOPY;  Service: Endoscopy;  Laterality: N/A;  . OTHER SURGICAL HISTORY     2 ear surgeries  . PERIPHERAL VASCULAR CATHETERIZATION N/A 07/21/2014   Procedure: Abdominal Aortogram w/Lower Extremity;  Surgeon: Nada Libman, MD;  Location: [facility] INVASIVE CV LAB;  Service: Cardiovascular;  Laterality: N/A;  . PERIPHERAL VASCULAR CATHETERIZATION N/A 11/23/2015   Procedure: Abdominal Aortogram w/Lower Extremity;  Surgeon: Nada Libman, MD;  Location: [facility] INVASIVE CV LAB;  Service: Cardiovascular;  Laterality: N/A;  . PERIPHERAL VASCULAR CATHETERIZATION  11/23/2015   Procedure: Peripheral Vascular Intervention;  Surgeon: Nada Libman, MD;  Location: [facility] INVASIVE CV LAB;  Service: Cardiovascular;;  Failed PVI of AT and Peroneal on left side  . SUBTOTAL COLECTOMY  2010   for stricturing from  bouts of diverticulitis along with hx recurrent diverticular bleeds.  the surgery was a total abdominal colec  . TONSILLECTOMY    . VASCULAR SURGERY      OB History    No data available       Home Medications    Prior to Admission medications   Medication Sig Start Date End Date Taking? Authorizing Provider  acetaminophen (TYLENOL) 325 MG tablet Take 650 mg by mouth every 6 (six) hours as needed (for pain/fever).     Historical Provider, MD    atorvastatin (LIPITOR) 40 MG tablet Take 40 mg by mouth daily.    Historical Provider, MD  Cholecalciferol (VITAMIN D3) 1000 UNITS CAPS Take 1,000 Units by mouth daily.     Historical Provider, MD  clopidogrel (PLAVIX) 75 MG tablet Take 75 mg by mouth daily.    Historical Provider, MD  hydrocortisone (ANUSOL-HC) 25 MG suppository Place 25 mg rectally 2 (two) times daily as needed for hemorrhoids.     Historical Provider, MD  loratadine (CLARITIN) 10 MG tablet Take 10 mg by mouth daily as needed for allergies.     Historical Provider, MD  losartan-hydrochlorothiazide (HYZAAR) 50-12.5 MG tablet Take 1 tablet by mouth daily.    Historical Provider, MD  sennosides-docusate sodium (SENOKOT-S) 8.6-50 MG tablet Take 1 tablet by mouth daily as needed for constipation.     Historical Provider, MD    Family History Family History  Problem Relation Age of Onset  . Diabetes Paternal Aunt   . Stroke Father   . Heart disease Father   . Diabetes Father   . Depression Father   . HIV Brother   . Prostate cancer    . Cancer Sister     unknown type  . Colon cancer Neg Hx     Social History Social History  Substance Use Topics  . Smoking status: Former Games developer  . Smokeless tobacco: Never Used  . Alcohol use No     Allergies   Aspirin   Review of Systems Review of Systems  Gastrointestinal: Positive for abdominal pain, diarrhea, nausea and vomiting.  All other systems reviewed and are negative.    Physical Exam Updated Vital Signs BP 118/63   Pulse 75   Temp 98.3 F (36.8 C)   Resp 16   SpO2 100%   Physical Exam  Constitutional: She appears well-developed and well-nourished. No distress.  Awake, alert, nontoxic appearance  HENT:  Head: Normocephalic and atraumatic.  Mouth/Throat: Oropharynx is clear and moist. No oropharyngeal exudate.  Eyes: Conjunctivae are normal. No scleral icterus.  Neck: Normal range of motion. Neck supple.  Cardiovascular: Normal rate, regular rhythm  and intact distal pulses.   Pulmonary/Chest: Effort normal and breath sounds normal. No respiratory distress. She has no wheezes.  Equal chest expansion  Abdominal: Soft. Bowel sounds are normal. She exhibits no mass. There is tenderness in the right upper quadrant and right lower quadrant. There is guarding ( in the RLQ only, but noted discomfort along the entire right side ). There is no rigidity and no rebound.  Musculoskeletal: Normal range of motion. She exhibits no edema.  Neurological: She is alert.  Speech is clear and goal oriented Moves extremities without ataxia  Skin: Skin is warm and dry. She is not diaphoretic.  Psychiatric: She has a normal mood and affect.  Nursing note and vitals reviewed.    ED Treatments / Results  Labs (all labs ordered are listed, but only abnormal results are displayed) Labs  Reviewed  COMPREHENSIVE METABOLIC PANEL - Abnormal; Notable for the following:       Result Value   CO2 18 (*)    Glucose, Bld 115 (*)    BUN 52 (*)    Creatinine, Ser 3.76 (*)    ALT 79 (*)    GFR calc non Af Amer 11 (*)    GFR calc Af Amer 12 (*)    All other components within normal limits  URINALYSIS, ROUTINE W REFLEX MICROSCOPIC - Abnormal; Notable for the following:    Specific Gravity, Urine >1.030 (*)    Hgb urine dipstick TRACE (*)    All other components within normal limits  ACETAMINOPHEN LEVEL - Abnormal; Notable for the following:    Acetaminophen (Tylenol), Serum <10 (*)    All other components within normal limits  URINALYSIS, MICROSCOPIC (REFLEX) - Abnormal; Notable for the following:    Bacteria, UA RARE (*)    Squamous Epithelial / LPF 0-5 (*)    All other components within normal limits  CBC WITH DIFFERENTIAL/PLATELET  LIPASE, BLOOD  SALICYLATE LEVEL  TROPONIN I  HEPATITIS PANEL, ACUTE  I-STAT TROPOININ, ED    EKG  EKG Interpretation  Date/Time:  Sunday January 30 2016 00:49:51 EST Ventricular Rate:  79 PR Interval:    QRS  Duration: 80 QT Interval:  381 QTC Calculation: 437 R Axis:   58 Text Interpretation:  Sinus rhythm Abnormal R-wave progression, early transition No significant change since last tracing Confirmed by POLLINA  MD, CHRISTOPHER [phone]) on 01/30/2016 1:27:48 AM       EKG Interpretation  Date/Time:  Sunday January 30 2016 00:49:51 EST Ventricular Rate:  79 PR Interval:    QRS Duration: 80 QT Interval:  381 QTC Calculation: 437 R Axis:   58 Text Interpretation:  Sinus rhythm Abnormal R-wave progression, early transition No significant change since last tracing Confirmed by POLLINA  MD, CHRISTOPHER [phone]) on 01/30/2016 1:27:48 AM       Radiology Ct Abdomen Pelvis Wo Contrast  Result Date: 01/30/2016 CLINICAL DATA:  81 y/o F; right lower quadrant abdominal pain. History colectomy, internal hemorrhoids, adenomatous colonic polyps, diverticulosis, diabetes, chronic kidney disease, and hysterectomy. EXAM: CT ABDOMEN AND PELVIS WITHOUT CONTRAST TECHNIQUE: Multidetector CT imaging of the abdomen and pelvis was performed following the standard protocol without IV contrast. COMPARISON:  06/11/2009 CT abdomen and pelvis. 01/30/2016 abdominal ultrasound. FINDINGS: Lower chest: Segmental ground-glass opacities in the right lower lobe along the major fissure probably represent areas of atelectasis. Hepatobiliary: No focal liver abnormality. Gallstones. No intra or extrahepatic biliary ductal dilatation. Pancreas: Unremarkable. No pancreatic ductal dilatation or surrounding inflammatory changes. Spleen: Normal in size without focal abnormality. Adrenals/Urinary Tract: Ectopic malrotated left kidney. Right kidney upper pole punctate nonobstructing stone. No hydronephrosis. Collapsed bladder. Stomach/Bowel: No obstructive or inflammatory changes of the bowel are identified. Status post colectomy with mild increase in dilatation of small bowel proximal to the rectum. Vascular/Lymphatic: Aortic atherosclerosis. No  enlarged abdominal or pelvic lymph nodes. Reproductive: No abdominal wall hernia or abnormality. No abdominopelvic ascites. Other: Musculoskeletal: Interval progressive degenerative changes of the lumbar spine with increasing lower lumbar facet arthropathy and new grade 1 L4-5 degenerative anterolisthesis. No acute osseous abnormality is identified. Transitional L5 vertebral body with articulation of transverse processes with the sacral alae bilaterally. IMPRESSION: 1. Mild increase in dilatation of small bowel anastomosis proximal to the rectum of uncertain significance. No definite obstructive or inflammatory changes of bowel identified. 2. Right kidney upper pole nonobstructing stone. 3.  Gallstones. 4. Ectopic malrotated left kidney, stable. 5. Aortic atherosclerosis. 6. Interval mild progression of lumbar spine degenerative changes. No acute osseous abnormality identified. Electronically Signed   By: Mitzi Hansen M.D.   On: 01/30/2016 03:47   Dg Chest 2 View  Result Date: 01/30/2016 CLINICAL DATA:  81 y/o F; nausea, vomiting, and loss of appetite for 3 days. EXAM: CHEST  2 VIEW COMPARISON:  01/20/2016 chest radiograph FINDINGS: Patient is rotated. The heart size and mediastinal contours are within normal limits and stable given projection and technique. Both lungs are clear. The visualized skeletal structures are unremarkable. Aortic atherosclerosis with calcification. IMPRESSION: No active cardiopulmonary disease.  Aortic atherosclerosis. Electronically Signed   By: Mitzi Hansen M.D.   On: 01/30/2016 01:38   US Abdomen Complete  Result Date: 01/30/2016 CLINICAL DATA:  Elevated LFTs, upper abdominal pain and nausea vomiting EXAM: ABDOMEN ULTRASOUND COMPLETE COMPARISON:  CT 06/11/2009 FINDINGS: Gallbladder: Echogenic stones are present in the gallbladder. A round non mobile soft tissue echogenicity measuring 8 mm is also visualized and may represent a polyp. Normal wall thickness. No  sonographic Murphy's. Common bile duct: Diameter: Slightly enlarged measuring 9 mm in maximum diameter. There is a small echogenic stone in the proximal common bile duct. Liver: No focal lesion identified. Within normal limits in parenchymal echogenicity. Mild intra hepatic biliary dilatation suggested. IVC: No abnormality visualized. Pancreas: Poorly evaluated due to bowel gas Spleen: Size and appearance within normal limits. Right Kidney: Length: 9.5 cm. Echogenicity within normal limits. No mass or hydronephrosis visualized. Left Kidney: Length: 6.5 cm. Located in the low left abdomen. Slightly dysmorphic. No hydronephrosis Abdominal aorta: Obscured by bowel gas Other findings: None. IMPRESSION: 1. Gallstones and probable polyp in the gallbladder. No sonographic evidence for acute cholecystitis 2. Slightly enlarged common bowel duct up to 9 mm with mild intra hepatic biliary dilatation. 5 mm echogenic stone present in the proximal common bowel duct. 3. Ectopic left kidney located in the low left abdomen. Kidney is somewhat small and dysmorphic in appearance. 4. Pancreas and aorta are poorly evaluated due to bowel gas Electronically Signed   By: Jasmine Pang M.D.   On: 01/30/2016 02:29    Procedures Procedures (including critical care time)  Medications Ordered in ED Medications  sodium chloride 0.9 % bolus 500 mL (0 mLs Intravenous Stopped 01/30/16 0330)  sodium chloride 0.9 % bolus 500 mL (500 mLs Intravenous New Bag/Given 01/30/16 0426)  metoprolol (LOPRESSOR) injection 2.5 mg (2.5 mg Intravenous Given 01/30/16 0427)     Initial Impression / Assessment and Plan / ED Course  I have reviewed the triage vital signs and the nursing notes.  Pertinent labs & imaging results that were available during my care of the patient were reviewed by me and considered in my medical decision making (see chart for details).  Clinical Course as of Jan 30 431  Wynelle Link Jan 30, 2016  0252 Acute on chronic renal failure  Creatinine: (!) 3.76 [HM]  0252 Improvement of LFTs AST: 34 [HM]  0252 No evidence of UTI Leukocytes, UA: NEGATIVE [HM]  0252 No evidence of pneumonia or pneumothorax DG Chest 2 View [HM]  0252 Gallstones and proximal common bile duct stone US Abdomen Complete [HM]  0256 Pt sees [facility] (GI bleed in 2014)  [HM]  0339 Pt was discussed with Dr. Stan Head of [facility] who will consult on patient  [HM]  0419 Pt now with tachycardia to 138, Regular rhythm.  Will give lopressor 2.5mg   [  HM]  [phone] Discussed with Dr. Antionette Charpyd who will admit.    [HM]  [phone] The patient was discussed with and seen by Dr. Blinda LeatherwoodPollina who agrees with the treatment plan.   [HM]    Clinical Course User Index [HM] Dahlia ClientHannah Renne Platts, PA-C    She presents with persistent abdominal pain and vomiting 1 week. Elevated LFTs at previous visit where patient was not evaluated by provider. Patient well appearing tonight. Ultrasound shows choledocholithiasis. CT scan otherwise unremarkable. No evidence of appendicitis. Labs that show acute on chronic kidney failure. Patient given fluids.  She will be admitted for further management.  Final Clinical Impressions(s) / ED Diagnoses   Final diagnoses:  Choledocholithiasis  Non-intractable vomiting with nausea, unspecified vomiting type  AKI (acute kidney injury) (HCC)  Tachycardia    New Prescriptions New Prescriptions   No medications on file     Dierdre ForthHannah Sanaia Jasso, PA-C 01/30/16 0434    Gilda Creasehristopher J Pollina, MD 01/30/16 [phone]

## 2016-01-30 NOTE — Consult Note (Signed)
Consultation  Referring Provider:   Dr. Cena Benton   Primary Care Physician:  [name], MD Primary Gastroenterologist:   Dr. Juanda Chance (retired) Reason for Consultation:   Choledocholithiasis    Impression / Plan:   Impression: 1. Abnormal abdominal imaging: See ultrasound above questioning possible choledocholithiasis, CT is contradictory to this; question whether patient truly has choledocholithiasis versus passage of gallbladder stone vs other 2. Elevated liver function tests: Patient was seen 10 days ago with elevated liver function tests an AST of 394, ALT 79 and normal bilirubin of 0.9, today these have mostly resolved, though ALT remains slightly elevated at 79. 3. CKD: Creatinine up to 3.76 from patient's baseline around 1.8 4. GERD: per family, previously diagnosed and on medicine? None on list-if pt continues with feeling of something in her throat could add Zantac 150 qd  Plan: 1. Ordered an MRCP for further evaluation of possible choledocholithiasis, this will be without contrast due to patient's elevated creatinine 2. Remain nothing by mouth until after time of imaging above and further recommendations 3. Continue supportive measures 4. At some point in the future could discuss Colestyramine for her diarrhea as this is chronic for the patient after her partial colectomy 5. Hepatitis panel previously ordered and pending 6. Please await further recommendations from Dr. Leone Payor  Thank you for your kind consultation, we will continue to follow.  [name]  01/30/2016, 10:41 AM Pager #: [phone]     [place] [facility] Attending   I have taken an interval history, reviewed the chart and examined the patient. I agree with the Advanced Practitioner's note, impression and recommendations.   I am not convinced she has a CBD stone and want clarification with MRCP.   Iva Boop, MD, [facility] [phone] (pager) [phone] after  5 PM, weekends and holidays  01/30/2016 1:23 PM           HPI:   [name] is a 81 y.o. African-American female with a past medical history of arthritis, chronic kidney disease stage III, dementia, depression, diabetes, hyperlipidemia, hypertension and stroke 2 with the last one November 2012 maintained on Plavix with residual dysarthria and hemiparesis as well as dementia, who presented to the ED early this morning, 01/30/16 with a complaint of abdominal pain associated with nausea and vomiting.   Per review of chart patient was in the ED on 01/20/16 for the same symptoms  but left before she was seen. At that visit LFTs were elevated.   Today, the patient tells me that she has had continued right-sided abdominal pain which is "towards the bottom", since being seen in the ED last. She notes that she started with nausea and some vomiting over the past week which has been worsening and increasing frequency. The patient's family is by her bedside and tells me that she has chronic diarrhea ever since removal of part of her colon for diverticulitis in 2010. They describe that she has too many bowel movements per day to count and they occur typically after she eats. There has been no change in the frequency of these per her family. Patient denies further nausea or vomiting since being admitted. She does continue with 8/10 right lower quadrant abdominal pain which is worse with movement. Pain seems to be intermittent and sharp in nature. Associated symptoms include loss of appetite over the past week.   At time of my exam patient also complains that she "feels like something is in her throat". Per  her family she has been diagnosed with reflux in the past and typically takes medication for this at home.   Patient denies fever, chills, blood in her stool, melena, change in bowel habits or weight loss.  ED course: Patient was found to be afebrile, saturating well on room air in with vital signs stable. Chest  x-ray was negative for acute cardiopulmonary disease. EKG future to sinus rhythm. Chemistries notable for a B 52 and creatinine 3.76 from her baseline around 1.9. Chemistries remarkable for MALT of 79 improved from 1 week ago when anal T was 159 and AST was 394. CBC is unremarkable and troponin was undetectable. Abdominal ultrasound showed cholelithiasis without acute cholecystitis as well as slight increased dilation of common bile duct 9 mm with 5 mm stone within the proximal common bile duct. CT was then obtained notable for mild increase in dilation of the small bowel anastomosis with no definite obstruction or inflammation.  Previous GI history: 05/28/12-flex sigmoidoscopy, Dr. Juanda Chance: Large internal hemorrhoids, hemorrhoidal band ligation 7 proximal to the dentate line, status post subtotal colectomy for benign disease, widely patent anastomosis at 20 cm, no diverticuli distal to the anastomosis 05/14/06-colonoscopy, Dr. Juanda Chance: Doses in the sigmoid colon, constriction 40 cm from the anus, edematous mucosa and spasms, many diverticuli, tortuous Lemmon and poor prep  Past Medical History:  Diagnosis Date  . Arthritis    hands  . Asthma   . Chronic kidney disease (CKD), stage III (moderate)   . Dementia   . Depression   . Diabetes mellitus without complication (HCC)   . Diverticulosis   . Hx of adenomatous colonic polyps   . Hyperlipidemia   . Hypertension   . Hypochromic anemia   . Internal hemorrhoids   . Stroke [facility]) May 2009   multiple, last one Nov 2012, 2 strokes with right sided deficits and slurred speech    Past Surgical History:  Procedure Laterality Date  . ABDOMINAL HYSTERECTOMY    . ABDOMINAL SURGERY    . COLECTOMY    . FLEXIBLE SIGMOIDOSCOPY N/A 05/28/2012   Procedure: FLEXIBLE SIGMOIDOSCOPY;  Surgeon: Hart Carwin, MD;  Location[facility] ENDOSCOPY;  Service: Endoscopy;  Laterality: N/A;  . OTHER SURGICAL HISTORY     2 ear surgeries  . PERIPHERAL VASCULAR CATHETERIZATION  N/A 07/21/2014   Procedure: Abdominal Aortogram w/Lower Extremity;  Surgeon: Nada Libman, MD;  Location: [facility] INVASIVE CV LAB;  Service: Cardiovascular;  Laterality: N/A;  . PERIPHERAL VASCULAR CATHETERIZATION N/A 11/23/2015   Procedure: Abdominal Aortogram w/Lower Extremity;  Surgeon: Nada Libman, MD;  Location: [facility] INVASIVE CV LAB;  Service: Cardiovascular;  Laterality: N/A;  . PERIPHERAL VASCULAR CATHETERIZATION  11/23/2015   Procedure: Peripheral Vascular Intervention;  Surgeon: Nada Libman, MD;  Location: [facility] INVASIVE CV LAB;  Service: Cardiovascular;;  Failed PVI of AT and Peroneal on left side  . SUBTOTAL COLECTOMY  2010   for stricturing from bouts of diverticulitis along with hx recurrent diverticular bleeds.  the surgery was a total abdominal colec  . TONSILLECTOMY    . VASCULAR SURGERY      Family History  Problem Relation Age of Onset  . Diabetes Paternal Aunt   . Stroke Father   . Heart disease Father   . Diabetes Father   . Depression Father   . HIV Brother   . Prostate cancer    . Cancer Sister     unknown type  . Colon cancer Neg Hx     Social History  Substance Use Topics  . Smoking status: Former Games developer  . Smokeless tobacco: Never Used  . Alcohol use No    Prior to Admission medications   Medication Sig Start Date End Date Taking? Authorizing Provider  acetaminophen (TYLENOL) 325 MG tablet Take 650 mg by mouth every 6 (six) hours as needed (for pain/fever).    Yes Historical Provider, MD  atorvastatin (LIPITOR) 40 MG tablet Take 40 mg by mouth daily.   Yes Historical Provider, MD  Cholecalciferol (VITAMIN D3) 1000 UNITS CAPS Take 1,000 Units by mouth daily.    Yes Historical Provider, MD  clopidogrel (PLAVIX) 75 MG tablet Take 75 mg by mouth daily.   Yes Historical Provider, MD  hydrocortisone (ANUSOL-HC) 25 MG suppository Place 25 mg rectally 2 (two) times daily as needed for hemorrhoids.    Yes Historical Provider, MD  loratadine (CLARITIN) 10 MG  tablet Take 10 mg by mouth daily as needed for allergies.    Yes Historical Provider, MD  losartan-hydrochlorothiazide (HYZAAR) 50-12.5 MG tablet Take 1 tablet by mouth daily.   Yes Historical Provider, MD  sennosides-docusate sodium (SENOKOT-S) 8.6-50 MG tablet Take 1 tablet by mouth daily as needed for constipation.    Yes Historical Provider, MD    Current Facility-Administered Medications  Medication Dose Route Frequency Provider Last Rate Last Dose  . 0.9 %  sodium chloride infusion   Intravenous Continuous Briscoe Deutscher, MD 100 mL/hr at 01/30/16 0657    . acetaminophen (TYLENOL) tablet 650 mg  650 mg Oral Q6H PRN Briscoe Deutscher, MD      . atorvastatin (LIPITOR) tablet 40 mg  40 mg Oral Daily Lavone Neri Opyd, MD      . bisacodyl (DULCOLAX) EC tablet 5 mg  5 mg Oral Daily PRN Briscoe Deutscher, MD      . cholecalciferol (VITAMIN D) tablet 1,000 Units  1,000 Units Oral Daily Lavone Neri Opyd, MD      . fentaNYL (SUBLIMAZE) injection 12.5-25 mcg  12.5-25 mcg Intravenous Q2H PRN Briscoe Deutscher, MD      . heparin injection 5,000 Units  5,000 Units Subcutaneous Q8H Lavone Neri Opyd, MD      . hydrocortisone (ANUSOL-HC) suppository 25 mg  25 mg Rectal BID PRN Briscoe Deutscher, MD      . Melene Muller ON 01/31/2016] Influenza vac split quadrivalent PF (FLUARIX) injection 0.5 mL  0.5 mL Intramuscular Tomorrow-1000 Penny Pia, MD      . loratadine (CLARITIN) tablet 10 mg  10 mg Oral Daily PRN Briscoe Deutscher, MD      . ondansetron (ZOFRAN) tablet 4 mg  4 mg Oral Q6H PRN Briscoe Deutscher, MD       Or  . ondansetron (ZOFRAN) injection 4 mg  4 mg Intravenous Q6H PRN Briscoe Deutscher, MD      . senna-docusate (Senokot-S) tablet 1 tablet  1 tablet Oral Daily PRN Briscoe Deutscher, MD      . sodium chloride flush (NS) 0.9 % injection 3 mL  3 mL Intravenous Q12H Briscoe Deutscher, MD        Allergies as of 01/29/2016 - Review Complete 01/29/2016  Allergen Reaction Noted  . Aspirin Nausea And Vomiting      Review of Systems:      Constitutional: Positive for weakness No fever or chills HEENT: Eyes: No change in vision               Ears, Nose, Throat:  No change  in hearing Cardiovascular: No chest pain Respiratory: No SOB Gastrointestinal: See HPI and otherwise negative Genitourinary: No dysuria or change in urinary frequency Neurological: No headache Musculoskeletal: No new muscle or joint pain Hematologic: No bleeding Psychiatric: Positive for dementia No history of depression or anxiety   Physical Exam:  Vital signs in last 24 hours: Temp:  [97.9 F (36.6 C)-98.3 F (36.8 C)] 98 F (36.7 C) (01/07 1000) Pulse Rate:  [74-88] 74 (01/07 1000) Resp:  [11-16] 14 (01/07 1000) BP: (115-150)/(40-82) 119/40 (01/07 1000) SpO2:  [94 %-100 %] 98 % (01/07 1000) Weight:  [135 lb 5.8 oz (61.4 kg)] 135 lb 5.8 oz (61.4 kg) (01/07 0627) Last BM Date: 01/30/16 General:   Very Pleasant elderly African-American female appears to be in NAD, Well developed, Well nourished, alert and cooperative Head:  Normocephalic and atraumatic. Eyes:   PEERL, EOMI. No icterus. Conjunctiva pink. Ears:  Normal auditory acuity. Neck:  Supple Throat: Oral cavity and pharynx without inflammation, swelling or lesion.  Lungs: Respirations even and unlabored. Lungs clear to auscultation bilaterally.   No wheezes, crackles, or rhonchi.  Heart: Normal S1, S2. No MRG. Regular rate and rhythm. No peripheral edema, cyanosis or pallor.  Abdomen:  Soft, nondistended, moderate tenderness to palpation in the right lower quadrant. No rebound or guarding. Normal bowel sounds. No appreciable masses or hepatomegaly. Rectal:  Not performed.  Msk:  Symmetrical without gross deformities.  Extremities:  Without edema, no deformity or joint abnormality. Neurologic:  Alert and  oriented x4;  grossly normal neurologically.  Skin:   Dry and intact without significant lesions or rashes. Psychiatric: Demonstrates good judgement and reason without abnormal affect  or behaviors. Patient does mumble and is hard to understand, but family helps   LAB RESULTS:  Recent Labs  01/30/16 0037  WBC 5.9  HGB 12.3  HCT 37.8  PLT 265   BMET  Recent Labs  01/30/16 0037  NA 135  K 4.5  CL 104  CO2 18*  GLUCOSE 115*  BUN 52*  CREATININE 3.76*  CALCIUM 9.4   LFT  Recent Labs  01/30/16 0037  PROT 7.3  ALBUMIN 3.8  AST 34  ALT 79*  ALKPHOS 105  BILITOT 0.6    STUDIES: Images viewed Ct Abdomen Pelvis Wo Contrast  Result Date: 01/30/2016 CLINICAL DATA:  81 y/o F; right lower quadrant abdominal pain. History colectomy, internal hemorrhoids, adenomatous colonic polyps, diverticulosis, diabetes, chronic kidney disease, and hysterectomy. EXAM: CT ABDOMEN AND PELVIS WITHOUT CONTRAST TECHNIQUE: Multidetector CT imaging of the abdomen and pelvis was performed following the standard protocol without IV contrast. COMPARISON:  06/11/2009 CT abdomen and pelvis. 01/30/2016 abdominal ultrasound. FINDINGS: Lower chest: Segmental ground-glass opacities in the right lower lobe along the major fissure probably represent areas of atelectasis. Hepatobiliary: No focal liver abnormality. Gallstones. No intra or extrahepatic biliary ductal dilatation. Pancreas: Unremarkable. No pancreatic ductal dilatation or surrounding inflammatory changes. Spleen: Normal in size without focal abnormality. Adrenals/Urinary Tract: Ectopic malrotated left kidney. Right kidney upper pole punctate nonobstructing stone. No hydronephrosis. Collapsed bladder. Stomach/Bowel: No obstructive or inflammatory changes of the bowel are identified. Status post colectomy with mild increase in dilatation of small bowel proximal to the rectum. Vascular/Lymphatic: Aortic atherosclerosis. No enlarged abdominal or pelvic lymph nodes. Reproductive: No abdominal wall hernia or abnormality. No abdominopelvic ascites. Other: Musculoskeletal: Interval progressive degenerative changes of the lumbar spine with  increasing lower lumbar facet arthropathy and new grade 1 L4-5 degenerative anterolisthesis. No acute osseous abnormality is identified. Transitional  L5 vertebral body with articulation of transverse processes with the sacral alae bilaterally. IMPRESSION: 1. Mild increase in dilatation of small bowel anastomosis proximal to the rectum of uncertain significance. No definite obstructive or inflammatory changes of bowel identified. 2. Right kidney upper pole nonobstructing stone. 3. Gallstones. 4. Ectopic malrotated left kidney, stable. 5. Aortic atherosclerosis. 6. Interval mild progression of lumbar spine degenerative changes. No acute osseous abnormality identified. Electronically Signed   By: Mitzi Hansen M.D.   On: 01/30/2016 03:47   Dg Chest 2 View  Result Date: 01/30/2016 CLINICAL DATA:  81 y/o F; nausea, vomiting, and loss of appetite for 3 days. EXAM: CHEST  2 VIEW COMPARISON:  01/20/2016 chest radiograph FINDINGS: Patient is rotated. The heart size and mediastinal contours are within normal limits and stable given projection and technique. Both lungs are clear. The visualized skeletal structures are unremarkable. Aortic atherosclerosis with calcification. IMPRESSION: No active cardiopulmonary disease.  Aortic atherosclerosis. Electronically Signed   By: Mitzi Hansen M.D.   On: 01/30/2016 01:38   US Abdomen Complete  Result Date: 01/30/2016 CLINICAL DATA:  Elevated LFTs, upper abdominal pain and nausea vomiting EXAM: ABDOMEN ULTRASOUND COMPLETE COMPARISON:  CT 06/11/2009 FINDINGS: Gallbladder: Echogenic stones are present in the gallbladder. A round non mobile soft tissue echogenicity measuring 8 mm is also visualized and may represent a polyp. Normal wall thickness. No sonographic Murphy's. Common bile duct: Diameter: Slightly enlarged measuring 9 mm in maximum diameter. There is a small echogenic stone in the proximal common bile duct. Liver: No focal lesion identified. Within  normal limits in parenchymal echogenicity. Mild intra hepatic biliary dilatation suggested. IVC: No abnormality visualized. Pancreas: Poorly evaluated due to bowel gas Spleen: Size and appearance within normal limits. Right Kidney: Length: 9.5 cm. Echogenicity within normal limits. No mass or hydronephrosis visualized. Left Kidney: Length: 6.5 cm. Located in the low left abdomen. Slightly dysmorphic. No hydronephrosis Abdominal aorta: Obscured by bowel gas Other findings: None. IMPRESSION: 1. Gallstones and probable polyp in the gallbladder. No sonographic evidence for acute cholecystitis 2. Slightly enlarged common bowel duct up to 9 mm with mild intra hepatic biliary dilatation. 5 mm echogenic stone present in the proximal common bowel duct. 3. Ectopic left kidney located in the low left abdomen. Kidney is somewhat small and dysmorphic in appearance. 4. Pancreas and aorta are poorly evaluated due to bowel gas Electronically Signed   By: Jasmine Pang M.D.   On: 01/30/2016 02:29     PREVIOUS ENDOSCOPIES:            See history of present illness

## 2016-01-30 NOTE — Progress Notes (Signed)
PROGRESS NOTE    Melrose NakayamaSarah M Karges  ONG:[id number]:[id number] DOB: 1935/12/12 DOA: 01/29/2016 PCP: Thane EduKOEHLER,ROBERT NICHOLAS, MD    Brief Narrative:   81 y.o. female with medical history significant for hypertension, chronic kidney disease stage III, history of CVA with residual dysarthria and hemiparesis, and dementia who presents to the emergency department with upper abdominal pain, nausea, vomiting, and loss of appetite of 3 days' duration.   Currently being evaluated for choledocholithiasis.  Assessment & Plan:   Principal Problem:   Choledocholithiasis - Currently this diagnosis is suspected but not confirmed. Game plan by GI is to obtain further imaging studies. - Disposition pending results  Active Problems:   Hypertension   History of CVA (cerebrovascular accident) - No neurological deficits reported   CKD (chronic kidney disease) stage 3, GFR 30-59 ml/min   Acute kidney injury (HCC) - Most likely secondary to prerenal etiology given history of poor oral intake. We'll continue IV fluid rehydration and reassess BMP next a.m.  DVT prophylaxis: Heparin Code Status: Full Family Communication: None at bedside Disposition Plan: Pending improvement in condition   Consultants:   Gastroenterology   Procedures: None  Antimicrobials: None  Subjective: Patient has no new complaints. No acute issues overnight  Objective: Vitals:   01/30/16 0445 01/30/16 0500 01/30/16 0627 01/30/16 1000  BP: 115/77 129/75 120/82 (!) 119/40  Pulse:   88 74  Resp: 11 16 16 14   Temp:   97.9 F (36.6 C) 98 F (36.7 C)  TempSrc:   Oral Oral  SpO2:   94% 98%  Weight:   61.4 kg (135 lb 5.8 oz)   Height:   5\' 6"  (1.676 m)     Intake/Output Summary (Last 24 hours) at 01/30/16 1459 Last data filed at 01/30/16 1001  Gross per 24 hour  Intake              805 ml  Output              400 ml  Net              405 ml   Filed Weights   01/30/16 0627  Weight: 61.4 kg (135 lb 5.8 oz)     Examination:  General exam: Appears calm and comfortable  Respiratory system: Clear to auscultation. Respiratory effort normal. Cardiovascular system: S1 & S2 heard, RRR. No JVD, murmurs, rubs, gallops or clicks. No pedal edema. Gastrointestinal system: Abdomen is nondistended, soft , No guarding Central nervous system: Alert and oriented. No focal neurological deficits. Extremities: Symmetric 5 x 5 power, no cyanosis Skin: No rashes, lesions or ulcers, on limited exam. Psychiatry:  Mood & affect appropriate.   Data Reviewed: I have personally reviewed following labs and imaging studies  CBC:  Recent Labs Lab 01/30/16 0037  WBC 5.9  NEUTROABS 4.5  HGB 12.3  HCT 37.8  MCV 84.9  PLT 265   Basic Metabolic Panel:  Recent Labs Lab 01/30/16 0037  NA 135  K 4.5  CL 104  CO2 18*  GLUCOSE 115*  BUN 52*  CREATININE 3.76*  CALCIUM 9.4   GFR: Estimated Creatinine Clearance: 11.5 mL/min (by C-G formula based on SCr of 3.76 mg/dL (H)). Liver Function Tests:  Recent Labs Lab 01/30/16 0037  AST 34  ALT 79*  ALKPHOS 105  BILITOT 0.6  PROT 7.3  ALBUMIN 3.8    Recent Labs Lab 01/30/16 0037  LIPASE 18   No results for input(s): AMMONIA in the last 168 hours. Coagulation Profile: No  results for input(s): INR, PROTIME in the last 168 hours. Cardiac Enzymes:  Recent Labs Lab 01/30/16 0037  TROPONINI <0.03   BNP (last 3 results) No results for input(s): PROBNP in the last 8760 hours. HbA1C: No results for input(s): HGBA1C in the last 72 hours. CBG:  Recent Labs Lab 01/30/16 0559 01/30/16 1157  GLUCAP 80 109*   Lipid Profile: No results for input(s): CHOL, HDL, LDLCALC, TRIG, CHOLHDL, LDLDIRECT in the last 72 hours. Thyroid Function Tests: No results for input(s): TSH, T4TOTAL, FREET4, T3FREE, THYROIDAB in the last 72 hours. Anemia Panel: No results for input(s): VITAMINB12, FOLATE, FERRITIN, TIBC, IRON, RETICCTPCT in the last 72 hours. Sepsis  Labs: No results for input(s): PROCALCITON, LATICACIDVEN in the last 168 hours.  No results found for this or any previous visit (from the past 240 hour(s)).       Radiology Studies: Ct Abdomen Pelvis Wo Contrast  Result Date: 01/30/2016 CLINICAL DATA:  81 y/o F; right lower quadrant abdominal pain. History colectomy, internal hemorrhoids, adenomatous colonic polyps, diverticulosis, diabetes, chronic kidney disease, and hysterectomy. EXAM: CT ABDOMEN AND PELVIS WITHOUT CONTRAST TECHNIQUE: Multidetector CT imaging of the abdomen and pelvis was performed following the standard protocol without IV contrast. COMPARISON:  06/11/2009 CT abdomen and pelvis. 01/30/2016 abdominal ultrasound. FINDINGS: Lower chest: Segmental ground-glass opacities in the right lower lobe along the major fissure probably represent areas of atelectasis. Hepatobiliary: No focal liver abnormality. Gallstones. No intra or extrahepatic biliary ductal dilatation. Pancreas: Unremarkable. No pancreatic ductal dilatation or surrounding inflammatory changes. Spleen: Normal in size without focal abnormality. Adrenals/Urinary Tract: Ectopic malrotated left kidney. Right kidney upper pole punctate nonobstructing stone. No hydronephrosis. Collapsed bladder. Stomach/Bowel: No obstructive or inflammatory changes of the bowel are identified. Status post colectomy with mild increase in dilatation of small bowel proximal to the rectum. Vascular/Lymphatic: Aortic atherosclerosis. No enlarged abdominal or pelvic lymph nodes. Reproductive: No abdominal wall hernia or abnormality. No abdominopelvic ascites. Other: Musculoskeletal: Interval progressive degenerative changes of the lumbar spine with increasing lower lumbar facet arthropathy and new grade 1 L4-5 degenerative anterolisthesis. No acute osseous abnormality is identified. Transitional L5 vertebral body with articulation of transverse processes with the sacral alae bilaterally. IMPRESSION: 1. Mild  increase in dilatation of small bowel anastomosis proximal to the rectum of uncertain significance. No definite obstructive or inflammatory changes of bowel identified. 2. Right kidney upper pole nonobstructing stone. 3. Gallstones. 4. Ectopic malrotated left kidney, stable. 5. Aortic atherosclerosis. 6. Interval mild progression of lumbar spine degenerative changes. No acute osseous abnormality identified. Electronically Signed   By: Mitzi Hansen M.D.   On: 01/30/2016 03:47   Dg Chest 2 View  Result Date: 01/30/2016 CLINICAL DATA:  81 y/o F; nausea, vomiting, and loss of appetite for 3 days. EXAM: CHEST  2 VIEW COMPARISON:  01/20/2016 chest radiograph FINDINGS: Patient is rotated. The heart size and mediastinal contours are within normal limits and stable given projection and technique. Both lungs are clear. The visualized skeletal structures are unremarkable. Aortic atherosclerosis with calcification. IMPRESSION: No active cardiopulmonary disease.  Aortic atherosclerosis. Electronically Signed   By: Mitzi Hansen M.D.   On: 01/30/2016 01:38   US Abdomen Complete  Result Date: 01/30/2016 CLINICAL DATA:  Elevated LFTs, upper abdominal pain and nausea vomiting EXAM: ABDOMEN ULTRASOUND COMPLETE COMPARISON:  CT 06/11/2009 FINDINGS: Gallbladder: Echogenic stones are present in the gallbladder. A round non mobile soft tissue echogenicity measuring 8 mm is also visualized and may represent a polyp. Normal wall thickness. No sonographic  Murphy's. Common bile duct: Diameter: Slightly enlarged measuring 9 mm in maximum diameter. There is a small echogenic stone in the proximal common bile duct. Liver: No focal lesion identified. Within normal limits in parenchymal echogenicity. Mild intra hepatic biliary dilatation suggested. IVC: No abnormality visualized. Pancreas: Poorly evaluated due to bowel gas Spleen: Size and appearance within normal limits. Right Kidney: Length: 9.5 cm. Echogenicity  within normal limits. No mass or hydronephrosis visualized. Left Kidney: Length: 6.5 cm. Located in the low left abdomen. Slightly dysmorphic. No hydronephrosis Abdominal aorta: Obscured by bowel gas Other findings: None. IMPRESSION: 1. Gallstones and probable polyp in the gallbladder. No sonographic evidence for acute cholecystitis 2. Slightly enlarged common bowel duct up to 9 mm with mild intra hepatic biliary dilatation. 5 mm echogenic stone present in the proximal common bowel duct. 3. Ectopic left kidney located in the low left abdomen. Kidney is somewhat small and dysmorphic in appearance. 4. Pancreas and aorta are poorly evaluated due to bowel gas Electronically Signed   By: Jasmine Pang M.D.   On: 01/30/2016 02:29        Scheduled Meds: . atorvastatin  40 mg Oral Daily  . cholecalciferol  1,000 Units Oral Daily  . heparin  5,000 Units Subcutaneous Q8H  . [START ON 01/31/2016] Influenza vac split quadrivalent PF  0.5 mL Intramuscular Tomorrow-1000  . sodium chloride flush  3 mL Intravenous Q12H   Continuous Infusions: . sodium chloride 100 mL/hr at 01/30/16 0657     LOS: 0 days    Time spent: > 35 minutes  Penny Pia, MD [facility] Pager [phone]  If 7PM-7AM, please contact night-coverage [email or web address] Password [email or web address] 01/30/2016, 2:59 PM

## 2016-01-30 NOTE — H&P (Signed)
History and Physical    Emma NakayamaSarah M Tyler WUJ:[id number] DOB: 1935/09/04 DOA: 01/29/2016  PCP: Thane EduKOEHLER,ROBERT NICHOLAS, MD   Patient coming from: Home  Chief Complaint: Abdominal pain, nausea, vomiting, loss of appetite  HPI: [name] is a 81 y.o. female with medical history significant for hypertension, chronic kidney disease stage III, history of CVA with residual dysarthria and hemiparesis, and dementia who presents to the emergency department with upper abdominal pain, nausea, vomiting, and loss of appetite of 3 days' duration. Patient was evaluated in the emergency department one week ago with very similar symptoms, but left prior to being evaluated by ED physician. His symptoms had resolved at that time and she felt well for several days before experiencing a recurrence in her symptoms approximately 3 days ago. She has had intermittent pain in the upper abdomen described as severe, sharp, nonradiating, waxing and waning; associated with nausea, vomiting, and loss of appetite. She denies any fevers or chills and denies chest pain or palpitations. She is unable to identify any alleviating or exacerbating factors for her symptoms and has not tried any interventions at home prior to coming in.  ED Course: Upon arrival to the ED, patient is found to be afebrile, saturating well on room air, and with vital signs stable. EKG features a sinus rhythm with early R transition. Chest x-ray is negative for acute cardiopulmonary disease. Chemistry panels notable for a BUN of 52 and serum creatinine of 3.76, up from an apparent baseline of 1.9. Chemistries were also notable for an ALT of 79, improved from 1 week ago when ALT was 159 and AST was 394. CBC is unremarkable and troponin is undetectable. Urinalysis is notable for an increased specific gravity. Abdominal ultrasound was obtained and notable for cholelithiasis without acute cholecystitis, as well as slight increased dilation of common bile duct to 9  mm with a 5 mm stone within the proximal CBD. CT of the abdomen and pelvis was obtained and notable for a mild increase in dilation of the small bowel anastomosis with no definite obstruction or inflammation. Patient was treated with a liter of normal saline in the emergency department and gastroenterology was consulted by the ED physician. GI has advised a medical admission for likely ERCP in the morning. Patient became briefly tachycardic in the ED with a sinus tachycardia captured on EKG. She was asymptomatic with this and it has resolved with a low dose Lopressor IVP. She will be admitted to the telemetry unit for ongoing evaluation and management of abdominal pain with nausea and vomiting suspected secondary to choledocholithiasis.   Review of Systems:  All other systems reviewed and apart from HPI, are negative.  Past Medical History:  Diagnosis Date  . Arthritis    hands  . Asthma   . Chronic kidney disease (CKD), stage III (moderate)   . Dementia   . Depression   . Diabetes mellitus without complication (HCC)   . Diverticulosis   . Hx of adenomatous colonic polyps   . Hyperlipidemia   . Hypertension   . Hypochromic anemia   . Internal hemorrhoids   . Stroke [facility](HCC) May 2009   multiple, last one Nov 2012, 2 strokes with right sided deficits and slurred speech    Past Surgical History:  Procedure Laterality Date  . ABDOMINAL HYSTERECTOMY    . ABDOMINAL SURGERY    . COLECTOMY    . FLEXIBLE SIGMOIDOSCOPY N/A 05/28/2012   Procedure: FLEXIBLE SIGMOIDOSCOPY;  Surgeon: [name], MD;  [facility];  Service: Endoscopy;  Laterality: N/A;  . OTHER SURGICAL HISTORY     2 ear surgeries  . PERIPHERAL VASCULAR CATHETERIZATION N/A 07/21/2014   Procedure: Abdominal Aortogram w/Lower Extremity;  Surgeon: Nada Libman, MD;  Location: [facility] INVASIVE CV LAB;  Service: Cardiovascular;  Laterality: N/A;  . PERIPHERAL VASCULAR CATHETERIZATION N/A 11/23/2015   Procedure: Abdominal  Aortogram w/Lower Extremity;  Surgeon: Nada Libman, MD;  Location: [facility] INVASIVE CV LAB;  Service: Cardiovascular;  Laterality: N/A;  . PERIPHERAL VASCULAR CATHETERIZATION  11/23/2015   Procedure: Peripheral Vascular Intervention;  Surgeon: Nada Libman, MD;  Location: [facility] INVASIVE CV LAB;  Service: Cardiovascular;;  Failed PVI of AT and Peroneal on left side  . SUBTOTAL COLECTOMY  2010   for stricturing from bouts of diverticulitis along with hx recurrent diverticular bleeds.  the surgery was a total abdominal colec  . TONSILLECTOMY    . VASCULAR SURGERY       reports that she has quit smoking. She has never used smokeless tobacco. She reports that she does not drink alcohol or use drugs.  Allergies  Allergen Reactions  . Aspirin Nausea And Vomiting    Hurts stomach    Family History  Problem Relation Age of Onset  . Diabetes Paternal Aunt   . Stroke Father   . Heart disease Father   . Diabetes Father   . Depression Father   . HIV Brother   . Prostate cancer    . Cancer Sister     unknown type  . Colon cancer Neg Hx      Prior to Admission medications   Medication Sig Start Date End Date Taking? Authorizing Provider  acetaminophen (TYLENOL) 325 MG tablet Take 650 mg by mouth every 6 (six) hours as needed (for pain/fever).     Historical Provider, MD  atorvastatin (LIPITOR) 40 MG tablet Take 40 mg by mouth daily.    Historical Provider, MD  Cholecalciferol (VITAMIN D3) 1000 UNITS CAPS Take 1,000 Units by mouth daily.     Historical Provider, MD  clopidogrel (PLAVIX) 75 MG tablet Take 75 mg by mouth daily.    Historical Provider, MD  hydrocortisone (ANUSOL-HC) 25 MG suppository Place 25 mg rectally 2 (two) times daily as needed for hemorrhoids.     Historical Provider, MD  loratadine (CLARITIN) 10 MG tablet Take 10 mg by mouth daily as needed for allergies.     Historical Provider, MD  losartan-hydrochlorothiazide (HYZAAR) 50-12.5 MG tablet Take 1 tablet by mouth daily.     Historical Provider, MD  sennosides-docusate sodium (SENOKOT-S) 8.6-50 MG tablet Take 1 tablet by mouth daily as needed for constipation.     Historical Provider, MD    Physical Exam: Vitals:   01/30/16 0300 01/30/16 0400 01/30/16 0445 01/30/16 0500  BP: 150/59 119/81 115/77 129/75  Pulse:      Resp: 16 15 11 16   Temp:      SpO2:          Constitutional: NAD, calm, comfortable Eyes: PERTLA, lids and conjunctivae normal ENMT: Mucous membranes are moist. Posterior pharynx clear of any exudate or lesions.   Neck: normal, supple, no masses, no thyromegaly Respiratory: clear to auscultation bilaterally, no wheezing, no crackles. Normal respiratory effort.   Cardiovascular: S1 & S2 heard, regular rate and rhythm, soft systolic murmur at apex. No extremity edema. No significant JVD. Abdomen: No distension, tender in epigastrium and RUQ without rebound pain or guarding. No masses palpated. Bowel sounds normal.  Musculoskeletal:  no clubbing / cyanosis. No joint deformity upper and lower extremities.   Skin: no significant rashes, lesions, ulcers. Warm, dry, well-perfused. Neurologic: No gross facial asymmetry. PERRL, EOMI. Dysarthric.   Psychiatric: Normal judgment and insight. Alert and oriented x 3. Normal mood and affect.     Labs on Admission: I have personally reviewed following labs and imaging studies  CBC:  Recent Labs Lab 01/30/16 0037  WBC 5.9  NEUTROABS 4.5  HGB 12.3  HCT 37.8  MCV 84.9  PLT 265   Basic Metabolic Panel:  Recent Labs Lab 01/30/16 0037  NA 135  K 4.5  CL 104  CO2 18*  GLUCOSE 115*  BUN 52*  CREATININE 3.76*  CALCIUM 9.4   GFR: Estimated Creatinine Clearance: 11.1 mL/min (by C-G formula based on SCr of 3.76 mg/dL (H)). Liver Function Tests:  Recent Labs Lab 01/30/16 0037  AST 34  ALT 79*  ALKPHOS 105  BILITOT 0.6  PROT 7.3  ALBUMIN 3.8    Recent Labs Lab 01/30/16 0037  LIPASE 18   No results for input(s): AMMONIA in the  last 168 hours. Coagulation Profile: No results for input(s): INR, PROTIME in the last 168 hours. Cardiac Enzymes:  Recent Labs Lab 01/30/16 0037  TROPONINI <0.03   BNP (last 3 results) No results for input(s): PROBNP in the last 8760 hours. HbA1C: No results for input(s): HGBA1C in the last 72 hours. CBG: No results for input(s): GLUCAP in the last 168 hours. Lipid Profile: No results for input(s): CHOL, HDL, LDLCALC, TRIG, CHOLHDL, LDLDIRECT in the last 72 hours. Thyroid Function Tests: No results for input(s): TSH, T4TOTAL, FREET4, T3FREE, THYROIDAB in the last 72 hours. Anemia Panel: No results for input(s): VITAMINB12, FOLATE, FERRITIN, TIBC, IRON, RETICCTPCT in the last 72 hours. Urine analysis:    Component Value Date/Time   COLORURINE YELLOW 01/30/2016 0115   APPEARANCEUR CLEAR 01/30/2016 0115   LABSPEC >1.030 (H) 01/30/2016 0115   PHURINE 5.0 01/30/2016 0115   GLUCOSEU NEGATIVE 01/30/2016 0115   HGBUR TRACE (A) 01/30/2016 0115   BILIRUBINUR NEGATIVE 01/30/2016 0115   KETONESUR NEGATIVE 01/30/2016 0115   PROTEINUR NEGATIVE 01/30/2016 0115   UROBILINOGEN 0.2 05/27/2012 1416   NITRITE NEGATIVE 01/30/2016 0115   LEUKOCYTESUR NEGATIVE 01/30/2016 0115   Sepsis Labs: @LABRCNTIP (procalcitonin:4,lacticidven:4) )No results found for this or any previous visit (from the past 240 hour(s)).   Radiological Exams on Admission: Ct Abdomen Pelvis Wo Contrast  Result Date: 01/30/2016 CLINICAL DATA:  80 y/o F; right lower quadrant abdominal pain. History colectomy, internal hemorrhoids, adenomatous colonic polyps, diverticulosis, diabetes, chronic kidney disease, and hysterectomy. EXAM: CT ABDOMEN AND PELVIS WITHOUT CONTRAST TECHNIQUE: Multidetector CT imaging of the abdomen and pelvis was performed following the standard protocol without IV contrast. COMPARISON:  06/11/2009 CT abdomen and pelvis. 01/30/2016 abdominal ultrasound. FINDINGS: Lower chest: Segmental ground-glass  opacities in the right lower lobe along the major fissure probably represent areas of atelectasis. Hepatobiliary: No focal liver abnormality. Gallstones. No intra or extrahepatic biliary ductal dilatation. Pancreas: Unremarkable. No pancreatic ductal dilatation or surrounding inflammatory changes. Spleen: Normal in size without focal abnormality. Adrenals/Urinary Tract: Ectopic malrotated left kidney. Right kidney upper pole punctate nonobstructing stone. No hydronephrosis. Collapsed bladder. Stomach/Bowel: No obstructive or inflammatory changes of the bowel are identified. Status post colectomy with mild increase in dilatation of small bowel proximal to the rectum. Vascular/Lymphatic: Aortic atherosclerosis. No enlarged abdominal or pelvic lymph nodes. Reproductive: No abdominal wall hernia or abnormality. No abdominopelvic ascites. Other: Musculoskeletal: Interval progressive degenerative  changes of the lumbar spine with increasing lower lumbar facet arthropathy and new grade 1 L4-5 degenerative anterolisthesis. No acute osseous abnormality is identified. Transitional L5 vertebral body with articulation of transverse processes with the sacral alae bilaterally. IMPRESSION: 1. Mild increase in dilatation of small bowel anastomosis proximal to the rectum of uncertain significance. No definite obstructive or inflammatory changes of bowel identified. 2. Right kidney upper pole nonobstructing stone. 3. Gallstones. 4. Ectopic malrotated left kidney, stable. 5. Aortic atherosclerosis. 6. Interval mild progression of lumbar spine degenerative changes. No acute osseous abnormality identified. Electronically Signed   By: Mitzi Hansen M.D.   On: 01/30/2016 03:47   Dg Chest 2 View  Result Date: 01/30/2016 CLINICAL DATA:  81 y/o F; nausea, vomiting, and loss of appetite for 3 days. EXAM: CHEST  2 VIEW COMPARISON:  01/20/2016 chest radiograph FINDINGS: Patient is rotated. The heart size and mediastinal contours  are within normal limits and stable given projection and technique. Both lungs are clear. The visualized skeletal structures are unremarkable. Aortic atherosclerosis with calcification. IMPRESSION: No active cardiopulmonary disease.  Aortic atherosclerosis. Electronically Signed   By: Mitzi Hansen M.D.   On: 01/30/2016 01:38   US Abdomen Complete  Result Date: 01/30/2016 CLINICAL DATA:  Elevated LFTs, upper abdominal pain and nausea vomiting EXAM: ABDOMEN ULTRASOUND COMPLETE COMPARISON:  CT 06/11/2009 FINDINGS: Gallbladder: Echogenic stones are present in the gallbladder. A round non mobile soft tissue echogenicity measuring 8 mm is also visualized and may represent a polyp. Normal wall thickness. No sonographic Murphy's. Common bile duct: Diameter: Slightly enlarged measuring 9 mm in maximum diameter. There is a small echogenic stone in the proximal common bile duct. Liver: No focal lesion identified. Within normal limits in parenchymal echogenicity. Mild intra hepatic biliary dilatation suggested. IVC: No abnormality visualized. Pancreas: Poorly evaluated due to bowel gas Spleen: Size and appearance within normal limits. Right Kidney: Length: 9.5 cm. Echogenicity within normal limits. No mass or hydronephrosis visualized. Left Kidney: Length: 6.5 cm. Located in the low left abdomen. Slightly dysmorphic. No hydronephrosis Abdominal aorta: Obscured by bowel gas Other findings: None. IMPRESSION: 1. Gallstones and probable polyp in the gallbladder. No sonographic evidence for acute cholecystitis 2. Slightly enlarged common bowel duct up to 9 mm with mild intra hepatic biliary dilatation. 5 mm echogenic stone present in the proximal common bowel duct. 3. Ectopic left kidney located in the low left abdomen. Kidney is somewhat small and dysmorphic in appearance. 4. Pancreas and aorta are poorly evaluated due to bowel gas Electronically Signed   By: Jasmine Pang M.D.   On: 01/30/2016 02:29    EKG:  Independently reviewed. Sinus rhythm, early R-transition  Assessment/Plan  1. Choledocholithiasis with N/V - Pt presents with recurrent bout of upper abd pain with nausea and vomiting - Abd US reveals a 5 mm stone in proximal CBD with mild intrahepatic biliary dilation and CBD 9 mm  - There is no sonographic, clinical, or laboratory evidence for acute infection  - GI is consulting and much appreciated; tentative plans for ERCP  - Plan to keep NPO, hold Plavix, provide IV hydration, prn anti-emetics and analgesia, monitor lytes, follow-up GI recommendations    2. Acute kidney injury superimposed on CKD stage III  - SCr is 3.76 on admission, up from apparent baseline of ~1.90  - Likely a prerenal azotemia in setting of loss of appetite and vomiting - No obstructive uropathy on imaging from ED   - She was given 1 liter NS in ED  and will be continued on a gentle IVF hydration  - Hold losartan and HCTZ, renally-dose medications as needed   3. Hx of CVA  - No new deficits on admission  - Hold Plavix in preparation for likely endoscopy  - Continue Lipitor    4. Hypertension  - At goal in ED  - Managed at home with losartan and HCTZ; these are held on admission in light of AKI on CKD III    DVT prophylaxis: sq heparin  Code Status: Full  Family Communication: Discussed with patient Disposition Plan: Admit to telemetry Consults called: Gastroenterology Admission status: Inpatient    Briscoe Deutscher, MD [facility] Pager [phone]  If 7PM-7AM, please contact night-coverage [email or web address] Password [email or web address]  01/30/2016, 5:11 AM

## 2016-01-31 DIAGNOSIS — K862 Cyst of pancreas: Secondary | ICD-10-CM

## 2016-01-31 DIAGNOSIS — K8020 Calculus of gallbladder without cholecystitis without obstruction: Secondary | ICD-10-CM

## 2016-01-31 DIAGNOSIS — R748 Abnormal levels of other serum enzymes: Secondary | ICD-10-CM

## 2016-01-31 DIAGNOSIS — R1031 Right lower quadrant pain: Secondary | ICD-10-CM

## 2016-01-31 DIAGNOSIS — R933 Abnormal findings on diagnostic imaging of other parts of digestive tract: Secondary | ICD-10-CM

## 2016-01-31 LAB — BASIC METABOLIC PANEL
ANION GAP: 7 (ref 5–15)
BUN: 40 mg/dL — ABNORMAL HIGH (ref 6–20)
CHLORIDE: 110 mmol/L (ref 101–111)
CO2: 18 mmol/L — ABNORMAL LOW (ref 22–32)
CREATININE: 2.08 mg/dL — AB (ref 0.44–1.00)
Calcium: 8.1 mg/dL — ABNORMAL LOW (ref 8.9–10.3)
GFR calc non Af Amer: 22 mL/min — ABNORMAL LOW (ref >60)
GFR, EST AFRICAN AMERICAN: 25 mL/min — AB (ref >60)
Glucose, Bld: 100 mg/dL — ABNORMAL HIGH (ref 65–99)
Potassium: 3.5 mmol/L (ref 3.5–5.1)
SODIUM: 135 mmol/L (ref 135–145)

## 2016-01-31 LAB — HEPATITIS PANEL, ACUTE
HCV Ab: <0.1 s/co ratio (ref 0.0–0.9)
HEP B S AG: NEGATIVE
Hep A IgM: NEGATIVE
Hep B C IgM: NEGATIVE

## 2016-01-31 LAB — GLUCOSE, CAPILLARY
Glucose-Capillary: 96 mg/dL (ref 65–99)
Glucose-Capillary: 107 mg/dL — ABNORMAL HIGH (ref 65–99)
Glucose-Capillary: 104 mg/dL — ABNORMAL HIGH (ref 65–99)

## 2016-01-31 MED ORDER — HEPARIN SODIUM (PORCINE) 5000 UNIT/ML IJ SOLN
5000.0000 [IU] | Freq: Three times a day (TID) | INTRAMUSCULAR | Status: AC
Start: 2016-02-01 — End: 2016-02-02
  Administered 2016-02-01 – 2016-02-02 (×5): 5000 [IU] via SUBCUTANEOUS
  Filled 2016-01-31 (×4): qty 1

## 2016-01-31 MED ORDER — PANTOPRAZOLE SODIUM 40 MG PO TBEC
40.0000 mg | DELAYED_RELEASE_TABLET | Freq: Two times a day (BID) | ORAL | Status: DC
  Administered 2016-01-31 – 2016-02-03 (×7): 40 mg via ORAL
  Filled 2016-01-31 (×7): qty 1

## 2016-01-31 NOTE — Progress Notes (Signed)
PROGRESS NOTE    [name]  ZOX:[id number] DOB: 1935-09-30 DOA: 01/29/2016 PCP: [name], MD    Brief Narrative:   81 y.o. female with medical history significant for hypertension, chronic kidney disease stage III, history of CVA with residual dysarthria and hemiparesis, and dementia who presents to the emergency department with upper abdominal pain, nausea, vomiting, and loss of appetite of 3 days' duration.   Currently being evaluated for choledocholithiasis.  Assessment & Plan:   Principal Problem:   Choledocholithiasis - GI on board and managing. Started pt on protonix, upper endoscopy for 02/02/16  Active Problems:   Hypertension    History of CVA (cerebrovascular accident) - No neurological deficits reported    CKD (chronic kidney disease) stage 3, GFR 30-59 ml/min   Acute kidney injury (HCC) - Most likely secondary to prerenal etiology given history of poor oral intake.  - improving with IVF rehydration and improvement in oral intake.  DVT prophylaxis: Heparin Code Status: Full Family Communication: None at bedside Disposition Plan: Pending improvement in condition   Consultants:   Gastroenterology   Procedures: None  Antimicrobials: None  Subjective: Patient has no new complaints. No acute issues overnight  Objective: Vitals:   01/30/16 1000 01/30/16 2122 01/31/16 0604 01/31/16 0927  BP: (!) 119/40 (!) 128/48 (!) 130/59 118/75  Pulse: 74 72 73 78  Resp: 14 15 14 16   Temp: 98 F (36.7 C) 98.1 F (36.7 C) 98.4 F (36.9 C) 97.6 F (36.4 C)  TempSrc: Oral Oral Oral Oral  SpO2: 98% 100% 100% 100%  Weight:  61.3 kg (135 lb 2.3 oz)    Height:        Intake/Output Summary (Last 24 hours) at 01/31/16 1451 Last data filed at 01/31/16 1035  Gross per 24 hour  Intake           876.67 ml  Output              300 ml  Net           576.67 ml   Filed Weights   01/30/16 0627 01/30/16 2122  Weight: 61.4 kg (135 lb 5.8 oz) 61.3 kg (135  lb 2.3 oz)    Examination:  General exam: Appears calm and comfortable  Respiratory system: Clear to auscultation. Respiratory effort normal. Cardiovascular system: S1 & S2 heard, RRR. No JVD, murmurs, rubs, gallops or clicks. No pedal edema. Gastrointestinal system: Abdomen is nondistended, soft , No guarding Central nervous system: Alert and oriented. No focal neurological deficits. Extremities: Symmetric 5 x 5 power, no cyanosis Skin: No rashes, lesions or ulcers, on limited exam. Psychiatry:  Mood & affect appropriate.   Data Reviewed: I have personally reviewed following labs and imaging studies  CBC:  Recent Labs Lab 01/30/16 0037  WBC 5.9  NEUTROABS 4.5  HGB 12.3  HCT 37.8  MCV 84.9  PLT 265   Basic Metabolic Panel:  Recent Labs Lab 01/30/16 0037 01/31/16 0355  NA 135 135  K 4.5 3.5  CL 104 110  CO2 18* 18*  GLUCOSE 115* 100*  BUN 52* 40*  CREATININE 3.76* 2.08*  CALCIUM 9.4 8.1*   GFR: Estimated Creatinine Clearance: 20.9 mL/min (by C-G formula based on SCr of 2.08 mg/dL (H)). Liver Function Tests:  Recent Labs Lab 01/30/16 0037  AST 34  ALT 79*  ALKPHOS 105  BILITOT 0.6  PROT 7.3  ALBUMIN 3.8    Recent Labs Lab 01/30/16 0037  LIPASE 18   No results  for input(s): AMMONIA in the last 168 hours. Coagulation Profile: No results for input(s): INR, PROTIME in the last 168 hours. Cardiac Enzymes:  Recent Labs Lab 01/30/16 0037  TROPONINI <0.03   BNP (last 3 results) No results for input(s): PROBNP in the last 8760 hours. HbA1C: No results for input(s): HGBA1C in the last 72 hours. CBG:  Recent Labs Lab 01/30/16 1157 01/30/16 1702 01/30/16 2358 01/31/16 0554 01/31/16 1240  GLUCAP 109* 100* 102* 96 107*   Lipid Profile: No results for input(s): CHOL, HDL, LDLCALC, TRIG, CHOLHDL, LDLDIRECT in the last 72 hours. Thyroid Function Tests: No results for input(s): TSH, T4TOTAL, FREET4, T3FREE, THYROIDAB in the last 72 hours. Anemia  Panel: No results for input(s): VITAMINB12, FOLATE, FERRITIN, TIBC, IRON, RETICCTPCT in the last 72 hours. Sepsis Labs: No results for input(s): PROCALCITON, LATICACIDVEN in the last 168 hours.  No results found for this or any previous visit (from the past 240 hour(s)).       Radiology Studies: Ct Abdomen Pelvis Wo Contrast  Result Date: 01/30/2016 CLINICAL DATA:  81 y/o F; right lower quadrant abdominal pain. History colectomy, internal hemorrhoids, adenomatous colonic polyps, diverticulosis, diabetes, chronic kidney disease, and hysterectomy. EXAM: CT ABDOMEN AND PELVIS WITHOUT CONTRAST TECHNIQUE: Multidetector CT imaging of the abdomen and pelvis was performed following the standard protocol without IV contrast. COMPARISON:  06/11/2009 CT abdomen and pelvis. 01/30/2016 abdominal ultrasound. FINDINGS: Lower chest: Segmental ground-glass opacities in the right lower lobe along the major fissure probably represent areas of atelectasis. Hepatobiliary: No focal liver abnormality. Gallstones. No intra or extrahepatic biliary ductal dilatation. Pancreas: Unremarkable. No pancreatic ductal dilatation or surrounding inflammatory changes. Spleen: Normal in size without focal abnormality. Adrenals/Urinary Tract: Ectopic malrotated left kidney. Right kidney upper pole punctate nonobstructing stone. No hydronephrosis. Collapsed bladder. Stomach/Bowel: No obstructive or inflammatory changes of the bowel are identified. Status post colectomy with mild increase in dilatation of small bowel proximal to the rectum. Vascular/Lymphatic: Aortic atherosclerosis. No enlarged abdominal or pelvic lymph nodes. Reproductive: No abdominal wall hernia or abnormality. No abdominopelvic ascites. Other: Musculoskeletal: Interval progressive degenerative changes of the lumbar spine with increasing lower lumbar facet arthropathy and new grade 1 L4-5 degenerative anterolisthesis. No acute osseous abnormality is identified.  Transitional L5 vertebral body with articulation of transverse processes with the sacral alae bilaterally. IMPRESSION: 1. Mild increase in dilatation of small bowel anastomosis proximal to the rectum of uncertain significance. No definite obstructive or inflammatory changes of bowel identified. 2. Right kidney upper pole nonobstructing stone. 3. Gallstones. 4. Ectopic malrotated left kidney, stable. 5. Aortic atherosclerosis. 6. Interval mild progression of lumbar spine degenerative changes. No acute osseous abnormality identified. Electronically Signed   By: Mitzi Hansen M.D.   On: 01/30/2016 03:47   Dg Chest 2 View  Result Date: 01/30/2016 CLINICAL DATA:  81 y/o F; nausea, vomiting, and loss of appetite for 3 days. EXAM: CHEST  2 VIEW COMPARISON:  01/20/2016 chest radiograph FINDINGS: Patient is rotated. The heart size and mediastinal contours are within normal limits and stable given projection and technique. Both lungs are clear. The visualized skeletal structures are unremarkable. Aortic atherosclerosis with calcification. IMPRESSION: No active cardiopulmonary disease.  Aortic atherosclerosis. Electronically Signed   By: Mitzi Hansen M.D.   On: 01/30/2016 01:38   US Abdomen Complete  Result Date: 01/30/2016 CLINICAL DATA:  Elevated LFTs, upper abdominal pain and nausea vomiting EXAM: ABDOMEN ULTRASOUND COMPLETE COMPARISON:  CT 06/11/2009 FINDINGS: Gallbladder: Echogenic stones are present in the gallbladder. A round non  mobile soft tissue echogenicity measuring 8 mm is also visualized and may represent a polyp. Normal wall thickness. No sonographic Murphy's. Common bile duct: Diameter: Slightly enlarged measuring 9 mm in maximum diameter. There is a small echogenic stone in the proximal common bile duct. Liver: No focal lesion identified. Within normal limits in parenchymal echogenicity. Mild intra hepatic biliary dilatation suggested. IVC: No abnormality visualized. Pancreas:  Poorly evaluated due to bowel gas Spleen: Size and appearance within normal limits. Right Kidney: Length: 9.5 cm. Echogenicity within normal limits. No mass or hydronephrosis visualized. Left Kidney: Length: 6.5 cm. Located in the low left abdomen. Slightly dysmorphic. No hydronephrosis Abdominal aorta: Obscured by bowel gas Other findings: None. IMPRESSION: 1. Gallstones and probable polyp in the gallbladder. No sonographic evidence for acute cholecystitis 2. Slightly enlarged common bowel duct up to 9 mm with mild intra hepatic biliary dilatation. 5 mm echogenic stone present in the proximal common bowel duct. 3. Ectopic left kidney located in the low left abdomen. Kidney is somewhat small and dysmorphic in appearance. 4. Pancreas and aorta are poorly evaluated due to bowel gas Electronically Signed   By: Jasmine PangKim  Fujinaga M.D.   On: 01/30/2016 02:29   Mr Abdomen Mrcp Wo Contrast  Result Date: 01/31/2016 CLINICAL DATA:  81 year old with upper abdominal pain, nausea, vomiting and elevated liver function studies. Cholelithiasis and mild biliary dilatation on ultrasound. EXAM: MRI ABDOMEN WITHOUT CONTRAST  (INCLUDING MRCP) TECHNIQUE: Multiplanar multisequence MR imaging of the abdomen was performed. Heavily T2-weighted images of the biliary and pancreatic ducts were obtained, and three-dimensional MRCP images were rendered by post processing. COMPARISON:  Ultrasound and abdominal CT 01/30/2016. FINDINGS: Despite efforts by the technologist and patient, motion artifact is present on today's exam and could not be eliminated. This reduces exam sensitivity and specificity. Lower chest: The visualized lower chest appears unremarkable. No significant pleural effusion. Hepatobiliary: The liver appears unremarkable. Small gallstones are present. There is no gallbladder wall thickening. There is biliary dilatation. The common hepatic duct measures up to 13 mm in diameter. The common bile duct tapers distally, measuring 6 mm in  diameter. No definite choledocholithiasis. Pancreas: The pancreas is atrophied. There are several cystic lesions within the pancreatic head, demonstrating no definite communication with the main pancreatic duct. These measure up to 13 x 8 mm in diameter. There is no pancreatic ductal dilatation. Spleen: Normal in size without focal abnormality. Adrenals/Urinary Tract: Both adrenal glands appear normal. The right kidney appears unremarkable. The left kidney is located in the false left pelvis and is malrotated with mild cortical thinning. Appearance is similar to the recent CT. Stomach/Bowel: There is proximal duodenal wall thickening with surrounding fluid/edema. Duodenal diverticulum noted. As noted on recent CT, there is mild small bowel dilatation in the false pelvis status post total colectomy. This is incompletely visualized. Vascular/Lymphatic: There are no enlarged abdominal lymph nodes. Aortic and branch vessel atherosclerosis, better seen on CT. Other: As above, there is some retroperitoneal edema/ fluid around the duodenum in the right upper quadrant. No generalized ascites. Musculoskeletal: No acute or significant osseous findings. Lumbar spondylosis noted. IMPRESSION: 1. Extrahepatic biliary dilatation without definite signs of choledocholithiasis. 2. Cholelithiasis without gallbladder wall thickening. 3. Wall thickening of the proximal duodenum with surrounding edema/fluid. This suggests duodenitis or peptic ulcer disease. Correlate clinically. 4. Atrophied pancreas with multiple cystic lesions in the pancreatic head, demonstrating no aggressive features on noncontrast imaging. Follow-up MRI at 2 years recommended (preferably with and without contrast if possible). This recommendation follows ACR  consensus guidelines: Management of Incidental Pancreatic Cysts: A White Paper of the ACR Incidental Findings Committee. J Am Coll Radiol 2017;14:911-923. Electronically Signed   By: Carey Bullocks M.D.   On:  01/31/2016 08:42   Mr 3d Recon At Scanner  Result Date: 01/31/2016 CLINICAL DATA:  81 year old with upper abdominal pain, nausea, vomiting and elevated liver function studies. Cholelithiasis and mild biliary dilatation on ultrasound. EXAM: MRI ABDOMEN WITHOUT CONTRAST  (INCLUDING MRCP) TECHNIQUE: Multiplanar multisequence MR imaging of the abdomen was performed. Heavily T2-weighted images of the biliary and pancreatic ducts were obtained, and three-dimensional MRCP images were rendered by post processing. COMPARISON:  Ultrasound and abdominal CT 01/30/2016. FINDINGS: Despite efforts by the technologist and patient, motion artifact is present on today's exam and could not be eliminated. This reduces exam sensitivity and specificity. Lower chest: The visualized lower chest appears unremarkable. No significant pleural effusion. Hepatobiliary: The liver appears unremarkable. Small gallstones are present. There is no gallbladder wall thickening. There is biliary dilatation. The common hepatic duct measures up to 13 mm in diameter. The common bile duct tapers distally, measuring 6 mm in diameter. No definite choledocholithiasis. Pancreas: The pancreas is atrophied. There are several cystic lesions within the pancreatic head, demonstrating no definite communication with the main pancreatic duct. These measure up to 13 x 8 mm in diameter. There is no pancreatic ductal dilatation. Spleen: Normal in size without focal abnormality. Adrenals/Urinary Tract: Both adrenal glands appear normal. The right kidney appears unremarkable. The left kidney is located in the false left pelvis and is malrotated with mild cortical thinning. Appearance is similar to the recent CT. Stomach/Bowel: There is proximal duodenal wall thickening with surrounding fluid/edema. Duodenal diverticulum noted. As noted on recent CT, there is mild small bowel dilatation in the false pelvis status post total colectomy. This is incompletely visualized.  Vascular/Lymphatic: There are no enlarged abdominal lymph nodes. Aortic and branch vessel atherosclerosis, better seen on CT. Other: As above, there is some retroperitoneal edema/ fluid around the duodenum in the right upper quadrant. No generalized ascites. Musculoskeletal: No acute or significant osseous findings. Lumbar spondylosis noted. IMPRESSION: 1. Extrahepatic biliary dilatation without definite signs of choledocholithiasis. 2. Cholelithiasis without gallbladder wall thickening. 3. Wall thickening of the proximal duodenum with surrounding edema/fluid. This suggests duodenitis or peptic ulcer disease. Correlate clinically. 4. Atrophied pancreas with multiple cystic lesions in the pancreatic head, demonstrating no aggressive features on noncontrast imaging. Follow-up MRI at 2 years recommended (preferably with and without contrast if possible). This recommendation follows ACR consensus guidelines: Management of Incidental Pancreatic Cysts: A White Paper of the ACR Incidental Findings Committee. J Am Coll Radiol 2017;14:911-923. Electronically Signed   By: Carey Bullocks M.D.   On: 01/31/2016 08:42    Scheduled Meds: . atorvastatin  40 mg Oral Daily  . cholecalciferol  1,000 Units Oral Daily  . heparin  5,000 Units Subcutaneous Q8H  . Influenza vac split quadrivalent PF  0.5 mL Intramuscular Tomorrow-1000  . pantoprazole  40 mg Oral BID  . sodium chloride flush  3 mL Intravenous Q12H   Continuous Infusions:    LOS: 1 day    Time spent: > 35 minutes  Penny Pia, MD [facility] Pager [phone]  If 7PM-7AM, please contact night-coverage [email or web address] Password [email or web address] 01/31/2016, 2:51 PM

## 2016-01-31 NOTE — Progress Notes (Signed)
Daily Rounding Note  01/31/2016, 9:33 AM  LOS: 1 day   SUBJECTIVE:   Chief complaint: right abdominal pain.  This is improved but not eradicated.  No N/V at all.    Overall feeling better.   OBJECTIVE:         Vital signs in last 24 hours:    Temp:  [97.6 F (36.4 C)-98.4 F (36.9 C)] 97.6 F (36.4 C) (01/08 0927) Pulse Rate:  [72-78] 78 (01/08 0927) Resp:  [14-16] 16 (01/08 0927) BP: (118-130)/(40-75) 118/75 (01/08 0927) SpO2:  [98 %-100 %] 100 % (01/08 0927) Weight:  [61.3 kg (135 lb 2.3 oz)] 61.3 kg (135 lb 2.3 oz) (01/07 2122) Last BM Date: 01/30/16 Filed Weights   01/30/16 0627 01/30/16 2122  Weight: 61.4 kg (135 lb 5.8 oz) 61.3 kg (135 lb 2.3 oz)   General: pleasant, frail.  Not acutely ill.  comfortable   Heart: RRR Chest: clear bil.  No dyspnea or cough Abdomen: soft, ND.  BS hypoactive but of normal character.  Minor tenderness in right mid and RUQ.  no guard or rebound  Extremities: no CCE Neuro/Psych:  Alert, appropriate, calm.  Expressive aphasia and dysarthria.    Intake/Output from previous day: 01/07 0701 - 01/08 0700 In: 1221.7 [P.O.:360; I.V.:861.7] Out: 300 [Urine:300]  Intake/Output this shift: Total I/O In: 360 [P.O.:360] Out: 0   Lab Results:  Recent Labs  01/30/16 0037  WBC 5.9  HGB 12.3  HCT 37.8  PLT 265   BMET  Recent Labs  01/30/16 0037 01/31/16 0355  NA 135 135  K 4.5 3.5  CL 104 110  CO2 18* 18*  GLUCOSE 115* 100*  BUN 52* 40*  CREATININE 3.76* 2.08*  CALCIUM 9.4 8.1*   LFT  Recent Labs  01/30/16 0037  PROT 7.3  ALBUMIN 3.8  AST 34  ALT 79*  ALKPHOS 105  BILITOT 0.6   PT/INR No results for input(s): LABPROT, INR in the last 72 hours. Hepatitis Panel No results for input(s): HEPBSAG, HCVAB, HEPAIGM, HEPBIGM in the last 72 hours.  Studies/Results: Ct Abdomen Pelvis Wo Contrast  Result Date: 01/30/2016 CLINICAL DATA:  81 y/o F; right lower  quadrant abdominal pain. History colectomy, internal hemorrhoids, adenomatous colonic polyps, diverticulosis, diabetes, chronic kidney disease, and hysterectomy. EXAM: CT ABDOMEN AND PELVIS WITHOUT CONTRAST TECHNIQUE: Multidetector CT imaging of the abdomen and pelvis was performed following the standard protocol without IV contrast. COMPARISON:  06/11/2009 CT abdomen and pelvis. 01/30/2016 abdominal ultrasound. FINDINGS: Lower chest: Segmental ground-glass opacities in the right lower lobe along the major fissure probably represent areas of atelectasis. Hepatobiliary: No focal liver abnormality. Gallstones. No intra or extrahepatic biliary ductal dilatation. Pancreas: Unremarkable. No pancreatic ductal dilatation or surrounding inflammatory changes. Spleen: Normal in size without focal abnormality. Adrenals/Urinary Tract: Ectopic malrotated left kidney. Right kidney upper pole punctate nonobstructing stone. No hydronephrosis. Collapsed bladder. Stomach/Bowel: No obstructive or inflammatory changes of the bowel are identified. Status post colectomy with mild increase in dilatation of small bowel proximal to the rectum. Vascular/Lymphatic: Aortic atherosclerosis. No enlarged abdominal or pelvic lymph nodes. Reproductive: No abdominal wall hernia or abnormality. No abdominopelvic ascites. Other: Musculoskeletal: Interval progressive degenerative changes of the lumbar spine with increasing lower lumbar facet arthropathy and new grade 1 L4-5 degenerative anterolisthesis. No acute osseous abnormality is identified. Transitional L5 vertebral body with articulation of transverse processes with the sacral alae bilaterally. IMPRESSION: 1. Mild increase in dilatation of small bowel anastomosis  proximal to the rectum of uncertain significance. No definite obstructive or inflammatory changes of bowel identified. 2. Right kidney upper pole nonobstructing stone. 3. Gallstones. 4. Ectopic malrotated left kidney, stable. 5. Aortic  atherosclerosis. 6. Interval mild progression of lumbar spine degenerative changes. No acute osseous abnormality identified. Electronically Signed   By: Mitzi HansenLance  Furusawa-Stratton M.D.   On: 01/30/2016 03:47   Dg Chest 2 View  Result Date: 01/30/2016 CLINICAL DATA:  81 y/o F; nausea, vomiting, and loss of appetite for 3 days. EXAM: CHEST  2 VIEW COMPARISON:  01/20/2016 chest radiograph FINDINGS: Patient is rotated. The heart size and mediastinal contours are within normal limits and stable given projection and technique. Both lungs are clear. The visualized skeletal structures are unremarkable. Aortic atherosclerosis with calcification. IMPRESSION: No active cardiopulmonary disease.  Aortic atherosclerosis. Electronically Signed   By: Mitzi HansenLance  Furusawa-Stratton M.D.   On: 01/30/2016 01:38   [place] Abdomen Complete  Result Date: 01/30/2016 CLINICAL DATA:  Elevated LFTs, upper abdominal pain and nausea vomiting EXAM: ABDOMEN ULTRASOUND COMPLETE COMPARISON:  CT 06/11/2009 FINDINGS: Gallbladder: Echogenic stones are present in the gallbladder. A round non mobile soft tissue echogenicity measuring 8 mm is also visualized and may represent a polyp. Normal wall thickness. No sonographic Murphy's. Common bile duct: Diameter: Slightly enlarged measuring 9 mm in maximum diameter. There is a small echogenic stone in the proximal common bile duct. Liver: No focal lesion identified. Within normal limits in parenchymal echogenicity. Mild intra hepatic biliary dilatation suggested. IVC: No abnormality visualized. Pancreas: Poorly evaluated due to bowel gas Spleen: Size and appearance within normal limits. Right Kidney: Length: 9.5 cm. Echogenicity within normal limits. No mass or hydronephrosis visualized. Left Kidney: Length: 6.5 cm. Located in the low left abdomen. Slightly dysmorphic. No hydronephrosis Abdominal aorta: Obscured by bowel gas Other findings: None. IMPRESSION: 1. Gallstones and probable polyp in the gallbladder. No  sonographic evidence for acute cholecystitis 2. Slightly enlarged common bowel duct up to 9 mm with mild intra hepatic biliary dilatation. 5 mm echogenic stone present in the proximal common bowel duct. 3. Ectopic left kidney located in the low left abdomen. Kidney is somewhat small and dysmorphic in appearance. 4. Pancreas and aorta are poorly evaluated due to bowel gas Electronically Signed   By: Jasmine PangKim  Fujinaga M.D.   On: 01/30/2016 02:29   Mr Abdomen Mrcp Wo Contrast Mr 3d Recon At Scanner  Result Date: 01/31/2016 CLINICAL DATA:  81 year old with upper abdominal pain, nausea, vomiting and elevated liver function studies. Cholelithiasis and mild biliary dilatation on ultrasound. EXAM: MRI ABDOMEN WITHOUT CONTRAST  (INCLUDING MRCP) TECHNIQUE: Multiplanar multisequence MR imaging of the abdomen was performed. Heavily T2-weighted images of the biliary and pancreatic ducts were obtained, and three-dimensional MRCP images were rendered by post processing. COMPARISON:  Ultrasound and abdominal CT 01/30/2016. FINDINGS: Despite efforts by the technologist and patient, motion artifact is present on today's exam and could not be eliminated. This reduces exam sensitivity and specificity. Lower chest: The visualized lower chest appears unremarkable. No significant pleural effusion. Hepatobiliary: The liver appears unremarkable. Small gallstones are present. There is no gallbladder wall thickening. There is biliary dilatation. The common hepatic duct measures up to 13 mm in diameter. The common bile duct tapers distally, measuring 6 mm in diameter. No definite choledocholithiasis. Pancreas: The pancreas is atrophied. There are several cystic lesions within the pancreatic head, demonstrating no definite communication with the main pancreatic duct. These measure up to 13 x 8 mm in diameter. There is no pancreatic  ductal dilatation. Spleen: Normal in size without focal abnormality. Adrenals/Urinary Tract: Both adrenal glands  appear normal. The right kidney appears unremarkable. The left kidney is located in the false left pelvis and is malrotated with mild cortical thinning. Appearance is similar to the recent CT. Stomach/Bowel: There is proximal duodenal wall thickening with surrounding fluid/edema. Duodenal diverticulum noted. As noted on recent CT, there is mild small bowel dilatation in the false pelvis status post total colectomy. This is incompletely visualized. Vascular/Lymphatic: There are no enlarged abdominal lymph nodes. Aortic and branch vessel atherosclerosis, better seen on CT. Other: As above, there is some retroperitoneal edema/ fluid around the duodenum in the right upper quadrant. No generalized ascites. Musculoskeletal: No acute or significant osseous findings. Lumbar spondylosis noted. IMPRESSION: 1. Extrahepatic biliary dilatation without definite signs of choledocholithiasis. 2. Cholelithiasis without gallbladder wall thickening. 3. Wall thickening of the proximal duodenum with surrounding edema/fluid. This suggests duodenitis or peptic ulcer disease. Correlate clinically. 4. Atrophied pancreas with multiple cystic lesions in the pancreatic head, demonstrating no aggressive features on noncontrast imaging. Follow-up MRI at 2 years recommended (preferably with and without contrast if possible). This recommendation follows ACR consensus guidelines: Management of Incidental Pancreatic Cysts: A White Paper of the ACR Incidental Findings Committee. J Am Coll Radiol 2017;14:911-923. Electronically Signed   By: Carey Bullocks M.D.   On: 01/31/2016 08:42   Scheduled Meds: . atorvastatin  40 mg Oral Daily  . cholecalciferol  1,000 Units Oral Daily  . heparin  5,000 Units Subcutaneous Q8H  . Influenza vac split quadrivalent PF  0.5 mL Intramuscular Tomorrow-1000  . sodium chloride flush  3 mL Intravenous Q12H   Continuous Infusions: PRN Meds:.acetaminophen, bisacodyl, fentaNYL (SUBLIMAZE) injection,  hydrocortisone, loratadine, ondansetron **OR** ondansetron (ZOFRAN) IV, senna-docusate  ASSESMENT:   *  Right abd pain with abnormal LFTs. MRCP shows 13 mm common hepatic duct, 6 mm CBD, no choledocholithiasis.  Thickened/inflamed proximal duodenum  Duodenitis/PUD.  Gallstones. Pancreatic cysts, no worrisome features.   No PPI/H2B, no ASA/NSAIDs at home.  Do not find previous EGD.   LFTs much improved.   ? passed common bile duct stone?  *  Chronic Plavix for hx strokes.  SQ Heparin in place.   *  S/p 2010 subtotal colectomy for stricturing diverticulitis.  Adenomatous colon polyp 2000, 2002, 2005.  5 adenomatous polyps seen in 2010 resected colon. Flex Sig 05/2012: Hemorrhoidal banding at colorectal anastomosis, no polyps.       PLAN   *  Started Protonix 40 mg PO BID.  Check serum H pylori Abs with AM labs. .    *  Arrange for upper endoscopy on 02/02/16. Allow full liquid diet.    Jennye Moccasin  01/31/2016, 9:33 AM Pager: [phone]

## 2016-02-01 ENCOUNTER — Inpatient Hospital Stay (HOSPITAL_COMMUNITY): Payer: Medicare (Managed Care) | Admitting: Anesthesiology

## 2016-02-01 ENCOUNTER — Encounter (HOSPITAL_COMMUNITY): Payer: Self-pay | Admitting: *Deleted

## 2016-02-01 ENCOUNTER — Encounter (HOSPITAL_COMMUNITY)
Admission: EM | Disposition: A | Discharge status: Home Health | Payer: Self-pay | Source: Home / Self Care | Attending: Family Medicine

## 2016-02-01 DIAGNOSIS — K269 Duodenal ulcer, unspecified as acute or chronic, without hemorrhage or perforation: Secondary | ICD-10-CM

## 2016-02-01 DIAGNOSIS — K2981 Duodenitis with bleeding: Secondary | ICD-10-CM

## 2016-02-01 HISTORY — PX: ESOPHAGOGASTRODUODENOSCOPY: SHX5428

## 2016-02-01 LAB — GLUCOSE, CAPILLARY
GLUCOSE-CAPILLARY: 104 mg/dL — AB (ref 65–99)
GLUCOSE-CAPILLARY: 108 mg/dL — AB (ref 65–99)
Glucose-Capillary: 115 mg/dL — ABNORMAL HIGH (ref 65–99)
Glucose-Capillary: 115 mg/dL — ABNORMAL HIGH (ref 65–99)
Glucose-Capillary: 124 mg/dL — ABNORMAL HIGH (ref 65–99)
Glucose-Capillary: 97 mg/dL (ref 65–99)

## 2016-02-01 LAB — HEPATIC FUNCTION PANEL
ALT: 38 U/L (ref 14–54)
AST: 26 U/L (ref 15–41)
Albumin: 2.7 g/dL — ABNORMAL LOW (ref 3.5–5.0)
Alkaline Phosphatase: 78 U/L (ref 38–126)
BILIRUBIN TOTAL: 0.5 mg/dL (ref 0.3–1.2)
Total Protein: 5.7 g/dL — ABNORMAL LOW (ref 6.5–8.1)

## 2016-02-01 LAB — BASIC METABOLIC PANEL
ANION GAP: 7 (ref 5–15)
BUN: 33 mg/dL — ABNORMAL HIGH (ref 6–20)
CALCIUM: 8.5 mg/dL — AB (ref 8.9–10.3)
CO2: 20 mmol/L — ABNORMAL LOW (ref 22–32)
CREATININE: 1.84 mg/dL — AB (ref 0.44–1.00)
Chloride: 110 mmol/L (ref 101–111)
GFR, EST AFRICAN AMERICAN: 29 mL/min — AB (ref >60)
GFR, EST NON AFRICAN AMERICAN: 25 mL/min — AB (ref >60)
Glucose, Bld: 102 mg/dL — ABNORMAL HIGH (ref 65–99)
Potassium: 3.7 mmol/L (ref 3.5–5.1)
Sodium: 137 mmol/L (ref 135–145)

## 2016-02-01 SURGERY — EGD (ESOPHAGOGASTRODUODENOSCOPY)
Anesthesia: Monitor Anesthesia Care

## 2016-02-01 MED ORDER — LIDOCAINE HCL (CARDIAC) 20 MG/ML IV SOLN
INTRAVENOUS | Status: DC | PRN
Start: 2016-02-01 — End: 2016-02-01
  Administered 2016-02-01: 60 mg via INTRAVENOUS

## 2016-02-01 MED ORDER — SODIUM CHLORIDE 0.9 % IV SOLN
INTRAVENOUS | Status: DC
  Administered 2016-02-01: 12:00:00 via INTRAVENOUS

## 2016-02-01 MED ORDER — PHENYLEPHRINE HCL 10 MG/ML IJ SOLN
INTRAMUSCULAR | Status: DC | PRN
  Administered 2016-02-01 (×2): 80 ug via INTRAVENOUS

## 2016-02-01 MED ORDER — GLYCOPYRROLATE 0.2 MG/ML IJ SOLN
INTRAMUSCULAR | Status: DC | PRN
  Administered 2016-02-01: 0.2 mg via INTRAVENOUS

## 2016-02-01 MED ORDER — PROPOFOL 500 MG/50ML IV EMUL
INTRAVENOUS | Status: DC | PRN
  Administered 2016-02-01: 50 ug/kg/min via INTRAVENOUS

## 2016-02-01 MED ORDER — LACTATED RINGERS IV SOLN
INTRAVENOUS | Status: DC
  Administered 2016-02-01: 1000 mL via INTRAVENOUS

## 2016-02-01 MED ORDER — PROPOFOL 10 MG/ML IV BOLUS
INTRAVENOUS | Status: DC | PRN
  Administered 2016-02-01: 25 mg via INTRAVENOUS

## 2016-02-01 NOTE — Transfer of Care (Signed)
Immediate Anesthesia Transfer of Care Note  Patient: [name]  Procedure(s) Performed: Procedure(s): ESOPHAGOGASTRODUODENOSCOPY (EGD) (N/A)  Patient Location: Endoscopy Unit  Anesthesia Type:MAC  Level of Consciousness: awake and alert   Airway & Oxygen Therapy: Patient Spontanous Breathing and Patient connected to nasal cannula oxygen  Post-op Assessment: Report given to RN and Post -op Vital signs reviewed and stable  Post vital signs: Reviewed and stable  Last Vitals:  Vitals:   02/01/16 1029 02/01/16 1216  BP: (!) 101/51 (!) 71/35  Pulse:  94  Resp: 18 18  Temp: 37.3 C 37.1 C    Last Pain:  Vitals:   02/01/16 1216  TempSrc: Oral  PainSc:       Patients Stated Pain Goal: 0 (37/90/24 0973)  Complications: No apparent anesthesia complications

## 2016-02-01 NOTE — Progress Notes (Signed)
PROGRESS NOTE    [name]  WUJ:[id number] DOB: 02/17/1935 DOA: 01/29/2016 PCP: [name], MD    Brief Narrative:   81 y.o. female with medical history significant for hypertension, chronic kidney disease stage III, history of CVA with residual dysarthria and hemiparesis, and dementia who presents to the emergency department with upper abdominal pain, nausea, vomiting, and loss of appetite of 3 days' duration.   Currently being evaluated for choledocholithiasis.  Assessment & Plan:   Principal Problem:   Choledocholithiasis - GI on board and managing. Started pt on protonix, s/p upper endoscopy. Please see recommendations on their report. Plan is to hold plavix and administer PPI BID  Active Problems:   Hypertension    History of CVA (cerebrovascular accident) - No neurological deficits reported    CKD (chronic kidney disease) stage 3, GFR 30-59 ml/min   Acute kidney injury (HCC) - Most likely secondary to prerenal etiology given history of poor oral intake.  - improving with IVF rehydration and improvement in oral intake. - reassess BMP next am order placed.  DVT prophylaxis: Heparin Code Status: Full Family Communication: None at bedside Disposition Plan: Pending improvement in condition   Consultants:   Gastroenterology   Procedures: None  Antimicrobials: None  Subjective: Patient has no new complaints.   Objective: Vitals:   02/01/16 1029 02/01/16 1216 02/01/16 1220 02/01/16 1225  BP: (!) 101/51 (!) 71/35 (!) 91/42 (!) 109/50  Pulse:  94 96 93  Resp: 18 18 14 16   Temp: 99.1 F (37.3 C) 98.7 F (37.1 C)    TempSrc: Oral Oral    SpO2: 99% 100% 100% 100%  Weight:      Height:        Intake/Output Summary (Last 24 hours) at 02/01/16 1732 Last data filed at 02/01/16 0900  Gross per 24 hour  Intake              240 ml  Output              301 ml  Net              -61 ml   Filed Weights   01/30/16 0627 01/30/16 2122  Weight:  61.4 kg (135 lb 5.8 oz) 61.3 kg (135 lb 2.3 oz)    Examination:  General exam: Appears calm and comfortable  Respiratory system: Clear to auscultation. Respiratory effort normal. Cardiovascular system: S1 & S2 heard, RRR. No JVD, murmurs, rubs, gallops or clicks. No pedal edema. Gastrointestinal system: Abdomen is nondistended, soft , No guarding Central nervous system: Alert and oriented. No focal neurological deficits. Extremities: Symmetric 5 x 5 power, no cyanosis Skin: No rashes, lesions or ulcers, on limited exam. Psychiatry:  Mood & affect appropriate.   Data Reviewed: I have personally reviewed following labs and imaging studies  CBC:  Recent Labs Lab 01/30/16 0037  WBC 5.9  NEUTROABS 4.5  HGB 12.3  HCT 37.8  MCV 84.9  PLT 265   Basic Metabolic Panel:  Recent Labs Lab 01/30/16 0037 01/31/16 0355 02/01/16 0544  NA 135 135 137  K 4.5 3.5 3.7  CL 104 110 110  CO2 18* 18* 20*  GLUCOSE 115* 100* 102*  BUN 52* 40* 33*  CREATININE 3.76* 2.08* 1.84*  CALCIUM 9.4 8.1* 8.5*   GFR: Estimated Creatinine Clearance: 23.6 mL/min (by C-G formula based on SCr of 1.84 mg/dL (H)). Liver Function Tests:  Recent Labs Lab 01/30/16 0037 02/01/16 1236  AST 34 26  ALT 79*  38  ALKPHOS 105 78  BILITOT 0.6 0.5  PROT 7.3 5.7*  ALBUMIN 3.8 2.7*    Recent Labs Lab 01/30/16 0037  LIPASE 18   No results for input(s): AMMONIA in the last 168 hours. Coagulation Profile: No results for input(s): INR, PROTIME in the last 168 hours. Cardiac Enzymes:  Recent Labs Lab 01/30/16 0037  TROPONINI <0.03   BNP (last 3 results) No results for input(s): PROBNP in the last 8760 hours. HbA1C: No results for input(s): HGBA1C in the last 72 hours. CBG:  Recent Labs Lab 02/01/16 0023 02/01/16 0553 02/01/16 0824 02/01/16 1244 02/01/16 1624  GLUCAP 115* 104* 108* 97 115*   Lipid Profile: No results for input(s): CHOL, HDL, LDLCALC, TRIG, CHOLHDL, LDLDIRECT in the last 72  hours. Thyroid Function Tests: No results for input(s): TSH, T4TOTAL, FREET4, T3FREE, THYROIDAB in the last 72 hours. Anemia Panel: No results for input(s): VITAMINB12, FOLATE, FERRITIN, TIBC, IRON, RETICCTPCT in the last 72 hours. Sepsis Labs: No results for input(s): PROCALCITON, LATICACIDVEN in the last 168 hours.  No results found for this or any previous visit (from the past 240 hour(s)).       Radiology Studies: Mr Abdomen Mrcp Wo Contrast  Result Date: 01/31/2016 CLINICAL DATA:  81 year old with upper abdominal pain, nausea, vomiting and elevated liver function studies. Cholelithiasis and mild biliary dilatation on ultrasound. EXAM: MRI ABDOMEN WITHOUT CONTRAST  (INCLUDING MRCP) TECHNIQUE: Multiplanar multisequence MR imaging of the abdomen was performed. Heavily T2-weighted images of the biliary and pancreatic ducts were obtained, and three-dimensional MRCP images were rendered by post processing. COMPARISON:  Ultrasound and abdominal CT 01/30/2016. FINDINGS: Despite efforts by the technologist and patient, motion artifact is present on today's exam and could not be eliminated. This reduces exam sensitivity and specificity. Lower chest: The visualized lower chest appears unremarkable. No significant pleural effusion. Hepatobiliary: The liver appears unremarkable. Small gallstones are present. There is no gallbladder wall thickening. There is biliary dilatation. The common hepatic duct measures up to 13 mm in diameter. The common bile duct tapers distally, measuring 6 mm in diameter. No definite choledocholithiasis. Pancreas: The pancreas is atrophied. There are several cystic lesions within the pancreatic head, demonstrating no definite communication with the main pancreatic duct. These measure up to 13 x 8 mm in diameter. There is no pancreatic ductal dilatation. Spleen: Normal in size without focal abnormality. Adrenals/Urinary Tract: Both adrenal glands appear normal. The right kidney  appears unremarkable. The left kidney is located in the false left pelvis and is malrotated with mild cortical thinning. Appearance is similar to the recent CT. Stomach/Bowel: There is proximal duodenal wall thickening with surrounding fluid/edema. Duodenal diverticulum noted. As noted on recent CT, there is mild small bowel dilatation in the false pelvis status post total colectomy. This is incompletely visualized. Vascular/Lymphatic: There are no enlarged abdominal lymph nodes. Aortic and branch vessel atherosclerosis, better seen on CT. Other: As above, there is some retroperitoneal edema/ fluid around the duodenum in the right upper quadrant. No generalized ascites. Musculoskeletal: No acute or significant osseous findings. Lumbar spondylosis noted. IMPRESSION: 1. Extrahepatic biliary dilatation without definite signs of choledocholithiasis. 2. Cholelithiasis without gallbladder wall thickening. 3. Wall thickening of the proximal duodenum with surrounding edema/fluid. This suggests duodenitis or peptic ulcer disease. Correlate clinically. 4. Atrophied pancreas with multiple cystic lesions in the pancreatic head, demonstrating no aggressive features on noncontrast imaging. Follow-up MRI at 2 years recommended (preferably with and without contrast if possible). This recommendation follows ACR consensus guidelines: Management  of Incidental Pancreatic Cysts: A White Paper of the ACR Incidental Findings Committee. J Am Coll Radiol 2017;14:911-923. Electronically Signed   By: Carey BullocksWilliam  Veazey M.D.   On: 01/31/2016 08:42   Mr 3d Recon At Scanner  Result Date: 01/31/2016 CLINICAL DATA:  81 year old with upper abdominal pain, nausea, vomiting and elevated liver function studies. Cholelithiasis and mild biliary dilatation on ultrasound. EXAM: MRI ABDOMEN WITHOUT CONTRAST  (INCLUDING MRCP) TECHNIQUE: Multiplanar multisequence MR imaging of the abdomen was performed. Heavily T2-weighted images of the biliary and  pancreatic ducts were obtained, and three-dimensional MRCP images were rendered by post processing. COMPARISON:  Ultrasound and abdominal CT 01/30/2016. FINDINGS: Despite efforts by the technologist and patient, motion artifact is present on today's exam and could not be eliminated. This reduces exam sensitivity and specificity. Lower chest: The visualized lower chest appears unremarkable. No significant pleural effusion. Hepatobiliary: The liver appears unremarkable. Small gallstones are present. There is no gallbladder wall thickening. There is biliary dilatation. The common hepatic duct measures up to 13 mm in diameter. The common bile duct tapers distally, measuring 6 mm in diameter. No definite choledocholithiasis. Pancreas: The pancreas is atrophied. There are several cystic lesions within the pancreatic head, demonstrating no definite communication with the main pancreatic duct. These measure up to 13 x 8 mm in diameter. There is no pancreatic ductal dilatation. Spleen: Normal in size without focal abnormality. Adrenals/Urinary Tract: Both adrenal glands appear normal. The right kidney appears unremarkable. The left kidney is located in the false left pelvis and is malrotated with mild cortical thinning. Appearance is similar to the recent CT. Stomach/Bowel: There is proximal duodenal wall thickening with surrounding fluid/edema. Duodenal diverticulum noted. As noted on recent CT, there is mild small bowel dilatation in the false pelvis status post total colectomy. This is incompletely visualized. Vascular/Lymphatic: There are no enlarged abdominal lymph nodes. Aortic and branch vessel atherosclerosis, better seen on CT. Other: As above, there is some retroperitoneal edema/ fluid around the duodenum in the right upper quadrant. No generalized ascites. Musculoskeletal: No acute or significant osseous findings. Lumbar spondylosis noted. IMPRESSION: 1. Extrahepatic biliary dilatation without definite signs of  choledocholithiasis. 2. Cholelithiasis without gallbladder wall thickening. 3. Wall thickening of the proximal duodenum with surrounding edema/fluid. This suggests duodenitis or peptic ulcer disease. Correlate clinically. 4. Atrophied pancreas with multiple cystic lesions in the pancreatic head, demonstrating no aggressive features on noncontrast imaging. Follow-up MRI at 2 years recommended (preferably with and without contrast if possible). This recommendation follows ACR consensus guidelines: Management of Incidental Pancreatic Cysts: A White Paper of the ACR Incidental Findings Committee. J Am Coll Radiol 2017;14:911-923. Electronically Signed   By: Carey BullocksWilliam  Veazey M.D.   On: 01/31/2016 08:42    Scheduled Meds: . atorvastatin  40 mg Oral Daily  . cholecalciferol  1,000 Units Oral Daily  . heparin  5,000 Units Subcutaneous Q8H  . Influenza vac split quadrivalent PF  0.5 mL Intramuscular Tomorrow-1000  . pantoprazole  40 mg Oral BID  . sodium chloride flush  3 mL Intravenous Q12H   Continuous Infusions:    LOS: 2 days    Time spent: > 35 minutes  Penny PiaVEGA, Adorian Gwynne, MD [facility] Pager [phone]  If 7PM-7AM, please contact night-coverage [email or web address] Password [email or web address] 02/01/2016, 5:32 PM

## 2016-02-01 NOTE — Anesthesia Postprocedure Evaluation (Signed)
Anesthesia Post Note  Patient: [name]  Procedure(s) Performed: Procedure(s) (LRB): ESOPHAGOGASTRODUODENOSCOPY (EGD) (N/A)  Patient location during evaluation: Endoscopy Anesthesia Type: MAC Level of consciousness: awake and alert Pain management: pain level controlled Vital Signs Assessment: post-procedure vital signs reviewed and stable Respiratory status: spontaneous breathing, nonlabored ventilation, respiratory function stable and patient connected to nasal cannula oxygen Cardiovascular status: stable and blood pressure returned to baseline Anesthetic complications: no       Last Vitals:  Vitals:   02/01/16 1220 02/01/16 1225  BP: (!) 91/42 (!) 109/50  Pulse: 96 93  Resp: 14 16  Temp:      Last Pain:  Vitals:   02/01/16 1216  TempSrc: Oral  PainSc:                  Catalina Gravel

## 2016-02-01 NOTE — Anesthesia Preprocedure Evaluation (Addendum)
Anesthesia Evaluation  Patient identified by MRN, date of birth, ID band Patient awake    Reviewed: Allergy & Precautions, NPO status , Patient's Chart, lab work & pertinent test results  Airway Mallampati: II  TM Distance: >3 FB Neck ROM: Full    Dental  (+) Dental Advisory Given, Edentulous Upper, Edentulous Lower   Pulmonary asthma , former smoker,    Pulmonary exam normal breath sounds clear to auscultation       Cardiovascular hypertension, Pt. on medications Normal cardiovascular exam Rhythm:Regular Rate:Normal  HLD   Neuro/Psych PSYCHIATRIC DISORDERS Depression Dementia CVA (right sided deficits, slurred speech), Residual Symptoms    GI/Hepatic Neg liver ROS, PUD,   Endo/Other  diabetes, Type 2  Renal/GU Renal InsufficiencyRenal disease     Musculoskeletal  (+) Arthritis ,   Abdominal   Peds  Hematology  (+) Blood dyscrasia (Plavix therapy), anemia ,   Anesthesia Other Findings Day of surgery medications reviewed with the patient.  Reproductive/Obstetrics                            Anesthesia Physical Anesthesia Plan  ASA: III  Anesthesia Plan: MAC   Post-op Pain Management:    Induction: Intravenous  Airway Management Planned: Nasal Cannula  Additional Equipment:   Intra-op Plan:   Post-operative Plan:   Informed Consent: I have reviewed the patients History and Physical, chart, labs and discussed the procedure including the risks, benefits and alternatives for the proposed anesthesia with the patient or authorized representative who has indicated his/her understanding and acceptance.   Dental advisory given  Plan Discussed with: CRNA and Anesthesiologist  Anesthesia Plan Comments: (Discussed risks/benefits/alternatives to MAC sedation including need for ventilatory support, hypotension, need for conversion to general anesthesia.  All patient questions answered.   Patient/guardian wishes to proceed.)        Anesthesia Quick Evaluation

## 2016-02-01 NOTE — Op Note (Signed)
[facility] Patient Name: [name] Procedure Date : 02/01/2016 MRN: [id number] Attending MD: Emma Fiedler , MD Date of Birth: 14-May-1935 CSN: [id number] Age: 81 Admit Type: Inpatient Procedure:                Upper GI endoscopy Indications:              Abdominal pain in the right upper quadrant,                            Abnormal MRI of the GI tract Providers:                Carie Caddy. Rhea Belton, MD, Dwain Sarna, RN, Darletta Moll                            Tech, Technician, Sarita Haver, CRNA Referring MD:             [facility] Medicines:                Monitored Anesthesia Care Complications:            No immediate complications. Estimated Blood Loss:     Estimated blood loss was minimal. Procedure:                Pre-Anesthesia Assessment:                           - Prior to the procedure, a History and Physical                            was performed, and patient medications and                            allergies were reviewed. The patient's tolerance of                            previous anesthesia was also reviewed. The risks                            and benefits of the procedure and the sedation                            options and risks were discussed with the patient.                            All questions were answered, and informed consent                            was obtained. Prior Anticoagulants: The patient has                            taken no previous anticoagulant or antiplatelet                            agents. ASA Grade Assessment: III - A patient with  severe systemic disease. After reviewing the risks                            and benefits, the patient was deemed in                            satisfactory condition to undergo the procedure.                           After obtaining informed consent, the endoscope was                            passed under direct vision. Throughout the                             procedure, the patient's blood pressure, pulse, and                            oxygen saturations were monitored continuously. The                            Endoscope was introduced through the mouth, and                            advanced to the second part of duodenum. The upper                            GI endoscopy was accomplished without difficulty.                            The patient tolerated the procedure well. Scope In: Scope Out: Findings:      A non-obstructing Schatzki ring (acquired) was found at the       gastroesophageal junction.      A 4 cm hiatal hernia was present.      The esophagus was otherwise without abnormality.      Normal mucosa was found in the entire examined stomach. Biopsies were       taken with a cold forceps for histology and Helicobacter pylori testing       from the gastric body, antrum, and incisura.      Multiple non-obstructing non-actively bleeding duodenal ulcers with       pigmented material were found in the duodenal bulb and in the second       portion of the duodenum. These ulcers extending into the second portion       of the duodenum including in the area near the ampulla. Several of these       ulcerations were biopsied with a cold forceps for histology. Etiology       not clear but given extension into the second portion of the duodenum,       query ischemic type injury (biopsies obtained from the stomach to       exclude H. pylori). The more distal duodenum (distal to D2) appeared       normal. Impression:               - Non-obstructing Schatzki ring.                           -  4 cm hiatal hernia.                           - The esophagus was otherwise normal.                           - Normal mucosa was found in the entire stomach.                            Biopsied to evaluate for H. pylori                           - Ulcerated duodenal mucosa in the bulb and second                             portion of the duodenum (etiology unlcear query                            ischemic injury). Biopsied. Moderate Sedation:      N/A Recommendation:           - Return patient to hospital ward for ongoing care.                           - Resume previous soft diet.                           - Avoid addition aspirin and all ibuprofen,                            naproxen, or other non-steroidal anti-inflammatory                            drugs.                           - Await pathology results.                           - Would hold Plavix for 7 days given multiple                            ulcerations seen today.                           - Twice a day PPI for at least 8 weeks then once                            daily. Procedure Code(s):        --- Professional ---                           [phone], Esophagogastroduodenoscopy, flexible,                            transoral; with biopsy, single or multiple Diagnosis Code(s):        ---  Professional ---                           K22.2, Esophageal obstruction                           K44.9, Diaphragmatic hernia without obstruction or                            gangrene                           K26.9, Duodenal ulcer, unspecified as acute or                            chronic, without hemorrhage or perforation                           R10.11, Right upper quadrant pain                           R93.3, Abnormal findings on diagnostic imaging of                            other parts of digestive tract CPT copyright 2016 [employer]. All rights reserved. The codes documented in this report are preliminary and upon coder review may  be revised to meet current compliance requirements. Emma FiedlerJay M Elleah Hemsley, MD 02/01/2016 12:17:51 PM This report has been signed electronically. Number of Addenda: 0

## 2016-02-02 ENCOUNTER — Telehealth: Payer: Self-pay | Admitting: *Deleted

## 2016-02-02 ENCOUNTER — Encounter (HOSPITAL_COMMUNITY): Payer: Self-pay | Admitting: Internal Medicine

## 2016-02-02 LAB — GLUCOSE, CAPILLARY
GLUCOSE-CAPILLARY: 138 mg/dL — AB (ref 65–99)
GLUCOSE-CAPILLARY: 157 mg/dL — AB (ref 65–99)
Glucose-Capillary: 102 mg/dL — ABNORMAL HIGH (ref 65–99)

## 2016-02-02 LAB — BASIC METABOLIC PANEL
ANION GAP: 5 (ref 5–15)
BUN: 32 mg/dL — ABNORMAL HIGH (ref 6–20)
CHLORIDE: 111 mmol/L (ref 101–111)
CO2: 22 mmol/L (ref 22–32)
Calcium: 8.4 mg/dL — ABNORMAL LOW (ref 8.9–10.3)
Creatinine, Ser: 2.04 mg/dL — ABNORMAL HIGH (ref 0.44–1.00)
GFR calc non Af Amer: 22 mL/min — ABNORMAL LOW (ref >60)
GFR, EST AFRICAN AMERICAN: 26 mL/min — AB (ref >60)
GLUCOSE: 97 mg/dL (ref 65–99)
POTASSIUM: 4 mmol/L (ref 3.5–5.1)
Sodium: 138 mmol/L (ref 135–145)

## 2016-02-02 NOTE — Evaluation (Signed)
Physical Therapy Evaluation Patient Details Name: [name] MRN: [id number] DOB: 03-28-1935 Today's Date: 02/02/2016   History of Present Illness  [name] is a 81 y.o. female with medical history significant for hypertension, chronic kidney disease stage III, history of CVA with residual dysarthria and hemiparesis, and dementia who presents to the emergency department with upper abdominal pain, nausea, vomiting, and loss of appetite of 3 days' duration.  Clinical Impression  Pt admitted with/for GI problems above.  Pt is currently not at her baseline functioning and needing min to light moderate assist at times..  Pt currently limited functionally due to the problems listed below.  (see problems list.)  Pt will benefit from PT to maximize function and safety to be able to get home safely with available assist of family.     Follow Up Recommendations Home health PT;Other (comment) (close to 24/7 supervision/assist)    Equipment Recommendations  None recommended by PT    Recommendations for Other Services       Precautions / Restrictions Precautions Precautions: Fall      Mobility  Bed Mobility Overal bed mobility: Needs Assistance Bed Mobility: Supine to Sit;Sit to Supine     Supine to sit: Min assist Sit to supine: Min guard   General bed mobility comments: stuggled to sit up.  Unable to make forward progress to EOB without assist  Transfers Overall transfer level: Needs assistance   Transfers: Sit to/from Stand Sit to Stand: Mod assist         General transfer comment: light mod assist to help come forward and up.  Ambulation/Gait Ambulation/Gait assistance: Min assist Ambulation Distance (Feet): 50 Feet Assistive device: Rolling walker (2 wheeled) Gait Pattern/deviations: Step-through pattern Gait velocity: slow Gait velocity interpretation: Below normal speed for age/gender General Gait Details: Gait retropulsive in nature with parikinson like  festinations and some difficulty maneuvering the RW.  Stairs            Wheelchair Mobility    Modified Rankin (Stroke Patients Only)       Balance Overall balance assessment: Needs assistance Sitting-balance support: No upper extremity supported Sitting balance-Leahy Scale: Fair       Standing balance-Leahy Scale: Poor Standing balance comment: list posteriorly and reliant on AD and external support                             Pertinent Vitals/Pain Pain Assessment: Faces Faces Pain Scale: No hurt    Home Living Family/patient expects to be discharged to:: Private residence Living Arrangements: Children Available Help at Discharge: Family;Available PRN/intermittently;Other (Comment) (PACE) Type of Home: House Home Access: Stairs to enter     Home Layout: One level Home Equipment: Environmental consultant - 2 wheels;Shower seat;Grab bars - tub/shower      Prior Function Level of Independence: Independent with assistive device(s)   Gait / Transfers Assistance Needed: used RW or cane to get around the home.           Hand Dominance        Extremity/Trunk Assessment   Upper Extremity Assessment Upper Extremity Assessment: Defer to OT evaluation    Lower Extremity Assessment Lower Extremity Assessment: Generalized weakness;RLE deficits/detail;LLE deficits/detail RLE Deficits / Details: halting and mildly uncoordinated, strength grossly 4-/5 RLE Coordination: decreased fine motor LLE Deficits / Details: grossly 4/5 LLE Coordination: decreased fine motor       Communication   Communication: No difficulties  Cognition Arousal/Alertness: Awake/alert  Behavior During Therapy: WFL for tasks assessed/performed Overall Cognitive Status: Within Functional Limits for tasks assessed                      General Comments      Exercises     Assessment/Plan    PT Assessment Patient needs continued PT services  PT Problem List Decreased  strength;Decreased activity tolerance;Decreased balance;Decreased mobility;Decreased coordination;Decreased knowledge of use of DME          PT Treatment Interventions Gait training;DME instruction;Functional mobility training;Therapeutic activities;Balance training;Patient/family education    PT Goals (Current goals can be found in the Care Plan section)  Acute Rehab PT Goals Patient Stated Goal: go home PT Goal Formulation: With patient Time For Goal Achievement: 02/16/16 Potential to Achieve Goals: Good    Frequency Min 3X/week   Barriers to discharge   It's possible, even with PACE and PCA's that pt is at home alone for short periods, which may be too much with her present decreased function.    Co-evaluation               End of Session   Activity Tolerance: Patient tolerated treatment well Patient left: in bed;with call bell/phone within reach;with bed alarm set Nurse Communication: Mobility status         Time: 1505-1530 PT Time Calculation (min) (ACUTE ONLY): 25 min   Charges:   PT Evaluation $PT Eval Moderate Complexity: 1 Procedure PT Treatments $Gait Training: 8-22 mins   PT G CodesEliseo Gum:        Daven Pinckney V Jayshun Galentine 02/02/2016, 3:57 PM  02/02/2016  [place] BingKen Frandy Basnett, PT [phone]  (pager)

## 2016-02-02 NOTE — Telephone Encounter (Signed)
Appointment scheduled with Hyacinth MeekerJennifer Lemmon, PA on 03/07/16 @ 1:15 pm. I have left voicemail for patient to call back.

## 2016-02-02 NOTE — Progress Notes (Signed)
PROGRESS NOTE    [name]  ZOX:[id number] DOB: 11/10/1935 DOA: 01/29/2016 PCP: [name], MD    Brief Narrative:   81 y.o. female with medical history significant for hypertension, chronic kidney disease stage III, history of CVA with residual dysarthria and hemiparesis, and dementia who presents to the emergency department with upper abdominal pain, nausea, vomiting, and loss of appetite of 3 days' duration.    being evaluated for choledocholithiasis and peptic ulcer  Assessment & Plan:   abdominal pain n/v, with lft elevation on presentation: Symptom likely from Choledocholithiasis/duodenal ulcer S/p mrcp, Strong suspicion of past CBD stone causing transient biliary obstruction Symptom resolved, lft normalized -  s/p upper endoscopy. Started pt on protonix,. Plan is to hold plavix with gi follow up,  -diet advancement per gi  Acute kidney injury (HCC) on CKD (chronic kidney disease) stage 3, GFR 30-59 ml/min -cr 3.76 on presentation, ua no infection but is concentrated    - Most likely secondary to prerenal etiology given history of poor oral intake.  - improving with IVF rehydration and improvement in oral intake. - cr back to baseline    Hypertension bp low normal in the hospital, home bp meds held    History of CVA (cerebrovascular accident) (h/o right parietal occipital infarct on CT head in 05/2015) - baseline dysarthria, impaired coordination at baseline -plavix hold for a week per gi, continue statin  H/o PVD: plavix hold for a week, she is followed by vascular surgery   DVT prophylaxis: Heparin Code Status: Full Family Communication: daughter at bedside Disposition Plan: home with home health in am   Consultants:   Gastroenterology   Procedures: None  Antimicrobials: None  Subjective: Patient has no new complaints. Daughter at bedside  Objective: Vitals:   02/01/16 2028 02/02/16 0531 02/02/16 1100 02/02/16 1821  BP: (!) 108/56  (!) 95/51 104/60 (!) 110/55  Pulse: 75 75 72 80  Resp: 18 17 18 18   Temp: 98.1 F (36.7 C) 98 F (36.7 C) 97.8 F (36.6 C) 98.4 F (36.9 C)  TempSrc:   Oral Oral  SpO2: 100% 98% 98% 98%  Weight: 61.3 kg (135 lb 2.3 oz)     Height:        Intake/Output Summary (Last 24 hours) at 02/02/16 1920 Last data filed at 02/02/16 0915  Gross per 24 hour  Intake              240 ml  Output                0 ml  Net              240 ml   Filed Weights   01/30/16 0627 01/30/16 2122 02/01/16 2028  Weight: 61.4 kg (135 lb 5.8 oz) 61.3 kg (135 lb 2.3 oz) 61.3 kg (135 lb 2.3 oz)    Examination:  General exam: Appears calm and comfortable  Respiratory system: Clear to auscultation. Respiratory effort normal. Cardiovascular system: S1 & S2 heard, RRR. No JVD, murmurs, rubs, gallops or clicks. No pedal edema. Gastrointestinal system: Abdomen is nondistended, soft , No guarding Central nervous system: Alert and oriented. No focal neurological deficits. Extremities: Symmetric 5 x 5 power, no cyanosis Skin: No rashes, lesions or ulcers, on limited exam. Psychiatry:  Mood & affect appropriate.  Neuro: dysarthria and poor coordination at baseline, patient is hard to understand due to dysarthria, but daughter is able to interpret   Data Reviewed: I have personally reviewed following labs  and imaging studies  CBC:  Recent Labs Lab 01/30/16 0037  WBC 5.9  NEUTROABS 4.5  HGB 12.3  HCT 37.8  MCV 84.9  PLT 265   Basic Metabolic Panel:  Recent Labs Lab 01/30/16 0037 01/31/16 0355 02/01/16 0544 02/02/16 0557  NA 135 135 137 138  K 4.5 3.5 3.7 4.0  CL 104 110 110 111  CO2 18* 18* 20* 22  GLUCOSE 115* 100* 102* 97  BUN 52* 40* 33* 32*  CREATININE 3.76* 2.08* 1.84* 2.04*  CALCIUM 9.4 8.1* 8.5* 8.4*   GFR: Estimated Creatinine Clearance: 21.3 mL/min (by C-G formula based on SCr of 2.04 mg/dL (H)). Liver Function Tests:  Recent Labs Lab 01/30/16 0037 02/01/16 1236  AST 34 26    ALT 79* 38  ALKPHOS 105 78  BILITOT 0.6 0.5  PROT 7.3 5.7*  ALBUMIN 3.8 2.7*    Recent Labs Lab 01/30/16 0037  LIPASE 18   No results for input(s): AMMONIA in the last 168 hours. Coagulation Profile: No results for input(s): INR, PROTIME in the last 168 hours. Cardiac Enzymes:  Recent Labs Lab 01/30/16 0037  TROPONINI <0.03   BNP (last 3 results) No results for input(s): PROBNP in the last 8760 hours. HbA1C: No results for input(s): HGBA1C in the last 72 hours. CBG:  Recent Labs Lab 02/01/16 1624 02/01/16 2357 02/02/16 0758 02/02/16 1158 02/02/16 1630  GLUCAP 115* 124* 102* 138* 157*   Lipid Profile: No results for input(s): CHOL, HDL, LDLCALC, TRIG, CHOLHDL, LDLDIRECT in the last 72 hours. Thyroid Function Tests: No results for input(s): TSH, T4TOTAL, FREET4, T3FREE, THYROIDAB in the last 72 hours. Anemia Panel: No results for input(s): VITAMINB12, FOLATE, FERRITIN, TIBC, IRON, RETICCTPCT in the last 72 hours. Sepsis Labs: No results for input(s): PROCALCITON, LATICACIDVEN in the last 168 hours.  No results found for this or any previous visit (from the past 240 hour(s)).       Radiology Studies: No results found.  Scheduled Meds: . atorvastatin  40 mg Oral Daily  . cholecalciferol  1,000 Units Oral Daily  . heparin  5,000 Units Subcutaneous Q8H  . Influenza vac split quadrivalent PF  0.5 mL Intramuscular Tomorrow-1000  . pantoprazole  40 mg Oral BID  . sodium chloride flush  3 mL Intravenous Q12H   Continuous Infusions:    LOS: 3 days    Time spent: > 35 minutes  Peaches Vanoverbeke, MD PhD [facility] Pager [phone]  If 7PM-7AM, please contact night-coverage [email or web address] Password [email or web address] 02/02/2016, 7:20 PM

## 2016-02-02 NOTE — Progress Notes (Signed)
Daily Rounding Note  02/02/2016, 11:21 AM  LOS: 3 days   SUBJECTIVE:   Chief complaint: abdominal pain and n/v: all resolved Asking when I think she may go home  No complaints  OBJECTIVE:         Vital signs in last 24 hours:    Temp:  [98 F (36.7 C)-98.7 F (37.1 C)] 98 F (36.7 C) (01/10 0531) Pulse Rate:  [75-96] 75 (01/10 0531) Resp:  [14-18] 17 (01/10 0531) BP: (71-109)/(35-56) 95/51 (01/10 0531) SpO2:  [98 %-100 %] 98 % (01/10 0531) Weight:  [61.3 kg (135 lb 2.3 oz)] 61.3 kg (135 lb 2.3 oz) (01/09 2028) Last BM Date: 02/01/16 Filed Weights   01/30/16 0627 01/30/16 2122 02/01/16 2028  Weight: 61.4 kg (135 lb 5.8 oz) 61.3 kg (135 lb 2.3 oz) 61.3 kg (135 lb 2.3 oz)   General: pleasant, frail but not uncomfortable or acutely ill looking   Heart: RRR Chest: clear bil.   Abdomen: firm in LLQ.  Active BS.  NT.    Extremities: no CCE. Some muscle atrophy in limbs Neuro/Psych:  Oriented x 3.  Dysarthria but not aphasic.  Contracture deformity in right hand  Intake/Output from previous day: 01/09 0701 - 01/10 0700 In: 240 [P.O.:240] Out: 1 [Stool:1]  Intake/Output this shift: No intake/output data recorded.  Lab Results: No results for input(s): WBC, HGB, HCT, PLT in the last 72 hours. BMET  Recent Labs  01/31/16 0355 02/01/16 0544 02/02/16 0557  NA 135 137 138  K 3.5 3.7 4.0  CL 110 110 111  CO2 18* 20* 22  GLUCOSE 100* 102* 97  BUN 40* 33* 32*  CREATININE 2.08* 1.84* 2.04*  CALCIUM 8.1* 8.5* 8.4*   LFT  Recent Labs  02/01/16 1236  PROT 5.7*  ALBUMIN 2.7*  AST 26  ALT 38  ALKPHOS 78  BILITOT 0.5  BILIDIR <0.1*  IBILI NOT CALCULATED   PT/INR No results for input(s): LABPROT, INR in the last 72 hours. Hepatitis Panel No results for input(s): HEPBSAG, HCVAB, HEPAIGM, HEPBIGM in the last 72 hours.  Studies/Results: No results found.   Scheduled Meds: . atorvastatin  40 mg Oral  Daily  . cholecalciferol  1,000 Units Oral Daily  . heparin  5,000 Units Subcutaneous Q8H  . Influenza vac split quadrivalent PF  0.5 mL Intramuscular Tomorrow-1000  . pantoprazole  40 mg Oral BID  . sodium chloride flush  3 mL Intravenous Q12H   Continuous Infusions: PRN Meds:.acetaminophen, bisacodyl, fentaNYL (SUBLIMAZE) injection, hydrocortisone, loratadine, ondansetron **OR** ondansetron (ZOFRAN) IV, senna-docusate   ASSESMENT:   *  Right abd pain with abnormal LFTs. MRCP: 13 mm common hepatic duct, 6 mm CBD, no choledocholithiasis.  Thickened/inflamed proximal duodenum  Duodenitis/PUD.  Gallstones. Pancreatic cysts, no worrisome features.   No PPI/H2B, no ASA/NSAIDs at home.  Do not find previous EGD. Acut transaminitis resolved.  ? Passed CBD stone.   02/01/16 EGD: non-obstructing Schatski's ring, HH, ulcerated mucosa at D2 (? Ischemic injury?).  Biopsies for H pylori and of ulcer obtained, analysis pending.  Dr Rhea BeltonPyrtle outlined PPI BID for at least 2 months, then drop to once daily.     *  Chronic Plavix for hx strokes.  SQ Heparin in place.  Dr Rhea BeltonPyrtle suggests holding Plavix, restarting 1/17 due to risk of causing GI bleeding.     *  S/p 2010 subtotal colectomy for stricturing diverticulitis.  Adenomatous colon polyp 2000, 2002, 2005.  5  adenomatous polyps seen in 2010 resected colon. Flex Sig 05/2012: Hemorrhoidal banding at colorectal anastomosis, no polyps.      *  CKD.     PLAN   *  Await biopsy results, add course abx if H Pylori is positive.   From GI perspective, can go home. Will d/w Dr Rhea Belton if/when she needs office GI follow up.      Jennye Moccasin  02/02/2016, 11:21 AM Pager: [phone]

## 2016-02-02 NOTE — Telephone Encounter (Signed)
-----   Message from [name], MD sent at 02/02/2016  4:14 PM EST ----- Regarding: Hospital follow-up 4-6 weeks with me or APP  JMP

## 2016-02-03 LAB — BASIC METABOLIC PANEL
Anion gap: 5 (ref 5–15)
BUN: 29 mg/dL — ABNORMAL HIGH (ref 6–20)
CALCIUM: 8.5 mg/dL — AB (ref 8.9–10.3)
CHLORIDE: 110 mmol/L (ref 101–111)
CO2: 23 mmol/L (ref 22–32)
CREATININE: 1.83 mg/dL — AB (ref 0.44–1.00)
GFR calc non Af Amer: 25 mL/min — ABNORMAL LOW (ref >60)
GFR, EST AFRICAN AMERICAN: 29 mL/min — AB (ref >60)
GLUCOSE: 93 mg/dL (ref 65–99)
Potassium: 4.3 mmol/L (ref 3.5–5.1)
Sodium: 138 mmol/L (ref 135–145)

## 2016-02-03 LAB — GLUCOSE, CAPILLARY
GLUCOSE-CAPILLARY: 92 mg/dL (ref 65–99)
GLUCOSE-CAPILLARY: 128 mg/dL — AB (ref 65–99)
Glucose-Capillary: 98 mg/dL (ref 65–99)

## 2016-02-03 MED ORDER — LOSARTAN POTASSIUM 25 MG PO TABS: 25.0000 mg | ORAL_TABLET | Freq: Every day | ORAL | 0 refills | Status: DC

## 2016-02-03 MED ORDER — PANTOPRAZOLE SODIUM 40 MG PO TBEC: 40.0000 mg | DELAYED_RELEASE_TABLET | Freq: Two times a day (BID) | ORAL | 1 refills | Status: DC

## 2016-02-03 MED ORDER — CLOPIDOGREL BISULFATE 75 MG PO TABS: 75.0000 mg | ORAL_TABLET | Freq: Every day | ORAL | 0 refills | Status: DC

## 2016-02-03 MED ORDER — PANTOPRAZOLE SODIUM 40 MG PO TBEC: 40.0000 mg | DELAYED_RELEASE_TABLET | Freq: Two times a day (BID) | ORAL | 0 refills | Status: DC

## 2016-02-03 MED ORDER — FLUTICASONE PROPIONATE 50 MCG/ACT NA SUSP: 1.0000 | Freq: Every day | NASAL | 0 refills | Status: DC

## 2016-02-03 MED ORDER — FLUTICASONE PROPIONATE 50 MCG/ACT NA SUSP
1.0000 | Freq: Every day | NASAL | Status: DC
  Filled 2016-02-03: qty 16

## 2016-02-03 NOTE — Progress Notes (Signed)
Melrose NakayamaSarah M [place] to be D/C'd Home per MD order.  Discussed prescriptions and follow up appointments with the patient. Prescriptions given to patient, medication list explained in detail. Pt verbalized understanding.  Allergies as of 02/03/2016      Reactions   Aspirin Nausea And Vomiting   Hurts stomach      Medication List    STOP taking these medications   losartan-hydrochlorothiazide 50-12.5 MG tablet Commonly known as:  HYZAAR     TAKE these medications   acetaminophen 325 MG tablet Commonly known as:  TYLENOL Take 650 mg by mouth every 6 (six) hours as needed (for pain/fever).   atorvastatin 40 MG tablet Commonly known as:  LIPITOR Take 40 mg by mouth daily.   clopidogrel 75 MG tablet Commonly known as:  PLAVIX Take 1 tablet (75 mg total) by mouth daily. Hold for a week, Then restart. What changed:  additional instructions   fluticasone 50 MCG/ACT nasal spray Commonly known as:  FLONASE Place 1 spray into both nostrils daily.   hydrocortisone 25 MG suppository Commonly known as:  ANUSOL-HC Place 25 mg rectally 2 (two) times daily as needed for hemorrhoids.   loratadine 10 MG tablet Commonly known as:  CLARITIN Take 10 mg by mouth daily as needed for allergies.   losartan 25 MG tablet Commonly known as:  COZAAR Take 1 tablet (25 mg total) by mouth daily.   pantoprazole 40 MG tablet Commonly known as:  PROTONIX Take 1 tablet (40 mg total) by mouth 2 (two) times daily.   sennosides-docusate sodium 8.6-50 MG tablet Commonly known as:  SENOKOT-S Take 1 tablet by mouth daily as needed for constipation.   Vitamin D3 1000 units Caps Take 1,000 Units by mouth daily.       Vitals:   02/03/16 0604 02/03/16 0910  BP: (!) 125/57 (!) 119/57  Pulse: 70 75  Resp: 18 18  Temp: 99 F (37.2 C) 97.7 F (36.5 C)    Skin clean, dry and intact without evidence of skin break down, no evidence of skin tears noted. IV catheter discontinued intact. Site without signs and  symptoms of complications. Dressing and pressure applied. Pt denies pain at this time. No complaints noted.  An After Visit Summary was printed and given to the patient. Patient escorted via WC, and D/C home via private auto.  Nelma RothmanNatalie Jamin Panther, RN [facility] Phone [phone]

## 2016-02-03 NOTE — Care Management Important Message (Signed)
Important Message  Patient Details  Name: [name] MRN: [id number] Date of Birth: 04/09/1935   Medicare Important Message Given:  Yes    [name] 02/03/2016, 12:37 PM

## 2016-02-03 NOTE — Care Management Note (Signed)
Case Management Note  Patient Details  Name: [name] MRN: [id number] Date of Birth: [birth year]  Subjective/Objective:     CM following for progression and d/c planning.                Action/Plan: 02/03/2016 Met with pt on 02/02/16 re [name] needs. Pt is client of [facility], she goes to that facility 3 days per week and is home with a [facility] aide 2 days per week. [facility] was notified on 02/02/16 of plan to d/c and of d/c to home with daughter on 02/03/16.   Expected Discharge Date:    02/03/2016              Expected Discharge Plan:  [place]  In-House Referral:  NA  Discharge planning Services  CM Consult  Post Acute Care Choice:  Home Health Choice offered to:  Patient  DME Arranged:  N/A DME Agency:  NA  HH Arranged:  Nurse[facility] Aide [place] Agency:  Other - See comment (Pt is active with [facility])  Status of Service:  Completed, signed off  If discussed at [place] of Stay Meetings, dates discussed:    Additional Comments:  Adron Bene, RN 02/03/2016, 12:16 PM

## 2016-02-03 NOTE — Telephone Encounter (Signed)
Spoke to AdelDenise, (patient's daughter) to advise of upcoming appointment with Hyacinth MeekerJennifer Lemmon. Daughter states that we need to set it up any appointments with [facility] because they normally give her transportation. I advised that we do not typically set up anything with [facility] and that she would need to either let them know that patient needs appointment after hospitalization and let them schedule or contact them to let them know about current appointment. I advised that I could cancel appointment for now if needed. Daughter states that she will let [facility] know about appointment. I advised that if she has a contact number and name of who I would need to speak to, I would be glad to if there are any problems. She verbalizes understanding.

## 2016-02-03 NOTE — Discharge Summary (Signed)
Discharge Summary  [name] ZOX:[id number] DOB: [birth year]  PCP: [name], MD  Admit date: 01/29/2016 Discharge date: 02/03/2016  Time spent: <80mins  Recommendations for Outpatient Follow-up:  1. F/u with PMD within a week  for hospital discharge follow up, repeat cbc/bmp at follow up 2. F/u with GI for duodenal ulcer  Discharge Diagnoses:  Active Hospital Problems   Diagnosis Date Noted  . Choledocholithiasis 01/30/2016  . Multiple duodenal ulcers   . RLQ abdominal pain   . Gallstones   . Elevated liver enzymes   . Abnormal finding on GI tract imaging   . Pancreas cyst   . Acute kidney injury (HCC) 01/30/2016  . CKD (chronic kidney disease) stage 3, GFR 30-59 ml/min 12/01/2010  . History of CVA (cerebrovascular accident) 08/17/2008  . Hypertension 08/17/2008    Resolved Hospital Problems   Diagnosis Date Noted Date Resolved  No resolved problems to display.    Discharge Condition: stable  Diet recommendation: heart healthy  Filed Weights   01/30/16 0627 01/30/16 2122 02/01/16 2028  Weight: 61.4 kg (135 lb 5.8 oz) 61.3 kg (135 lb 2.3 oz) 61.3 kg (135 lb 2.3 oz)    History of present illness:  Patient coming from: Home  Chief Complaint: Abdominal pain, nausea, vomiting, loss of appetite  HPI: [name] is a 81 y.o. female with medical history significant for hypertension, chronic kidney disease stage III, history of CVA with residual dysarthria and hemiparesis, and dementia who presents to the emergency department with upper abdominal pain, nausea, vomiting, and loss of appetite of 3 days' duration. Patient was evaluated in the emergency department one week ago with very similar symptoms, but left prior to being evaluated by ED physician. His symptoms had resolved at that time and she felt well for several days before experiencing a recurrence in her symptoms approximately 3 days ago. She has had intermittent pain in the upper abdomen  described as severe, sharp, nonradiating, waxing and waning; associated with nausea, vomiting, and loss of appetite. She denies any fevers or chills and denies chest pain or palpitations. She is unable to identify any alleviating or exacerbating factors for her symptoms and has not tried any interventions at home prior to coming in.  ED Course: Upon arrival to the ED, patient is found to be afebrile, saturating well on room air, and with vital signs stable. EKG features a sinus rhythm with early R transition. Chest x-ray is negative for acute cardiopulmonary disease. Chemistry panels notable for a BUN of 52 and serum creatinine of 3.76, up from an apparent baseline of 1.9. Chemistries were also notable for an ALT of 79, improved from 1 week ago when ALT was 159 and AST was 394. CBC is unremarkable and troponin is undetectable. Urinalysis is notable for an increased specific gravity. Abdominal ultrasound was obtained and notable for cholelithiasis without acute cholecystitis, as well as slight increased dilation of common bile duct to 9 mm with a 5 mm stone within the proximal CBD. CT of the abdomen and pelvis was obtained and notable for a mild increase in dilation of the small bowel anastomosis with no definite obstruction or inflammation. Patient was treated with a liter of normal saline in the emergency department and gastroenterology was consulted by the ED physician. GI has advised a medical admission for likely ERCP in the morning. Patient became briefly tachycardic in the ED with a sinus tachycardia captured on EKG. She was asymptomatic with this and it has resolved with a  low dose Lopressor IVP. She will be admitted to the telemetry unit for ongoing evaluation and management of abdominal pain with nausea and vomiting suspected secondary to choledocholithiasis.   Hospital Course:  Principal Problem:   Choledocholithiasis Active Problems:   Hypertension   History of CVA (cerebrovascular accident)    CKD (chronic kidney disease) stage 3, GFR 30-59 ml/min   Acute kidney injury (HCC)   RLQ abdominal pain   Gallstones   Elevated liver enzymes   Abnormal finding on GI tract imaging   Pancreas cyst   Multiple duodenal ulcers   abdominal pain n/v, with lft elevation on presentation: Symptom likely from Choledocholithiasis/duodenal ulcer S/p mrcp, Strong suspicion of past CBD stone causing transient biliary obstruction Symptom resolved, lft normalized -  s/p upper endoscopy. Started pt on protonix,. Plan is to hold plavix for a week ,  -she tolerated diet advancement , no ab pain, no n/v at discharge, she is to follow GI  Acute kidney injury (HCC) on CKD (chronic kidney disease) stage 3, GFR 30-59 ml/min -cr 3.76 on presentation, ua no infection but is concentrated    - Most likely secondary to prerenal etiology given history of poor oral intake.  - improving with IVF rehydration and improvement in oral intake. - cr back to baseline hctz stopped , she is restarted on decreased dose of losartan, pmd to continue monitor renal function, adjust bp meds accordingly    Hypertension bp low normal in the hospital, home bp meds held D/c hctz, decrease losartan from 50mg  daily to 25mg  daily    History of CVA (cerebrovascular accident) (h/o right parietal occipital infarct on CT head in 05/2015) - baseline dysarthria, impaired coordination at baseline -plavix hold for a week per gi, continue statin  H/o PVD: plavix hold for a week, she is followed by vascular surgery   DVT prophylaxis: Heparin Code Status: Full Family Communication: daughter at bedside Disposition Plan: home with home health    Consultants:   Gastroenterology   Procedures: mrcp, egd  Antimicrobials: None   Discharge Exam: BP (!) 119/57 (BP Location: Right Arm)   Pulse 75   Temp 97.7 F (36.5 C) (Oral)   Resp 18   Ht 5\' 6"  (1.676 m)   Wt 61.3 kg (135 lb 2.3 oz)   SpO2 100%   BMI 21.81 kg/m    General: NAD, pleasant Cardiovascular: RRR Respiratory: CTABL Neuro: aaox3, dysarthria and poor coordination at baseline from prior stroke, left sided weakness, left hand chronic contracture, mild left foot drop  Discharge Instructions You were cared for by a hospitalist during your hospital stay. If you have any questions about your discharge medications or the care you received while you were in the hospital after you are discharged, you can call the unit and asked to speak with the hospitalist on call if the hospitalist that took care of you is not available. Once you are discharged, your primary care physician will handle any further medical issues. Please note that NO REFILLS for any discharge medications will be authorized once you are discharged, as it is imperative that you return to your primary care physician (or establish a relationship with a primary care physician if you do not have one) for your aftercare needs so that they can reassess your need for medications and monitor your lab values.  Discharge Instructions    Diet - low sodium heart healthy    Complete by:  As directed    Increase activity slowly  Complete by:  As directed      Allergies as of 02/03/2016      Reactions   Aspirin Nausea And Vomiting   Hurts stomach      Medication List    STOP taking these medications   losartan-hydrochlorothiazide 50-12.5 MG tablet Commonly known as:  HYZAAR     TAKE these medications   acetaminophen 325 MG tablet Commonly known as:  TYLENOL Take 650 mg by mouth every 6 (six) hours as needed (for pain/fever).   atorvastatin 40 MG tablet Commonly known as:  LIPITOR Take 40 mg by mouth daily.   clopidogrel 75 MG tablet Commonly known as:  PLAVIX Take 1 tablet (75 mg total) by mouth daily. Hold for a week, Then restart. What changed:  additional instructions   fluticasone 50 MCG/ACT nasal spray Commonly known as:  FLONASE Place 1 spray into both nostrils daily.    hydrocortisone 25 MG suppository Commonly known as:  ANUSOL-HC Place 25 mg rectally 2 (two) times daily as needed for hemorrhoids.   loratadine 10 MG tablet Commonly known as:  CLARITIN Take 10 mg by mouth daily as needed for allergies.   losartan 25 MG tablet Commonly known as:  COZAAR Take 1 tablet (25 mg total) by mouth daily.   pantoprazole 40 MG tablet Commonly known as:  PROTONIX Take 1 tablet (40 mg total) by mouth 2 (two) times daily.   sennosides-docusate sodium 8.6-50 MG tablet Commonly known as:  SENOKOT-S Take 1 tablet by mouth daily as needed for constipation.   Vitamin D3 1000 units Caps Take 1,000 Units by mouth daily.      Allergies  Allergen Reactions  . Aspirin Nausea And Vomiting    Hurts stomach   Follow-up Information    [name], MD Follow up in 1 week(s).   Specialty:  Family Medicine Why:  hospital discharge follow up, repeat cbc/bmp at follow up.  please monitor blood pressure at home and bring in blood pressure record to pmd, pmd to continue adjust blood pressure medication. Contact information: [address] [place] [address] [phone]        [facility] Follow up in 1 month(s).   Specialty:  Gastroenterology Why:  for severe duodenitis/ulcers  hold Plavix x 1 more week No NSAIDs start protonix bid for two months then daily Contact information: [address] [place] [address] [phone]           The results of significant diagnostics from this hospitalization (including imaging, microbiology, ancillary and laboratory) are listed below for reference.    Significant Diagnostic Studies: Ct Abdomen Pelvis Wo Contrast  Result Date: 01/30/2016 CLINICAL DATA:  81 y/o F; right lower quadrant abdominal pain. History colectomy, internal hemorrhoids, adenomatous colonic polyps, diverticulosis, diabetes, chronic kidney disease, and hysterectomy. EXAM: CT ABDOMEN AND PELVIS  WITHOUT CONTRAST TECHNIQUE: Multidetector CT imaging of the abdomen and pelvis was performed following the standard protocol without IV contrast. COMPARISON:  06/11/2009 CT abdomen and pelvis. 01/30/2016 abdominal ultrasound. FINDINGS: Lower chest: Segmental ground-glass opacities in the right lower lobe along the major fissure probably represent areas of atelectasis. Hepatobiliary: No focal liver abnormality. Gallstones. No intra or extrahepatic biliary ductal dilatation. Pancreas: Unremarkable. No pancreatic ductal dilatation or surrounding inflammatory changes. Spleen: Normal in size without focal abnormality. Adrenals/Urinary Tract: Ectopic malrotated left kidney. Right kidney upper pole punctate nonobstructing stone. No hydronephrosis. Collapsed bladder. Stomach/Bowel: No obstructive or inflammatory changes of the bowel are identified. Status post colectomy with mild  increase in dilatation of small bowel proximal to the rectum. Vascular/Lymphatic: Aortic atherosclerosis. No enlarged abdominal or pelvic lymph nodes. Reproductive: No abdominal wall hernia or abnormality. No abdominopelvic ascites. Other: Musculoskeletal: Interval progressive degenerative changes of the lumbar spine with increasing lower lumbar facet arthropathy and new grade 1 L4-5 degenerative anterolisthesis. No acute osseous abnormality is identified. Transitional L5 vertebral body with articulation of transverse processes with the sacral alae bilaterally. IMPRESSION: 1. Mild increase in dilatation of small bowel anastomosis proximal to the rectum of uncertain significance. No definite obstructive or inflammatory changes of bowel identified. 2. Right kidney upper pole nonobstructing stone. 3. Gallstones. 4. Ectopic malrotated left kidney, stable. 5. Aortic atherosclerosis. 6. Interval mild progression of lumbar spine degenerative changes. No acute osseous abnormality identified. Electronically Signed   By: Mitzi HansenLance  Furusawa-Stratton M.D.   On:  01/30/2016 03:47   Dg Chest 2 View  Result Date: 01/30/2016 CLINICAL DATA:  81 y/o F; nausea, vomiting, and loss of appetite for 3 days. EXAM: CHEST  2 VIEW COMPARISON:  01/20/2016 chest radiograph FINDINGS: Patient is rotated. The heart size and mediastinal contours are within normal limits and stable given projection and technique. Both lungs are clear. The visualized skeletal structures are unremarkable. Aortic atherosclerosis with calcification. IMPRESSION: No active cardiopulmonary disease.  Aortic atherosclerosis. Electronically Signed   By: Mitzi HansenLance  Furusawa-Stratton M.D.   On: 01/30/2016 01:38   Dg Chest 2 View  Result Date: 01/20/2016 CLINICAL DATA:  Abdominal pain radiating to the chest. EXAM: CHEST  2 VIEW COMPARISON:  Radiographs 12/28/2014 FINDINGS: The cardiomediastinal contours are normal. Mild atherosclerosis of the aortic arch. Pulmonary vasculature is normal. No consolidation, pleural effusion, or pneumothorax. No acute osseous abnormalities are seen. Chronic changes of both shoulders. IMPRESSION: 1. No active cardiopulmonary disease. 2. Thoracic aortic atherosclerosis. Electronically Signed   By: Rubye OaksMelanie  Ehinger M.D.   On: 01/20/2016 23:18   [place] Abdomen Complete  Result Date: 01/30/2016 CLINICAL DATA:  Elevated LFTs, upper abdominal pain and nausea vomiting EXAM: ABDOMEN ULTRASOUND COMPLETE COMPARISON:  CT 06/11/2009 FINDINGS: Gallbladder: Echogenic stones are present in the gallbladder. A round non mobile soft tissue echogenicity measuring 8 mm is also visualized and may represent a polyp. Normal wall thickness. No sonographic Murphy's. Common bile duct: Diameter: Slightly enlarged measuring 9 mm in maximum diameter. There is a small echogenic stone in the proximal common bile duct. Liver: No focal lesion identified. Within normal limits in parenchymal echogenicity. Mild intra hepatic biliary dilatation suggested. IVC: No abnormality visualized. Pancreas: Poorly evaluated due to bowel  gas Spleen: Size and appearance within normal limits. Right Kidney: Length: 9.5 cm. Echogenicity within normal limits. No mass or hydronephrosis visualized. Left Kidney: Length: 6.5 cm. Located in the low left abdomen. Slightly dysmorphic. No hydronephrosis Abdominal aorta: Obscured by bowel gas Other findings: None. IMPRESSION: 1. Gallstones and probable polyp in the gallbladder. No sonographic evidence for acute cholecystitis 2. Slightly enlarged common bowel duct up to 9 mm with mild intra hepatic biliary dilatation. 5 mm echogenic stone present in the proximal common bowel duct. 3. Ectopic left kidney located in the low left abdomen. Kidney is somewhat small and dysmorphic in appearance. 4. Pancreas and aorta are poorly evaluated due to bowel gas Electronically Signed   By: Jasmine PangKim  Fujinaga M.D.   On: 01/30/2016 02:29   Mr Abdomen Mrcp Wo Contrast  Result Date: 01/31/2016 CLINICAL DATA:  81 year old with upper abdominal pain, nausea, vomiting and elevated liver function studies. Cholelithiasis and mild biliary dilatation on ultrasound. EXAM:  MRI ABDOMEN WITHOUT CONTRAST  (INCLUDING MRCP) TECHNIQUE: Multiplanar multisequence MR imaging of the abdomen was performed. Heavily T2-weighted images of the biliary and pancreatic ducts were obtained, and three-dimensional MRCP images were rendered by post processing. COMPARISON:  Ultrasound and abdominal CT 01/30/2016. FINDINGS: Despite efforts by the technologist and patient, motion artifact is present on today's exam and could not be eliminated. This reduces exam sensitivity and specificity. Lower chest: The visualized lower chest appears unremarkable. No significant pleural effusion. Hepatobiliary: The liver appears unremarkable. Small gallstones are present. There is no gallbladder wall thickening. There is biliary dilatation. The common hepatic duct measures up to 13 mm in diameter. The common bile duct tapers distally, measuring 6 mm in diameter. No definite  choledocholithiasis. Pancreas: The pancreas is atrophied. There are several cystic lesions within the pancreatic head, demonstrating no definite communication with the main pancreatic duct. These measure up to 13 x 8 mm in diameter. There is no pancreatic ductal dilatation. Spleen: Normal in size without focal abnormality. Adrenals/Urinary Tract: Both adrenal glands appear normal. The right kidney appears unremarkable. The left kidney is located in the false left pelvis and is malrotated with mild cortical thinning. Appearance is similar to the recent CT. Stomach/Bowel: There is proximal duodenal wall thickening with surrounding fluid/edema. Duodenal diverticulum noted. As noted on recent CT, there is mild small bowel dilatation in the false pelvis status post total colectomy. This is incompletely visualized. Vascular/Lymphatic: There are no enlarged abdominal lymph nodes. Aortic and branch vessel atherosclerosis, better seen on CT. Other: As above, there is some retroperitoneal edema/ fluid around the duodenum in the right upper quadrant. No generalized ascites. Musculoskeletal: No acute or significant osseous findings. Lumbar spondylosis noted. IMPRESSION: 1. Extrahepatic biliary dilatation without definite signs of choledocholithiasis. 2. Cholelithiasis without gallbladder wall thickening. 3. Wall thickening of the proximal duodenum with surrounding edema/fluid. This suggests duodenitis or peptic ulcer disease. Correlate clinically. 4. Atrophied pancreas with multiple cystic lesions in the pancreatic head, demonstrating no aggressive features on noncontrast imaging. Follow-up MRI at 2 years recommended (preferably with and without contrast if possible). This recommendation follows ACR consensus guidelines: Management of Incidental Pancreatic Cysts: A White Paper of the ACR Incidental Findings Committee. J Am Coll Radiol 2017;14:911-923. Electronically Signed   By: Carey Bullocks M.D.   On: 01/31/2016 08:42    Mr 3d Recon At Scanner  Result Date: 01/31/2016 CLINICAL DATA:  81 year old with upper abdominal pain, nausea, vomiting and elevated liver function studies. Cholelithiasis and mild biliary dilatation on ultrasound. EXAM: MRI ABDOMEN WITHOUT CONTRAST  (INCLUDING MRCP) TECHNIQUE: Multiplanar multisequence MR imaging of the abdomen was performed. Heavily T2-weighted images of the biliary and pancreatic ducts were obtained, and three-dimensional MRCP images were rendered by post processing. COMPARISON:  Ultrasound and abdominal CT 01/30/2016. FINDINGS: Despite efforts by the technologist and patient, motion artifact is present on today's exam and could not be eliminated. This reduces exam sensitivity and specificity. Lower chest: The visualized lower chest appears unremarkable. No significant pleural effusion. Hepatobiliary: The liver appears unremarkable. Small gallstones are present. There is no gallbladder wall thickening. There is biliary dilatation. The common hepatic duct measures up to 13 mm in diameter. The common bile duct tapers distally, measuring 6 mm in diameter. No definite choledocholithiasis. Pancreas: The pancreas is atrophied. There are several cystic lesions within the pancreatic head, demonstrating no definite communication with the main pancreatic duct. These measure up to 13 x 8 mm in diameter. There is no pancreatic ductal dilatation. Spleen: Normal in  size without focal abnormality. Adrenals/Urinary Tract: Both adrenal glands appear normal. The right kidney appears unremarkable. The left kidney is located in the false left pelvis and is malrotated with mild cortical thinning. Appearance is similar to the recent CT. Stomach/Bowel: There is proximal duodenal wall thickening with surrounding fluid/edema. Duodenal diverticulum noted. As noted on recent CT, there is mild small bowel dilatation in the false pelvis status post total colectomy. This is incompletely visualized. Vascular/Lymphatic:  There are no enlarged abdominal lymph nodes. Aortic and branch vessel atherosclerosis, better seen on CT. Other: As above, there is some retroperitoneal edema/ fluid around the duodenum in the right upper quadrant. No generalized ascites. Musculoskeletal: No acute or significant osseous findings. Lumbar spondylosis noted. IMPRESSION: 1. Extrahepatic biliary dilatation without definite signs of choledocholithiasis. 2. Cholelithiasis without gallbladder wall thickening. 3. Wall thickening of the proximal duodenum with surrounding edema/fluid. This suggests duodenitis or peptic ulcer disease. Correlate clinically. 4. Atrophied pancreas with multiple cystic lesions in the pancreatic head, demonstrating no aggressive features on noncontrast imaging. Follow-up MRI at 2 years recommended (preferably with and without contrast if possible). This recommendation follows ACR consensus guidelines: Management of Incidental Pancreatic Cysts: A White Paper of the ACR Incidental Findings Committee. J Am Coll Radiol 2017;14:911-923. Electronically Signed   By: Carey Bullocks M.D.   On: 01/31/2016 08:42    Microbiology: No results found for this or any previous visit (from the past 240 hour(s)).   Labs: Basic Metabolic Panel:  Recent Labs Lab 01/30/16 0037 01/31/16 0355 02/01/16 0544 02/02/16 0557 02/03/16 0716  NA 135 135 137 138 138  K 4.5 3.5 3.7 4.0 4.3  CL 104 110 110 111 110  CO2 18* 18* 20* 22 23  GLUCOSE 115* 100* 102* 97 93  BUN 52* 40* 33* 32* 29*  CREATININE 3.76* 2.08* 1.84* 2.04* 1.83*  CALCIUM 9.4 8.1* 8.5* 8.4* 8.5*   Liver Function Tests:  Recent Labs Lab 01/30/16 0037 02/01/16 1236  AST 34 26  ALT 79* 38  ALKPHOS 105 78  BILITOT 0.6 0.5  PROT 7.3 5.7*  ALBUMIN 3.8 2.7*    Recent Labs Lab 01/30/16 0037  LIPASE 18   No results for input(s): AMMONIA in the last 168 hours. CBC:  Recent Labs Lab 01/30/16 0037  WBC 5.9  NEUTROABS 4.5  HGB 12.3  HCT 37.8  MCV 84.9  PLT  265   Cardiac Enzymes:  Recent Labs Lab 01/30/16 0037  TROPONINI <0.03   BNP: BNP (last 3 results) No results for input(s): BNP in the last 8760 hours.  ProBNP (last 3 results) No results for input(s): PROBNP in the last 8760 hours.  CBG:  Recent Labs Lab 02/02/16 0758 02/02/16 1158 02/02/16 1630 02/03/16 0216 02/03/16 0546  GLUCAP 102* 138* 157* 98 92       Signed:  Keontae Levingston MD, PhD  [facility] 02/03/2016, 10:38 AM

## 2016-02-04 LAB — H PYLORI, IGM, IGG, IGA AB: H PYLORI IGG: 1.2 {index_val} — AB (ref 0.0–0.7)

## 2016-02-25 ENCOUNTER — Encounter (HOSPITAL_COMMUNITY): Payer: Self-pay | Admitting: *Deleted

## 2016-03-07 ENCOUNTER — Ambulatory Visit (INDEPENDENT_AMBULATORY_CARE_PROVIDER_SITE_OTHER): Payer: Medicare (Managed Care) | Admitting: Physician Assistant

## 2016-03-07 ENCOUNTER — Encounter: Payer: Self-pay | Admitting: Physician Assistant

## 2016-03-07 VITALS — BP 124/50 | HR 76

## 2016-03-07 DIAGNOSIS — K269 Duodenal ulcer, unspecified as acute or chronic, without hemorrhage or perforation: Secondary | ICD-10-CM | POA: Diagnosis not present

## 2016-03-07 DIAGNOSIS — K219 Gastro-esophageal reflux disease without esophagitis: Secondary | ICD-10-CM

## 2016-03-07 MED ORDER — PANTOPRAZOLE SODIUM 40 MG PO TBEC
40.0000 mg | DELAYED_RELEASE_TABLET | Freq: Every day | ORAL | 11 refills | Status: DC
Start: 2016-03-07 — End: 2017-05-07

## 2016-03-07 NOTE — Patient Instructions (Signed)
Continue Pantoprazole 40 mg twice a day for one more month then decrease to daily.

## 2016-03-07 NOTE — Progress Notes (Addendum)
Chief Complaint: Follow up abdominal pain, nausea, vomiting and duodenal ulcerations  HPI:     [name] is a 81 year old African-American female with a past medical history significant for hypertension, chronic kidney disease stage III, history of CVA with residual dysarthria and hemiparesis, and dementia, who presents to clinic today alone for follow-up after recently being seen in the hospital for abdominal pain, nausea and vomiting with diagnosis of a duodenal ulcerations.   Patient was recently admitted to the hospital from 01/29/16-02/03/16 for upper abdominal pain, nausea, vomiting and loss of appetite for 3 days. Patient had an EGD with Dr. Rhea Belton on 02/01/16 while in the hospital for right upper quadrant pain and abnormal MRI of the GI tract which showed a nonobstructing Schatzki ring at the GI junction, 4 cm hiatal hernia, otherwise normal esophagus, normal mucosa in the entire stomach and ulcerated duodenal mucosa in the bulb and second portion of the duodenum, there was suspicion of an ischemic injury. Patient was told to take twice a day PPI for at least 8 weeks then once daily. She also had a finding of choledocholithiasis with strong suspicion of a passed CBD stone causing transient biliary obstruction, her symptoms resolved and her LFTs normalized.   Today, it should be noted that the patient has a history of CVA with residual dysarthria and hemiparesis and dementia and is unable to provide an accurate history. She does present to clinic alone. When asked the patient denies any abdominal pain, heartburn, reflux or change in bowel habits. She does have complaints of "scaling skin", bilateral edema and a dry cough. She denies any GI complaints.   Social history is positive for living with her daughter who is her sole caretaker.   Patient denies fever, chills, blood in her stool, melena, weight loss, anorexia, continued abdominal pain, nausea, vomiting or continued loss of appetite.   Past  Medical History:  Diagnosis Date  . Arthritis    hands  . Asthma   . Chronic kidney disease (CKD), stage III (moderate)   . Dementia   . Depression   . Diabetes mellitus without complication (HCC)   . Diverticulosis   . Hx of adenomatous colonic polyps   . Hyperlipidemia   . Hypertension   . Hypochromic anemia   . Internal hemorrhoids   . Stroke [facility]) May 2009   multiple, last one Nov 2012, 2 strokes with right sided deficits and slurred speech    Past Surgical History:  Procedure Laterality Date  . ABDOMINAL HYSTERECTOMY    . ABDOMINAL SURGERY    . COLECTOMY    . ESOPHAGOGASTRODUODENOSCOPY N/A 02/01/2016   Procedure: ESOPHAGOGASTRODUODENOSCOPY (EGD);  Surgeon: Beverley Fiedler, MD;  Location: [facility] ENDOSCOPY;  Service: Endoscopy;  Laterality: N/A;  . FLEXIBLE SIGMOIDOSCOPY N/A 05/28/2012   Procedure: FLEXIBLE SIGMOIDOSCOPY;  Surgeon: Hart Carwin, MD;  Location: [facility] ENDOSCOPY;  Service: Endoscopy;  Laterality: N/A;  . OTHER SURGICAL HISTORY     2 ear surgeries  . PERIPHERAL VASCULAR CATHETERIZATION N/A 07/21/2014   Procedure: Abdominal Aortogram w/Lower Extremity;  Surgeon: Nada Libman, MD;  Location: [facility] INVASIVE CV LAB;  Service: Cardiovascular;  Laterality: N/A;  . PERIPHERAL VASCULAR CATHETERIZATION N/A 11/23/2015   Procedure: Abdominal Aortogram w/Lower Extremity;  Surgeon: Nada Libman, MD;  Location: [facility] INVASIVE CV LAB;  Service: Cardiovascular;  Laterality: N/A;  . PERIPHERAL VASCULAR CATHETERIZATION  11/23/2015   Procedure: Peripheral Vascular Intervention;  Surgeon: Nada Libman, MD;  Location: [facility] INVASIVE CV LAB;  Service:  Cardiovascular;;  Failed PVI of AT and Peroneal on left side  . SUBTOTAL COLECTOMY  2010   for stricturing from bouts of diverticulitis along with hx recurrent diverticular bleeds.  the surgery was a total abdominal colec  . TONSILLECTOMY    . VASCULAR SURGERY      Current Outpatient Prescriptions  Medication Sig Dispense Refill  . acetaminophen  (TYLENOL) 325 MG tablet Take 650 mg by mouth every 6 (six) hours as needed (for pain/fever).     Marland Kitchen atorvastatin (LIPITOR) 40 MG tablet Take 40 mg by mouth daily.    . Cholecalciferol (VITAMIN D3) 1000 UNITS CAPS Take 1,000 Units by mouth daily.     . clopidogrel (PLAVIX) 75 MG tablet Take 1 tablet (75 mg total) by mouth daily. Hold for a week, Then restart. 30 tablet 0  . loratadine (CLARITIN) 10 MG tablet Take 10 mg by mouth daily as needed for allergies.     Marland Kitchen losartan (COZAAR) 25 MG tablet Take 1 tablet (25 mg total) by mouth daily. 30 tablet 0  . Nutritional Supplements (SUPLENA/CARB STEADY) LIQD Take 8 oz by mouth 3 (three) times daily.    . pantoprazole (PROTONIX) 40 MG tablet Take 1 tablet (40 mg total) by mouth 2 (two) times daily. 60 tablet 1  . zolpidem (AMBIEN) 5 MG tablet Take 5 mg by mouth at bedtime as needed for sleep.     No current facility-administered medications for this visit.     Allergies as of 03/07/2016 - Review Complete 03/07/2016  Allergen Reaction Noted  . Aspirin Nausea And Vomiting     Family History  Problem Relation Age of Onset  . Diabetes Paternal Aunt   . Stroke Father   . Heart disease Father   . Diabetes Father   . Depression Father   . HIV Brother   . Prostate cancer    . Cancer Sister     unknown type  . Colon cancer Neg Hx     Social History   Social History  . Marital status: Single    Spouse name: N/A  . Number of children: N/A  . Years of education: N/A   Occupational History  . Not on file.   Social History Main Topics  . Smoking status: Former Games developer  . Smokeless tobacco: Never Used  . Alcohol use No  . Drug use: No  . Sexual activity: No   Other Topics Concern  . Not on file   Social History Narrative  . No narrative on file    Review of Systems:    Constitutional: No weight loss, fever or chills Skin: Positive for dry peeling skin Cardiovascular: Positive for b/l pitting edema Respiratory: Positive for dry  cough Gastrointestinal: See HPI and otherwise negative Genitourinary: No dysuria or change in urinary frequency Neurological: No headache Musculoskeletal: No new muscle or joint pain Hematologic: No bleeding  Psychiatric: No history of depression or anxiety   Physical Exam:  Vital signs: BP (!) 124/50   Pulse 76    Constitutional:   Pleasantly Demented Elderly African American female appears to be in NAD, Well developed, Well nourished, alert and cooperative Respiratory: Respirations even and unlabored. Lungs clear to auscultation bilaterally.   No wheezes, crackles, or rhonchi.  Cardiovascular: Normal S1, S2. No MRG. Regular rate and rhythm. 2+ pitting edema b/l lower ext to level of shin Gastrointestinal:  Soft, nondistended, nontender. No rebound or guarding. Normal bowel sounds. No appreciable masses or hepatomegaly. Skin:  Dry scaling  skin on arms Psychiatric: Demented, Limited memory  MOST RECENT LABS: CBC    Component Value Date/Time   WBC 5.9 01/30/2016 0037   RBC 4.45 01/30/2016 0037   HGB 12.3 01/30/2016 0037   HCT 37.8 01/30/2016 0037   PLT 265 01/30/2016 0037   MCV 84.9 01/30/2016 0037   MCH 27.6 01/30/2016 0037   MCHC 32.5 01/30/2016 0037   RDW 14.5 01/30/2016 0037   LYMPHSABS 0.9 01/30/2016 0037   MONOABS 0.6 01/30/2016 0037   EOSABS 0.0 01/30/2016 0037   BASOSABS 0.0 01/30/2016 0037    CMP     Component Value Date/Time   NA 138 02/03/2016 0716   K 4.3 02/03/2016 0716   CL 110 02/03/2016 0716   CO2 23 02/03/2016 0716   GLUCOSE 93 02/03/2016 0716   BUN 29 (H) 02/03/2016 0716   CREATININE 1.83 (H) 02/03/2016 0716   CALCIUM 8.5 (L) 02/03/2016 0716   PROT 5.7 (L) 02/01/2016 1236   ALBUMIN 2.7 (L) 02/01/2016 1236   AST 26 02/01/2016 1236   ALT 38 02/01/2016 1236   ALKPHOS 78 02/01/2016 1236   BILITOT 0.5 02/01/2016 1236   GFRNONAA 25 (L) 02/03/2016 0716   GFRAA 29 (L) 02/03/2016 0716    Assessment: 1. Duodenal ulcerations: Patient diagnosed in  January during hospitalization, no further complaints of nausea, vomiting, abdominal pain or decrease in appetite on a twice a day PPI  Plan: 1. Continue Pantoprazole 40 mg twice a day, 30-60 minutes before breakfast and dinner for the next month, then decrease to once daily dosing. These instructions were written down on patient's discharge paperwork to give to her daughter 2. Recommend the patient discuss her dry skin, peripheral edema and dry cough with her PCP 3. Patient return to clinic as needed in the future.  Hyacinth MeekerJennifer Lianette Broussard, PA-C [facility] 03/07/2016, 1:41 PM  Cc: Jethro BastosKoehler, Robert N, MD   Addendum: Reviewed and agree with management. [name], MD

## 2016-04-30 ENCOUNTER — Inpatient Hospital Stay (HOSPITAL_COMMUNITY)
Admission: EM | Admit: 2016-04-30 | Discharge: 2016-05-01 | DRG: 689 | Disposition: A | Discharge status: Home / Self Care | Payer: Medicare (Managed Care) | Attending: Family Medicine | Admitting: Family Medicine

## 2016-04-30 ENCOUNTER — Emergency Department (HOSPITAL_COMMUNITY): Payer: Medicare (Managed Care)

## 2016-04-30 ENCOUNTER — Encounter (HOSPITAL_COMMUNITY): Payer: Self-pay | Admitting: Emergency Medicine

## 2016-04-30 DIAGNOSIS — F329 Major depressive disorder, single episode, unspecified: Secondary | ICD-10-CM | POA: Diagnosis present

## 2016-04-30 DIAGNOSIS — Z9049 Acquired absence of other specified parts of digestive tract: Secondary | ICD-10-CM

## 2016-04-30 DIAGNOSIS — Z79899 Other long term (current) drug therapy: Secondary | ICD-10-CM

## 2016-04-30 DIAGNOSIS — G9340 Encephalopathy, unspecified: Secondary | ICD-10-CM | POA: Diagnosis not present

## 2016-04-30 DIAGNOSIS — K5790 Diverticulosis of intestine, part unspecified, without perforation or abscess without bleeding: Secondary | ICD-10-CM | POA: Diagnosis present

## 2016-04-30 DIAGNOSIS — F0390 Unspecified dementia without behavioral disturbance: Secondary | ICD-10-CM | POA: Diagnosis present

## 2016-04-30 DIAGNOSIS — I130 Hypertensive heart and chronic kidney disease with heart failure and stage 1 through stage 4 chronic kidney disease, or unspecified chronic kidney disease: Secondary | ICD-10-CM | POA: Diagnosis present

## 2016-04-30 DIAGNOSIS — I5032 Chronic diastolic (congestive) heart failure: Secondary | ICD-10-CM | POA: Diagnosis not present

## 2016-04-30 DIAGNOSIS — I69328 Other speech and language deficits following cerebral infarction: Secondary | ICD-10-CM | POA: Diagnosis not present

## 2016-04-30 DIAGNOSIS — Z833 Family history of diabetes mellitus: Secondary | ICD-10-CM

## 2016-04-30 DIAGNOSIS — R918 Other nonspecific abnormal finding of lung field: Secondary | ICD-10-CM

## 2016-04-30 DIAGNOSIS — I10 Essential (primary) hypertension: Secondary | ICD-10-CM | POA: Diagnosis not present

## 2016-04-30 DIAGNOSIS — Y95 Nosocomial condition: Secondary | ICD-10-CM

## 2016-04-30 DIAGNOSIS — I639 Cerebral infarction, unspecified: Secondary | ICD-10-CM | POA: Diagnosis present

## 2016-04-30 DIAGNOSIS — K2981 Duodenitis with bleeding: Secondary | ICD-10-CM | POA: Diagnosis present

## 2016-04-30 DIAGNOSIS — M19041 Primary osteoarthritis, right hand: Secondary | ICD-10-CM | POA: Diagnosis present

## 2016-04-30 DIAGNOSIS — Z823 Family history of stroke: Secondary | ICD-10-CM

## 2016-04-30 DIAGNOSIS — K269 Duodenal ulcer, unspecified as acute or chronic, without hemorrhage or perforation: Secondary | ICD-10-CM | POA: Diagnosis present

## 2016-04-30 DIAGNOSIS — E785 Hyperlipidemia, unspecified: Secondary | ICD-10-CM | POA: Diagnosis present

## 2016-04-30 DIAGNOSIS — E872 Acidosis: Secondary | ICD-10-CM | POA: Diagnosis present

## 2016-04-30 DIAGNOSIS — K219 Gastro-esophageal reflux disease without esophagitis: Secondary | ICD-10-CM | POA: Diagnosis present

## 2016-04-30 DIAGNOSIS — M19042 Primary osteoarthritis, left hand: Secondary | ICD-10-CM | POA: Diagnosis present

## 2016-04-30 DIAGNOSIS — N183 Chronic kidney disease, stage 3 unspecified: Secondary | ICD-10-CM | POA: Diagnosis present

## 2016-04-30 DIAGNOSIS — E860 Dehydration: Secondary | ICD-10-CM | POA: Diagnosis present

## 2016-04-30 DIAGNOSIS — Z818 Family history of other mental and behavioral disorders: Secondary | ICD-10-CM

## 2016-04-30 DIAGNOSIS — Z886 Allergy status to analgesic agent status: Secondary | ICD-10-CM

## 2016-04-30 DIAGNOSIS — E1122 Type 2 diabetes mellitus with diabetic chronic kidney disease: Secondary | ICD-10-CM | POA: Diagnosis present

## 2016-04-30 DIAGNOSIS — Z7902 Long term (current) use of antithrombotics/antiplatelets: Secondary | ICD-10-CM | POA: Diagnosis not present

## 2016-04-30 DIAGNOSIS — I69331 Monoplegia of upper limb following cerebral infarction affecting right dominant side: Secondary | ICD-10-CM

## 2016-04-30 DIAGNOSIS — Z8249 Family history of ischemic heart disease and other diseases of the circulatory system: Secondary | ICD-10-CM

## 2016-04-30 DIAGNOSIS — N390 Urinary tract infection, site not specified: Secondary | ICD-10-CM | POA: Diagnosis not present

## 2016-04-30 DIAGNOSIS — Z8673 Personal history of transient ischemic attack (TIA), and cerebral infarction without residual deficits: Secondary | ICD-10-CM

## 2016-04-30 DIAGNOSIS — Z87891 Personal history of nicotine dependence: Secondary | ICD-10-CM

## 2016-04-30 DIAGNOSIS — Z86010 Personal history of colonic polyps: Secondary | ICD-10-CM | POA: Diagnosis not present

## 2016-04-30 DIAGNOSIS — J189 Pneumonia, unspecified organism: Secondary | ICD-10-CM

## 2016-04-30 DIAGNOSIS — J45909 Unspecified asthma, uncomplicated: Secondary | ICD-10-CM | POA: Diagnosis present

## 2016-04-30 DIAGNOSIS — E875 Hyperkalemia: Secondary | ICD-10-CM | POA: Diagnosis not present

## 2016-04-30 DIAGNOSIS — Z90710 Acquired absence of both cervix and uterus: Secondary | ICD-10-CM

## 2016-04-30 DIAGNOSIS — Z8042 Family history of malignant neoplasm of prostate: Secondary | ICD-10-CM

## 2016-04-30 DIAGNOSIS — Z830 Family history of human immunodeficiency virus [HIV] disease: Secondary | ICD-10-CM

## 2016-04-30 LAB — COMPREHENSIVE METABOLIC PANEL
ALBUMIN: 2.8 g/dL — AB (ref 3.5–5.0)
ALT: 11 U/L — AB (ref 14–54)
AST: 23 U/L (ref 15–41)
Alkaline Phosphatase: 63 U/L (ref 38–126)
Anion gap: 9 (ref 5–15)
BUN: 32 mg/dL — AB (ref 6–20)
CHLORIDE: 111 mmol/L (ref 101–111)
CO2: 18 mmol/L — ABNORMAL LOW (ref 22–32)
CREATININE: 1.74 mg/dL — AB (ref 0.44–1.00)
Calcium: 8.5 mg/dL — ABNORMAL LOW (ref 8.9–10.3)
GFR calc Af Amer: 31 mL/min — ABNORMAL LOW (ref >60)
GFR calc non Af Amer: 27 mL/min — ABNORMAL LOW (ref >60)
GLUCOSE: 97 mg/dL (ref 65–99)
POTASSIUM: 5.4 mmol/L — AB (ref 3.5–5.1)
Sodium: 138 mmol/L (ref 135–145)
TOTAL PROTEIN: 5.7 g/dL — AB (ref 6.5–8.1)

## 2016-04-30 LAB — I-STAT TROPONIN, ED: Troponin i, poc: 0 ng/mL (ref 0.00–0.08)

## 2016-04-30 LAB — CBC
HEMATOCRIT: 33.1 % — AB (ref 36.0–46.0)
Hemoglobin: 10.3 g/dL — ABNORMAL LOW (ref 12.0–15.0)
MCH: 27.2 pg (ref 26.0–34.0)
MCHC: 31.1 g/dL (ref 30.0–36.0)
MCV: 87.3 fL (ref 78.0–100.0)
Platelets: 168 10*3/uL (ref 150–400)
RBC: 3.79 MIL/uL — ABNORMAL LOW (ref 3.87–5.11)
RDW: 15.2 % (ref 11.5–15.5)
WBC: 5.6 10*3/uL (ref 4.0–10.5)

## 2016-04-30 LAB — CBG MONITORING, ED: Glucose-Capillary: 88 mg/dL (ref 65–99)

## 2016-04-30 LAB — I-STAT CG4 LACTIC ACID, ED: Lactic Acid, Venous: 0.74 mmol/L (ref 0.5–1.9)

## 2016-04-30 MED ORDER — SODIUM POLYSTYRENE SULFONATE 15 GM/60ML PO SUSP
30.0000 g | Freq: Once | ORAL | Status: AC
  Administered 2016-05-01: 30 g via ORAL
  Filled 2016-04-30: qty 120

## 2016-04-30 MED ORDER — LORATADINE 10 MG PO TABS
10.0000 mg | ORAL_TABLET | Freq: Every day | ORAL | Status: DC
  Administered 2016-05-01: 10 mg via ORAL
  Filled 2016-04-30: qty 1

## 2016-04-30 MED ORDER — SODIUM CHLORIDE 0.9% FLUSH
3.0000 mL | Freq: Two times a day (BID) | INTRAVENOUS | Status: DC
  Administered 2016-05-01 (×2): 3 mL via INTRAVENOUS

## 2016-04-30 MED ORDER — ATORVASTATIN CALCIUM 40 MG PO TABS
40.0000 mg | ORAL_TABLET | Freq: Every day | ORAL | Status: DC
  Administered 2016-05-01: 40 mg via ORAL
  Filled 2016-04-30: qty 1

## 2016-04-30 MED ORDER — LOSARTAN POTASSIUM 50 MG PO TABS
25.0000 mg | ORAL_TABLET | Freq: Every day | ORAL | Status: DC
  Administered 2016-05-01: 25 mg via ORAL
  Filled 2016-04-30: qty 1

## 2016-04-30 MED ORDER — PANTOPRAZOLE SODIUM 40 MG PO TBEC
40.0000 mg | DELAYED_RELEASE_TABLET | Freq: Every day | ORAL | Status: DC
  Administered 2016-05-01: 40 mg via ORAL
  Filled 2016-04-30: qty 1

## 2016-04-30 MED ORDER — ACETAMINOPHEN 650 MG RE SUPP: 650.0000 mg | Freq: Four times a day (QID) | RECTAL | Status: DC | PRN

## 2016-04-30 MED ORDER — ONDANSETRON HCL 4 MG/2ML IJ SOLN: 4.0000 mg | Freq: Four times a day (QID) | INTRAMUSCULAR | Status: DC | PRN

## 2016-04-30 MED ORDER — ACETAMINOPHEN 325 MG PO TABS: 650.0000 mg | ORAL_TABLET | Freq: Four times a day (QID) | ORAL | Status: DC | PRN

## 2016-04-30 MED ORDER — VANCOMYCIN HCL IN DEXTROSE 1-5 GM/200ML-% IV SOLN
1000.0000 mg | Freq: Once | INTRAVENOUS | Status: DC
  Filled 2016-04-30: qty 200

## 2016-04-30 MED ORDER — ENOXAPARIN SODIUM 30 MG/0.3ML ~~LOC~~ SOLN
30.0000 mg | SUBCUTANEOUS | Status: DC
  Administered 2016-05-01: 30 mg via SUBCUTANEOUS
  Filled 2016-04-30: qty 0.3

## 2016-04-30 MED ORDER — HYDRALAZINE HCL 20 MG/ML IJ SOLN
5.0000 mg | INTRAMUSCULAR | Status: DC | PRN
  Filled 2016-04-30: qty 1

## 2016-04-30 MED ORDER — PIPERACILLIN-TAZOBACTAM 3.375 G IVPB 30 MIN
3.3750 g | Freq: Once | INTRAVENOUS | Status: DC
  Filled 2016-04-30: qty 50

## 2016-04-30 MED ORDER — VITAMIN D 1000 UNITS PO TABS
1000.0000 [IU] | ORAL_TABLET | Freq: Every day | ORAL | Status: DC
  Administered 2016-05-01: 1000 [IU] via ORAL
  Filled 2016-04-30: qty 1

## 2016-04-30 MED ORDER — ONDANSETRON HCL 4 MG PO TABS: 4.0000 mg | ORAL_TABLET | Freq: Four times a day (QID) | ORAL | Status: DC | PRN

## 2016-04-30 MED ORDER — CLOPIDOGREL BISULFATE 75 MG PO TABS
75.0000 mg | ORAL_TABLET | Freq: Every day | ORAL | Status: DC
  Administered 2016-05-01: 75 mg via ORAL
  Filled 2016-04-30: qty 1

## 2016-04-30 MED ORDER — SODIUM CHLORIDE 0.9 % IV BOLUS (SEPSIS): 1000.0000 mL | Freq: Once | INTRAVENOUS | Status: DC

## 2016-04-30 NOTE — ED Provider Notes (Signed)
[facility]-EM[facility]Y DEPT Provider Note   CSN: [id number] Arrival date & time: 04/30/16  2039     History   Chief Complaint Chief Complaint  Patient presents with  . Fatigue  . Altered Mental Status    HPI [name] is a 81 y.o. female.  HPI 81 year old female with past medical history as below including history of stroke who presents with fatigue and altered mental status. According to report from the patient, although history is limited due to baseline dysarthria, the patient was recently at respite care for the last week. She returned home yesterday. She was with her daughter. Over the last 24 hours, she has had decreased by mouth intake and has been increasingly confused. The patient's daughter found her significantly more confused than baseline today and called EMS. Patient has reportedly had decreased by mouth intake and history of dehydration the past. Currently, she does endorse cough but states she feels much better after EMS gave her fluids. On EMS arrival, she is reportedly significantly hypertensive which resolved with 1 L en route. Currently, patient has no hypertension. She denies any chest pain. No abdominal pain, nausea, or vomiting.  Past Medical History:  Diagnosis Date  . Arthritis    hands  . Asthma   . Chronic kidney disease (CKD), stage III (moderate)   . Dementia   . Depression   . Diabetes mellitus without complication (HCC)   . Diverticulosis   . Hx of adenomatous colonic polyps   . Hyperlipidemia   . Hypertension   . Hypochromic anemia   . Internal hemorrhoids   . Stroke [facility]) May 2009   multiple, last one Nov 2012, 2 strokes with right sided deficits and slurred speech    Patient Active Problem List   Diagnosis Date Noted  . Stroke (cerebrum) (HCC) 04/30/2016  . Hyperkalemia 04/30/2016  . Acute encephalopathy 04/30/2016  . Chronic diastolic CHF (congestive heart failure) (HCC) 04/30/2016  . Multiple duodenal ulcers   . RLQ abdominal pain   .  Gallstones   . Elevated liver enzymes   . Abnormal finding on GI tract imaging   . Pancreas cyst   . Choledocholithiasis 01/30/2016  . Acute kidney injury (HCC) 01/30/2016  . Non-intractable vomiting with nausea   . Bleeding 11/23/2015  . Chronic kidney disease 05/29/2012  . Skin ulcer, stage 2 (HCC) 05/29/2012  . Internal hemorrhoid, bleeding 05/29/2012  . Severe protein-calorie malnutrition (HCC) 03/24/2012  . Hyponatremia 03/23/2012  . Hypokalemia 03/23/2012  . Urinary tract infection 03/23/2012  . Physical deconditioning 03/23/2012  . CKD (chronic kidney disease) stage 3, GFR 30-59 ml/min 12/01/2010  . Encephalopathy 12/01/2010  . TIA (transient ischemic attack) 11/28/2010  . ASTHMA 08/25/2008  . COLONIC POLYPS, ADENOMATOUS, HX OF 08/25/2008  . HLD (hyperlipidemia) 08/17/2008  . Microcytic hypochromic anemia 08/17/2008  . Hypertension 08/17/2008  . History of CVA (cerebrovascular accident) 08/17/2008  . DIVERTICULITIS, COLON 08/17/2008  . HYPERGLYCEMIA 08/17/2008    Past Surgical History:  Procedure Laterality Date  . ABDOMINAL HYSTERECTOMY    . ABDOMINAL SURGERY    . COLECTOMY    . ESOPHAGOGASTRODUODENOSCOPY N/A 02/01/2016   Procedure: ESOPHAGOGASTRODUODENOSCOPY (EGD);  Surgeon: Beverley Fiedler, MD;  Location: [facility] ENDOSCOPY;  Service: Endoscopy;  Laterality: N/A;  . FLEXIBLE SIGMOIDOSCOPY N/A 05/28/2012   Procedure: FLEXIBLE SIGMOIDOSCOPY;  Surgeon: Hart Carwin, MD;  Location: [facility] ENDOSCOPY;  Service: Endoscopy;  Laterality: N/A;  . OTHER SURGICAL HISTORY     2 ear surgeries  . PERIPHERAL VASCULAR CATHETERIZATION  N/A 07/21/2014   Procedure: Abdominal Aortogram w/Lower Extremity;  Surgeon: Nada Libman, MD;  [facility] INVASIVE CV LAB;  Service: Cardiovascular;  Laterality: N/A;  . PERIPHERAL VASCULAR CATHETERIZATION N/A 11/23/2015   Procedure: Abdominal Aortogram w/Lower Extremity;  Surgeon: Nada Libman, MD;  Location: [facility] INVASIVE CV LAB;  Service: Cardiovascular;   Laterality: N/A;  . PERIPHERAL VASCULAR CATHETERIZATION  11/23/2015   Procedure: Peripheral Vascular Intervention;  Surgeon: Nada Libman, MD;  [facility]: [facility] INVASIVE CV LAB;  Service: Cardiovascular;;  Failed PVI of AT and Peroneal on left side  . SUBTOTAL COLECTOMY  2010   for stricturing from bouts of diverticulitis along with hx recurrent diverticular bleeds.  the surgery was a total abdominal colec  . TONSILLECTOMY    . VASCULAR SURGERY      OB History    No data available       Home Medications    Prior to Admission medications   Medication Sig Start Date End Date Taking? Authorizing Provider  atorvastatin (LIPITOR) 40 MG tablet Take 40 mg by mouth daily.   Yes Historical Provider, MD  Cholecalciferol (VITAMIN D3) 1000 UNITS CAPS Take 1,000 Units by mouth daily.    Yes Historical Provider, MD  clopidogrel (PLAVIX) 75 MG tablet Take 1 tablet (75 mg total) by mouth daily. Hold for a week, Then restart. Patient taking differently: Take 75 mg by mouth daily.  02/03/16  Yes Albertine Grates, MD  loratadine (CLARITIN) 10 MG tablet Take 10 mg by mouth daily.    Yes Historical Provider, MD  losartan (COZAAR) 25 MG tablet Take 1 tablet (25 mg total) by mouth daily. 02/03/16  Yes Albertine Grates, MD  pantoprazole (PROTONIX) 40 MG tablet Take 1 tablet (40 mg total) by mouth daily. 03/07/16  Yes Unk Lightning, PA    Family History Family History  Problem Relation Age of Onset  . Diabetes Paternal Aunt   . Stroke Father   . Heart disease Father   . Diabetes Father   . Depression Father   . HIV Brother   . Prostate cancer    . Cancer Sister     unknown type  . Colon cancer Neg Hx     Social History Social History  Substance Use Topics  . Smoking status: Former Games developer  . Smokeless tobacco: Never Used  . Alcohol use No     Allergies   Aspirin   Review of Systems Review of Systems  Constitutional: Positive for fatigue. Negative for chills and fever.  HENT: Negative for  congestion, rhinorrhea and sore throat.   Eyes: Negative for visual disturbance.  Respiratory: Positive for cough. Negative for shortness of breath and wheezing.   Cardiovascular: Negative for chest pain and leg swelling.  Gastrointestinal: Negative for abdominal pain, diarrhea, nausea and vomiting.  Genitourinary: Negative for dysuria, flank pain, vaginal bleeding and vaginal discharge.  Musculoskeletal: Negative for neck pain.  Skin: Negative for rash.  Allergic/Immunologic: Negative for immunocompromised state.  Neurological: Positive for light-headedness. Negative for syncope and headaches.  Hematological: Does not bruise/bleed easily.  All other systems reviewed and are negative.    Physical Exam Updated Vital Signs BP (!) 165/60 (BP Location: Left Arm)   Pulse 76   Temp 98 F (36.7 C) (Oral)   Resp 17   Ht  (1.676 m)   Wt 133 lb 13.1 oz (60.7 kg)   SpO2 100%   BMI 21.60 kg/m   Physical Exam  Constitutional: She is oriented  to person, place, and time. She appears well-developed and well-nourished. No distress.  HENT:  Head: Normocephalic and atraumatic.  Eyes: Conjunctivae are normal.  Neck: Neck supple.  Cardiovascular: Normal rate, regular rhythm and normal heart sounds.  Exam reveals no friction rub.   No murmur heard. Pulmonary/Chest: Effort normal. No respiratory distress. She has decreased breath sounds in the right lower field and the left lower field. She has no wheezes. She has rhonchi in the right lower field and the left lower field. She has no rales.  Abdominal: She exhibits no distension.  Musculoskeletal: She exhibits no edema.  Neurological: She is alert and oriented to person, place, and time. She exhibits normal muscle tone.  Speech slurred though c/b lack of dentures. Face symmetric. EOMI. MAE with 5/5 strength. Normal sensation to light touch b/l UE and LE. AOx3.  Skin: Skin is warm. Capillary refill takes less than 2 seconds.  Psychiatric: She  has a normal mood and affect.  Nursing note and vitals reviewed.    ED Treatments / Results  Labs (all labs ordered are listed, but only abnormal results are displayed) Labs Reviewed  COMPREHENSIVE METABOLIC PANEL - Abnormal; Notable for the following:       Result Value   Potassium 5.4 (*)    CO2 18 (*)    BUN 32 (*)    Creatinine, Ser 1.74 (*)    Calcium 8.5 (*)    Total Protein 5.7 (*)    Albumin 2.8 (*)    ALT 11 (*)    Total Bilirubin <0.1 (*)    GFR calc non Af Amer 27 (*)    GFR calc Af Amer 31 (*)    All other components within normal limits  CBC - Abnormal; Notable for the following:    RBC 3.79 (*)    Hemoglobin 10.3 (*)    HCT 33.1 (*)    All other components within normal limits  URINALYSIS, ROUTINE W REFLEX MICROSCOPIC - Abnormal; Notable for the following:    APPearance HAZY (*)    Leukocytes, UA SMALL (*)    Bacteria, UA MANY (*)    All other components within normal limits  CULTURE, BLOOD (ROUTINE X 2)  CULTURE, BLOOD (ROUTINE X 2)  URINE CULTURE  BRAIN NATRIURETIC PEPTIDE  BASIC METABOLIC PANEL  CBC  CBG MONITORING, ED  I-STAT CG4 LACTIC ACID, ED  I-STAT TROPOININ, ED    EKG  EKG Interpretation  Date/Time:  Sunday April 30 2016 21:52:11 EDT Ventricular Rate:  78 PR Interval:    QRS Duration: 81 QT Interval:  410 QTC Calculation: 467 R Axis:   46 Text Interpretation:  Sinus rhythm Prolonged PR interval Abnormal R-wave progression, early transition Since last EKG, rate has decreased Non-specific ST changes have resolved Confirmed by Erma Heritage MD, Sheria Lang [phone]) on 04/30/2016 11:18:38 PM       Radiology Dg Chest 2 View  Result Date: 04/30/2016 CLINICAL DATA:  81 y/o  F; lethargy and diaphoresis. EXAM: CHEST  2 VIEW COMPARISON:  01/30/2016 chest radiograph FINDINGS: Stable normal cardiac silhouette. Left lower lobe patchy infiltrate. Pulmonary venous hypertension. Aortic atherosclerosis with calcification. No acute osseous abnormality is evident.  IMPRESSION: Left lower lobe patchy infiltrate suspicious for pneumonia. Pulmonary venous hypertension. These results will be called to the ordering clinician or representative by the Radiologist Assistant, and communication documented in the PACS or zVision Dashboard. Electronically Signed   By: Mitzi Hansen M.D.   On: 04/30/2016 22:57   Ct Head Wo  Contrast  Result Date: 05/01/2016 CLINICAL DATA:  81 y/o  F; acute encephalopathy. EXAM: CT HEAD WITHOUT CONTRAST TECHNIQUE: Contiguous axial images were obtained from the base of the skull through the vertex without intravenous contrast. COMPARISON:  05/25/2015 CT head FINDINGS: Brain: No evidence of acute infarction, hemorrhage, hydrocephalus, extra-axial collection or mass lesion/mass effect. Stable chronic cortical infarctions in the bilateral frontal lobes, left parietal lobe, and bilateral occipital lobes. Empty sella turcica incidentally noted. Stable small chronic lacunar infarct in the left caudate head. Vascular: Extensive calcific atherosclerosis of cavernous and paraclinoid internal carotid arteries. Skull: Normal. Negative for fracture or focal lesion. Sinuses/Orbits: No acute finding. Other: None. IMPRESSION: 1. No acute intracranial abnormality identified. 2. Stable chronic cortical infarcts in bilateral frontal lobes, left parietal lobe, and bilateral occipital lobes. Electronically Signed   By: Mitzi Hansen M.D.   On: 05/01/2016 01:15    Procedures Procedures (including critical care time)  Medications Ordered in ED Medications  pantoprazole (PROTONIX) EC tablet 40 mg (not administered)  clopidogrel (PLAVIX) tablet 75 mg (not administered)  atorvastatin (LIPITOR) tablet 40 mg (not administered)  cholecalciferol (VITAMIN D) tablet 1,000 Units (not administered)  losartan (COZAAR) tablet 25 mg (not administered)  loratadine (CLARITIN) tablet 10 mg (not administered)  hydrALAZINE (APRESOLINE) injection 5 mg (not  administered)  enoxaparin (LOVENOX) injection 30 mg (not administered)  sodium chloride flush (NS) 0.9 % injection 3 mL (3 mLs Intravenous Given 05/01/16 0039)  acetaminophen (TYLENOL) tablet 650 mg (not administered)    Or  acetaminophen (TYLENOL) suppository 650 mg (not administered)  ondansetron (ZOFRAN) tablet 4 mg (not administered)    Or  ondansetron (ZOFRAN) injection 4 mg (not administered)  cefTRIAXone (ROCEPHIN) 1 g in dextrose 5 % 50 mL IVPB (not administered)  sodium polystyrene (KAYEXALATE) 15 GM/60ML suspension 30 g (30 g Oral Given 05/01/16 0036)  cefTRIAXone (ROCEPHIN) 1 g in dextrose 5 % 50 mL IVPB (1 g Intravenous Transfusing/Transfer 05/01/16 0128)     Initial Impression / Assessment and Plan / ED Course  I have reviewed the triage vital signs and the nursing notes.  Pertinent labs & imaging results that were available during my care of the patient were reviewed by me and considered in my medical decision making (see chart for details).    81 yo F with PMHx above here with altered mental status and decreased PO intake. Labs show mild dehydration with acidemia, are o/w reassuring. CT head neg. CXR c/f PNA and pt does have course BS bilaterally. Concern for HCAP  Will start ABX, admit. Daughter updated and in agreement. Gentle fluids given.  Final Clinical Impressions(s) / ED Diagnoses   Final diagnoses:  Acute encephalopathy  HAP (hospital-acquired pneumonia)    New Prescriptions Current Discharge Medication List       Shaune Pollack, MD 05/01/16 1138

## 2016-04-30 NOTE — ED Notes (Signed)
Patient transported to X-ray 

## 2016-04-30 NOTE — H&P (Addendum)
History and Physical    [name]:[id number] DOB: 12/03/1935 DOA: 04/30/2016  Referring MD/NP/PA:   PCP: Emma Edu, MD   Patient coming from:  The patient is coming from home.  At baseline, pt is partially dependent for most of ADL.  Chief Complaint: AMS and cough  HPI: [name] is a 81 y.o. female with medical history significant of stroke (with slurred speech and right arm weakness at baseline), hypertension, hyperlipidemia, GERD, depression, dementia, duodenal ulcer, CKD-3, who presents with AMS and cough.  Patient has AMS baseline slurred speech, is a poor historian and is unable to provide accurate medical history, therefore, most of the history is obtained by discussing the case with ED physician, per EMS report, and with the nursing staff.  Per report, patient was recently at respite care for the last week. She returned home yesterday. She was with her daughter. Over the last 24 hours, she has had decreased by mouth intake and has been increasingly confused. The patient's daughter found her significantly more confused than baseline today and called EMS. Per EMS, she had hypotension with Bp 60/40, which improved to 159/57-->138/74 now after 300 cc of NS bolus.  Currently, she reports cough. No chest pain, fever or chills. No nausea, vomiting, diarrhea or abdominal pain noted. Patient has right arm weakness from previous stroke, but no new weakness in extremities, no facial droop, hearing loss or visual changes noted. No symptoms of UTI.  ED Course: pt was found to have WBC of 5.6, lactate 0.74, negative troponin, potassium 5.4, worsening renal function, temperature normal, no tachycardia, no tachypnea, oxygen sat 100% on room air. Chest x-ray showed a possible infiltration in the left lower lobe. Positive urinalysis with small amount of leukocyte and many bacteria, pending BNP. CT head negative. Pt is admitted to telemetry bed as inpatient.  Review of  Systems: Could not be reviewed accurately due to altered mental status.  Allergy:  Allergies  Allergen Reactions  . Aspirin Nausea And Vomiting    Hurts stomach    Past Medical History:  Diagnosis Date  . Arthritis    hands  . Asthma   . Chronic kidney disease (CKD), stage III (moderate)   . Dementia   . Depression   . Diabetes mellitus without complication (HCC)   . Diverticulosis   . Hx of adenomatous colonic polyps   . Hyperlipidemia   . Hypertension   . Hypochromic anemia   . Internal hemorrhoids   . Stroke [facility]) May 2009   multiple, last one Nov 2012, 2 strokes with right sided deficits and slurred speech    Past Surgical History:  Procedure Laterality Date  . ABDOMINAL HYSTERECTOMY    . ABDOMINAL SURGERY    . COLECTOMY    . ESOPHAGOGASTRODUODENOSCOPY N/A 02/01/2016   Procedure: ESOPHAGOGASTRODUODENOSCOPY (EGD);  Surgeon: Beverley Fiedler, MD;  Location: [facility] ENDOSCOPY;  Service: Endoscopy;  Laterality: N/A;  . FLEXIBLE SIGMOIDOSCOPY N/A 05/28/2012   Procedure: FLEXIBLE SIGMOIDOSCOPY;  Surgeon: Hart Carwin, MD;  Location: [facility] ENDOSCOPY;  Service: Endoscopy;  Laterality: N/A;  . OTHER SURGICAL HISTORY     2 ear surgeries  . PERIPHERAL VASCULAR CATHETERIZATION N/A 07/21/2014   Procedure: Abdominal Aortogram w/Lower Extremity;  Surgeon: Nada Libman, MD;  Location: [facility] INVASIVE CV LAB;  Service: Cardiovascular;  Laterality: N/A;  . PERIPHERAL VASCULAR CATHETERIZATION N/A 11/23/2015   Procedure: Abdominal Aortogram w/Lower Extremity;  Surgeon: Nada Libman, MD;  Location: [facility] INVASIVE CV LAB;  Service: Cardiovascular;  Laterality: N/A;  . PERIPHERAL VASCULAR CATHETERIZATION  11/23/2015   Procedure: Peripheral Vascular Intervention;  Surgeon: Nada Libman, MD;  Location: [facility] INVASIVE CV LAB;  Service: Cardiovascular;;  Failed PVI of AT and Peroneal on left side  . SUBTOTAL COLECTOMY  2010   for stricturing from bouts of diverticulitis along with hx recurrent diverticular  bleeds.  the surgery was a total abdominal colec  . TONSILLECTOMY    . VASCULAR SURGERY      Social History:  reports that she has quit smoking. She has never used smokeless tobacco. She reports that she does not drink alcohol or use drugs.  Family History:  Family History  Problem Relation Age of Onset  . Diabetes Paternal Aunt   . Stroke Father   . Heart disease Father   . Diabetes Father   . Depression Father   . HIV Brother   . Prostate cancer    . Cancer Sister     unknown type  . Colon cancer Neg Hx      Prior to Admission medications   Medication Sig Start Date End Date Taking? Authorizing Provider  atorvastatin (LIPITOR) 40 MG tablet Take 40 mg by mouth daily.   Yes Historical Provider, MD  Cholecalciferol (VITAMIN D3) 1000 UNITS CAPS Take 1,000 Units by mouth daily.    Yes Historical Provider, MD  clopidogrel (PLAVIX) 75 MG tablet Take 1 tablet (75 mg total) by mouth daily. Hold for a week, Then restart. Patient taking differently: Take 75 mg by mouth daily.  02/03/16  Yes Albertine Grates, MD  loratadine (CLARITIN) 10 MG tablet Take 10 mg by mouth daily.    Yes Historical Provider, MD  losartan (COZAAR) 25 MG tablet Take 1 tablet (25 mg total) by mouth daily. 02/03/16  Yes Albertine Grates, MD  pantoprazole (PROTONIX) 40 MG tablet Take 1 tablet (40 mg total) by mouth daily. 03/07/16  Yes Violet Baldy Stanley, [place]    Physical Exam: Vitals:   04/30/16 2130 04/30/16 2145 04/30/16 2200 04/30/16 2300  BP: (!) 121/56 135/60 138/74   Pulse: 78 77    Resp: Temp:      TempSrc:      SpO2: 100% 100%    Weight:    61.3 kg (135 lb 2.3 oz)  Height:     (1.676 m)   General: Not in acute distress HEENT:       Eyes: PERRL, EOMI, no scleral icterus.       ENT: No discharge from the ears and nose, no pharynx injection, no tonsillar enlargement.        Neck: positive JVD, no bruit, no mass felt. Heme: No neck lymph node enlargement. Cardiac: S1/S2, RRR, No murmurs, No gallops or  rubs. Respiratory: No rales, wheezing, rhonchi or rubs. GI: Soft, nondistended, nontender, no rebound pain, no organomegaly, BS present. GU: No hematuria Ext: 3+ pitting leg edema bilaterally. 2+DP/PT pulse bilaterally. Musculoskeletal: No joint deformities, No joint redness or warmth, no limitation of ROM in spin. Skin: No rashes.  Neuro: confused, oriented to place and knows her own name. has baseline slurred speech and right arm weakness.  Psych: Patient is not psychotic, no suicidal or hemocidal ideation.  Labs on Admission: I have personally reviewed following labs and imaging studies  CBC:  Recent Labs Lab 04/30/16 2100  WBC 5.6  HGB 10.3*  HCT 33.1*  MCV 87.3  PLT 168   Basic Metabolic Panel:  Recent Labs Lab 04/30/16  2100  NA 138  K 5.4*  CL 111  CO2 18*  GLUCOSE 97  BUN 32*  CREATININE 1.74*  CALCIUM 8.5*   GFR: Estimated Creatinine Clearance: 24.9 mL/min (A) (by C-G formula based on SCr of 1.74 mg/dL (H)). Liver Function Tests:  Recent Labs Lab 04/30/16 2100  AST 23  ALT 11*  ALKPHOS 63  BILITOT <0.1*  PROT 5.7*  ALBUMIN 2.8*   No results for input(s): LIPASE, AMYLASE in the last 168 hours. No results for input(s): AMMONIA in the last 168 hours. Coagulation Profile: No results for input(s): INR, PROTIME in the last 168 hours. Cardiac Enzymes: No results for input(s): CKTOTAL, CKMB, CKMBINDEX, TROPONINI in the last 168 hours. BNP (last 3 results) No results for input(s): PROBNP in the last 8760 hours. HbA1C: No results for input(s): HGBA1C in the last 72 hours. CBG:  Recent Labs Lab 04/30/16 2059  GLUCAP 88   Lipid Profile: No results for input(s): CHOL, HDL, LDLCALC, TRIG, CHOLHDL, LDLDIRECT in the last 72 hours. Thyroid Function Tests: No results for input(s): TSH, T4TOTAL, FREET4, T3FREE, THYROIDAB in the last 72 hours. Anemia Panel: No results for input(s): VITAMINB12, FOLATE, FERRITIN, TIBC, IRON, RETICCTPCT in the last 72  hours. Urine analysis:    Component Value Date/Time   COLORURINE YELLOW 01/30/2016 0115   APPEARANCEUR CLEAR 01/30/2016 0115   LABSPEC >1.030 (H) 01/30/2016 0115   PHURINE 5.0 01/30/2016 0115   GLUCOSEU NEGATIVE 01/30/2016 0115   HGBUR TRACE (A) 01/30/2016 0115   BILIRUBINUR NEGATIVE 01/30/2016 0115   KETONESUR NEGATIVE 01/30/2016 0115   PROTEINUR NEGATIVE 01/30/2016 0115   UROBILINOGEN 0.2 05/27/2012 1416   NITRITE NEGATIVE 01/30/2016 0115   LEUKOCYTESUR NEGATIVE 01/30/2016 0115   Sepsis Labs: (procalcitonin:4,lacticidven:4) )No results found for this or any previous visit (from the past 240 hour(s)).   Radiological Exams on Admission: Dg Chest 2 View  Result Date: 04/30/2016 CLINICAL DATA:  81 y/o  F; lethargy and diaphoresis. EXAM: CHEST  2 VIEW COMPARISON:  01/30/2016 chest radiograph FINDINGS: Stable normal cardiac silhouette. Left lower lobe patchy infiltrate. Pulmonary venous hypertension. Aortic atherosclerosis with calcification. No acute osseous abnormality is evident. IMPRESSION: Left lower lobe patchy infiltrate suspicious for pneumonia. Pulmonary venous hypertension. These results will be called to the ordering clinician or representative by the Radiologist Assistant, and communication documented in the PACS or zVision Dashboard. Electronically Signed   By: Mitzi Hansen M.D.   On: 04/30/2016 22:57     EKG: Independently reviewed.  Sinus rhythm, QTC 467, early R-wave progression, no ischemic change. No T-wave peaking.  Assessment/Plan Principal Problem:   Acute encephalopathy Active Problems:   HLD (hyperlipidemia)   Hypertension   History of CVA (cerebrovascular accident)   CKD (chronic kidney disease) stage 3, GFR 30-59 ml/min   Multiple duodenal ulcers   Stroke (cerebrum) (HCC)   Hyperkalemia   Chronic diastolic CHF (congestive heart failure) (HCC)   Acute encephalopathy: Etiology is not clear. CT head is negative for acute intracranial  abnormalities. Possibly due to UTI since patient has positive urinalysis with small amount of leukocytes and many bacteria, consistent with UTI. Patient is not septic. Patient reportedly had hypotension with blood pressure 60/40, which improved to 159/57 after 300 mL normal saline bolus. This is very questionable, the initial blood pressure measurement might be not accurate. Patient does not have new focal neurologic signs, low suspicions for new stroke.   -will admit to tele bed as inpt -IV rocephin.  -Bx and Ux -Frequent neuro check  HLD: -lipitor  Hx of stroke: has sequela of slurred speech and right arm weakness -Continue Plavix, Lipitor  HTN: -continue Cozarr -IV hydralazine when necessary  CKD-III: stable. Baseline creatinine 1.8-2.0. Her creatinine is 1.74 -Follow-up by BMP  Hx of Multiple duodenal ulcers: -protonic  Hyperkalemia: Potassium 5.4 without EKG change. -Kayexalate 30 g 1  Chronic diastolic CHF (congestive heart failure) (HCC): 2-D echo on 11/60/12 showed EF of 50-55 percent with grade 1 diastolic dysfunction. Patient has 2+ leg edema and JVD, indicating possible CHF exacerbation. Pt's cough is likely due to CHF exacerbation. Chest x-ray showed possible infiltration in left lower lobe, but patient does not have fever or leukocytosis, clinically patient does not have pneumonia. Her trop is negative.  -will give one dose of lasix, 40 mg x 1 by IV now -pending BNP, if BNP is significantly elevated, will escalate diuretics. -trop x 3  DVT ppx: SQ Lovenox Code Status: Full code Family Communication: None at bed side.   Disposition Plan:  Anticipate discharge back to previous home environment Consults called:  none Admission status:  Inpatient/tele      Date of Service 05/01/2016      Lorretta Harp [facility] Pager [phone]  If 7PM-7AM, please contact night-coverage [email or web address] Password [email or web address] 05/01/2016, 12:15 AM

## 2016-04-30 NOTE — ED Triage Notes (Signed)
Pt presents from home with [facility] for ALOC and lethargy and diaphoresis; EMS reports initial BP of 60/40; pt was given 300cc NS; pt denies n/v/d, urine symptoms, fever today; pt states she has hx of dehydration; pt also reports fall yesterday with bilat leg pain; pt alert and oriented on arrival

## 2016-05-01 ENCOUNTER — Inpatient Hospital Stay (HOSPITAL_COMMUNITY): Payer: Medicare (Managed Care)

## 2016-05-01 ENCOUNTER — Encounter (HOSPITAL_COMMUNITY): Payer: Self-pay | Admitting: Emergency Medicine

## 2016-05-01 DIAGNOSIS — I10 Essential (primary) hypertension: Secondary | ICD-10-CM

## 2016-05-01 DIAGNOSIS — G9340 Encephalopathy, unspecified: Secondary | ICD-10-CM

## 2016-05-01 DIAGNOSIS — N183 Chronic kidney disease, stage 3 (moderate): Secondary | ICD-10-CM

## 2016-05-01 DIAGNOSIS — I5032 Chronic diastolic (congestive) heart failure: Secondary | ICD-10-CM

## 2016-05-01 DIAGNOSIS — Z8673 Personal history of transient ischemic attack (TIA), and cerebral infarction without residual deficits: Secondary | ICD-10-CM

## 2016-05-01 DIAGNOSIS — E785 Hyperlipidemia, unspecified: Secondary | ICD-10-CM

## 2016-05-01 LAB — CBC
HCT: 32.2 % — ABNORMAL LOW (ref 36.0–46.0)
HEMOGLOBIN: 10.2 g/dL — AB (ref 12.0–15.0)
MCH: 27.2 pg (ref 26.0–34.0)
MCHC: 31.7 g/dL (ref 30.0–36.0)
MCV: 85.9 fL (ref 78.0–100.0)
PLATELETS: 185 10*3/uL (ref 150–400)
RBC: 3.75 MIL/uL — AB (ref 3.87–5.11)
RDW: 15.3 % (ref 11.5–15.5)
WBC: 6.2 10*3/uL (ref 4.0–10.5)

## 2016-05-01 LAB — BASIC METABOLIC PANEL
ANION GAP: 11 (ref 5–15)
BUN: 30 mg/dL — ABNORMAL HIGH (ref 6–20)
CHLORIDE: 109 mmol/L (ref 101–111)
CO2: 20 mmol/L — ABNORMAL LOW (ref 22–32)
CREATININE: 1.66 mg/dL — AB (ref 0.44–1.00)
Calcium: 9 mg/dL (ref 8.9–10.3)
GFR calc non Af Amer: 28 mL/min — ABNORMAL LOW (ref >60)
GFR, EST AFRICAN AMERICAN: 33 mL/min — AB (ref >60)
Glucose, Bld: 82 mg/dL (ref 65–99)
Potassium: 4.1 mmol/L (ref 3.5–5.1)
Sodium: 140 mmol/L (ref 135–145)

## 2016-05-01 LAB — GLUCOSE, CAPILLARY
GLUCOSE-CAPILLARY: 67 mg/dL (ref 65–99)
Glucose-Capillary: 109 mg/dL — ABNORMAL HIGH (ref 65–99)

## 2016-05-01 LAB — URINALYSIS, ROUTINE W REFLEX MICROSCOPIC
Bilirubin Urine: NEGATIVE
GLUCOSE, UA: NEGATIVE mg/dL
Hgb urine dipstick: NEGATIVE
KETONES UR: NEGATIVE mg/dL
Nitrite: NEGATIVE
PROTEIN: NEGATIVE mg/dL
SQUAMOUS EPITHELIAL / LPF: NONE SEEN
Specific Gravity, Urine: 1.016 (ref 1.005–1.030)
pH: 5 (ref 5.0–8.0)

## 2016-05-01 LAB — BRAIN NATRIURETIC PEPTIDE: B Natriuretic Peptide: 106.5 pg/mL — ABNORMAL HIGH (ref 0.0–100.0)

## 2016-05-01 LAB — TROPONIN I

## 2016-05-01 MED ORDER — CEFPODOXIME PROXETIL 200 MG PO TABS
100.0000 mg | ORAL_TABLET | Freq: Every day | ORAL | Status: DC
  Administered 2016-05-01: 100 mg via ORAL
  Filled 2016-05-01: qty 1

## 2016-05-01 MED ORDER — CEFPODOXIME PROXETIL 200 MG PO TABS: 100.0000 mg | ORAL_TABLET | Freq: Every day | ORAL | Status: DC

## 2016-05-01 MED ORDER — DEXTROSE 5 % IV SOLN
1.0000 g | Freq: Once | INTRAVENOUS | Status: AC
  Administered 2016-05-01: 1 g via INTRAVENOUS
  Filled 2016-05-01: qty 10

## 2016-05-01 MED ORDER — CEFPODOXIME PROXETIL 100 MG PO TABS: 100.0000 mg | ORAL_TABLET | Freq: Every day | ORAL | 0 refills | Status: AC

## 2016-05-01 MED ORDER — FUROSEMIDE 10 MG/ML IJ SOLN
40.0000 mg | Freq: Once | INTRAMUSCULAR | Status: AC
  Administered 2016-05-01: 40 mg via INTRAVENOUS
  Filled 2016-05-01: qty 4

## 2016-05-01 MED ORDER — DEXTROSE 50 % IV SOLN
50.0000 mL | Freq: Once | INTRAVENOUS | Status: AC
  Administered 2016-05-01: 50 mL via INTRAVENOUS
  Filled 2016-05-01: qty 50

## 2016-05-01 MED ORDER — CEFTRIAXONE SODIUM 1 G IJ SOLR: 1.0000 g | INTRAMUSCULAR | Status: DC

## 2016-05-01 NOTE — Progress Notes (Signed)
Received report from Mario, RN.

## 2016-05-01 NOTE — Progress Notes (Signed)
Patient passed stroke swallow screen in the ED. RN paged Niu,MD about patient's diet being changed. Niu,MD gave verbal order with readback for RN to place diet order of heart healthy/carb modified for patient. Will continue to monitor and treat per MD orders.

## 2016-05-01 NOTE — Progress Notes (Signed)
Patient was discharged home by MD order; discharged instructions  review and give to patient and her daughter with care notes and prescription; IV DIC; patient will be escorted to the car by nurse tech via wheelchair.  

## 2016-05-01 NOTE — Progress Notes (Signed)
Patient found to have CBG=69 this AM with no symptoms. She received Dextrose 50% - 50cc per protocol and her CHG went up to 109. MD made aware. Will continue to monitor.

## 2016-05-01 NOTE — Evaluation (Signed)
Physical Therapy Evaluation Patient Details Name: [name] MRN: [id number] DOB: 03/31/1935 Today's Date: 05/01/2016   History of Present Illness  Pt adm with acute encephalopathy possibly due to UTI. PMH - Lt CVA, dementia, ckd, htn.  Clinical Impression  Pt admitted with above diagnosis and presents to PT with functional limitations due to deficits listed below (See PT problem list). Pt needs skilled PT to maximize independence and safety to allow discharge to home with family and [facility]. Pt functionally close to where she was when seen in hospital in January. Assume she can continue with [facility] for further PT services.      Follow Up Recommendations Other (comment);Supervision/Assistance - 24 hour ([facility] for further PT and close to 24 hour assist)    Equipment Recommendations  None recommended by PT    Recommendations for Other Services       Precautions / Restrictions Precautions Precautions: Fall Restrictions Weight Bearing Restrictions: No      Mobility  Bed Mobility Overal bed mobility: Needs Assistance Bed Mobility: Supine to Sit     Supine to sit: Supervision;HOB elevated     General bed mobility comments: Took significant amount of time but didn't need physical assist  Transfers Overall transfer level: Needs assistance Equipment used: Rolling walker (2 wheeled) Transfers: Sit to/from Stand Sit to Stand: Mod assist         General transfer comment: Assist to bring hips up. Pt with significant posterior lean  Ambulation/Gait Ambulation/Gait assistance: Mod assist;Min assist Ambulation Distance (Feet): 150 Feet Assistive device: Rolling walker (2 wheeled) Gait Pattern/deviations: Step-through pattern;Decreased step length - right;Decreased step length - left;Shuffle;Festinating;Leaning posteriorly Gait velocity: decr Gait velocity interpretation: Below normal speed for age/gender General Gait Details: For first several steps pt needed mod A due to  posterior lean. After several steps posterior lean improved and pt only needed min A. Pt with stuttering steps with turns. Verbal cues to stay closer to the walker.  Stairs            Wheelchair Mobility    Modified Rankin (Stroke Patients Only)       Balance Overall balance assessment: Needs assistance Sitting-balance support: No upper extremity supported;Feet supported Sitting balance-Leahy Scale: Fair     Standing balance support: Bilateral upper extremity supported Standing balance-Leahy Scale: Poor Standing balance comment: walker and min A for static standing after initially getting her balance                             Pertinent Vitals/Pain Pain Assessment: No/denies pain    Home Living Family/patient expects to be discharged to:: Private residence Living Arrangements: Children Available Help at Discharge: Family;Available PRN/intermittently;Other (Comment) ([facility]) Type of Home: House Home Access: Stairs to enter     Home Layout: One level Home Equipment: Environmental consultant - 2 wheels;Shower seat;Grab bars - tub/shower Additional Comments: Information from prior encounter    Prior Function Level of Independence: Needs assistance   Gait / Transfers Assistance Needed: amb with rolling walker. Unsure if she is up on her own or with assistance           Hand Dominance   Dominant Hand: Right    Extremity/Trunk Assessment   Upper Extremity Assessment Upper Extremity Assessment: RUE deficits/detail RUE Deficits / Details: Mild residual weakness from CVA    Lower Extremity Assessment Lower Extremity Assessment: Generalized weakness       Communication   Communication: Expressive difficulties (very dysarthric)  Cognition Arousal/Alertness: Awake/alert Behavior During Therapy: WFL for tasks assessed/performed Overall Cognitive Status: No family/caregiver present to determine baseline cognitive functioning                                  General Comments: Pt able to communicate basic information.      General Comments      Exercises     Assessment/Plan    PT Assessment Patient needs continued PT services  PT Problem List Decreased strength;Decreased activity tolerance;Decreased balance;Decreased mobility;Decreased knowledge of use of DME       PT Treatment Interventions DME instruction;Gait training;Functional mobility training;Therapeutic activities;Therapeutic exercise;Balance training;Patient/family education    PT Goals (Current goals can be found in the Care Plan section)  Acute Rehab PT Goals Patient Stated Goal: go home PT Goal Formulation: With patient Time For Goal Achievement: 05/08/16 Potential to Achieve Goals: Good    Frequency Min 3X/week   Barriers to discharge        Co-evaluation               End of Session Equipment Utilized During Treatment: Gait belt Activity Tolerance: Patient tolerated treatment well Patient left: in chair;with call bell/phone within reach;with chair alarm set Nurse Communication: Mobility status (nurse and nurse tech) PT Visit Diagnosis: Unsteadiness on feet (R26.81);Other abnormalities of gait and mobility (R26.89)    Time: 4098-1191 PT Time Calculation (min) (ACUTE ONLY): 36 min   Charges:   PT Evaluation $PT Eval Moderate Complexity: 1 Procedure PT Treatments $Gait Training: 8-22 mins   PT G Codes:        [facility] PT [phone]   Angelina [facility] 05/01/2016, 3:15 PM

## 2016-05-01 NOTE — ED Notes (Signed)
Patient transported to CT 

## 2016-05-01 NOTE — Discharge Instructions (Signed)

## 2016-05-01 NOTE — ED Notes (Signed)
Attempted to call report

## 2016-05-01 NOTE — Progress Notes (Signed)
NURSING PROGRESS NOTE  Emma Hellickson CrawfordMRN: [id number] Admission Data: 05/01/16 at 0200 Attending Provider: Lorretta Harp, MD PCP: [name], MD Code status: Full  Allergies:  Allergies  Allergen Reactions  . Aspirin Nausea And Vomiting    Hurts stomach   Past Medical History:  Past Medical History:  Diagnosis Date  . Arthritis    hands  . Asthma   . Chronic kidney disease (CKD), stage III (moderate)   . Dementia   . Depression   . Diabetes mellitus without complication (HCC)   . Diverticulosis   . Hx of adenomatous colonic polyps   . Hyperlipidemia   . Hypertension   . Hypochromic anemia   . Internal hemorrhoids   . Stroke [facility]) May 2009   multiple, last one Nov 2012, 2 strokes with right sided deficits and slurred speech   Past Surgical History:  Past Surgical History:  Procedure Laterality Date  . ABDOMINAL HYSTERECTOMY    . ABDOMINAL SURGERY    . COLECTOMY    . ESOPHAGOGASTRODUODENOSCOPY N/A 02/01/2016   Procedure: ESOPHAGOGASTRODUODENOSCOPY (EGD);  Surgeon: Beverley Fiedler, MD;  Location: [facility] ENDOSCOPY;  Service: Endoscopy;  Laterality: N/A;  . FLEXIBLE SIGMOIDOSCOPY N/A 05/28/2012   Procedure: FLEXIBLE SIGMOIDOSCOPY;  Surgeon: Hart Carwin, MD;  Location: [facility] ENDOSCOPY;  Service: Endoscopy;  Laterality: N/A;  . OTHER SURGICAL HISTORY     2 ear surgeries  . PERIPHERAL VASCULAR CATHETERIZATION N/A 07/21/2014   Procedure: Abdominal Aortogram w/Lower Extremity;  Surgeon: Nada Libman, MD;  Location: [facility] INVASIVE CV LAB;  Service: Cardiovascular;  Laterality: N/A;  . PERIPHERAL VASCULAR CATHETERIZATION N/A 11/23/2015   Procedure: Abdominal Aortogram w/Lower Extremity;  Surgeon: Nada Libman, MD;  Location: [facility] INVASIVE CV LAB;  Service: Cardiovascular;  Laterality: N/A;  . PERIPHERAL VASCULAR CATHETERIZATION  11/23/2015   Procedure: Peripheral Vascular Intervention;  Surgeon: Nada Libman, MD;  Location: [facility] INVASIVE CV LAB;  Service: Cardiovascular;;   Failed PVI of AT and Peroneal on left side  . SUBTOTAL COLECTOMY  2010   for stricturing from bouts of diverticulitis along with hx recurrent diverticular bleeds.  the surgery was a total abdominal colec  . TONSILLECTOMY    . VASCULAR SURGERY     [name] is a 81 y.o. female patient, arrived to floor in room [phone] via stretcher, transferred from ED. Patient alert and oriented X 2. No acute distress noted. Denies pain.   Vital signs: Oral temperature 98 F (36.7 C), Blood pressure 165/60, Pulse 76, RR 17, SpO2 100 % on room air. Height 5'6", weight 133 lbs (60.7 kg).   Cardiac monitoring: Telemetry box 5W #38 in place. Second verified by Rogelia Mire, RN.  IV access: Right Hand-infusing; condition patent and no redness.  Skin: intact, no pressure ulcer noted in sacral area. Old healed wound on left great toe. Dry cracked skin on bilateral heels. Corn noted on medial upper side of right foot. Second verified by Rogelia Mire, RN.  Patient's ID armband verified with patient and in place. Information packet given to patient. Fall risk assessed, SR up X2, patient able to verbalize understanding of risks associated with falls and to call nurse or staff to assist before getting out of bed. Patient oriented to room and equipment. Call bell within reach.

## 2016-05-01 NOTE — Discharge Summary (Signed)
Physician Discharge Summary  SEEMA BLUM ZOX:[id number] DOB: 1935/11/06 DOA: 04/30/2016  PCP: [name], MD  Admit date: 04/30/2016 Discharge date: 05/01/2016  Admitted From: Home Disposition: Home  Recommendations for Outpatient Follow-up:  1. Follow up with [facility] 2. Continue antibiotics (7 day course) 3. Please follow up on the following pending results: Blood/urine culture  Home Health: [facility]  Discharge Condition: Stable CODE STATUS: Full code Diet recommendation: Heart healthy   Brief/Interim Summary:  Admission HPI written by Lorretta Harp, MD   Chief Complaint: AMS and cough  HPI: [name] is a 81 y.o. female with medical history significant of stroke (with slurred speech and right arm weakness at baseline), hypertension, hyperlipidemia, GERD, depression, dementia, duodenal ulcer, CKD-3, who presents with AMS and cough.  Patient has AMS baseline slurred speech, is a poor historian and is unable to provide accurate medical history, therefore, most of the history is obtained by discussing the case with ED physician, per EMS report, and with the nursing staff.  Per report, patient was recently at respite care for the last week. She returned home yesterday. She was with her daughter. Over the last 24 hours, she has had decreased by mouth intake and has been increasingly confused. The patient's daughter found her significantly more confused than baseline today and called EMS. Per EMS, she had hypotension with Bp 60/40, which improved to 159/57-->138/74 now after 300 cc of NS bolus.  Currently, she reports cough. No chest pain, fever or chills. No nausea, vomiting, diarrhea or abdominal pain noted. Patient has right arm weakness from previous stroke, but no new weakness in extremities, no facial droop, hearing loss or visual changes noted. No symptoms of UTI.  ED Course: pt was found to have WBC of 5.6, lactate 0.74, negative troponin, potassium 5.4, worsening  renal function, temperature normal, no tachycardia, no tachypnea, oxygen sat 100% on room air. Chest x-ray showed a possible infiltration in the left lower lobe. Positive urinalysis with small amount of leukocyte and many bacteria, pending BNP. CT head negative. Pt is admitted to telemetry bed as inpatient.    Hospital course:  Acute encephalopathy CT obtained at arrival which was negative for acute intracranial abnormalities. UTI suggested by urinalysis. Blood and urine cultures obtained. She was started on IV ceftriaxone. Mental status improved rapidly overnight.  Hyperlipidemia Continued Lipitor  History of stroke Continued Plavix and Lipitor  Essential hypertension Continued Cozaar  CKD stage III Stable  History of multiple duodenal ulcers Protonix  Hyperkalemia 5.4 on admission without EKG changes. Given kayexalate. Improved.  Chronic diastolic heart failure Echo in 2012 significant for grade 1 diastolic dysfunction and an EF of 50-55%  Discharge Diagnoses:  Principal Problem:   Acute encephalopathy Active Problems:   HLD (hyperlipidemia)   Hypertension   History of CVA (cerebrovascular accident)   CKD (chronic kidney disease) stage 3, GFR 30-59 ml/min   Multiple duodenal ulcers   Stroke (cerebrum) (HCC)   Hyperkalemia   Chronic diastolic CHF (congestive heart failure) (HCC)    Discharge Instructions   Allergies as of 05/01/2016      Reactions   Aspirin Nausea And Vomiting   Hurts stomach      Medication List    TAKE these medications   atorvastatin 40 MG tablet Commonly known as:  LIPITOR Take 40 mg by mouth daily.   cefpodoxime 100 MG tablet Commonly known as:  VANTIN Take 1 tablet (100 mg total) by mouth daily.   clopidogrel 75 MG tablet Commonly known  as:  PLAVIX Take 1 tablet (75 mg total) by mouth daily. Hold for a week, Then restart. What changed:  additional instructions   loratadine 10 MG tablet Commonly known as:  CLARITIN Take 10  mg by mouth daily.   losartan 25 MG tablet Commonly known as:  COZAAR Take 1 tablet (25 mg total) by mouth daily.   pantoprazole 40 MG tablet Commonly known as:  PROTONIX Take 1 tablet (40 mg total) by mouth daily.   Vitamin D3 1000 units Caps Take 1,000 Units by mouth daily.      Follow-up Information    [name], MD. Schedule an appointment as soon as possible for a visit in 1 week(s).   Specialty:  Family Medicine Contact information: [address] [place] [address] [phone]          Allergies  Allergen Reactions  . Aspirin Nausea And Vomiting    Hurts stomach    Consultations:  None   Procedures/Studies: Dg Chest 2 View  Result Date: 04/30/2016 CLINICAL DATA:  81 y/o  F; lethargy and diaphoresis. EXAM: CHEST  2 VIEW COMPARISON:  01/30/2016 chest radiograph FINDINGS: Stable normal cardiac silhouette. Left lower lobe patchy infiltrate. Pulmonary venous hypertension. Aortic atherosclerosis with calcification. No acute osseous abnormality is evident. IMPRESSION: Left lower lobe patchy infiltrate suspicious for pneumonia. Pulmonary venous hypertension. These results will be called to the ordering clinician or representative by the Radiologist Assistant, and communication documented in the PACS or zVision Dashboard. Electronically Signed   By: Mitzi Hansen M.D.   On: 04/30/2016 22:57   Ct Head Wo Contrast  Result Date: 05/01/2016 CLINICAL DATA:  81 y/o  F; acute encephalopathy. EXAM: CT HEAD WITHOUT CONTRAST TECHNIQUE: Contiguous axial images were obtained from the base of the skull through the vertex without intravenous contrast. COMPARISON:  05/25/2015 CT head FINDINGS: Brain: No evidence of acute infarction, hemorrhage, hydrocephalus, extra-axial collection or mass lesion/mass effect. Stable chronic cortical infarctions in the bilateral frontal lobes, left parietal lobe, and bilateral occipital lobes. Empty sella turcica incidentally  noted. Stable small chronic lacunar infarct in the left caudate head. Vascular: Extensive calcific atherosclerosis of cavernous and paraclinoid internal carotid arteries. Skull: Normal. Negative for fracture or focal lesion. Sinuses/Orbits: No acute finding. Other: None. IMPRESSION: 1. No acute intracranial abnormality identified. 2. Stable chronic cortical infarcts in bilateral frontal lobes, left parietal lobe, and bilateral occipital lobes. Electronically Signed   By: Mitzi Hansen M.D.   On: 05/01/2016 01:15   Dg Chest Port 1 View  Result Date: 05/01/2016 CLINICAL DATA:  Chronic nonproductive cough and chest tightness. History of stage III chronic renal insufficiency, CVA, and CHF. EXAM: PORTABLE CHEST 1 VIEW COMPARISON:  Chest x-ray of April 30, 2016 FINDINGS: The lungs are adequately inflated. There is no focal infiltrate. Left lower lobe density seen on the previous study is not clearly evident today. The heart and pulmonary vascularity are normal. There is calcification in the wall of the aortic arch. The bony thorax exhibits no acute abnormality. IMPRESSION: No pneumonia, CHF, nor other acute cardiopulmonary abnormality. Thoracic aortic atherosclerosis. Electronically Signed   By: [name]  [place] M.D.   On: 05/01/2016 10:39      Subjective: Patient reports no complaints today. No chest pain, dyspnea or dysuria.  Discharge Exam: Vitals:   05/01/16 0532 05/01/16 1526  BP: (!) 162/76 (!) 145/76  Pulse:  75  Resp:  20  Temp:  97.6 F (36.4 C)   Vitals:   05/01/16 0157 05/01/16  0506 05/01/16 0532 05/01/16 1526  BP: (!) 165/60 (!) 179/62 (!) 162/76 (!) 145/76  Pulse: 76 79  75  Resp: Temp: 98 F (36.7 C) 98.2 F (36.8 C)  97.6 F (36.4 C)  TempSrc: Oral Oral  Oral  SpO2: 100% 100%  100%  Weight: 60.7 kg (133 lb 13.1 oz) 60.3 kg (132 lb 15 oz)    Height:  (1.676 m)       General: Pt is alert, awake, not in acute distress Cardiovascular: RRR, S1/S2 +, no  rubs, no gallops Respiratory: CTA bilaterally, no wheezing, no rhonchi Abdominal: Soft, NT, ND, bowel sounds + Extremities: no edema, no cyanosis    The results of significant diagnostics from this hospitalization (including imaging, microbiology, ancillary and laboratory) are listed below for reference.     Labs: BNP (last 3 results)  Recent Labs  05/01/16 0515  BNP 106.5*   Basic Metabolic Panel:  Recent Labs Lab 04/30/16 2100 05/01/16 0515  NA 138 140  K 5.4* 4.1  CL 111 109  CO2 18* 20*  GLUCOSE 97 82  BUN 32* 30*  CREATININE 1.74* 1.66*  CALCIUM 8.5* 9.0   Liver Function Tests:  Recent Labs Lab 04/30/16 2100  AST 23  ALT 11*  ALKPHOS 63  BILITOT <0.1*  PROT 5.7*  ALBUMIN 2.8*   CBC:  Recent Labs Lab 04/30/16 2100 05/01/16 0515  WBC 5.6 6.2  HGB 10.3* 10.2*  HCT 33.1* 32.2*  MCV 87.3 85.9  PLT 168 185   Cardiac Enzymes:  Recent Labs Lab 05/01/16 0554  TROPONINI <0.03   BNP: Invalid input(s): POCBNP CBG:  Recent Labs Lab 04/30/16 2059 05/01/16 0759 05/01/16 0944  GLUCAP 88 67 109*   Urinalysis    Component Value Date/Time   COLORURINE YELLOW 04/30/2016 2354   APPEARANCEUR HAZY (A) 04/30/2016 2354   LABSPEC 1.016 04/30/2016 2354   PHURINE 5.0 04/30/2016 2354   GLUCOSEU NEGATIVE 04/30/2016 2354   HGBUR NEGATIVE 04/30/2016 2354   BILIRUBINUR NEGATIVE 04/30/2016 2354   KETONESUR NEGATIVE 04/30/2016 2354   PROTEINUR NEGATIVE 04/30/2016 2354   UROBILINOGEN 0.2 05/27/2012 1416   NITRITE NEGATIVE 04/30/2016 2354   LEUKOCYTESUR SMALL (A) 04/30/2016 2354     Time coordinating discharge: Over 30 minutes  SIGNED:   Jacquelin Hawking, MD [facility] 05/01/2016, 3:47 PM Pager [phone]  If 7PM-7AM, please contact night-coverage [email or web address] Password [email or web address]

## 2016-05-02 LAB — URINE CULTURE: Culture: >=100000 — AB

## 2016-05-05 LAB — CULTURE, BLOOD (ROUTINE X 2)
Culture: NO GROWTH
Culture: NO GROWTH
SPECIAL REQUESTS: ADEQUATE

## 2016-06-18 ENCOUNTER — Emergency Department (HOSPITAL_COMMUNITY)
Admission: EM | Admit: 2016-06-18 | Discharge: 2016-06-18 | Disposition: A | Discharge status: Home / Self Care | Payer: Medicare (Managed Care) | Attending: Emergency Medicine | Admitting: Emergency Medicine

## 2016-06-18 ENCOUNTER — Emergency Department (HOSPITAL_COMMUNITY): Payer: Medicare (Managed Care)

## 2016-06-18 ENCOUNTER — Encounter (HOSPITAL_COMMUNITY): Payer: Self-pay

## 2016-06-18 DIAGNOSIS — E1122 Type 2 diabetes mellitus with diabetic chronic kidney disease: Secondary | ICD-10-CM | POA: Diagnosis not present

## 2016-06-18 DIAGNOSIS — W19XXXA Unspecified fall, initial encounter: Secondary | ICD-10-CM

## 2016-06-18 DIAGNOSIS — Z8673 Personal history of transient ischemic attack (TIA), and cerebral infarction without residual deficits: Secondary | ICD-10-CM | POA: Insufficient documentation

## 2016-06-18 DIAGNOSIS — Z79899 Other long term (current) drug therapy: Secondary | ICD-10-CM | POA: Diagnosis not present

## 2016-06-18 DIAGNOSIS — Y9301 Activity, walking, marching and hiking: Secondary | ICD-10-CM | POA: Diagnosis not present

## 2016-06-18 DIAGNOSIS — Y929 Unspecified place or not applicable: Secondary | ICD-10-CM | POA: Insufficient documentation

## 2016-06-18 DIAGNOSIS — Z87891 Personal history of nicotine dependence: Secondary | ICD-10-CM | POA: Diagnosis not present

## 2016-06-18 DIAGNOSIS — I130 Hypertensive heart and chronic kidney disease with heart failure and stage 1 through stage 4 chronic kidney disease, or unspecified chronic kidney disease: Secondary | ICD-10-CM | POA: Diagnosis not present

## 2016-06-18 DIAGNOSIS — N183 Chronic kidney disease, stage 3 (moderate): Secondary | ICD-10-CM | POA: Diagnosis not present

## 2016-06-18 DIAGNOSIS — I5032 Chronic diastolic (congestive) heart failure: Secondary | ICD-10-CM | POA: Diagnosis not present

## 2016-06-18 DIAGNOSIS — J45909 Unspecified asthma, uncomplicated: Secondary | ICD-10-CM | POA: Diagnosis not present

## 2016-06-18 DIAGNOSIS — S8001XA Contusion of right knee, initial encounter: Secondary | ICD-10-CM | POA: Diagnosis not present

## 2016-06-18 DIAGNOSIS — S8991XA Unspecified injury of right lower leg, initial encounter: Secondary | ICD-10-CM | POA: Diagnosis present

## 2016-06-18 DIAGNOSIS — Y999 Unspecified external cause status: Secondary | ICD-10-CM | POA: Diagnosis not present

## 2016-06-18 DIAGNOSIS — W010XXA Fall on same level from slipping, tripping and stumbling without subsequent striking against object, initial encounter: Secondary | ICD-10-CM | POA: Insufficient documentation

## 2016-06-18 MED ORDER — HYDROCODONE-ACETAMINOPHEN 5-325 MG PO TABS
1.0000 | ORAL_TABLET | Freq: Once | ORAL | Status: AC
Start: 2016-06-18 — End: 2016-06-18
  Administered 2016-06-18: 1 via ORAL
  Filled 2016-06-18: qty 1

## 2016-06-18 NOTE — ED Notes (Signed)
Pt taking juice and crackers.

## 2016-06-18 NOTE — ED Notes (Signed)
ED Provider at bedside. 

## 2016-06-18 NOTE — ED Provider Notes (Signed)
[facility]-EMERGENCY DEPT Provider Note   CSN: [id number] Arrival date & time: 06/18/16  7829   By signing my name below, I, Freida Busman, attest that this documentation has been prepared under the direction and in the presence of Raeford Razor, MD . Electronically Signed: Freida Busman, Scribe. 06/18/2016. 10:29 AM.   History   Chief Complaint Chief Complaint  Patient presents with  . Fall   The history is provided by the patient. No language interpreter was used.     HPI Comments:  [name] is a 81 y.o. female who presents to the Emergency Department s/p fall today complaining of constant, right knee pain following the incident. Pt states she was walking with her cane when she lost her balabnce and fell. She reports increased pain when she attempts to stand. Pt denies head injury, LOC, back pain, and hip pain. No alleviating factors noted. She is currently on blood thinners.    Past Medical History:  Diagnosis Date  . Arthritis    hands  . Asthma   . Chronic kidney disease (CKD), stage III (moderate)   . Dementia   . Depression   . Diabetes mellitus without complication (HCC)   . Diverticulosis   . Hx of adenomatous colonic polyps   . Hyperlipidemia   . Hypertension   . Hypochromic anemia   . Internal hemorrhoids   . Stroke [facility]) May 2009   multiple, last one Nov 2012, 2 strokes with right sided deficits and slurred speech    Patient Active Problem List   Diagnosis Date Noted  . Stroke (cerebrum) (HCC) 04/30/2016  . Hyperkalemia 04/30/2016  . Acute encephalopathy 04/30/2016  . Chronic diastolic CHF (congestive heart failure) (HCC) 04/30/2016  . Multiple duodenal ulcers   . RLQ abdominal pain   . Gallstones   . Elevated liver enzymes   . Abnormal finding on GI tract imaging   . Pancreas cyst   . Choledocholithiasis 01/30/2016  . Acute kidney injury (HCC) 01/30/2016  . Non-intractable vomiting with nausea   . Bleeding 11/23/2015  . Chronic kidney disease  05/29/2012  . Skin ulcer, stage 2 (HCC) 05/29/2012  . Internal hemorrhoid, bleeding 05/29/2012  . Severe protein-calorie malnutrition (HCC) 03/24/2012  . Hyponatremia 03/23/2012  . Hypokalemia 03/23/2012  . Urinary tract infection 03/23/2012  . Physical deconditioning 03/23/2012  . CKD (chronic kidney disease) stage 3, GFR 30-59 ml/min 12/01/2010  . Encephalopathy 12/01/2010  . TIA (transient ischemic attack) 11/28/2010  . ASTHMA 08/25/2008  . COLONIC POLYPS, ADENOMATOUS, HX OF 08/25/2008  . HLD (hyperlipidemia) 08/17/2008  . Microcytic hypochromic anemia 08/17/2008  . Hypertension 08/17/2008  . History of CVA (cerebrovascular accident) 08/17/2008  . DIVERTICULITIS, COLON 08/17/2008  . HYPERGLYCEMIA 08/17/2008    Past Surgical History:  Procedure Laterality Date  . ABDOMINAL HYSTERECTOMY    . ABDOMINAL SURGERY    . COLECTOMY    . ESOPHAGOGASTRODUODENOSCOPY N/A 02/01/2016   Procedure: ESOPHAGOGASTRODUODENOSCOPY (EGD);  Surgeon: Beverley Fiedler, MD;  Location: [facility] ENDOSCOPY;  Service: Endoscopy;  Laterality: N/A;  . FLEXIBLE SIGMOIDOSCOPY N/A 05/28/2012   Procedure: FLEXIBLE SIGMOIDOSCOPY;  Surgeon: Hart Carwin, MD;  Location: [facility] ENDOSCOPY;  Service: Endoscopy;  Laterality: N/A;  . OTHER SURGICAL HISTORY     2 ear surgeries  . PERIPHERAL VASCULAR CATHETERIZATION N/A 07/21/2014   Procedure: Abdominal Aortogram w/Lower Extremity;  Surgeon: Nada Libman, MD;  Location: [facility] INVASIVE CV LAB;  Service: Cardiovascular;  Laterality: N/A;  . PERIPHERAL VASCULAR CATHETERIZATION N/A 11/23/2015   Procedure:  Abdominal Aortogram w/Lower Extremity;  Surgeon: Nada Libman, MD;  Location: [facility] INVASIVE CV LAB;  Service: Cardiovascular;  Laterality: N/A;  . PERIPHERAL VASCULAR CATHETERIZATION  11/23/2015   Procedure: Peripheral Vascular Intervention;  Surgeon: Nada Libman, MD;  Location: [facility] INVASIVE CV LAB;  Service: Cardiovascular;;  Failed PVI of AT and Peroneal on left side  . SUBTOTAL COLECTOMY   2010   for stricturing from bouts of diverticulitis along with hx recurrent diverticular bleeds.  the surgery was a total abdominal colec  . TONSILLECTOMY    . VASCULAR SURGERY      OB History    No data available       Home Medications    Prior to Admission medications   Medication Sig Start Date End Date Taking? Authorizing Provider  atorvastatin (LIPITOR) 40 MG tablet Take 40 mg by mouth daily.    [provider]  Cholecalciferol (VITAMIN D3) 1000 UNITS CAPS Take 1,000 Units by mouth daily.     [provider]  clopidogrel (PLAVIX) 75 MG tablet Take 1 tablet (75 mg total) by mouth daily. Hold for a week, Then restart. Patient taking differently: Take 75 mg by mouth daily.  02/03/16   Albertine Grates, MD  loratadine (CLARITIN) 10 MG tablet Take 10 mg by mouth daily.     [provider]  losartan (COZAAR) 25 MG tablet Take 1 tablet (25 mg total) by mouth daily. 02/03/16   Albertine Grates, MD  pantoprazole (PROTONIX) 40 MG tablet Take 1 tablet (40 mg total) by mouth daily. 03/07/16   Unk Lightning, PA    Family History Family History  Problem Relation Age of Onset  . Diabetes Paternal Aunt   . Stroke Father   . Heart disease Father   . Diabetes Father   . Depression Father   . HIV Brother   . Prostate cancer Unknown   . Cancer Sister        unknown type  . Colon cancer Neg Hx     Social History Social History  Substance Use Topics  . Smoking status: Former Games developer  . Smokeless tobacco: Never Used  . Alcohol use No     Allergies   Aspirin   Review of Systems Review of Systems  Musculoskeletal: Positive for arthralgias. Negative for back pain.  Neurological: Negative for syncope.  All other systems reviewed and are negative.    Physical Exam Updated Vital Signs BP (!) 176/66   Pulse 70   Temp 97.3 F (36.3 C)   Resp 18   SpO2 100%   Physical Exam  Constitutional: She is oriented to person, place, and time. She appears  well-developed and well-nourished. No distress.  HENT:  Head: Normocephalic and atraumatic.  No external signs of head trauma   Eyes: EOM are normal.  Neck: Normal range of motion.  Cardiovascular: Normal rate, regular rhythm and normal heart sounds.   Pulmonary/Chest: Effort normal and breath sounds normal.  Abdominal: Soft. She exhibits no distension. There is no tenderness.  Musculoskeletal:  Right knee is normal in appearance There is mild tenderness over the right patella; cannot actively range Symmetric LE edmea Soft tissue tenderness of right bicep  No midline spinal tenderness  Neurological: She is alert and oriented to person, place, and time.  Skin: Skin is warm and dry.  Psychiatric: She has a normal mood and affect. Judgment normal.  Nursing note and vitals reviewed.    ED Treatments / Results  DIAGNOSTIC STUDIES:  Oxygen Saturation is 100% on RA, normal by my interpretation.    COORDINATION OF CARE:  10:21 AM Discussed treatment plan with pt at bedside and pt agreed to plan.  Labs (all labs ordered are listed, but only abnormal results are displayed) Labs Reviewed - No data to display  EKG  EKG Interpretation None       Radiology No results found.  Dg Knee Complete 4 Views Right  Result Date: 06/18/2016 CLINICAL DATA:  81 year old female with a history of fall EXAM: RIGHT KNEE - COMPLETE 4+ VIEW COMPARISON:  None. FINDINGS: Osteopenia. No displaced fracture. No focal soft tissue swelling. No joint effusion. No radiopaque foreign body. Femoral popliteal calcifications. Tibial artery calcifications. IMPRESSION: Negative for acute bony abnormality. Osteopenia. Atherosclerotic calcifications Electronically Signed   By: Gilmer MorJaime  Wagner D.O.   On: 06/18/2016 11:16    Procedures Procedures (including critical care time)  Medications Ordered in ED Medications  HYDROcodone-acetaminophen (NORCO/VICODIN) 5-325 MG per tablet 1 tablet (1 tablet Oral Given 06/18/16  1116)     Initial Impression / Assessment and Plan / ED Course  I have reviewed the triage vital signs and the nursing notes.  Pertinent labs & imaging results that were available during my care of the patient were reviewed by me and considered in my medical decision making (see chart for details).     Mechanical fall. No acute neuro complaints. Negative imaging.   Final Clinical Impressions(s) / ED Diagnoses   Final diagnoses:  Fall, initial encounter  Contusion of right knee, initial encounter    New Prescriptions New Prescriptions   No medications on file   I personally preformed the services scribed in my presence. The recorded information has been reviewed is accurate. Raeford RazorStephen Nobie Alleyne, MD.     Raeford RazorKohut, Xyla Leisner, MD 06/25/16 [id number]

## 2016-06-18 NOTE — ED Notes (Signed)
Pt and daughter statwe they understands instructions. Home stable via wc.

## 2016-06-18 NOTE — ED Notes (Signed)
Pt clothed and dc instructions discussed. Moved to hall to await daughter.

## 2016-06-18 NOTE — ED Notes (Signed)
Pt taken off bedpan after voids 300 cc.

## 2016-06-18 NOTE — ED Notes (Signed)
Patient transported to X-ray 

## 2016-06-18 NOTE — ED Notes (Signed)
Pt daughter contacted. Aware that pt is being discharged.

## 2016-06-18 NOTE — ED Triage Notes (Addendum)
Pt arrives EMS from home where she tripped and fell today landing on right shoulder, right knee. Pt was ambulatory after fall. Hx of CVA and on blood thinners.

## 2016-07-03 ENCOUNTER — Encounter: Payer: Self-pay | Admitting: Surgery

## 2016-07-10 ENCOUNTER — Ambulatory Visit (INDEPENDENT_AMBULATORY_CARE_PROVIDER_SITE_OTHER): Payer: Medicare (Managed Care) | Admitting: Surgery

## 2016-07-10 ENCOUNTER — Ambulatory Visit (HOSPITAL_COMMUNITY)
Admission: RE | Admit: 2016-07-10 | Discharge: 2016-07-10 | Disposition: A | Discharge status: Home / Self Care | Payer: Medicare (Managed Care) | Source: Ambulatory Visit | Attending: Surgery | Admitting: Surgery

## 2016-07-10 ENCOUNTER — Encounter: Payer: Self-pay | Admitting: Surgery

## 2016-07-10 ENCOUNTER — Ambulatory Visit (INDEPENDENT_AMBULATORY_CARE_PROVIDER_SITE_OTHER)
Admission: RE | Admit: 2016-07-10 | Discharge: 2016-07-10 | Disposition: A | Discharge status: Home / Self Care | Payer: Medicare (Managed Care) | Source: Ambulatory Visit | Attending: Surgery | Admitting: Surgery

## 2016-07-10 VITALS — BP 141/68 | HR 66 | Temp 96.8°F | Resp 16 | Ht 66.0 in | Wt 128.0 lb

## 2016-07-10 DIAGNOSIS — I70212 Atherosclerosis of native arteries of extremities with intermittent claudication, left leg: Secondary | ICD-10-CM | POA: Diagnosis not present

## 2016-07-10 DIAGNOSIS — I739 Peripheral vascular disease, unspecified: Secondary | ICD-10-CM

## 2016-07-10 DIAGNOSIS — I771 Stricture of artery: Secondary | ICD-10-CM | POA: Diagnosis not present

## 2016-07-10 LAB — VAS US LOWER EXTREMITY ARTERIAL DUPLEX
LSFMPSV: -86 cm/s
LSFPPSV: -76 cm/s
Left ant tibial distal sys: -17 cm/s
Left super femoral dist sys PSV: -66 cm/s
RATIBDISTSYS: 58 cm/s
RSFDPSV: -83 cm/s
RTIBDISTSYS: 45 cm/s
Right super femoral mid sys PSV: -366 cm/s
Right super femoral prox sys PSV: 109 cm/s
left post tibial dist sys: -52 cm/s

## 2016-07-10 NOTE — Progress Notes (Signed)
[facility]  Patient name: Emma NakayamaSarah M Serpa MRN: [id number] DOB: [birth year] Sex: female   REASON FOR VISIT:    Follow up  HISOTRY OF PRESENT ILLNESS:    The patient is here today for follow-up. She initially presented with a left lateral malleolar ulcer. Arterial evaluation revealed diminished blood flow. On 07/21/2014 she underwent angiography. A 75% stenosis was identified within the left superficial femoral artery. She underwent atherectomy and drug coated balloon angioplasty, followed by stenting. Her procedure was uncomplicated  She developed another painful left lateral malleolus area with a scab over top.She went for angiography on 11/23/2015.  She had a short focal occlusion at the origin of the peroneal artery with multiple collaterals.  I was unable to recanalize this.  No further attempts at percutaneous revascularization of the left leg was recommended.  The patient's here today without complaints and she does not have any open wounds.  She does not have any pain in her feet although she does have some swelling for which she wears    She had multiple strokes, documented in 2009 and 2012, pt's speech is difficult to understand, and she indicates that she has mild left side weakness.   She has a walkerwith her, does not seem to walk much to elicit claudication sx's.  PAST MEDICAL HISTORY:   Past Medical History:  Diagnosis Date  . Arthritis    hands  . Asthma   . Chronic kidney disease (CKD), stage III (moderate)   . Dementia   . Depression   . Diabetes mellitus without complication (HCC)   . Diverticulosis   . Hx of adenomatous colonic polyps   . Hyperlipidemia   . Hypertension   . Hypochromic anemia   . Internal hemorrhoids   . Stroke [facility](HCC) May 2009   multiple, last one Nov 2012, 2 strokes with right sided deficits and slurred speech     FAMILY HISTORY:   Family History  Problem  Relation Age of Onset  . Diabetes Paternal Aunt   . Stroke Father   . Heart disease Father   . Diabetes Father   . Depression Father   . HIV Brother   . Prostate cancer Unknown   . Cancer Sister        unknown type  . Colon cancer Neg Hx     SOCIAL HISTORY:   Social History  Substance Use Topics  . Smoking status: Former Games developermoker  . Smokeless tobacco: Never Used  . Alcohol use No     ALLERGIES:   Allergies  Allergen Reactions  . Aspirin Nausea And Vomiting    Hurts stomach     CURRENT MEDICATIONS:   Current Outpatient Prescriptions  Medication Sig Dispense Refill  . atorvastatin (LIPITOR) 40 MG tablet Take 40 mg by mouth daily.    . Cholecalciferol (VITAMIN D3) 1000 UNITS CAPS Take 1,000 Units by mouth daily.     . clopidogrel (PLAVIX) 75 MG tablet Take 1 tablet (75 mg total) by mouth daily. Hold for a week, Then restart. (Patient taking differently: Take 75 mg by mouth daily. ) 30 tablet 0  . loratadine (CLARITIN) 10 MG tablet Take 10 mg by mouth daily.     Marland Kitchen. losartan (COZAAR) 25 MG tablet Take 1 tablet (25 mg total) by mouth daily. 30 tablet 0  . pantoprazole (PROTONIX) 40 MG tablet Take 1 tablet (40 mg total) by mouth daily. 30 tablet 11   No current facility-administered medications for this visit.  REVIEW OF SYSTEMS:   [X]  denotes positive finding, [ ]  denotes negative finding Cardiac  Comments:  Chest pain or chest pressure:    Shortness of breath upon exertion:    Short of breath when lying flat:    Irregular heart rhythm:        Vascular    Pain in calf, thigh, or hip brought on by ambulation:    Pain in feet at night that wakes you up from your sleep:     Blood clot in your veins:    Leg swelling:         Pulmonary    Oxygen at home:    Productive cough:     Wheezing:         Neurologic    Sudden weakness in arms or legs:     Sudden numbness in arms or legs:     Sudden onset of difficulty speaking or slurred speech:    Temporary loss of  vision in one eye:     Problems with dizziness:         Gastrointestinal    Blood in stool:     Vomited blood:         Genitourinary    Burning when urinating:     Blood in urine:        Psychiatric    Major depression:         Hematologic    Bleeding problems:    Problems with blood clotting too easily:        Skin    Rashes or ulcers:        Constitutional    Fever or chills:      PHYSICAL EXAM:   Vitals:   07/10/16 1148  BP: (!) 141/68  Pulse: 66  Resp: 16  Temp: (!) 96.8 F (36 C)  TempSrc: Oral  SpO2: 100%  Weight: 128 lb (58.1 kg)  Height: 5\' 6"  (1.676 m)    GENERAL: The patient is a well-nourished female, in no acute distress. The vital signs are documented above. CARDIAC: There is a regular rate and rhythm.  VASCULAR: Bilateral edema on the dorsum of the feet PULMONARY: Non-labored respirations MUSCULOSKELETAL: There are no major deformities or cyanosis. NEUROLOGIC: Slurred speech SKIN: There are no ulcers or rashes noted. PSYCHIATRIC: The patient has a normal affect.  STUDIES:   I have ordered and reviewed her duplex.  The left SFA stent is widely patent.  There is 75-99 percent right SFA stenosis.  ABI on the right is 0.75.  On the left is 0.62  MEDICAL ISSUES:   The patient does not have symptoms of claudication in either leg.  She does not have any open wounds.  No intervention is recommended.  There is stenosis on the right leg and we will continue to monitor.  As long as she remains without open wounds, medical management is favored.  Open one year with repeat vascular lab studies.  2  Durene Cal, MD Vascular and Vein Specialists of [facility] [phone] Pager [phone]

## 2016-07-18 NOTE — Addendum Note (Signed)
Addended by: Burton ApleyPETTY, Kolbi Tofte A on: 07/18/2016 02:22 PM   Modules accepted: Orders

## 2016-08-09 ENCOUNTER — Telehealth: Payer: Self-pay | Admitting: *Deleted

## 2016-08-09 NOTE — Telephone Encounter (Signed)
Attempted to call patient for her 2 year follow-up phone call [facility].

## 2016-08-30 ENCOUNTER — Encounter (HOSPITAL_COMMUNITY): Payer: Self-pay | Admitting: Emergency Medicine

## 2016-08-30 ENCOUNTER — Other Ambulatory Visit: Payer: Self-pay

## 2016-08-30 ENCOUNTER — Emergency Department (HOSPITAL_COMMUNITY): Payer: Medicare (Managed Care)

## 2016-08-30 ENCOUNTER — Emergency Department (HOSPITAL_COMMUNITY)
Admission: EM | Admit: 2016-08-30 | Discharge: 2016-08-31 | Disposition: A | Discharge status: Home / Self Care | Payer: Medicare (Managed Care) | Attending: Emergency Medicine | Admitting: Emergency Medicine

## 2016-08-30 DIAGNOSIS — I10 Essential (primary) hypertension: Secondary | ICD-10-CM | POA: Diagnosis not present

## 2016-08-30 DIAGNOSIS — I951 Orthostatic hypotension: Secondary | ICD-10-CM | POA: Diagnosis not present

## 2016-08-30 DIAGNOSIS — E860 Dehydration: Secondary | ICD-10-CM | POA: Insufficient documentation

## 2016-08-30 DIAGNOSIS — R42 Dizziness and giddiness: Secondary | ICD-10-CM | POA: Diagnosis not present

## 2016-08-30 DIAGNOSIS — R51 Headache: Secondary | ICD-10-CM | POA: Diagnosis not present

## 2016-08-30 DIAGNOSIS — R531 Weakness: Secondary | ICD-10-CM | POA: Diagnosis present

## 2016-08-30 DIAGNOSIS — Z8673 Personal history of transient ischemic attack (TIA), and cerebral infarction without residual deficits: Secondary | ICD-10-CM | POA: Diagnosis not present

## 2016-08-30 LAB — URINALYSIS, ROUTINE W REFLEX MICROSCOPIC
Bilirubin Urine: NEGATIVE
Glucose, UA: NEGATIVE mg/dL
Hgb urine dipstick: NEGATIVE
KETONES UR: NEGATIVE mg/dL
Nitrite: NEGATIVE
PH: 5 (ref 5.0–8.0)
Protein, ur: NEGATIVE mg/dL
SPECIFIC GRAVITY, URINE: 1.009 (ref 1.005–1.030)

## 2016-08-30 LAB — COMPREHENSIVE METABOLIC PANEL
ALBUMIN: 3 g/dL — AB (ref 3.5–5.0)
ALT: 14 U/L (ref 14–54)
AST: 29 U/L (ref 15–41)
Alkaline Phosphatase: 57 U/L (ref 38–126)
Anion gap: 8 (ref 5–15)
BUN: 23 mg/dL — AB (ref 6–20)
CHLORIDE: 103 mmol/L (ref 101–111)
CO2: 23 mmol/L (ref 22–32)
CREATININE: 1.61 mg/dL — AB (ref 0.44–1.00)
Calcium: 8.2 mg/dL — ABNORMAL LOW (ref 8.9–10.3)
GFR calc Af Amer: 34 mL/min — ABNORMAL LOW (ref >60)
GFR calc non Af Amer: 29 mL/min — ABNORMAL LOW (ref >60)
GLUCOSE: 107 mg/dL — AB (ref 65–99)
Potassium: 4.8 mmol/L (ref 3.5–5.1)
Sodium: 134 mmol/L — ABNORMAL LOW (ref 135–145)
Total Bilirubin: 0.4 mg/dL (ref 0.3–1.2)
Total Protein: 5.5 g/dL — ABNORMAL LOW (ref 6.5–8.1)

## 2016-08-30 LAB — DIFFERENTIAL
BASOS ABS: 0 10*3/uL (ref 0.0–0.1)
BASOS PCT: 0 %
Eosinophils Absolute: 0.1 10*3/uL (ref 0.0–0.7)
Eosinophils Relative: 1 %
LYMPHS ABS: 0.7 10*3/uL (ref 0.7–4.0)
Lymphocytes Relative: 19 %
MONOS PCT: 7 %
Monocytes Absolute: 0.2 10*3/uL (ref 0.1–1.0)
NEUTROS ABS: 2.7 10*3/uL (ref 1.7–7.7)
Neutrophils Relative %: 73 %

## 2016-08-30 MED ORDER — SODIUM CHLORIDE 0.9 % IV BOLUS (SEPSIS)
1000.0000 mL | Freq: Once | INTRAVENOUS | Status: AC
  Administered 2016-08-30: 1000 mL via INTRAVENOUS

## 2016-08-30 NOTE — ED Notes (Signed)
Pt ambulated in hall with steady gait with walker to assist, pt states walking per normal.

## 2016-08-30 NOTE — ED Notes (Signed)
Emma PurserDenise Schnoebelen [phone] (Daughter)

## 2016-08-30 NOTE — ED Triage Notes (Signed)
Weakness, dizziness, and headache began at 1230 today.  Right sided deficits and speech deficits from previous stroke.  No new onset of deficits today.  Pupils restricted but equal and reactive.  On blood thinners.  Denies narcotic use.

## 2016-08-30 NOTE — ED Notes (Signed)
[name] [phone], Daughter leaving contact info incase PT status

## 2016-08-30 NOTE — ED Notes (Signed)
Refer to downtime paperwork 

## 2016-08-31 LAB — COMPREHENSIVE METABOLIC PANEL
ALBUMIN: 3.3 g/dL — AB (ref 3.5–5.0)
ALT: 13 U/L — ABNORMAL LOW (ref 14–54)
ANION GAP: 8 (ref 5–15)
AST: 25 U/L (ref 15–41)
Alkaline Phosphatase: 55 U/L (ref 38–126)
BILIRUBIN TOTAL: 0.4 mg/dL (ref 0.3–1.2)
BUN: 24 mg/dL — ABNORMAL HIGH (ref 6–20)
CO2: 24 mmol/L (ref 22–32)
Calcium: 8.7 mg/dL — ABNORMAL LOW (ref 8.9–10.3)
Chloride: 105 mmol/L (ref 101–111)
Creatinine, Ser: 1.74 mg/dL — ABNORMAL HIGH (ref 0.44–1.00)
GFR calc Af Amer: 31 mL/min — ABNORMAL LOW (ref >60)
GFR calc non Af Amer: 27 mL/min — ABNORMAL LOW (ref >60)
GLUCOSE: 186 mg/dL — AB (ref 65–99)
POTASSIUM: 4.3 mmol/L (ref 3.5–5.1)
Sodium: 137 mmol/L (ref 135–145)
TOTAL PROTEIN: 5.9 g/dL — AB (ref 6.5–8.1)

## 2016-08-31 LAB — CBC
HEMATOCRIT: 33 % — AB (ref 36.0–46.0)
Hemoglobin: 10.4 g/dL — ABNORMAL LOW (ref 12.0–15.0)
MCH: 26.9 pg (ref 26.0–34.0)
MCHC: 31.5 g/dL (ref 30.0–36.0)
MCV: 85.3 fL (ref 78.0–100.0)
Platelets: 169 10*3/uL (ref 150–400)
RBC: 3.87 MIL/uL (ref 3.87–5.11)
RDW: 14.7 % (ref 11.5–15.5)
WBC: 3.6 10*3/uL — ABNORMAL LOW (ref 4.0–10.5)

## 2016-08-31 LAB — I-STAT TROPONIN, ED: TROPONIN I, POC: 0 ng/mL (ref 0.00–0.08)

## 2016-08-31 LAB — I-STAT CHEM 8, ED
BUN: 26 mg/dL — ABNORMAL HIGH (ref 6–20)
CREATININE: 1.2 mg/dL — AB (ref 0.44–1.00)
Calcium, Ion: 0.89 mmol/L — CL (ref 1.15–1.40)
Chloride: 111 mmol/L (ref 101–111)
Glucose, Bld: 104 mg/dL — ABNORMAL HIGH (ref 65–99)
HEMATOCRIT: 23 % — AB (ref 36.0–46.0)
HEMOGLOBIN: 7.8 g/dL — AB (ref 12.0–15.0)
POTASSIUM: 3.8 mmol/L (ref 3.5–5.1)
SODIUM: 141 mmol/L (ref 135–145)
TCO2: 19 mmol/L (ref 0–100)

## 2016-08-31 LAB — I-STAT CG4 LACTIC ACID, ED: Lactic Acid, Venous: 1.41 mmol/L (ref 0.5–1.9)

## 2016-08-31 LAB — MAGNESIUM: MAGNESIUM: 2.2 mg/dL (ref 1.7–2.4)

## 2016-08-31 NOTE — ED Provider Notes (Addendum)
[facility]-EMERGENCY DEPT Provider Note   CSN: [id number] Arrival date & time: 08/30/16  1633     History   Chief Complaint Chief Complaint  Patient presents with  . Generalized weakness  . Headache  . Dizziness    HPI [name] is a 81 y.o. female.  HPI  81 yo F with h/o HTN, CVA here with lightheadedness and weakness, with mild HA. Pt stats she was in her usual state of health until today. She was sitting down for some time when she stood up to try to walk to the restroom. SHe reports that upon standing, she began feeling lightheaded and weak. She also had a mild aching, generalized headache. She tried to walk to the restroom but continued to feel lightheaded and was unable to make it. She sat back down and her sx resolved. She does admit she has been out in the heat recently and nto eating/drinking as much as usual. No fevers. She currently feels back to her baseline, though she continues to feel weak upon standing. No recent trauma. No seizures.  Past Medical History:  Diagnosis Date  . Hypertension   . Stroke [facility])     There are no active problems to display for this patient.   Past Surgical History:  Procedure Laterality Date  . BOWEL RESECTION      OB History    No data available       Home Medications    Prior to Admission medications   Not on File    Family History History reviewed. No pertinent family history.  Social History Social History  Substance Use Topics  . Smoking status: Never Smoker  . Smokeless tobacco: Never Used  . Alcohol use No     Allergies   Aspirin   Review of Systems Review of Systems  Constitutional: Positive for fatigue. Negative for chills and fever.  HENT: Negative for congestion, rhinorrhea and sore throat.   Eyes: Negative for visual disturbance.  Respiratory: Negative for cough, shortness of breath and wheezing.   Cardiovascular: Negative for chest pain and leg swelling.  Gastrointestinal: Negative for  abdominal pain, diarrhea, nausea and vomiting.  Genitourinary: Negative for dysuria, flank pain, vaginal bleeding and vaginal discharge.  Musculoskeletal: Negative for neck pain.  Skin: Negative for rash.  Allergic/Immunologic: Negative for immunocompromised state.  Neurological: Positive for weakness and light-headedness. Negative for syncope and headaches.  Hematological: Does not bruise/bleed easily.  All other systems reviewed and are negative.    Physical Exam Updated Vital Signs BP (!) 178/60   Pulse 60   Temp 98.3 F (36.8 C) (Oral)   Resp 14   Ht 5\' 7"  (1.702 m)   Wt 61.2 kg (135 lb)   SpO2 100%   BMI 21.14 kg/m   Physical Exam  Constitutional: She is oriented to person, place, and time. She appears well-developed and well-nourished. No distress.  HENT:  Head: Normocephalic and atraumatic.  Dry MM  Eyes: Conjunctivae are normal.  Neck: Neck supple.  Cardiovascular: Normal rate, regular rhythm and normal heart sounds.  Exam reveals no friction rub.   No murmur heard. Pulmonary/Chest: Effort normal and breath sounds normal. No respiratory distress. She has no wheezes. She has no rales.  Abdominal: She exhibits no distension.  Musculoskeletal: She exhibits no edema.  Neurological: She is alert and oriented to person, place, and time. She exhibits normal muscle tone.  Speech mildly dysarthric. Alert and oriented x 4. Face is symmetric, tongue midline. Strength 4/5 RUE and  RLE, 5/5 throughout left. No tremors. No ataxia on FTN testing. Endorses normal sensation to light touch bl UE and LE.  Skin: Skin is warm. Capillary refill takes less than 2 seconds.  Psychiatric: She has a normal mood and affect.  Nursing note and vitals reviewed.    ED Treatments / Results  Labs (all labs ordered are listed, but only abnormal results are displayed) Labs Reviewed  URINALYSIS, ROUTINE W REFLEX MICROSCOPIC - Abnormal; Notable for the following:       Result Value   APPearance  HAZY (*)    Leukocytes, UA LARGE (*)    Bacteria, UA FEW (*)    Squamous Epithelial / LPF 6-30 (*)    All other components within normal limits  COMPREHENSIVE METABOLIC PANEL - Abnormal; Notable for the following:    Sodium 134 (*)    Glucose, Bld 107 (*)    BUN 23 (*)    Creatinine, Ser 1.61 (*)    Calcium 8.2 (*)    Total Protein 5.5 (*)    Albumin 3.0 (*)    GFR calc non Af Amer 29 (*)    GFR calc Af Amer 34 (*)    All other components within normal limits  CBC - Abnormal; Notable for the following:    WBC 3.6 (*)    Hemoglobin 10.4 (*)    HCT 33.0 (*)    All other components within normal limits  COMPREHENSIVE METABOLIC PANEL - Abnormal; Notable for the following:    Glucose, Bld 186 (*)    BUN 24 (*)    Creatinine, Ser 1.74 (*)    Calcium 8.7 (*)    Total Protein 5.9 (*)    Albumin 3.3 (*)    ALT 13 (*)    GFR calc non Af Amer 27 (*)    GFR calc Af Amer 31 (*)    All other components within normal limits  I-STAT CHEM 8, ED - Abnormal; Notable for the following:    BUN 26 (*)    Creatinine, Ser 1.20 (*)    Glucose, Bld 104 (*)    Calcium, Ion 0.89 (*)    Hemoglobin 7.8 (*)    HCT 23.0 (*)    All other components within normal limits  DIFFERENTIAL  MAGNESIUM  I-STAT CG4 LACTIC ACID, ED  I-STAT TROPONIN, ED  I-STAT CG4 LACTIC ACID, ED    EKG  EKG Interpretation None       Radiology Dg Chest 2 View  Result Date: 08/30/2016 CLINICAL DATA:  Weakness EXAM: CHEST  2 VIEW COMPARISON:  None. FINDINGS: Heart is normal in size. Vascular congestion. No consolidation or interstitial edema. No pneumothorax or pleural effusion. IMPRESSION: Vascular congestion without pulmonary edema. Electronically Signed   By: Jolaine Click M.D.   On: 08/30/2016 20:10   Ct Head Wo Contrast  Result Date: 08/30/2016 CLINICAL DATA:  81 y/o  F; headaches and dizziness. EXAM: CT HEAD WITHOUT CONTRAST TECHNIQUE: Contiguous axial images were obtained from the base of the skull through the  vertex without intravenous contrast. COMPARISON:  None. FINDINGS: Brain: Multiple areas of hypoattenuation with loss of gray-white differentiation in the right occipital lobe, left parietal lobe, and bilateral frontal lobes compatible with areas of infarction. Cavum septum pellucidum. No hydrocephalus, acute hemorrhage, or extra-axial collection. No focal mass effect Vascular: No hyperdense vessel identified. Extensive calcific atherosclerosis of carotid siphons. Skull: Normal. Negative for fracture or focal lesion. Sinuses/Orbits: No acute finding. Other: None. IMPRESSION: Multiple cortical infarctions in  right occipital, left parietal, and bilateral frontal lobes. Infarcts have variable attenuation and volume loss compatible with infarcts of differing ages. No infarct appears acute, but subacute infarcts are possible. Consider MRI of the brain for further characterization. Electronically Signed   By: Mitzi Hansen M.D.   On: 08/30/2016 19:39   Mr Brain Wo Contrast  Result Date: 08/30/2016 CLINICAL DATA:  Weakness, and dizziness and headache beginning at 12:30 today. History of stroke and residual RIGHT-sided deficits and speech difficulties. On blood thinners. EXAM: MRI HEAD WITHOUT CONTRAST TECHNIQUE: Multiplanar, multiecho pulse sequences of the brain and surrounding structures were obtained without intravenous contrast. COMPARISON:  CT HEAD August 30, 2016 and CT HEAD May 01, 2016 and MRI of the head January 09, 2012 FINDINGS: BRAIN: No reduced diffusion to suggest acute ischemia. RIGHT temporal occipital, LEFT occipital, bifrontal and LEFT parietal encephalomalacia present on prior MRI. Old small bilateral cerebellar infarcts. Scant susceptibility artifact LEFT parietal lobe unchanged compatible with mineralization. Cavum septum pellucidum. Moderate ventriculomegaly on the basis of global parenchymal brain volume loss. Patchy supratentorial could matter FLAIR T2 hyperintensities. No midline  shift, mass effect or masses. No abnormal extra-axial fluid collections. 6 mm pineal cyst. VASCULAR: Normal major intracranial vascular flow voids present at skull base. SKULL AND UPPER CERVICAL SPINE: Empty sella. No suspicious calvarial bone marrow signal. Craniocervical junction maintained. SINUSES/ORBITS: The mastoid air-cells and included paranasal sinuses are well-aerated. The included ocular globes and orbital contents are non-suspicious. Status post RIGHT ocular lens implant. OTHER: Patient is edentulous. IMPRESSION: 1. No acute intracranial process. 2. Stable old LEFT anterior cerebral artery, bilateral MCA, bilateral PCA territory infarcts. Old small cerebellar infarcts. 3. Stable moderate parenchymal brain volume loss for age and, mild to moderate chronic small vessel ischemic disease. Acute findings discussed with and reconfirmed by Dr.Belicia Difatta on 08/30/2016 at 11:35 pm. Electronically Signed   By: Awilda Metro M.D.   On: 08/30/2016 23:35    Procedures Procedures (including critical care time)  Medications Ordered in ED Medications  sodium chloride 0.9 % bolus 1,000 mL (0 mLs Intravenous Stopped 08/30/16 1912)     Initial Impression / Assessment and Plan / ED Course  I have reviewed the triage vital signs and the nursing notes.  Pertinent labs & imaging results that were available during my care of the patient were reviewed by me and considered in my medical decision making (see chart for details).     81 yo F with h/o CVA here with lightheadedness upon standing, now improved after sitting back down. Labs as above. Pt has baseline CKD but mildly elevated BUN, likely 2/2 dehydration. Cr is o/w at baseline. CBC without any new anemia or changes. UA without signs of UTI. CXR without signs of PNA. LA is normal and I do not suspect sepsis/occult infection.  Suspect pt's sx are 2/2 orthostasis in setting of dehydration and heat exposure. She feels markedly improved after IVF.  Initially, CT Head concerning for multiple CVAs and given high risk history, MR obtained to eval for new CVA. Fortunately, there are no new ischemic changes on MR and I do not suspect acute CVA as etiology for her sx. Her sx are reproducible upon standing, highly positional, and c/w orthostasis. VS remain stable but she has reproduction of her sx here in ED with orthostatic vital check.  Following IVF, pt feels improved. She is now ambulatory at her baseline and able to walk throughout ED with walker. She stats she feels like she is walking at  baseline. Family is here and feels that pt appears imrpoved and is at her baseline. Will d/c with encouraged fluids, outpt follow-up.  Final Clinical Impressions(s) / ED Diagnoses   Final diagnoses:  Dehydration  Orthostasis    New Prescriptions There are no discharge medications for this patient.    [name], Emma Lonon, MD 08/31/16 1135    [name], Emma Bechtol, MD 08/31/16 [phone]

## 2016-09-01 ENCOUNTER — Encounter: Payer: Self-pay | Admitting: Surgery

## 2016-09-08 ENCOUNTER — Telehealth: Payer: Self-pay | Admitting: *Deleted

## 2016-09-08 NOTE — Telephone Encounter (Signed)
Called [name] for her 2 year follow-up for the [facility] registry. I spoke with her daughter Angelique Blonder. She says her mother has been doing well and has had no additional interventions to her left leg. I will follow-up again in a year for the final visit.

## 2016-09-11 ENCOUNTER — Emergency Department (HOSPITAL_COMMUNITY): Payer: Medicare (Managed Care)

## 2016-09-11 ENCOUNTER — Inpatient Hospital Stay (HOSPITAL_COMMUNITY)
Admission: EM | Admit: 2016-09-11 | Discharge: 2016-09-16 | DRG: 418 | Disposition: A | Discharge status: Home Health | Payer: Medicare (Managed Care) | Attending: Internal Medicine | Admitting: Internal Medicine

## 2016-09-11 ENCOUNTER — Encounter (HOSPITAL_COMMUNITY): Payer: Self-pay | Admitting: Emergency Medicine

## 2016-09-11 DIAGNOSIS — R55 Syncope and collapse: Secondary | ICD-10-CM

## 2016-09-11 DIAGNOSIS — N183 Chronic kidney disease, stage 3 (moderate): Secondary | ICD-10-CM | POA: Diagnosis not present

## 2016-09-11 DIAGNOSIS — E1151 Type 2 diabetes mellitus with diabetic peripheral angiopathy without gangrene: Secondary | ICD-10-CM | POA: Diagnosis present

## 2016-09-11 DIAGNOSIS — Z90710 Acquired absence of both cervix and uterus: Secondary | ICD-10-CM

## 2016-09-11 DIAGNOSIS — Z86010 Personal history of colonic polyps: Secondary | ICD-10-CM

## 2016-09-11 DIAGNOSIS — K529 Noninfective gastroenteritis and colitis, unspecified: Secondary | ICD-10-CM | POA: Diagnosis present

## 2016-09-11 DIAGNOSIS — E785 Hyperlipidemia, unspecified: Secondary | ICD-10-CM | POA: Diagnosis present

## 2016-09-11 DIAGNOSIS — F329 Major depressive disorder, single episode, unspecified: Secondary | ICD-10-CM | POA: Diagnosis present

## 2016-09-11 DIAGNOSIS — K8050 Calculus of bile duct without cholangitis or cholecystitis without obstruction: Secondary | ICD-10-CM

## 2016-09-11 DIAGNOSIS — I10 Essential (primary) hypertension: Secondary | ICD-10-CM | POA: Diagnosis present

## 2016-09-11 DIAGNOSIS — I69322 Dysarthria following cerebral infarction: Secondary | ICD-10-CM

## 2016-09-11 DIAGNOSIS — Z8249 Family history of ischemic heart disease and other diseases of the circulatory system: Secondary | ICD-10-CM

## 2016-09-11 DIAGNOSIS — Z9049 Acquired absence of other specified parts of digestive tract: Secondary | ICD-10-CM

## 2016-09-11 DIAGNOSIS — M7989 Other specified soft tissue disorders: Secondary | ICD-10-CM | POA: Diagnosis present

## 2016-09-11 DIAGNOSIS — R011 Cardiac murmur, unspecified: Secondary | ICD-10-CM | POA: Diagnosis present

## 2016-09-11 DIAGNOSIS — I5032 Chronic diastolic (congestive) heart failure: Secondary | ICD-10-CM | POA: Diagnosis not present

## 2016-09-11 DIAGNOSIS — I69351 Hemiplegia and hemiparesis following cerebral infarction affecting right dominant side: Secondary | ICD-10-CM

## 2016-09-11 DIAGNOSIS — L97329 Non-pressure chronic ulcer of left ankle with unspecified severity: Secondary | ICD-10-CM | POA: Diagnosis present

## 2016-09-11 DIAGNOSIS — K8010 Calculus of gallbladder with chronic cholecystitis without obstruction: Principal | ICD-10-CM | POA: Diagnosis present

## 2016-09-11 DIAGNOSIS — F0390 Unspecified dementia without behavioral disturbance: Secondary | ICD-10-CM | POA: Diagnosis present

## 2016-09-11 DIAGNOSIS — K862 Cyst of pancreas: Secondary | ICD-10-CM | POA: Diagnosis present

## 2016-09-11 DIAGNOSIS — Z818 Family history of other mental and behavioral disorders: Secondary | ICD-10-CM

## 2016-09-11 DIAGNOSIS — Z87891 Personal history of nicotine dependence: Secondary | ICD-10-CM

## 2016-09-11 DIAGNOSIS — R1011 Right upper quadrant pain: Secondary | ICD-10-CM | POA: Diagnosis present

## 2016-09-11 DIAGNOSIS — Z7902 Long term (current) use of antithrombotics/antiplatelets: Secondary | ICD-10-CM

## 2016-09-11 DIAGNOSIS — Z823 Family history of stroke: Secondary | ICD-10-CM

## 2016-09-11 DIAGNOSIS — Z833 Family history of diabetes mellitus: Secondary | ICD-10-CM

## 2016-09-11 DIAGNOSIS — R42 Dizziness and giddiness: Secondary | ICD-10-CM | POA: Diagnosis not present

## 2016-09-11 DIAGNOSIS — E1122 Type 2 diabetes mellitus with diabetic chronic kidney disease: Secondary | ICD-10-CM | POA: Diagnosis present

## 2016-09-11 DIAGNOSIS — Z886 Allergy status to analgesic agent status: Secondary | ICD-10-CM

## 2016-09-11 DIAGNOSIS — Z79899 Other long term (current) drug therapy: Secondary | ICD-10-CM

## 2016-09-11 DIAGNOSIS — N189 Chronic kidney disease, unspecified: Secondary | ICD-10-CM | POA: Diagnosis present

## 2016-09-11 DIAGNOSIS — I130 Hypertensive heart and chronic kidney disease with heart failure and stage 1 through stage 4 chronic kidney disease, or unspecified chronic kidney disease: Secondary | ICD-10-CM | POA: Diagnosis present

## 2016-09-11 DIAGNOSIS — Z419 Encounter for procedure for purposes other than remedying health state, unspecified: Secondary | ICD-10-CM

## 2016-09-11 HISTORY — DX: Chronic diastolic (congestive) heart failure: I50.32

## 2016-09-11 LAB — COMPREHENSIVE METABOLIC PANEL
ALBUMIN: 3.2 g/dL — AB (ref 3.5–5.0)
ALK PHOS: 63 U/L (ref 38–126)
ALT: 14 U/L (ref 14–54)
ANION GAP: 7 (ref 5–15)
AST: 25 U/L (ref 15–41)
BILIRUBIN TOTAL: 0.2 mg/dL — AB (ref 0.3–1.2)
BUN: 23 mg/dL — AB (ref 6–20)
CALCIUM: 8.9 mg/dL (ref 8.9–10.3)
CO2: 26 mmol/L (ref 22–32)
Chloride: 105 mmol/L (ref 101–111)
Creatinine, Ser: 1.67 mg/dL — ABNORMAL HIGH (ref 0.44–1.00)
GFR calc Af Amer: 33 mL/min — ABNORMAL LOW (ref >60)
GFR, EST NON AFRICAN AMERICAN: 28 mL/min — AB (ref >60)
GLUCOSE: 124 mg/dL — AB (ref 65–99)
POTASSIUM: 4.2 mmol/L (ref 3.5–5.1)
Sodium: 138 mmol/L (ref 135–145)
TOTAL PROTEIN: 6 g/dL — AB (ref 6.5–8.1)

## 2016-09-11 LAB — CBC WITH DIFFERENTIAL/PLATELET
BASOS PCT: 0 %
Basophils Absolute: 0 10*3/uL (ref 0.0–0.1)
EOS ABS: 0 10*3/uL (ref 0.0–0.7)
EOS PCT: 1 %
HCT: 31.8 % — ABNORMAL LOW (ref 36.0–46.0)
HEMOGLOBIN: 10.5 g/dL — AB (ref 12.0–15.0)
LYMPHS ABS: 0.6 10*3/uL — AB (ref 0.7–4.0)
Lymphocytes Relative: 13 %
MCH: 28.5 pg (ref 26.0–34.0)
MCHC: 33 g/dL (ref 30.0–36.0)
MCV: 86.2 fL (ref 78.0–100.0)
MONOS PCT: 9 %
Monocytes Absolute: 0.4 10*3/uL (ref 0.1–1.0)
NEUTROS PCT: 77 %
Neutro Abs: 3.6 10*3/uL (ref 1.7–7.7)
Platelets: 200 10*3/uL (ref 150–400)
RBC: 3.69 MIL/uL — ABNORMAL LOW (ref 3.87–5.11)
RDW: 14.8 % (ref 11.5–15.5)
WBC: 4.7 10*3/uL (ref 4.0–10.5)

## 2016-09-11 LAB — URINALYSIS, ROUTINE W REFLEX MICROSCOPIC
Bilirubin Urine: NEGATIVE
Glucose, UA: NEGATIVE mg/dL
Hgb urine dipstick: NEGATIVE
Ketones, ur: NEGATIVE mg/dL
NITRITE: NEGATIVE
PROTEIN: NEGATIVE mg/dL
SPECIFIC GRAVITY, URINE: 1.008 (ref 1.005–1.030)
pH: 5 (ref 5.0–8.0)

## 2016-09-11 LAB — TROPONIN I: Troponin I: <0.03 ng/mL (ref <0.03)

## 2016-09-11 LAB — LIPASE, BLOOD: LIPASE: 37 U/L (ref 11–51)

## 2016-09-11 MED ORDER — IOPAMIDOL (ISOVUE-300) INJECTION 61%
30.0000 mL | Freq: Once | INTRAVENOUS | Status: AC | PRN
  Administered 2016-09-11: 30 mL via ORAL

## 2016-09-11 MED ORDER — IOPAMIDOL (ISOVUE-300) INJECTION 61%
INTRAVENOUS | Status: AC
  Administered 2016-09-11: 30 mL via ORAL
  Filled 2016-09-11: qty 30

## 2016-09-11 MED ORDER — SODIUM CHLORIDE 0.9 % IV BOLUS (SEPSIS): 500.0000 mL | Freq: Once | INTRAVENOUS | Status: DC

## 2016-09-11 NOTE — ED Notes (Signed)
Patient returned from CT

## 2016-09-11 NOTE — ED Notes (Signed)
Patient's daughter at bedside requesting to now what is going on with patient.  Informed daughter that once all blood work and CT scan results are back, Dr Dalene Seltzer will be back in to go over results and plan.    Daughter given ginger ale.

## 2016-09-11 NOTE — ED Notes (Signed)
Bed: WA17 Expected date:  Expected time:  Means of arrival:  Comments: Ems-ABDOMINAL PAIN

## 2016-09-11 NOTE — H&P (Signed)
[name]:[id number] DOB: [birth year] DOA: 09/11/2016     PCP: Jethro Bastos, MD   Outpatient Specialists: Vascular Surgery [facility], [facility] Patient coming from: home Lives  With family    Chief Complaint: RUQ abdominal pain  HPI: [name] is a 81 y.o. female with medical history significant of HTN, history of stroke, PVD, CKD, DM2, diastolic CHF    Presented with worsening right upper quadrant pain near syncopal episode today. EMS was called on arrival normotensive CBG 211 Pain started after she ate breakfast today initially was severe but then started to subside. No associated nausea vomiting. She has chronic diarrhea but that has not changed. 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      Excl. in GC?   RR 18 at bedtime 65 BP 166/68 WBC 4.7 hemoglobin 10.5 Sodium 1:30 8K4.2 BUN 23 CR 1.67 (at baseline) albumin 3.2 Lipase 37 LFTs within normal limits CT abdomen; intra-and extrahepatic biliary duct distention cholelithiasis intra-and extrahepatic biliary duct distention  Abdominal ultrasound showing cholelithiasis  no evidence of cholecystitis intra-and extrahepatic biliary dilatation noted  Chest x-ray: Mild pulmonary congestion Following Medications were  ordered in ER: Medications  iopamidol (ISOVUE-300) 61 % injection 30 mL (30 mLs Oral Contrast Given 09/11/16 1611)     ER provider discussed case with: [facility]  and general surgery  Hospitalist was called for admission for symptomatic cholelithiasis with significant abdominal pain  Review of Systems:    Pertinent positives include: fatigue,  abdominal pain,   Constitutional:  No weight loss, night sweats, Fevers, chills, weight loss  HEENT:  No headaches, Difficulty swallowing,Tooth/dental problems,Sore throat,  No sneezing, itching, ear ache, nasal congestion, post nasal drip,  Cardio-vascular:  No chest pain, Orthopnea, PND, anasarca, dizziness, palpitations.no Bilateral lower extremity swelling  GI:  No heartburn, indigestion, nausea, vomiting, diarrhea, change in bowel habits, loss of appetite, melena, blood in stool, hematemesis Resp:  no shortness of breath at rest. No dyspnea on exertion, No excess mucus, no productive cough, No non-productive cough, No coughing up of blood.No change in color of mucus.No wheezing. Skin:  no rash or lesions. No jaundice GU:  no dysuria, change in color of urine, no urgency or frequency. No straining to urinate.  No flank pain.  Musculoskeletal:  No joint pain or no joint swelling. No decreased range of motion. No back pain.  Psych:  No change in mood or affect. No depression or anxiety. No memory loss.  Neuro:  no localizing neurological complaints, no tingling, no weakness, no double vision, no gait abnormality, no slurred speech, no confusion  As per HPI otherwise 10 point review of systems negative.   Past Medical History: Past Medical History:  Diagnosis Date  . Arthritis    hands  . Asthma   . Chronic diastolic CHF (congestive heart failure) (HCC)   . Chronic kidney disease (CKD), stage III (moderate)   . Dementia   . Depression   . Diabetes mellitus without complication (HCC)   . Diverticulosis   . Hx of adenomatous colonic  polyps   . Hyperlipidemia   . Hypertension   . Hypochromic anemia   . Internal hemorrhoids   . Stroke [facility]) May 2009   multiple, last one Nov 2012, 2 strokes with right sided deficits and slurred speech  . Stroke [facility])    Past Surgical History:  Procedure Laterality Date  . ABDOMINAL HYSTERECTOMY    . ABDOMINAL SURGERY    . BOWEL RESECTION    . COLECTOMY    . ESOPHAGOGASTRODUODENOSCOPY N/A 02/01/2016   Procedure: ESOPHAGOGASTRODUODENOSCOPY (EGD);  Surgeon: Beverley Fiedler, MD;  Location: [facility] ENDOSCOPY;  Service: Endoscopy;  Laterality: N/A;  . FLEXIBLE SIGMOIDOSCOPY N/A 05/28/2012   Procedure: FLEXIBLE SIGMOIDOSCOPY;  Surgeon: Hart Carwin, MD;  Location: [facility] ENDOSCOPY;  Service: Endoscopy;  Laterality: N/A;  . OTHER SURGICAL HISTORY     2 ear surgeries  . PERIPHERAL VASCULAR CATHETERIZATION N/A 07/21/2014   Procedure: Abdominal Aortogram w/Lower Extremity;  Surgeon: Nada Libman, MD;  Location: [facility] INVASIVE CV LAB;  Service: Cardiovascular;  Laterality: N/A;  . PERIPHERAL VASCULAR CATHETERIZATION N/A 11/23/2015   Procedure: Abdominal Aortogram w/Lower Extremity;  Surgeon: Nada Libman, MD;  Location: [facility] INVASIVE CV LAB;  Service: Cardiovascular;  Laterality: N/A;  . PERIPHERAL VASCULAR CATHETERIZATION  11/23/2015   Procedure: Peripheral Vascular Intervention;  Surgeon: Nada Libman, MD;  Location: [facility] INVASIVE CV LAB;  Service: Cardiovascular;;  Failed PVI of AT and Peroneal on left side  . SUBTOTAL COLECTOMY  2010   for stricturing from bouts of diverticulitis along with hx recurrent diverticular bleeds.  the surgery was a total abdominal colec  . TONSILLECTOMY    . VASCULAR SURGERY       Social History:  Ambulatory   walker      reports that she has never smoked. She has never used smokeless tobacco. She reports that she does not drink alcohol or use drugs.  Allergies:   Allergies  Allergen Reactions  . Aspirin Anaphylaxis  . Aspirin Nausea And Vomiting    Hurts stomach        Family History:   Family History  Problem Relation Age of Onset  . Diabetes Paternal Aunt   . Stroke Father   . Heart disease Father   . Diabetes Father   . Depression Father   . HIV Brother   . Prostate cancer Unknown   . Cancer Sister        unknown type  . Colon cancer Neg Hx     Medications: Prior to Admission medications   Medication Sig Start Date End Date Taking? Authorizing Provider  Cholecalciferol (VITAMIN D3) 1000 UNITS CAPS Take 1,000 Units by mouth daily.    Yes [provider]  clopidogrel (PLAVIX) 75 MG tablet Take 1 tablet (75 mg total) by mouth daily. Hold for a week, Then restart. 02/03/16  Yes Albertine Grates, MD  loratadine (CLARITIN) 10 MG tablet Take 10 mg by mouth  daily.    Yes [provider]  losartan (COZAAR) 25 MG tablet Take 1 tablet (25 mg total) by mouth daily. 02/03/16  Yes Albertine Grates, MD  Melatonin 300 MCG TABS Take 1 tablet by mouth at bedtime.   Yes [provider]  pantoprazole (PROTONIX) 40 MG tablet Take 1 tablet (40 mg total) by mouth daily. 03/07/16  Yes Unk Lightning, PA    Physical Exam: Patient Vitals for the past 24 hrs:  BP Temp Temp src Pulse Resp SpO2  09/11/16 2137 (!) 166/68 - - 65 18 100 %  09/11/16 1903 (!) 163/72 98.8 F (37.1 C) Oral 72 16 100 %  09/11/16 1709 (!) 170/73 98.8 F (37.1 C) Oral 77 16 100 %  09/11/16 1440 (!) 153/63 98.8 F (37.1 C) Oral 75 18 100 %    1. General:  in No Acute distress Speech dysarthric 2. Psychological: Alert and  Oriented 3. Head/ENT:    Dry Mucous Membranes                          Head Non traumatic, neck supple                            Poor Dentition 4. SKIN:  decreased Skin turgor,  Skin clean Dry and intact no rash 5. Heart: Regular rate and rhythm systolic Murmur, Rub or gallop 6. Lungs  no wheezes or crackles   7. Abdomen: Soft, RUQ tender, Non distended 8. Lower extremities: no clubbing, cyanosis,  Edema R > L 9. Neurologically chronic  right -sided weakness unchanged from baseline 10. MSK: Normal range of motion   body mass index is unknown because there is no height or weight on file.  Labs on Admission:   Labs on Admission: I have personally reviewed following labs and imaging studies  CBC:  Recent Labs Lab 09/11/16 1538  WBC 4.7  NEUTROABS 3.6  HGB 10.5*  HCT 31.8*  MCV 86.2  PLT 200   Basic Metabolic Panel:  Recent Labs Lab 09/11/16 1538  NA 138  K 4.2  CL 105  CO2 26  GLUCOSE 124*  BUN 23*  CREATININE 1.67*  CALCIUM 8.9   GFR: Estimated Creatinine Clearance: 26.4 mL/min (A) (by C-G formula based on SCr of 1.67 mg/dL (H)). Liver Function Tests:  Recent Labs Lab 09/11/16 1538  AST 25  ALT 14  ALKPHOS 63  BILITOT 0.2*  PROT 6.0*  ALBUMIN 3.2*    Recent Labs Lab 09/11/16 1538  LIPASE 37   No results for input(s): AMMONIA in the last 168 hours. Coagulation Profile: No results for input(s): INR, PROTIME in the last 168 hours. Cardiac Enzymes: No results for input(s): CKTOTAL, CKMB, CKMBINDEX, TROPONINI in the last 168 hours. BNP (last 3 results) No results for input(s): PROBNP in the last 8760 hours. HbA1C: No results for input(s): HGBA1C in the last 72 hours. CBG: No results for input(s): GLUCAP in the last 168 hours. Lipid Profile: No results for input(s): CHOL, HDL, LDLCALC, TRIG, CHOLHDL, LDLDIRECT in the last 72 hours. Thyroid Function Tests: No results for input(s): TSH, T4TOTAL, FREET4, T3FREE, THYROIDAB in the last 72 hours. Anemia Panel: No results for input(s): VITAMINB12, FOLATE, FERRITIN, TIBC, IRON, RETICCTPCT in the last 72 hours. Urine analysis:    Component Value Date/Time   COLORURINE YELLOW 09/11/2016 1608   APPEARANCEUR CLEAR 09/11/2016 1608   LABSPEC 1.008 09/11/2016 1608  PHURINE 5.0 09/11/2016 1608   GLUCOSEU NEGATIVE 09/11/2016 1608   HGBUR NEGATIVE 09/11/2016 1608   BILIRUBINUR NEGATIVE 09/11/2016 1608   KETONESUR NEGATIVE 09/11/2016 1608    PROTEINUR NEGATIVE 09/11/2016 1608   UROBILINOGEN 0.2 05/27/2012 1416   NITRITE NEGATIVE 09/11/2016 1608   LEUKOCYTESUR TRACE (A) 09/11/2016 1608   Sepsis Labs: @LABRCNTIP (procalcitonin:4,lacticidven:4) )No results found for this or any previous visit (from the past 240 hour(s)).     UA   no evidence of UTI    Lab Results  Component Value Date   HGBA1C 6.1 (H) 11/29/2010    Estimated Creatinine Clearance: 26.4 mL/min (A) (by C-G formula based on SCr of 1.67 mg/dL (H)).  BNP (last 3 results) No results for input(s): PROBNP in the last 8760 hours.   ECG REPORT  Independently reviewed Rate: 74  Rhythm: NSR ST&T Change: No acute ischemic changes   [id number]   There were no vitals filed for this visit.   Cultures:    Component Value Date/Time   SDES URINE, CATHETERIZED 04/30/2016 2354   SPECREQUEST NONE 04/30/2016 2354   CULT (A) 04/30/2016 2354    >=100,000 COLONIES/mL LACTOBACILLUS SPECIES Standardized susceptibility testing for this organism is not available.    REPTSTATUS 05/02/2016 FINAL 04/30/2016 2354     Radiological Exams on Admission: Ct Abdomen Pelvis Wo Contrast  Result Date: 09/11/2016 CLINICAL DATA:  Abdominal pain and near syncope. EXAM: CT ABDOMEN AND PELVIS WITHOUT CONTRAST TECHNIQUE: Multidetector CT imaging of the abdomen and pelvis was performed following the standard protocol without IV contrast. COMPARISON:  01/30/2016. FINDINGS: Lower chest:  Chronic interstitial changes noted at the lung bases. Hepatobiliary: No focal abnormality in the liver on this study without intravenous contrast. Calcified gallstones again noted. Mild intrahepatic biliary duct dilatation associated with mild fullness of the extrahepatic bile duct, not well demonstrated on this noncontrast exam. Prominence of the portal vein and attenuation likely related to the adjacent distended bile duct has average attenuation in the portal vein is similar to blood pool in the aorta and superior  mesenteric vein. Pancreas: Pancreas is diffusely atrophic. Spleen: No splenomegaly. No focal mass lesion. Adrenals/Urinary Tract: No adrenal nodule or mass. 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 No gross lesion identified in the right kidney. There is mild fullness of the right intrarenal collecting system. No overt right hydroureter. Urinary bladder unremarkable. Stomach/Bowel: Stomach is markedly distended. Duodenum is normally positioned as is the ligament of Treitz. No small bowel wall thickening. No small bowel dilatation. Patient is status post subtotal colectomy. Rectum is prominently distended with stool and air. Vascular/Lymphatic: There is abdominal aortic atherosclerosis without aneurysm. There is no gastrohepatic or hepatoduodenal ligament lymphadenopathy. No intraperitoneal or retroperitoneal lymphadenopathy. No pelvic sidewall lymphadenopathy. Reproductive: Uterus is surgically absent. There is no adnexal mass. Other: No intraperitoneal free fluid. Musculoskeletal: Bone windows reveal no worrisome lytic or sclerotic osseous lesions. Degenerative changes noted lumbar spine. IMPRESSION: 1. Mild intra and extrahepatic biliary duct distention, new in the interval. Correlation with liver function test may prove helpful. 2. Fairly marked distention of the stomach. No obstructing mass identified. Outlet obstruction or gastroparesis could have this appearance. 3. Cholelithiasis. 4. No small bowel dilatation although the rectum is distended with air and stool in this patient status post subtotal colectomy. 5.  Aortic Atherosclerois (ICD10-170.0) Electronically Signed   By: Kennith Center M.D.   On: 09/11/2016 17:33   Dg Chest 2 View  Result Date: 09/11/2016 CLINICAL DATA:  Near syncopal episode and abdominal  pain. EXAM: CHEST  2 VIEW COMPARISON:  08/30/2016 FINDINGS: Normal heart size with aortic atherosclerosis. Mild vascular congestion similar to prior. No effusion or pneumothorax. No pulmonary  consolidations. Osteoarthritis of the [facility] and glenohumeral joints bilaterally. No acute nor suspicious osseous abnormalities. No free air beneath the diaphragm. Moderate gaseous distention of the stomach. IMPRESSION: 1. Mild vascular congestion similar to prior without acute pneumonic consolidation, overt pulmonary edema nor pneumothorax. 2. Aortic atherosclerosis. 3. Moderate gaseous distention of the stomach. Electronically Signed   By: Tollie Eth M.D.   On: 09/11/2016 17:27   US Abdomen Limited Ruq  Result Date: 09/11/2016 CLINICAL DATA:  Right upper quadrant pain. EXAM: ULTRASOUND ABDOMEN LIMITED RIGHT UPPER QUADRANT COMPARISON:  None. FINDINGS: Gallbladder: Multiple stones in the gallbladder. Sludge identified as well. The wall thickness is increased measuring 6.9 mm. No Murphy's sign. No pericholecystic fluid. Common bile duct: Diameter: 12.2 mm proximally. Shadowing bowel gas obscures the distal portion. Liver: Intrahepatic ductal dilatation. No focal mass. Portal vein is patent on color Doppler imaging with normal direction of blood flow towards the liver. IMPRESSION: 1. Cholelithiasis, sludge, and wall thickening with no pericholecystic fluid or Murphy's sign. 2. Intra and extrahepatic biliary duct dilatation. An underlying cause is not identified. Recommend an MRCP to better evaluate the common bile duct for choledocholithiasis or obstructing lesion. Electronically Signed   By: Gerome Sam III M.D   On: 09/11/2016 19:48    Chart has been reviewed    Assessment/Plan  81 y.o. female with medical history significant of HTN, history of stroke, PVD, CKD, DM2, diastolic CHF   Admitted for  symptomatic cholelithiasis with significant abdominal pain  Present on Admission: . RUQ abdominal pain - ER discussed with [facility] , recommended MRCP in a.m. has been ordered. Continue pain management, repeat LFTs and ham nothing by mouth post midnight, hold Plavix in case patient may need any invasive  procedures. . Chronic diastolic CHF (congestive heart failure) (HCC) - currently appears to be close to euvolemic by 12 monitor for any evidence of fluid overload avoid over hydration.  . Chronic kidney disease - currently at baseline avoid nephrotoxic medications . HLD (hyperlipidemia) stable continue home medication . Accelerated Hypertension - significantly elevated blood pressures in the setting of Patient not being able to take her home medications were restarted give her beta when necessary given presyncope check echo gram cycle cardiac enzymes . RUQ pain -evaluate for obstructing stone or mass presyncope - . Her records patient has recurrent episodes of presyncope. Currently appears to have significantly elevated blood pressure. Noted to have cardiac murmur. Obtain echogram cycle cardiac enzymes patient is a vasculopath obtain carotid Dopplers Right lower extremity swelling patient has history of CVA. Residual right leg weakness which is likely what's contributing to  Swelling. Will obtain Dopplers to rule out DVT. Diabetes mellitus order sliding scale check TSH and hemoglobin A1c. CT scan showed evidence of stool in her rectum. Patient is stating she is rated to have her bowel movement she has frequent loose bowel movements at home is no solid bowel movement produced suspect possible stooling around the stool ball she may need more aggressive bowel regimen Other plan as per orders.  DVT prophylaxis:  SCD    Code Status:  FULL CODE  as per patient     Family Communication:   Family not   at  Bedside    Disposition Plan:       To home once workup is complete and patient is  stable           Consults called: [facility] will see in AM plan for MRCP  Admission status:   obs   Level of care   tele          I have spent a total of 56 min on this admission   Josefa Syracuse 09/11/2016, 10:40 PM    [facility]  Pager [phone]   after 2 AM please page floor coverage PA If  7AM-7PM, please contact the day team taking care of the patient  [email or web address]  Password [email or web address]

## 2016-09-11 NOTE — ED Provider Notes (Signed)
[facility]-EMERGENCY DEPT Provider Note   CSN: [id number] Arrival date & time: 09/11/16  1431     History   Chief Complaint Chief Complaint  Patient presents with  . Abdominal Pain  . Near Syncope    HPI [name] is a 81 y.o. female.  HPI   81 year old female with a history of dementia, diabetes, hypertension, hyperlipidemia, chronic diastolic CHF, CK D stage III, hysterectomy, CVA, bowel resection, presents with concern for abdominal pain. Reports sudden onset abdominal pain after eating breakfast today. Reports it began around 9 AM. His right upper quadrant abdominal pain and then generalized. Described as an ache. Initially was a 10 out of 10, but now is a 7 out of 10. Reports she does not want pain medication at this time. Denies nausea, vomiting. Reports that she has chronic loose stool, without change. Reports that when the pain was most severe she did feel dyspnea, but does not have dyspnea at this time. Reports chronic unchanged headaches, no worse today. No urinary symptoms. No fevers. Has not had anything to eat since the pain started, is not sure of other exacerbating or relieving factors.  Past Medical History:  Diagnosis Date  . Arthritis    hands  . Asthma   . Chronic diastolic CHF (congestive heart failure) (HCC)   . Chronic kidney disease (CKD), stage III (moderate)   . Dementia   . Depression   . Diabetes mellitus without complication (HCC)   . Diverticulosis   . Hx of adenomatous colonic polyps   . Hyperlipidemia   . Hypertension   . Hypochromic anemia   . Internal hemorrhoids   . Stroke [facility]) May 2009   multiple, last one Nov 2012, 2 strokes with right sided deficits and slurred speech  . Stroke [facility])     Patient Active Problem List   Diagnosis Date Noted  . RUQ abdominal pain 09/11/2016  . Postural dizziness with presyncope 09/11/2016  . RUQ pain 09/11/2016  . Stroke (cerebrum) (HCC) 04/30/2016  . Hyperkalemia 04/30/2016  . Acute encephalopathy  04/30/2016  . Chronic diastolic CHF (congestive heart failure) (HCC) 04/30/2016  . Multiple duodenal ulcers   . RLQ abdominal pain   . Gallstones   . Elevated liver enzymes   . Abnormal finding on GI tract imaging   . Pancreas cyst   . Choledocholithiasis 01/30/2016  . Acute kidney injury (HCC) 01/30/2016  . Non-intractable vomiting with nausea   . Bleeding 11/23/2015  . Chronic kidney disease 05/29/2012  . Skin ulcer, stage 2 (HCC) 05/29/2012  . Internal hemorrhoid, bleeding 05/29/2012  . Severe protein-calorie malnutrition (HCC) 03/24/2012  . Hyponatremia 03/23/2012  . Hypokalemia 03/23/2012  . Urinary tract infection 03/23/2012  . Physical deconditioning 03/23/2012  . CKD (chronic kidney disease) stage 3, GFR 30-59 ml/min 12/01/2010  . Encephalopathy 12/01/2010  . TIA (transient ischemic attack) 11/28/2010  . ASTHMA 08/25/2008  . COLONIC POLYPS, ADENOMATOUS, HX OF 08/25/2008  . HLD (hyperlipidemia) 08/17/2008  . Microcytic hypochromic anemia 08/17/2008  . Hypertension 08/17/2008  . History of CVA (cerebrovascular accident) 08/17/2008  . DIVERTICULITIS, COLON 08/17/2008  . HYPERGLYCEMIA 08/17/2008    Past Surgical History:  Procedure Laterality Date  . ABDOMINAL HYSTERECTOMY    . ABDOMINAL SURGERY    . BOWEL RESECTION    . COLECTOMY    . ESOPHAGOGASTRODUODENOSCOPY N/A 02/01/2016   Procedure: ESOPHAGOGASTRODUODENOSCOPY (EGD);  Surgeon: Beverley Fiedler, MD;  Location: [facility] ENDOSCOPY;  Service: Endoscopy;  Laterality: N/A;  . FLEXIBLE SIGMOIDOSCOPY N/A 05/28/2012  Procedure: FLEXIBLE SIGMOIDOSCOPY;  Surgeon: Hart Carwin, MD;  Location: [facility] ENDOSCOPY;  Service: Endoscopy;  Laterality: N/A;  . OTHER SURGICAL HISTORY     2 ear surgeries  . PERIPHERAL VASCULAR CATHETERIZATION N/A 07/21/2014   Procedure: Abdominal Aortogram w/Lower Extremity;  Surgeon: Nada Libman, MD;  Location: [facility] INVASIVE CV LAB;  Service: Cardiovascular;  Laterality: N/A;  . PERIPHERAL VASCULAR  CATHETERIZATION N/A 11/23/2015   Procedure: Abdominal Aortogram w/Lower Extremity;  Surgeon: Nada Libman, MD;  Location: [facility] INVASIVE CV LAB;  Service: Cardiovascular;  Laterality: N/A;  . PERIPHERAL VASCULAR CATHETERIZATION  11/23/2015   Procedure: Peripheral Vascular Intervention;  Surgeon: Nada Libman, MD;  Location: [facility] INVASIVE CV LAB;  Service: Cardiovascular;;  Failed PVI of AT and Peroneal on left side  . SUBTOTAL COLECTOMY  2010   for stricturing from bouts of diverticulitis along with hx recurrent diverticular bleeds.  the surgery was a total abdominal colec  . TONSILLECTOMY    . VASCULAR SURGERY      OB History    No data available       Home Medications    Prior to Admission medications   Medication Sig Start Date End Date Taking? Authorizing Provider  Cholecalciferol (VITAMIN D3) 1000 UNITS CAPS Take 1,000 Units by mouth daily.    Yes [provider]  clopidogrel (PLAVIX) 75 MG tablet Take 1 tablet (75 mg total) by mouth daily. Hold for a week, Then restart. 02/03/16  Yes Albertine Grates, MD  loratadine (CLARITIN) 10 MG tablet Take 10 mg by mouth daily.    Yes [provider]  losartan (COZAAR) 25 MG tablet Take 1 tablet (25 mg total) by mouth daily. 02/03/16  Yes Albertine Grates, MD  Melatonin 300 MCG TABS Take 1 tablet by mouth at bedtime.   Yes [provider]  pantoprazole (PROTONIX) 40 MG tablet Take 1 tablet (40 mg total) by mouth daily. 03/07/16  Yes Unk Lightning, PA    Family History Family History  Problem Relation Age of Onset  . Diabetes Paternal Aunt   . Stroke Father   . Heart disease Father   . Diabetes Father   . Depression Father   . HIV Brother   . Prostate cancer Unknown   . Cancer Sister        unknown type  . Colon cancer Neg Hx     Social History Social History  Substance Use Topics  . Smoking status: Never Smoker  . Smokeless tobacco: Never Used  . Alcohol use No     Allergies   Aspirin and  Aspirin   Review of Systems Review of Systems  Constitutional: Negative for fever.  HENT: Negative for sore throat.   Eyes: Negative for visual disturbance.  Respiratory: Negative for cough and shortness of breath.   Cardiovascular: Negative for chest pain.  Gastrointestinal: Positive for abdominal pain and diarrhea (chronic loose pt). Negative for nausea and vomiting.  Genitourinary: Negative for difficulty urinating.  Musculoskeletal: Negative for back pain and neck pain.  Skin: Negative for rash.  Neurological: Positive for headaches (chronic unchanged). Negative for syncope.     Physical Exam Updated Vital Signs BP (!) 156/61 (BP Location: Left Arm)   Pulse 63   Temp 97.6 F (36.4 C) (Oral)   Resp 16   SpO2 100%   Physical Exam  Constitutional: She is oriented to person, place, and time. She appears well-developed and well-nourished. No distress.  HENT:  Head: Normocephalic and atraumatic.  Eyes: Conjunctivae and EOM are normal.  Neck: Normal range of motion.  Cardiovascular: Normal rate, regular rhythm, normal heart sounds and intact distal pulses.  Exam reveals no gallop and no friction rub.   No murmur heard. Pulmonary/Chest: Effort normal and breath sounds normal. No respiratory distress. She has no wheezes. She has no rales.  Abdominal: Soft. She exhibits no distension. There is tenderness (RUQ worse however reports diffuse tenderness including RLQ, LLQ). There is no guarding.  No CVA tenderness   Musculoskeletal: She exhibits no edema or tenderness.  Neurological: She is alert and oriented to person, place, and time.  Speech abnormality (baseline) stuttering at times, slow speech  Skin: Skin is warm and dry. No rash noted. She is not diaphoretic. No erythema.  Nursing note and vitals reviewed.    ED Treatments / Results  Labs (all labs ordered are listed, but only abnormal results are displayed) Labs Reviewed  CBC WITH DIFFERENTIAL/PLATELET - Abnormal;  Notable for the following:       Result Value   RBC 3.69 (*)    Hemoglobin 10.5 (*)    HCT 31.8 (*)    Lymphs Abs 0.6 (*)    All other components within normal limits  COMPREHENSIVE METABOLIC PANEL - Abnormal; Notable for the following:    Glucose, Bld 124 (*)    BUN 23 (*)    Creatinine, Ser 1.67 (*)    Total Protein 6.0 (*)    Albumin 3.2 (*)    Total Bilirubin 0.2 (*)    GFR calc non Af Amer 28 (*)    GFR calc Af Amer 33 (*)    All other components within normal limits  URINALYSIS, ROUTINE W REFLEX MICROSCOPIC - Abnormal; Notable for the following:    Leukocytes, UA TRACE (*)    Bacteria, UA RARE (*)    Squamous Epithelial / LPF 0-5 (*)    All other components within normal limits  URINE CULTURE  LIPASE, BLOOD  TROPONIN I  GLUCOSE, CAPILLARY  TROPONIN I  TROPONIN I  HEMOGLOBIN A1C  MAGNESIUM  PHOSPHORUS  TSH  COMPREHENSIVE METABOLIC PANEL  CBC    EKG  EKG Interpretation  Date/Time:  Monday September 11 2016 14:50:01 EDT Ventricular Rate:  74 PR Interval:    QRS Duration: 75 QT Interval:  396 QTC Calculation: 440 R Axis:   45 Text Interpretation:  Sinus rhythm No significant change since last tracing Confirmed by Alvira Monday ([id number]) on 09/11/2016 3:21:59 PM       Radiology Ct Abdomen Pelvis Wo Contrast  Result Date: 09/11/2016 CLINICAL DATA:  Abdominal pain and near syncope. EXAM: CT ABDOMEN AND PELVIS WITHOUT CONTRAST TECHNIQUE: Multidetector CT imaging of the abdomen and pelvis was performed following the standard protocol without IV contrast. COMPARISON:  01/30/2016. FINDINGS: Lower chest:  Chronic interstitial changes noted at the lung bases. Hepatobiliary: No focal abnormality in the liver on this study without intravenous contrast. Calcified gallstones again noted. Mild intrahepatic biliary duct dilatation associated with mild fullness of the extrahepatic bile duct, not well demonstrated on this noncontrast exam. Prominence of the portal vein and  attenuation likely related to the adjacent distended bile duct has average attenuation in the portal vein is similar to blood pool in the aorta and superior mesenteric vein. Pancreas: Pancreas is diffusely atrophic. Spleen: No splenomegaly. No focal mass lesion. Adrenals/Urinary Tract: No adrenal nodule or mass. 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 No gross lesion identified in the right kidney. There is mild  fullness of the right intrarenal collecting system. No overt right hydroureter. Urinary bladder unremarkable. Stomach/Bowel: Stomach is markedly distended. Duodenum is normally positioned as is the ligament of Treitz. No small bowel wall thickening. No small bowel dilatation. Patient is status post subtotal colectomy. Rectum is prominently distended with stool and air. Vascular/Lymphatic: There is abdominal aortic atherosclerosis without aneurysm. There is no gastrohepatic or hepatoduodenal ligament lymphadenopathy. No intraperitoneal or retroperitoneal lymphadenopathy. No pelvic sidewall lymphadenopathy. Reproductive: Uterus is surgically absent. There is no adnexal mass. Other: No intraperitoneal free fluid. Musculoskeletal: Bone windows reveal no worrisome lytic or sclerotic osseous lesions. Degenerative changes noted lumbar spine. IMPRESSION: 1. Mild intra and extrahepatic biliary duct distention, new in the interval. Correlation with liver function test may prove helpful. 2. Fairly marked distention of the stomach. No obstructing mass identified. Outlet obstruction or gastroparesis could have this appearance. 3. Cholelithiasis. 4. No small bowel dilatation although the rectum is distended with air and stool in this patient status post subtotal colectomy. 5.  Aortic Atherosclerois (ICD10-170.0) Electronically Signed   By: Kennith Center M.D.   On: 09/11/2016 17:33   Dg Chest 2 View  Result Date: 09/11/2016 CLINICAL DATA:  Near syncopal episode and abdominal pain. EXAM: CHEST  2 VIEW COMPARISON:   08/30/2016 FINDINGS: Normal heart size with aortic atherosclerosis. Mild vascular congestion similar to prior. No effusion or pneumothorax. No pulmonary consolidations. Osteoarthritis of the [facility] and glenohumeral joints bilaterally. No acute nor suspicious osseous abnormalities. No free air beneath the diaphragm. Moderate gaseous distention of the stomach. IMPRESSION: 1. Mild vascular congestion similar to prior without acute pneumonic consolidation, overt pulmonary edema nor pneumothorax. 2. Aortic atherosclerosis. 3. Moderate gaseous distention of the stomach. Electronically Signed   By: Tollie Eth M.D.   On: 09/11/2016 17:27   US Abdomen Limited Ruq  Result Date: 09/11/2016 CLINICAL DATA:  Right upper quadrant pain. EXAM: ULTRASOUND ABDOMEN LIMITED RIGHT UPPER QUADRANT COMPARISON:  None. FINDINGS: Gallbladder: Multiple stones in the gallbladder. Sludge identified as well. The wall thickness is increased measuring 6.9 mm. No Murphy's sign. No pericholecystic fluid. Common bile duct: Diameter: 12.2 mm proximally. Shadowing bowel gas obscures the distal portion. Liver: Intrahepatic ductal dilatation. No focal mass. Portal vein is patent on color Doppler imaging with normal direction of blood flow towards the liver. IMPRESSION: 1. Cholelithiasis, sludge, and wall thickening with no pericholecystic fluid or Murphy's sign. 2. Intra and extrahepatic biliary duct dilatation. An underlying cause is not identified. Recommend an MRCP to better evaluate the common bile duct for choledocholithiasis or obstructing lesion. Electronically Signed   By: Gerome Sam III M.D   On: 09/11/2016 19:48    Procedures Procedures (including critical care time)  Medications Ordered in ED Medications  pantoprazole (PROTONIX) EC tablet 40 mg (not administered)  losartan (COZAAR) tablet 25 mg (not administered)  acetaminophen (TYLENOL) tablet 650 mg (not administered)    Or  acetaminophen (TYLENOL) suppository 650 mg (not  administered)  HYDROcodone-acetaminophen (NORCO/VICODIN) 5-325 MG per tablet 1-2 tablet (not administered)  ondansetron (ZOFRAN) tablet 4 mg (not administered)    Or  ondansetron (ZOFRAN) injection 4 mg (not administered)  insulin aspart (novoLOG) injection 0-9 Units (0 Units Subcutaneous Not Given 09/12/16 0045)  0.9 %  sodium chloride infusion ( Intravenous New Bag/Given 09/12/16 0125)  bisacodyl (DULCOLAX) suppository 10 mg (not administered)  albuterol (PROVENTIL) (2.5 MG/3ML) 0.083% nebulizer solution 2.5 mg (not administered)  guaiFENesin (MUCINEX) 12 hr tablet 600 mg (not administered)  labetalol (NORMODYNE,TRANDATE) injection 10 mg (not  administered)  iopamidol (ISOVUE-300) 61 % injection 30 mL (30 mLs Oral Contrast Given 09/11/16 1611)     Initial Impression / Assessment and Plan / ED Course  I have reviewed the triage vital signs and the nursing notes.  Pertinent labs & imaging results that were available during my care of the patient were reviewed by me and considered in my medical decision making (see chart for details).     81 year old female with a history of dementia, diabetes, hypertension, hyperlipidemia, chronic diastolic CHF, CK D stage III, hysterectomy, CVA, bowel resection, presents with concern for abdominal pain.  Labs shows no leukocytosis, no transaminitis. Patient with dyspnea and dyspnea only when pain was severe, chest x-ray shows no acute findings. EKG without acute findings.   CT abdomen pelvis shows dilated intra and extra hepatic ducts. US shows cholelithiasis, thickening of wall,  No pericholecystic fluid, neg sonographic Murphy's.  Doubt acute cholecystitis given afebrile, no leukocytosis, neg Murphy's sign.  Symptomatic cholelithiasis on differential.  US shows enlarging bile duct from prior, and patient with continuing pain, and while she has normal transaminases, cannot rule out repeat choledocolithiasis. Discussed with [facility] Dr. Russella Dar and general  surgery. Will admit to hospitalist for further care.  Final Clinical Impressions(s) / ED Diagnoses   Final diagnoses:  RUQ abdominal pain    New Prescriptions Current Discharge Medication List       Alvira Monday, MD 09/12/16 [phone]

## 2016-09-11 NOTE — ED Notes (Signed)
Patient placed on bedpan, patient unable to void at this time

## 2016-09-11 NOTE — ED Triage Notes (Signed)
Per [facility] states patient from home c/o abd pain and near syncopal episode per daughter who lives with patient today while patient was sitting in her chair. Vitals 158/78, 78hr, 100% O2, and CBG 211.

## 2016-09-12 ENCOUNTER — Observation Stay (HOSPITAL_COMMUNITY): Payer: Medicare (Managed Care)

## 2016-09-12 ENCOUNTER — Inpatient Hospital Stay (HOSPITAL_COMMUNITY): Payer: Medicare (Managed Care)

## 2016-09-12 DIAGNOSIS — K8010 Calculus of gallbladder with chronic cholecystitis without obstruction: Secondary | ICD-10-CM | POA: Diagnosis present

## 2016-09-12 DIAGNOSIS — Z823 Family history of stroke: Secondary | ICD-10-CM | POA: Diagnosis not present

## 2016-09-12 DIAGNOSIS — I69351 Hemiplegia and hemiparesis following cerebral infarction affecting right dominant side: Secondary | ICD-10-CM | POA: Diagnosis not present

## 2016-09-12 DIAGNOSIS — M7989 Other specified soft tissue disorders: Secondary | ICD-10-CM | POA: Diagnosis present

## 2016-09-12 DIAGNOSIS — N183 Chronic kidney disease, stage 3 (moderate): Secondary | ICD-10-CM | POA: Diagnosis present

## 2016-09-12 DIAGNOSIS — R1011 Right upper quadrant pain: Secondary | ICD-10-CM | POA: Diagnosis present

## 2016-09-12 DIAGNOSIS — Z79899 Other long term (current) drug therapy: Secondary | ICD-10-CM | POA: Diagnosis not present

## 2016-09-12 DIAGNOSIS — Z90710 Acquired absence of both cervix and uterus: Secondary | ICD-10-CM | POA: Diagnosis not present

## 2016-09-12 DIAGNOSIS — I10 Essential (primary) hypertension: Secondary | ICD-10-CM | POA: Diagnosis not present

## 2016-09-12 DIAGNOSIS — R55 Syncope and collapse: Secondary | ICD-10-CM | POA: Diagnosis not present

## 2016-09-12 DIAGNOSIS — Z7902 Long term (current) use of antithrombotics/antiplatelets: Secondary | ICD-10-CM | POA: Diagnosis not present

## 2016-09-12 DIAGNOSIS — E1122 Type 2 diabetes mellitus with diabetic chronic kidney disease: Secondary | ICD-10-CM | POA: Diagnosis present

## 2016-09-12 DIAGNOSIS — E1151 Type 2 diabetes mellitus with diabetic peripheral angiopathy without gangrene: Secondary | ICD-10-CM | POA: Diagnosis present

## 2016-09-12 DIAGNOSIS — F329 Major depressive disorder, single episode, unspecified: Secondary | ICD-10-CM | POA: Diagnosis present

## 2016-09-12 DIAGNOSIS — Z86010 Personal history of colonic polyps: Secondary | ICD-10-CM | POA: Diagnosis not present

## 2016-09-12 DIAGNOSIS — R609 Edema, unspecified: Secondary | ICD-10-CM

## 2016-09-12 DIAGNOSIS — R42 Dizziness and giddiness: Secondary | ICD-10-CM | POA: Diagnosis not present

## 2016-09-12 DIAGNOSIS — F0390 Unspecified dementia without behavioral disturbance: Secondary | ICD-10-CM | POA: Diagnosis present

## 2016-09-12 DIAGNOSIS — L97329 Non-pressure chronic ulcer of left ankle with unspecified severity: Secondary | ICD-10-CM | POA: Diagnosis present

## 2016-09-12 DIAGNOSIS — K862 Cyst of pancreas: Secondary | ICD-10-CM | POA: Diagnosis present

## 2016-09-12 DIAGNOSIS — Z833 Family history of diabetes mellitus: Secondary | ICD-10-CM | POA: Diagnosis not present

## 2016-09-12 DIAGNOSIS — R011 Cardiac murmur, unspecified: Secondary | ICD-10-CM | POA: Diagnosis present

## 2016-09-12 DIAGNOSIS — I130 Hypertensive heart and chronic kidney disease with heart failure and stage 1 through stage 4 chronic kidney disease, or unspecified chronic kidney disease: Secondary | ICD-10-CM | POA: Diagnosis present

## 2016-09-12 DIAGNOSIS — Z8249 Family history of ischemic heart disease and other diseases of the circulatory system: Secondary | ICD-10-CM | POA: Diagnosis not present

## 2016-09-12 DIAGNOSIS — K810 Acute cholecystitis: Secondary | ICD-10-CM | POA: Diagnosis not present

## 2016-09-12 DIAGNOSIS — I69322 Dysarthria following cerebral infarction: Secondary | ICD-10-CM | POA: Diagnosis not present

## 2016-09-12 DIAGNOSIS — E785 Hyperlipidemia, unspecified: Secondary | ICD-10-CM | POA: Diagnosis present

## 2016-09-12 DIAGNOSIS — Z9049 Acquired absence of other specified parts of digestive tract: Secondary | ICD-10-CM | POA: Diagnosis not present

## 2016-09-12 DIAGNOSIS — K529 Noninfective gastroenteritis and colitis, unspecified: Secondary | ICD-10-CM | POA: Diagnosis present

## 2016-09-12 DIAGNOSIS — I5032 Chronic diastolic (congestive) heart failure: Secondary | ICD-10-CM | POA: Diagnosis present

## 2016-09-12 LAB — TROPONIN I: Troponin I: <0.03 ng/mL (ref <0.03)

## 2016-09-12 LAB — TSH: TSH: 1.166 u[IU]/mL (ref 0.350–4.500)

## 2016-09-12 LAB — COMPREHENSIVE METABOLIC PANEL
ALBUMIN: 3.1 g/dL — AB (ref 3.5–5.0)
ALT: 13 U/L — AB (ref 14–54)
ANION GAP: 5 (ref 5–15)
AST: 22 U/L (ref 15–41)
Alkaline Phosphatase: 54 U/L (ref 38–126)
BUN: 20 mg/dL (ref 6–20)
CALCIUM: 8.8 mg/dL — AB (ref 8.9–10.3)
CO2: 26 mmol/L (ref 22–32)
Chloride: 106 mmol/L (ref 101–111)
Creatinine, Ser: 1.53 mg/dL — ABNORMAL HIGH (ref 0.44–1.00)
GFR calc non Af Amer: 31 mL/min — ABNORMAL LOW (ref >60)
GFR, EST AFRICAN AMERICAN: 36 mL/min — AB (ref >60)
Glucose, Bld: 87 mg/dL (ref 65–99)
POTASSIUM: 3.8 mmol/L (ref 3.5–5.1)
SODIUM: 137 mmol/L (ref 135–145)
Total Bilirubin: 0.7 mg/dL (ref 0.3–1.2)
Total Protein: 5.8 g/dL — ABNORMAL LOW (ref 6.5–8.1)

## 2016-09-12 LAB — CBC
HEMATOCRIT: 33.4 % — AB (ref 36.0–46.0)
HEMOGLOBIN: 11 g/dL — AB (ref 12.0–15.0)
MCH: 27.5 pg (ref 26.0–34.0)
MCHC: 32.9 g/dL (ref 30.0–36.0)
MCV: 83.5 fL (ref 78.0–100.0)
Platelets: 180 10*3/uL (ref 150–400)
RBC: 4 MIL/uL (ref 3.87–5.11)
RDW: 14.5 % (ref 11.5–15.5)
WBC: 5 10*3/uL (ref 4.0–10.5)

## 2016-09-12 LAB — HEMOGLOBIN A1C
HEMOGLOBIN A1C: 5.8 % — AB (ref 4.8–5.6)
Mean Plasma Glucose: 119.76 mg/dL

## 2016-09-12 LAB — MAGNESIUM: Magnesium: 2 mg/dL (ref 1.7–2.4)

## 2016-09-12 LAB — GLUCOSE, CAPILLARY
GLUCOSE-CAPILLARY: 78 mg/dL (ref 65–99)
GLUCOSE-CAPILLARY: 58 mg/dL — AB (ref 65–99)
GLUCOSE-CAPILLARY: 81 mg/dL (ref 65–99)
GLUCOSE-CAPILLARY: 132 mg/dL — AB (ref 65–99)
Glucose-Capillary: 75 mg/dL (ref 65–99)
Glucose-Capillary: 99 mg/dL (ref 65–99)
Glucose-Capillary: 133 mg/dL — ABNORMAL HIGH (ref 65–99)

## 2016-09-12 LAB — PHOSPHORUS: PHOSPHORUS: 3.4 mg/dL (ref 2.5–4.6)

## 2016-09-12 MED ORDER — BISACODYL 10 MG RE SUPP: 10.0000 mg | Freq: Every day | RECTAL | Status: DC | PRN

## 2016-09-12 MED ORDER — LABETALOL HCL 5 MG/ML IV SOLN
10.0000 mg | INTRAVENOUS | Status: DC | PRN
  Filled 2016-09-12: qty 4

## 2016-09-12 MED ORDER — PANTOPRAZOLE SODIUM 40 MG PO TBEC
40.0000 mg | DELAYED_RELEASE_TABLET | Freq: Every day | ORAL | Status: DC
  Administered 2016-09-12 – 2016-09-16 (×4): 40 mg via ORAL
  Filled 2016-09-12 (×4): qty 1

## 2016-09-12 MED ORDER — DEXTROSE 50 % IV SOLN
INTRAVENOUS | Status: AC
  Administered 2016-09-12: 25 mL
  Filled 2016-09-12: qty 50

## 2016-09-12 MED ORDER — ONDANSETRON HCL 4 MG/2ML IJ SOLN
4.0000 mg | Freq: Four times a day (QID) | INTRAMUSCULAR | Status: DC | PRN
  Administered 2016-09-15: 4 mg via INTRAVENOUS

## 2016-09-12 MED ORDER — ACETAMINOPHEN 325 MG PO TABS: 650.0000 mg | ORAL_TABLET | Freq: Four times a day (QID) | ORAL | Status: DC | PRN

## 2016-09-12 MED ORDER — LOSARTAN POTASSIUM 50 MG PO TABS
25.0000 mg | ORAL_TABLET | Freq: Every day | ORAL | Status: DC
  Administered 2016-09-12: 25 mg via ORAL
  Filled 2016-09-12: qty 1

## 2016-09-12 MED ORDER — ACETAMINOPHEN 650 MG RE SUPP: 650.0000 mg | Freq: Four times a day (QID) | RECTAL | Status: DC | PRN

## 2016-09-12 MED ORDER — ALBUTEROL SULFATE (2.5 MG/3ML) 0.083% IN NEBU: 2.5000 mg | INHALATION_SOLUTION | RESPIRATORY_TRACT | Status: DC | PRN

## 2016-09-12 MED ORDER — MORPHINE SULFATE (PF) 2 MG/ML IV SOLN: 2.0000 mg | INTRAVENOUS | Status: DC | PRN

## 2016-09-12 MED ORDER — ONDANSETRON HCL 4 MG PO TABS: 4.0000 mg | ORAL_TABLET | Freq: Four times a day (QID) | ORAL | Status: DC | PRN

## 2016-09-12 MED ORDER — GUAIFENESIN ER 600 MG PO TB12
600.0000 mg | ORAL_TABLET | Freq: Two times a day (BID) | ORAL | Status: DC
  Administered 2016-09-12 – 2016-09-16 (×8): 600 mg via ORAL
  Filled 2016-09-12 (×8): qty 1

## 2016-09-12 MED ORDER — HYDROCODONE-ACETAMINOPHEN 5-325 MG PO TABS
1.0000 | ORAL_TABLET | ORAL | Status: DC | PRN
Start: 2016-09-12 — End: 2016-09-16
  Administered 2016-09-14: 1 via ORAL
  Filled 2016-09-12: qty 1

## 2016-09-12 MED ORDER — SODIUM CHLORIDE 0.9 % IV SOLN
INTRAVENOUS | Status: AC
  Administered 2016-09-12: 01:00:00 via INTRAVENOUS

## 2016-09-12 MED ORDER — INSULIN ASPART 100 UNIT/ML ~~LOC~~ SOLN
0.0000 [IU] | SUBCUTANEOUS | Status: DC
  Administered 2016-09-13: 1 [IU] via SUBCUTANEOUS
  Administered 2016-09-14: 2 [IU] via SUBCUTANEOUS
  Administered 2016-09-15: 3 [IU] via SUBCUTANEOUS
  Administered 2016-09-15: 1 [IU] via SUBCUTANEOUS

## 2016-09-12 NOTE — Progress Notes (Signed)
[facility] PROGRESS NOTE    Progress Note  [name]  ZOX:[id number] DOB: Dec 28, 1935 DOA: 09/11/2016 PCP: Jethro Bastos, MD     Brief Narrative:   [name] is an 81 y.o. female past medical history of essential hypertension, chronic kidney disease diabetes mellitus and chronic diastolic heart failure comes in for worsening right upper quadrant pain and near syncope that happened on the day of admission  Assessment/Plan:   RUQ abdominal pain: LFTs are normal.She is significantly tender in the right upper quadrant with Murphy sign positive., LFTs are normal. But ultrasound and CT scan show intrahepatic and extrahepatic biliary duct dilation Admitting physician discuss with GI who recommended an MRCP which is pending. Keep the patient nothing by mouth continue to hold Plavix. Surgery has been consulted. She remained afebrile with no leukocytosis continue to hold antibiotics.  Chronic diastolic heart failure: Appears euvolemic, hold ACE inhibitor and continue normal saline at 50 mL an hour, as she is on nothing by mouth.  Chronic kidney disease stage  HLD (hyperlipidemia) stage III: With a baseline creatinine 1.5-1.8. hold ACE inhibitor, for possible surgical intervention.  Hyperlipidemia: No changes made to her medication.  Essential hypertension: In the setting of not being able to take her medication due to abdominal pain and nausea. Continue to hold ACE inhibitor.  Pre-syncope: No events on telemetry cardiac biomarkers have been negative 3. Blood pressure seems to be stable without antihypertensive medication. 2-D echo is pending she has no murmurs and physical exam. Orthostatic vitals are negative. Her history is not compatible with orthostasis.  Chronic Bilateral lower extremity swelling: Lower extremity Dopplers ordered by admitting physician  DVT prophylaxis: lovenox Family Communication:none Disposition Plan/Barrier to D/C: Unable to  determined. Code Status:     Code Status Orders        Start     Ordered   09/12/16 0043  Full code  Continuous     09/12/16 0042    Code Status History    Date Active Date Inactive Code Status Order ID Comments User Context   04/30/2016 11:49 PM 05/01/2016  9:45 PM Full Code [id number]  Lorretta Harp, MD ED   01/30/2016  5:11 AM 02/03/2016  3:12 PM Full Code [id number]  Briscoe Deutscher, MD ED   11/23/2015  2:55 PM 11/24/2015  2:23 PM Full Code [id number]  Nada Libman, MD Inpatient   07/21/2014  3:38 PM 07/21/2014 11:12 PM Full Code [id number]  Nada Libman, MD Inpatient        IV Access:    Peripheral IV   Procedures and diagnostic studies:   Ct Abdomen Pelvis Wo Contrast  Result Date: 09/11/2016 CLINICAL DATA:  Abdominal pain and near syncope. EXAM: CT ABDOMEN AND PELVIS WITHOUT CONTRAST TECHNIQUE: Multidetector CT imaging of the abdomen and pelvis was performed following the standard protocol without IV contrast. COMPARISON:  01/30/2016. FINDINGS: Lower chest:  Chronic interstitial changes noted at the lung bases. Hepatobiliary: No focal abnormality in the liver on this study without intravenous contrast. Calcified gallstones again noted. Mild intrahepatic biliary duct dilatation associated with mild fullness of the extrahepatic bile duct, not well demonstrated on this noncontrast exam. Prominence of the portal vein and attenuation likely related to the adjacent distended bile duct has average attenuation in the portal vein is similar to blood pool in the aorta and superior mesenteric vein. Pancreas: Pancreas is diffusely atrophic. Spleen: No splenomegaly. No focal mass lesion. Adrenals/Urinary Tract: No adrenal nodule or mass. 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 No gross lesion identified in the right kidney. There is mild fullness of the right intrarenal collecting system. No overt right hydroureter. Urinary bladder unremarkable. Stomach/Bowel: Stomach is markedly distended.  Duodenum is normally positioned as is the ligament of Treitz. No small bowel wall thickening. No small bowel dilatation. Patient is status post subtotal colectomy. Rectum is prominently distended with stool and air. Vascular/Lymphatic: There is abdominal aortic atherosclerosis without aneurysm. There is no gastrohepatic or hepatoduodenal ligament lymphadenopathy. No intraperitoneal or retroperitoneal lymphadenopathy. No pelvic sidewall lymphadenopathy. Reproductive: Uterus is surgically absent. There is no adnexal mass. Other: No intraperitoneal free fluid. Musculoskeletal: Bone windows reveal no worrisome lytic or sclerotic osseous lesions. Degenerative changes noted lumbar spine. IMPRESSION: 1. Mild intra and extrahepatic biliary duct distention, new in the interval. Correlation with liver function test may prove helpful. 2. Fairly marked distention of the stomach. No obstructing mass identified. Outlet obstruction or gastroparesis could have this appearance. 3. Cholelithiasis. 4. No small bowel dilatation although the rectum is distended with air and stool in this patient status post subtotal colectomy. 5.  Aortic Atherosclerois (ICD10-170.0) Electronically Signed   By: Kennith Center M.D.   On: 09/11/2016 17:33   Dg Chest 2 View  Result Date: 09/11/2016 CLINICAL DATA:  Near syncopal episode and abdominal pain. EXAM: CHEST  2 VIEW COMPARISON:  08/30/2016 FINDINGS: Normal heart size with aortic atherosclerosis. Mild vascular congestion similar to prior. No effusion or pneumothorax. No pulmonary consolidations. Osteoarthritis of the [facility] and glenohumeral joints bilaterally. No acute nor suspicious osseous abnormalities. No free air beneath the diaphragm. Moderate gaseous distention of the stomach. IMPRESSION: 1. Mild vascular congestion similar to prior without acute pneumonic consolidation, overt pulmonary edema nor pneumothorax. 2. Aortic atherosclerosis. 3. Moderate gaseous distention of the stomach.  Electronically Signed   By: Tollie Eth M.D.   On: 09/11/2016 17:27   US Abdomen Limited Ruq  Result Date: 09/11/2016 CLINICAL DATA:  Right upper quadrant pain. EXAM: ULTRASOUND ABDOMEN LIMITED RIGHT UPPER QUADRANT COMPARISON:  None. FINDINGS: Gallbladder: Multiple stones in the gallbladder. Sludge identified as well. The wall thickness is increased measuring 6.9 mm. No Murphy's sign. No pericholecystic fluid. Common bile duct: Diameter: 12.2 mm proximally. Shadowing bowel gas obscures the distal portion. Liver: Intrahepatic ductal dilatation. No focal mass. Portal vein is patent on color Doppler imaging with normal direction of blood flow towards the liver. IMPRESSION: 1. Cholelithiasis, sludge, and wall thickening with no pericholecystic fluid or Murphy's sign. 2. Intra and extrahepatic biliary duct dilatation. An underlying cause is not identified. Recommend an MRCP to better evaluate the common bile duct for choledocholithiasis or obstructing lesion. Electronically Signed   By: Gerome Sam III M.D   On: 09/11/2016 19:48     Medical Consultants:    None.  Anti-Infectives:   None  Subjective:    [name]   Objective:    Vitals:   09/12/16 0053 09/12/16 0438 09/12/16 0806 09/12/16 0810  BP: (!) 156/61 117/69    Pulse: 63 67  69  Resp: 16 16    Temp: 97.6 F (36.4 C) 97.7 F (36.5 C) 97.8 F (36.6 C)   TempSrc: Oral Oral    SpO2: 100% 100%    Weight:  57.7 kg (127 lb 3.3 oz)    Height:  5\' 7"  (1.702 m)      Intake/Output Summary (Last 24 hours) at 09/12/16 0845 Last data filed at 09/12/16 0618  Gross per 24 hour  Intake  244.17 ml  Output              550 ml  Net          -305.83 ml   Filed Weights   09/12/16 0438  Weight: 57.7 kg (127 lb 3.3 oz)    Exam: General exam:In no acute distress Respiratory system: Good air movement and clear to auscultation. Cardiovascular system: Regular rate and rhythm with positive S1-S2 no JVD no shooting  murmurs. Gastrointestinal system: Positive bowel sounds with right upper quadrant tenderness exquisitely Murphy sign positive. Nondistended Central nervous system: Awake alert and oriented 3, nonfocal. Extremities: 2+ edema in the lower extremities. Skin: No new rashes. Psychiatry: The 22nd. Normal.   Data Reviewed:    Labs: Basic Metabolic Panel:  Recent Labs Lab 09/11/16 1538 09/12/16 0554  NA 138 137  K 4.2 3.8  CL 105 106  CO2 26 26  GLUCOSE 124* 87  BUN 23* 20  CREATININE 1.67* 1.53*  CALCIUM 8.9 8.8*  MG  --  2.0  PHOS  --  3.4   GFR Estimated Creatinine Clearance: 27.2 mL/min (A) (by C-G formula based on SCr of 1.53 mg/dL (H)). Liver Function Tests:  Recent Labs Lab 09/11/16 1538 09/12/16 0554  AST 25 22  ALT 14 13*  ALKPHOS 63 54  BILITOT 0.2* 0.7  PROT 6.0* 5.8*  ALBUMIN 3.2* 3.1*    Recent Labs Lab 09/11/16 1538  LIPASE 37   No results for input(s): AMMONIA in the last 168 hours. Coagulation profile No results for input(s): INR, PROTIME in the last 168 hours.  CBC:  Recent Labs Lab 09/11/16 1538  WBC 4.7  NEUTROABS 3.6  HGB 10.5*  HCT 31.8*  MCV 86.2  PLT 200   Cardiac Enzymes:  Recent Labs Lab 09/11/16 2306 09/12/16 0554  TROPONINI <0.03 <0.03   BNP (last 3 results) No results for input(s): PROBNP in the last 8760 hours. CBG:  Recent Labs Lab 09/12/16 0139 09/12/16 0437 09/12/16 0739  GLUCAP 75 99 78   D-Dimer: No results for input(s): DDIMER in the last 72 hours. Hgb A1c: No results for input(s): HGBA1C in the last 72 hours. Lipid Profile: No results for input(s): CHOL, HDL, LDLCALC, TRIG, CHOLHDL, LDLDIRECT in the last 72 hours. Thyroid function studies:  Recent Labs  09/12/16 0554  TSH 1.166   Anemia work up: No results for input(s): VITAMINB12, FOLATE, FERRITIN, TIBC, IRON, RETICCTPCT in the last 72 hours. Sepsis Labs:  Recent Labs Lab 09/11/16 1538  WBC 4.7   Microbiology No results found for  this or any previous visit (from the past 240 hour(s)).   Medications:   . guaiFENesin  600 mg Oral BID  . insulin aspart  0-9 Units Subcutaneous Q4H  . losartan  25 mg Oral Daily  . pantoprazole  40 mg Oral Daily   Continuous Infusions: . sodium chloride 50 mL/hr at 09/12/16 0125       LOS: 0 days   Marinda Elk  [facility] Pager [phone]  *Please refer to [email or web address], password [email or web address] to get updated schedule on who will round on this patient, as hospitalists switch teams weekly. If 7PM-7AM, please contact night-coverage at [email or web address], password [email or web address] for any overnight needs.  09/12/2016, 8:45 AM

## 2016-09-12 NOTE — Progress Notes (Signed)
Patient arrived from ED to room 1516. Patient is A&Ox3. 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 Patient also has right side deficit from old CVA. Bilateral 2+ edema noted. Patient has dentures in place. No IV is present. Patient placed on telemetry. Patient oriented to room, call bell, bed use, safety and encourage to call for assistance. Will c/t monitor.

## 2016-09-12 NOTE — Progress Notes (Signed)
Date: September 12, 2016 [facility]/patient is a [facility] patient/she normally lives at home with the daughter if she would need placement please call Sue Lush at [phone] for contracted facility. Clide Dales, [phone]

## 2016-09-12 NOTE — Care Management Note (Signed)
Case Management Note  Patient Details  Name: [name] MRN: [id number] Date of Birth: [birth year]  Subjective/Objective:   abd pain hx of rt. Sided cva and slurred speech.                 Action/Plan: Date:  September 12, 2016 Chart reviewed for concurrent status and case management needs. Will continue to follow patient progress. Discharge Planning: following for needs Expected discharge date: [id number] Marcelle Smiling, BSN, [place], [place]   [phone]  Expected Discharge Date:                  Expected Discharge Plan:  Home/Self Care  In-House Referral:     Discharge planning Services  CM Consult  Post Acute Care Choice:    Choice offered to:     DME Arranged:    DME Agency:     HH Arranged:    HH Agency:     Status of Service:  In process, will continue to follow  If discussed at Long Length of Stay Meetings, dates discussed:    Additional Comments:  Golda Acre, RN 09/12/2016, 8:19 AM

## 2016-09-12 NOTE — Consult Note (Signed)
Referring Provider:  Dr. Adela Glimpse, [facility] Primary Care Physician:  Jethro Bastos, MD Primary Gastroenterologist:  Dr. Rhea Belton  Reason for Consultation:  Right sided abdominal pain, dilated bile ducts  HPI: [name] is a 81 y.o. 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  The pain was severe, nonradiating, nothing made it better.  Was feeling fine without any pain just the day prior to ER visit.  Pain/tenderness is still present but not nearly as severe.  Patient denies nausea, vomiting, chest pain, SOB, fever or chills.  Patient has chronic diarrhea but that is unchanged. Patient had a partial colectomy for recurrent diverticulitis in 2010.  Patient had very similar pain with similar imaging findings back in 01/2016.  At that time she was found to have duodenal ulcers, however.  Apparently patient does not take Plavix according to her daughter.  LFT's are normal.  CT scan performed and showed the following:  IMPRESSION: 1. Mild intra and extrahepatic biliary duct distention, new in the interval. Correlation with liver function test may prove helpful. 2. Fairly marked distention of the stomach. No obstructing mass identified. Outlet obstruction or gastroparesis could have this appearance. 3. Cholelithiasis. 4. No small bowel dilatation although the rectum is distended with air and stool in this patient status post subtotal colectomy. 5.  Aortic Atherosclerois (ICD10-170.0)   Ultrasound also performed and showed the following:  IMPRESSION: 1. Cholelithiasis, sludge, and wall thickening with no pericholecystic fluid or Murphy's sign. 2. Intra and extrahepatic biliary duct dilatation. An underlying cause is not identified. Recommend an MRCP to better evaluate the common bile duct  for choledocholithiasis or obstructing lesion.   EGD by Dr. Rhea Belton 01/2016 showed the following:   - Non-obstructing Schatzki ring. - 4 cm hiatal hernia. - The esophagus was otherwise normal. - Normal mucosa was found in the entire stomach. Biopsied to evaluate for H. Pylori--unremarkable mucus, Hpylori negative. - Ulcerated duodenal mucosa in the bulb and second portion of the duodenum (etiology unlcear query ischemic injury). Biopsied--peptic duodenitis.  Is taking pantoprazole 40 mg daily.  Past Medical History:  Diagnosis Date  . Arthritis    hands  . Asthma   . Chronic diastolic CHF (congestive heart failure) (HCC)   . Chronic kidney disease (CKD), stage III (moderate)   . Dementia   . Depression   . Diabetes mellitus without complication (HCC)   . Diverticulosis   . Hx of adenomatous colonic polyps   . Hyperlipidemia   . Hypertension   . Hypochromic anemia   . Internal hemorrhoids   . Stroke [facility]) May 2009   multiple, last one Nov 2012, 2 strokes with right sided deficits and slurred speech  . Stroke [facility])     Past Surgical History:  Procedure Laterality Date  . ABDOMINAL HYSTERECTOMY    . ABDOMINAL SURGERY    . BOWEL RESECTION    . COLECTOMY    . ESOPHAGOGASTRODUODENOSCOPY N/A 02/01/2016   Procedure: ESOPHAGOGASTRODUODENOSCOPY (EGD);  Surgeon: Beverley Fiedler, MD;  Location: [facility] ENDOSCOPY;  Service: Endoscopy;  Laterality: N/A;  . FLEXIBLE SIGMOIDOSCOPY N/A 05/28/2012   Procedure: FLEXIBLE SIGMOIDOSCOPY;  Surgeon: Hart Carwin, MD;  Location: [facility] ENDOSCOPY;  Service: Endoscopy;  Laterality: N/A;  . OTHER SURGICAL HISTORY     2 ear surgeries  . PERIPHERAL VASCULAR  CATHETERIZATION N/A 07/21/2014   Procedure: Abdominal Aortogram w/Lower Extremity;  Surgeon: Nada Libman, MD;  Location: [facility] INVASIVE CV LAB;  Service: Cardiovascular;  Laterality: N/A;  . PERIPHERAL VASCULAR CATHETERIZATION N/A 11/23/2015   Procedure: Abdominal Aortogram w/Lower Extremity;  Surgeon: Nada Libman, MD;  Location: [facility] INVASIVE CV LAB;  Service: Cardiovascular;  Laterality: N/A;  . PERIPHERAL VASCULAR CATHETERIZATION  11/23/2015   Procedure: Peripheral Vascular Intervention;  Surgeon: Nada Libman, MD;  Location: [facility] INVASIVE CV LAB;  Service: Cardiovascular;;  Failed PVI of AT and Peroneal on left side  . SUBTOTAL COLECTOMY  2010   for stricturing from bouts of diverticulitis along with hx recurrent diverticular bleeds.  the surgery was a total abdominal colec  . TONSILLECTOMY    . VASCULAR SURGERY      Prior to Admission medications   Medication Sig Start Date End Date Taking? Authorizing Provider  Cholecalciferol (VITAMIN D3) 1000 UNITS CAPS Take 1,000 Units by mouth daily.    Yes [provider]  clopidogrel (PLAVIX) 75 MG tablet Take 1 tablet (75 mg total) by mouth daily. Hold for a week, Then restart. 02/03/16  Yes Albertine Grates, MD  loratadine (CLARITIN) 10 MG tablet Take 10 mg by mouth daily.    Yes [provider]  losartan (COZAAR) 25 MG tablet Take 1 tablet (25 mg total) by mouth daily. 02/03/16  Yes Albertine Grates, MD  Melatonin 300 MCG TABS Take 1 tablet by mouth at bedtime.   Yes [provider]  pantoprazole (PROTONIX) 40 MG tablet Take 1 tablet (40 mg total) by mouth daily. 03/07/16  Yes Unk Lightning, PA    Current Facility-Administered Medications  Medication Dose Route Frequency Provider Last Rate Last Dose  . acetaminophen (TYLENOL) tablet 650 mg  650 mg Oral Q6H PRN Therisa Doyne, MD       Or  . acetaminophen (TYLENOL) suppository 650 mg  650 mg Rectal Q6H PRN Doutova, Anastassia, MD      . albuterol (PROVENTIL) (2.5 MG/3ML) 0.083% nebulizer solution 2.5 mg  2.5 mg Nebulization Q2H PRN Doutova, Anastassia, MD      . bisacodyl (DULCOLAX) suppository 10 mg  10 mg Rectal Daily PRN Doutova, Anastassia, MD      . guaiFENesin (MUCINEX) 12 hr tablet 600 mg  600 mg Oral BID Therisa Doyne, MD   600 mg at 09/12/16 0907  .  HYDROcodone-acetaminophen (NORCO/VICODIN) 5-325 MG per tablet 1-2 tablet  1-2 tablet Oral Q4H PRN Doutova, Anastassia, MD      . insulin aspart (novoLOG) injection 0-9 Units  0-9 Units Subcutaneous Q4H Doutova, Anastassia, MD      . labetalol (NORMODYNE,TRANDATE) injection 10 mg  10 mg Intravenous Q2H PRN Doutova, Anastassia, MD      . morphine 2 MG/ML injection 2 mg  2 mg Intravenous Q3H PRN Doutova, Anastassia, MD      . ondansetron (ZOFRAN) tablet 4 mg  4 mg Oral Q6H PRN Doutova, Anastassia, MD       Or  . ondansetron (ZOFRAN) injection 4 mg  4 mg Intravenous Q6H PRN Doutova, Anastassia, MD      . pantoprazole (PROTONIX) EC tablet 40 mg  40 mg Oral Daily Doutova, Anastassia, MD   40 mg at 09/12/16 0907    Allergies as of 09/11/2016 - Review Complete 09/11/2016  Allergen Reaction Noted  . Aspirin Anaphylaxis 08/30/2016  . Aspirin Nausea And Vomiting     Family History  Problem Relation Age of Onset  .  Diabetes Paternal Aunt   . Stroke Father   . Heart disease Father   . Diabetes Father   . Depression Father   . HIV Brother   . Prostate cancer Unknown   . Cancer Sister        unknown type  . Colon cancer Neg Hx     Social History   Social History  . Marital status: Married    Spouse name: N/A  . Number of children: N/A  . Years of education: N/A   Occupational History  . Not on file.   Social History Main Topics  . Smoking status: Never Smoker  . Smokeless tobacco: Never Used  . Alcohol use No  . Drug use: No  . Sexual activity: No   Other Topics Concern  . Not on file   Social History Narrative   ** Merged History Encounter **       Review of Systems: ROS is O/W negative except as mentioned in HPI.  Physical Exam: Vital signs in last 24 hours: Temp:  [97.6 F (36.4 C)-98.8 F (37.1 C)] 97.8 F (36.6 C) (08/21 0806) Pulse Rate:  [63-77] 69 (08/21 0810) Resp:  [15-18] 16 (08/21 0438) BP: (117-185)/(61-76) 117/69 (08/21 0438) SpO2:  [93 %-100 %] 100 %  (08/21 0438) Weight:  [127 lb 3.3 oz (57.7 kg)] 127 lb 3.3 oz (57.7 kg) (08/21 0438) Last BM Date: 09/11/16 General:  Alert, thin black female, pleasant and cooperative in NAD Head:  Normocephalic and atraumatic. Eyes:  Sclera clear, no icterus.  Conjunctiva pink. Ears:  Normal auditory acuity. Mouth:  No deformity or lesions.   Lungs:  Clear throughout to auscultation.  No wheezes, crackles, or rhonchi.  No increased WOB. Heart:  Regular rate and rhythm; no murmurs, clicks, rubs, or gallops.   Abdomen:  Soft, non-distended.  Normal bowel sounds.  Right TTP, initially in McBurney's point and then on re-examination was tender in RUQ. Rectal:  Deferred  Msk:  Symmetrical without gross deformities. Pulses:  Normal pulses noted. Extremities:  Without clubbing or edema. Neurologic:  Alert and oriented x 4;  grossly normal neurologically. Skin:  Intact without significant lesions or rashes. Psych:  Alert and cooperative. Normal mood and affect.  Intake/Output from previous day: 08/20 0701 - 08/21 0700 In: 244.2 [I.V.:244.2] Out: 550 [Urine:550]  Lab Results:  Recent Labs  09/11/16 1538  WBC 4.7  HGB 10.5*  HCT 31.8*  PLT 200   BMET  Recent Labs  09/11/16 1538 09/12/16 0554  NA 138 137  K 4.2 3.8  CL 105 106  CO2 26 26  GLUCOSE 124* 87  BUN 23* 20  CREATININE 1.67* 1.53*  CALCIUM 8.9 8.8*   LFT  Recent Labs  09/12/16 0554  PROT 5.8*  ALBUMIN 3.1*  AST 22  ALT 13*  ALKPHOS 54  BILITOT 0.7   Studies/Results: Ct Abdomen Pelvis Wo Contrast  Result Date: 09/11/2016 CLINICAL DATA:  Abdominal pain and near syncope. EXAM: CT ABDOMEN AND PELVIS WITHOUT CONTRAST TECHNIQUE: Multidetector CT imaging of the abdomen and pelvis was performed following the standard protocol without IV contrast. COMPARISON:  01/30/2016. FINDINGS: Lower chest:  Chronic interstitial changes noted at the lung bases. Hepatobiliary: No focal abnormality in the liver on this study without  intravenous contrast. Calcified gallstones again noted. Mild intrahepatic biliary duct dilatation associated with mild fullness of the extrahepatic bile duct, not well demonstrated on this noncontrast exam. Prominence of the portal vein and attenuation likely related to the adjacent  distended bile duct has average attenuation in the portal vein is similar to blood pool in the aorta and superior mesenteric vein. Pancreas: Pancreas is diffusely atrophic. Spleen: No splenomegaly. No focal mass lesion. Adrenals/Urinary Tract: No adrenal nodule or mass. 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 No gross lesion identified in the right kidney. There is mild fullness of the right intrarenal collecting system. No overt right hydroureter. Urinary bladder unremarkable. Stomach/Bowel: Stomach is markedly distended. Duodenum is normally positioned as is the ligament of Treitz. No small bowel wall thickening. No small bowel dilatation. Patient is status post subtotal colectomy. Rectum is prominently distended with stool and air. Vascular/Lymphatic: There is abdominal aortic atherosclerosis without aneurysm. There is no gastrohepatic or hepatoduodenal ligament lymphadenopathy. No intraperitoneal or retroperitoneal lymphadenopathy. No pelvic sidewall lymphadenopathy. Reproductive: Uterus is surgically absent. There is no adnexal mass. Other: No intraperitoneal free fluid. Musculoskeletal: Bone windows reveal no worrisome lytic or sclerotic osseous lesions. Degenerative changes noted lumbar spine. IMPRESSION: 1. Mild intra and extrahepatic biliary duct distention, new in the interval. Correlation with liver function test may prove helpful. 2. Fairly marked distention of the stomach. No obstructing mass identified. Outlet obstruction or gastroparesis could have this appearance. 3. Cholelithiasis. 4. No small bowel dilatation although the rectum is distended with air and stool in this patient status post subtotal colectomy. 5.   Aortic Atherosclerois (ICD10-170.0) Electronically Signed   By: Kennith Center M.D.   On: 09/11/2016 17:33   Dg Chest 2 View  Result Date: 09/11/2016 CLINICAL DATA:  Near syncopal episode and abdominal pain. EXAM: CHEST  2 VIEW COMPARISON:  08/30/2016 FINDINGS: Normal heart size with aortic atherosclerosis. Mild vascular congestion similar to prior. No effusion or pneumothorax. No pulmonary consolidations. Osteoarthritis of the [facility] and glenohumeral joints bilaterally. No acute nor suspicious osseous abnormalities. No free air beneath the diaphragm. Moderate gaseous distention of the stomach. IMPRESSION: 1. Mild vascular congestion similar to prior without acute pneumonic consolidation, overt pulmonary edema nor pneumothorax. 2. Aortic atherosclerosis. 3. Moderate gaseous distention of the stomach. Electronically Signed   By: Tollie Eth M.D.   On: 09/11/2016 17:27   US Abdomen Limited Ruq  Result Date: 09/11/2016 CLINICAL DATA:  Right upper quadrant pain. EXAM: ULTRASOUND ABDOMEN LIMITED RIGHT UPPER QUADRANT COMPARISON:  None. FINDINGS: Gallbladder: Multiple stones in the gallbladder. Sludge identified as well. The wall thickness is increased measuring 6.9 mm. No Murphy's sign. No pericholecystic fluid. Common bile duct: Diameter: 12.2 mm proximally. Shadowing bowel gas obscures the distal portion. Liver: Intrahepatic ductal dilatation. No focal mass. Portal vein is patent on color Doppler imaging with normal direction of blood flow towards the liver. IMPRESSION: 1. Cholelithiasis, sludge, and wall thickening with no pericholecystic fluid or Murphy's sign. 2. Intra and extrahepatic biliary duct dilatation. An underlying cause is not identified. Recommend an MRCP to better evaluate the common bile duct for choledocholithiasis or obstructing lesion. Electronically Signed   By: Gerome Sam III M.D   On: 09/11/2016 19:48   IMPRESSION:  *81 year old female with acute onset of right sided abdominal pain (?  RUQ vs McBurney's point).  Has gallstones, sludge, and wall thickening on imaging as well as dilated bile ducts.  LFT's are normal.  Was evaluated extensive for these same issues earlier this year and MRI/MRCP did not show any definite cause of these issues.  Sounds like it could be biliary in nature, ? Passing small stones, etc.  ? Recurrent peptic ulcer.  PLAN: *Repeat MRCP has already  been ordered so we will await those results.  Surgery already following as well. *Trend LFT's.  ZEHR, JESSICA D.  09/12/2016, 10:51 AM  Pager number [phone]   ________________________________________________________________________  Cor[facility] MD note:  I personally examined the patient, reviewed the data and agree with the assessment and plan described above.  RUQ pain, tenderness, known gallstones and dilated bile ducts on imaging. Normal LFTs.  Agree with MRI and if no CBD stones are noted, I recommend cholecystectomy with IOC.     Rob Bunting, MD [facility] Pager [phone]

## 2016-09-12 NOTE — Consult Note (Addendum)
[facility] Surgery Consult/Admission Note  [name] 16-Oct-1935  [id number].    Requesting MD: Dr. 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 Patient states she started having pain in her lower right abdomen after breakfast yesterday morning. The pain was severe, nonradiating, nothing made it better. Patient denies nausea, vomiting, chest pain, SOB, fever or chills. Pain has improved since admission. Patient has chronic diarrhea but that is unchanged. Patient had a partial colectomy (actually had sub total colectomy by Dr. Zella Richer) for diverticulitis and 2010, abdominal hysterectomy unknown date, abdominal aortogram with lower extremity 2016. Pt states she is unsure if she is taking Plavix. (apparently she is not on Plavix - though Dr. Bradd Burner called me and said that she is prescribed clopidogrel) Pt lives at [address] with her daughter. Pt states she does not see a heart doctor.   Spoke with daughter today and she informed me that the pt does not see a cardiologist cause the pt has never been diagnosed with CHF. Daughter also states pt does not take plavix.   Ultrasound of abdomen showed cholelithiasis, sludge with wall thickening, without pericholecystic fluid or Murphy's sign. Intra-and extrahepatic biliary duct dilatation. Labs: Creatinine 1.53, all other labs unremarkable including LFTs   ROS:  Review of Systems  Constitutional: Negative for chills, diaphoresis, fever and weight loss.  Respiratory: Negative for cough and sputum production.   Cardiovascular: Negative for chest pain.  Gastrointestinal: Positive for abdominal pain and diarrhea (chronic). Negative for blood in  stool, constipation, nausea and vomiting.  Neurological: Negative for dizziness and loss of consciousness.  All other systems reviewed and are negative.    Family History  Problem Relation Age of Onset  . Diabetes Paternal Aunt   . Stroke Father   . Heart disease Father   . Diabetes Father   . Depression Father   . HIV Brother   . Prostate cancer Unknown   . Cancer Sister        unknown type  . Colon cancer Neg Hx     Past Medical History:  Diagnosis Date  . Arthritis    hands  . Asthma   . Chronic diastolic CHF (congestive heart failure) ([place])   . Chronic kidney disease (CKD), stage III (moderate)   . Dementia   . Depression   . Diabetes mellitus without complication ([place])   . Diverticulosis   . Hx of adenomatous colonic polyps   . Hyperlipidemia   . Hypertension   . Hypochromic anemia   . Internal hemorrhoids   . Stroke [facility]) May 2009   multiple, last one Nov 2012, 2 strokes with right sided deficits and slurred speech  . Stroke [facility])     Past Surgical History:  Procedure Laterality Date  . ABDOMINAL HYSTERECTOMY    . ABDOMINAL SURGERY    . BOWEL RESECTION    . COLECTOMY    . ESOPHAGOGASTRODUODENOSCOPY N/A 02/01/2016   Procedure: ESOPHAGOGASTRODUODENOSCOPY (EGD);  Surgeon: Jerene Bears, MD;  Location[facility] ENDOSCOPY;  Service: Endoscopy;  Laterality: N/A;  . FLEXIBLE SIGMOIDOSCOPY N/A 05/28/2012   Procedure: FLEXIBLE SIGMOIDOSCOPY;  Surgeon: Lafayette Dragon, MD;  Location: [facility] ENDOSCOPY;  Service: Endoscopy;  Laterality: N/A;  . OTHER SURGICAL HISTORY  2 ear surgeries  . PERIPHERAL VASCULAR CATHETERIZATION N/A 07/21/2014   Procedure: Abdominal Aortogram w/Lower Extremity;  Surgeon: Serafina Mitchell, MD;  Location: [place] CV LAB;  Service: Cardiovascular;  Laterality: N/A;  . PERIPHERAL VASCULAR CATHETERIZATION N/A 11/23/2015   Procedure: Abdominal Aortogram w/Lower Extremity;  Surgeon: Serafina Mitchell, MD;  Location: [place] CV LAB;  Service: Cardiovascular;   Laterality: N/A;  . PERIPHERAL VASCULAR CATHETERIZATION  11/23/2015   Procedure: Peripheral Vascular Intervention;  Surgeon: Serafina Mitchell, MD;  Location: [place] CV LAB;  Service: Cardiovascular;;  Failed PVI of AT and Peroneal on left side  . SUBTOTAL COLECTOMY  2010   for stricturing from bouts of diverticulitis along with hx recurrent diverticular bleeds.  the surgery was a total abdominal colec  . TONSILLECTOMY    . VASCULAR SURGERY      Social History:  reports that she has never smoked. She has never used smokeless tobacco. She reports that she does not drink alcohol or use drugs.  Allergies:  Allergies  Allergen Reactions  . Aspirin Anaphylaxis  . Aspirin Nausea And Vomiting    Hurts stomach    Medications Prior to Admission  Medication Sig Dispense Refill  . Cholecalciferol (VITAMIN D3) 1000 UNITS CAPS Take 1,000 Units by mouth daily.     . clopidogrel (PLAVIX) 75 MG tablet Take 1 tablet (75 mg total) by mouth daily. Hold for a week, Then restart. 30 tablet 0  . loratadine (CLARITIN) 10 MG tablet Take 10 mg by mouth daily.     Marland Kitchen losartan (COZAAR) 25 MG tablet Take 1 tablet (25 mg total) by mouth daily. 30 tablet 0  . Melatonin 300 MCG TABS Take 1 tablet by mouth at bedtime.    . pantoprazole (PROTONIX) 40 MG tablet Take 1 tablet (40 mg total) by mouth daily. 30 tablet 11    Blood pressure 117/69, pulse 69, temperature 97.8 F (36.6 C), resp. rate 16, height '5\' 7"'  (1.702 m), weight 127 lb 3.3 oz (57.7 kg), SpO2 100 %.  Physical Exam  Constitutional: She is oriented to person, place, and time. Vital signs are normal. No distress.  Thin elderly woman in no acute distress  HENT:  Head: Normocephalic and atraumatic.  Nose: Nose normal.  Mouth/Throat: Oropharynx is clear and moist. No oropharyngeal exudate.  Eyes: Pupils are equal, round, and reactive to light. Conjunctivae are normal. Right eye exhibits no discharge. Left eye exhibits no discharge. No scleral  icterus.  Neck: Normal range of motion. Neck supple. No tracheal deviation present. No thyromegaly present.  Cardiovascular: Normal rate, regular rhythm, normal heart sounds and intact distal pulses.  Exam reveals no gallop and no friction rub.   No murmur heard. Pulses:      Radial pulses are 2+ on the right side, and 2+ on the left side.  Pulmonary/Chest: Effort normal and breath sounds normal. No respiratory distress. She has no wheezes. She has no rhonchi. She has no rales.  Abdominal: Soft. Bowel sounds are normal. She exhibits no distension and no mass. There is no hepatosplenomegaly. There is tenderness. There is no rebound, no guarding, no tenderness at McBurney's point and negative Murphy's sign.    Well-healed vertical midline scar Mild TTP and right hemiabdomen, area noted in blue, no TTP in RUQ  Musculoskeletal: She exhibits no edema or tenderness.  Neurological: She is alert and oriented to person, place, and time.  Skin: Skin is warm and dry. No rash noted. She is not diaphoretic.  Psychiatric: Mood and affect normal.  Nursing note and vitals reviewed.   Results for orders placed or performed during the hospital encounter of 09/11/16 (from the past 48 hour(s))  CBC with Differential     Status: Abnormal   Collection Time: 09/11/16  3:38 PM  Result Value Ref Range   WBC 4.7 4.0 - 10.5 K/uL   RBC 3.69 (L) 3.87 - 5.11 MIL/uL   Hemoglobin 10.5 (L) 12.0 - 15.0 g/dL   HCT 31.8 (L) 36.0 - 46.0 %   MCV 86.2 78.0 - 100.0 fL   MCH 28.5 26.0 - 34.0 pg   MCHC 33.0 30.0 - 36.0 g/dL   RDW 14.8 11.5 - 15.5 %   Platelets 200 150 - 400 K/uL   Neutrophils Relative % 77 %   Neutro Abs 3.6 1.7 - 7.7 K/uL   Lymphocytes Relative 13 %   Lymphs Abs 0.6 (L) 0.7 - 4.0 K/uL   Monocytes Relative 9 %   Monocytes Absolute 0.4 0.1 - 1.0 K/uL   Eosinophils Relative 1 %   Eosinophils Absolute 0.0 0.0 - 0.7 K/uL   Basophils Relative 0 %   Basophils Absolute 0.0 0.0 - 0.1 K/uL  Comprehensive  metabolic panel     Status: Abnormal   Collection Time: 09/11/16  3:38 PM  Result Value Ref Range   Sodium 138 135 - 145 mmol/L   Potassium 4.2 3.5 - 5.1 mmol/L   Chloride 105 101 - 111 mmol/L   CO2 26 22 - 32 mmol/L   Glucose, Bld 124 (H) 65 - 99 mg/dL   BUN 23 (H) 6 - 20 mg/dL   Creatinine, Ser 1.67 (H) 0.44 - 1.00 mg/dL   Calcium 8.9 8.9 - 10.3 mg/dL   Total Protein 6.0 (L) 6.5 - 8.1 g/dL   Albumin 3.2 (L) 3.5 - 5.0 g/dL   AST 25 15 - 41 U/L   ALT 14 14 - 54 U/L   Alkaline Phosphatase 63 38 - 126 U/L   Total Bilirubin 0.2 (L) 0.3 - 1.2 mg/dL   GFR calc non Af Amer 28 (L) >60 mL/min   GFR calc Af Amer 33 (L) >60 mL/min    Comment: (NOTE) The eGFR has been calculated using the CKD EPI equation. This calculation has not been validated in all clinical situations. eGFR's persistently <60 mL/min signify possible Chronic Kidney Disease.    Anion gap 7 5 - 15  Lipase, blood     Status: None   Collection Time: 09/11/16  3:38 PM  Result Value Ref Range   Lipase 37 11 - 51 U/L  Urinalysis, Routine w reflex microscopic     Status: Abnormal   Collection Time: 09/11/16  4:08 PM  Result Value Ref Range   Color, Urine YELLOW YELLOW   APPearance CLEAR CLEAR   Specific Gravity, Urine 1.008 1.005 - 1.030   pH 5.0 5.0 - 8.0   Glucose, UA NEGATIVE NEGATIVE mg/dL   Hgb urine dipstick NEGATIVE NEGATIVE   Bilirubin Urine NEGATIVE NEGATIVE   Ketones, ur NEGATIVE NEGATIVE mg/dL   Protein, ur NEGATIVE NEGATIVE mg/dL   Nitrite NEGATIVE NEGATIVE   Leukocytes, UA TRACE (A) NEGATIVE   RBC / HPF 0-5 0 - 5 RBC/hpf   WBC, UA 0-5 0 - 5 WBC/hpf   Bacteria, UA RARE (A) NONE SEEN   Squamous Epithelial / LPF 0-5 (A) NONE SEEN   Mucus PRESENT    Hyaline Casts, UA PRESENT   Troponin I (q 6hr x 3)  Status: None   Collection Time: 09/11/16 11:06 PM  Result Value Ref Range   Troponin I <0.03 <0.03 ng/mL  Glucose, capillary     Status: None   Collection Time: 09/12/16  1:39 AM  Result Value Ref  Range   Glucose-Capillary 75 65 - 99 mg/dL  Glucose, capillary     Status: None   Collection Time: 09/12/16  4:37 AM  Result Value Ref Range   Glucose-Capillary 99 65 - 99 mg/dL  Troponin I (q 6hr x 3)     Status: None   Collection Time: 09/12/16  5:54 AM  Result Value Ref Range   Troponin I <0.03 <0.03 ng/mL  Magnesium     Status: None   Collection Time: 09/12/16  5:54 AM  Result Value Ref Range   Magnesium 2.0 1.7 - 2.4 mg/dL  Phosphorus     Status: None   Collection Time: 09/12/16  5:54 AM  Result Value Ref Range   Phosphorus 3.4 2.5 - 4.6 mg/dL  TSH     Status: None   Collection Time: 09/12/16  5:54 AM  Result Value Ref Range   TSH 1.166 0.350 - 4.500 uIU/mL    Comment: Performed by a 3rd Generation assay with a functional sensitivity of <=0.01 uIU/mL.  Comprehensive metabolic panel     Status: Abnormal   Collection Time: 09/12/16  5:54 AM  Result Value Ref Range   Sodium 137 135 - 145 mmol/L   Potassium 3.8 3.5 - 5.1 mmol/L   Chloride 106 101 - 111 mmol/L   CO2 26 22 - 32 mmol/L   Glucose, Bld 87 65 - 99 mg/dL   BUN 20 6 - 20 mg/dL   Creatinine, Ser 1.53 (H) 0.44 - 1.00 mg/dL   Calcium 8.8 (L) 8.9 - 10.3 mg/dL   Total Protein 5.8 (L) 6.5 - 8.1 g/dL   Albumin 3.1 (L) 3.5 - 5.0 g/dL   AST 22 15 - 41 U/L   ALT 13 (L) 14 - 54 U/L   Alkaline Phosphatase 54 38 - 126 U/L   Total Bilirubin 0.7 0.3 - 1.2 mg/dL   GFR calc non Af Amer 31 (L) >60 mL/min   GFR calc Af Amer 36 (L) >60 mL/min    Comment: (NOTE) The eGFR has been calculated using the CKD EPI equation. This calculation has not been validated in all clinical situations. eGFR's persistently <60 mL/min signify possible Chronic Kidney Disease.    Anion gap 5 5 - 15  Glucose, capillary     Status: None   Collection Time: 09/12/16  7:39 AM  Result Value Ref Range   Glucose-Capillary 78 65 - 99 mg/dL   Ct Abdomen Pelvis Wo Contrast  Result Date: 09/11/2016 CLINICAL DATA:  Abdominal pain and near syncope. EXAM: CT  ABDOMEN AND PELVIS WITHOUT CONTRAST TECHNIQUE: Multidetector CT imaging of the abdomen and pelvis was performed following the standard protocol without IV contrast. COMPARISON:  01/30/2016. FINDINGS: Lower chest:  Chronic interstitial changes noted at the lung bases. Hepatobiliary: No focal abnormality in the liver on this study without intravenous contrast. Calcified gallstones again noted. Mild intrahepatic biliary duct dilatation associated with mild fullness of the extrahepatic bile duct, not well demonstrated on this noncontrast exam. Prominence of the portal vein and attenuation likely related to the adjacent distended bile duct has average attenuation in the portal vein is similar to blood pool in the aorta and superior mesenteric vein. Pancreas: Pancreas is diffusely atrophic. Spleen: No splenomegaly. No focal  mass lesion. Adrenals/Urinary Tract: No adrenal nodule or mass. 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 No gross lesion identified in the right kidney. There is mild fullness of the right intrarenal collecting system. No overt right hydroureter. Urinary bladder unremarkable. Stomach/Bowel: Stomach is markedly distended. Duodenum is normally positioned as is the ligament of Treitz. No small bowel wall thickening. No small bowel dilatation. Patient is status post subtotal colectomy. Rectum is prominently distended with stool and air. Vascular/Lymphatic: There is abdominal aortic atherosclerosis without aneurysm. There is no gastrohepatic or hepatoduodenal ligament lymphadenopathy. No intraperitoneal or retroperitoneal lymphadenopathy. No pelvic sidewall lymphadenopathy. Reproductive: Uterus is surgically absent. There is no adnexal mass. Other: No intraperitoneal free fluid. Musculoskeletal: Bone windows reveal no worrisome lytic or sclerotic osseous lesions. Degenerative changes noted lumbar spine. IMPRESSION: 1. Mild intra and extrahepatic biliary duct distention, new in the interval. Correlation  with liver function test may prove helpful. 2. Fairly marked distention of the stomach. No obstructing mass identified. Outlet obstruction or gastroparesis could have this appearance. 3. Cholelithiasis. 4. No small bowel dilatation although the rectum is distended with air and stool in this patient status post subtotal colectomy. 5.  Aortic Atherosclerois (ICD10-170.0) Electronically Signed   By: Misty Stanley M.D.   On: 09/11/2016 17:33   Dg Chest 2 View  Result Date: 09/11/2016 CLINICAL DATA:  Near syncopal episode and abdominal pain. EXAM: CHEST  2 VIEW COMPARISON:  08/30/2016 FINDINGS: Normal heart size with aortic atherosclerosis. Mild vascular congestion similar to prior. No effusion or pneumothorax. No pulmonary consolidations. Osteoarthritis of the [facility] and glenohumeral joints bilaterally. No acute nor suspicious osseous abnormalities. No free air beneath the diaphragm. Moderate gaseous distention of the stomach. IMPRESSION: 1. Mild vascular congestion similar to prior without acute pneumonic consolidation, overt pulmonary edema nor pneumothorax. 2. Aortic atherosclerosis. 3. Moderate gaseous distention of the stomach. Electronically Signed   By: Ashley Royalty M.D.   On: 09/11/2016 17:27   US Abdomen Limited Ruq  Result Date: 09/11/2016 CLINICAL DATA:  Right upper quadrant pain. EXAM: ULTRASOUND ABDOMEN LIMITED RIGHT UPPER QUADRANT COMPARISON:  None. FINDINGS: Gallbladder: Multiple stones in the gallbladder. Sludge identified as well. The wall thickness is increased measuring 6.9 mm. No Murphy's sign. No pericholecystic fluid. Common bile duct: Diameter: 12.2 mm proximally. Shadowing bowel gas obscures the distal portion. Liver: Intrahepatic ductal dilatation. No focal mass. Portal vein is patent on color Doppler imaging with normal direction of blood flow towards the liver. IMPRESSION: 1. Cholelithiasis, sludge, and wall thickening with no pericholecystic fluid or Murphy's sign. 2. Intra and  extrahepatic biliary duct dilatation. An underlying cause is not identified. Recommend an MRCP to better evaluate the common bile duct for choledocholithiasis or obstructing lesion. Electronically Signed   By: Dorise Bullion III M.D   On: 09/11/2016 19:48      Assessment/Plan  History of CVA with right-sided weakness and dysarthria HTN PV CKD DM Diastolic CHF  Cholelithiasis - Likely symptomatic cholelithiasis, afebrile and normal WBC so less likely cholecystits - Pending MRCP to evaluate ductal dilatation and will determine next step once resulted  Thank you for the consult. We will follow this pt.  Kalman Drape, [facility] Surgery 09/12/2016, 8:30 AM Pager: [phone] Consults: [phone] Mon-Fri 7:00 am-4:30 pm Sat-Sun 7:00 am-11:30 am  Agree with above. I discussed with daughter, [name] [id number]). Her MRCP is pending - it will not be done tonight until after 7PM tonight. I spoke with J. Zehr (for Dr. Ardis Hughs). If MRCP  is negative, will go ahead with lap chole.  She is high risk for open surgery. Note - in Jan 2018 - she did have ulcer disease.  She can have full liquids up until midnight.  She needs to be NPO past midnight.  I discussed with the patient and her daughter the indications and risks of gall bladder surgery.  The primary risks of gall bladder surgery include, but are not limited to, bleeding, infection, common bile duct injury, and open surgery.    [addendum:  Dr. Bradd Burner, who works with her at [facility], said that she is prescribed clopidogrel .... So unless this is picked out separately, she is probably getting it]  Alphonsa Overall, MD, [facility] Surgery Pager: [phone] Office phone:  [phone]

## 2016-09-12 NOTE — Progress Notes (Signed)
VASCULAR LAB PRELIMINARY  PRELIMINARY  PRELIMINARY  PRELIMINARY  Right lower extremity venous duplex completed.    Preliminary report:  Right:  No evidence of DVT, superficial thrombosis, or Baker's cyst.  Oluwadamilola Rosamond, RVS 09/12/2016, 12:48 PM

## 2016-09-12 NOTE — Progress Notes (Signed)
VASCULAR LAB PRELIMINARY  PRELIMINARY  PRELIMINARY  PRELIMINARY  Carotid duplex completed.    Preliminary report:  Right :  60-79% internal carotid artery stenosis upper end of scale. Left :  40-59% internal carotid artery stenosis upper end of scale. Bilateral - Vertebral artery flow is antegrade.      Doshie Maggi, RVS 09/12/2016, 12:50 PM

## 2016-09-13 ENCOUNTER — Other Ambulatory Visit (HOSPITAL_COMMUNITY): Payer: Medicare (Managed Care)

## 2016-09-13 LAB — URINE CULTURE

## 2016-09-13 LAB — GLUCOSE, CAPILLARY
GLUCOSE-CAPILLARY: 145 mg/dL — AB (ref 65–99)
GLUCOSE-CAPILLARY: 61 mg/dL — AB (ref 65–99)
GLUCOSE-CAPILLARY: 90 mg/dL (ref 65–99)
GLUCOSE-CAPILLARY: 94 mg/dL (ref 65–99)
Glucose-Capillary: 92 mg/dL (ref 65–99)
Glucose-Capillary: 86 mg/dL (ref 65–99)
Glucose-Capillary: 121 mg/dL — ABNORMAL HIGH (ref 65–99)

## 2016-09-13 MED ORDER — DEXTROSE 50 % IV SOLN
INTRAVENOUS | Status: AC
  Filled 2016-09-13: qty 50

## 2016-09-13 NOTE — Progress Notes (Signed)
[facility]/Trauma Progress Note      Assessment/Plan History of CVA with right-sided weakness and dysarthria HTN PV CKD DM Diastolic CHF  Cholelithiasis - Likely symptomatic cholelithiasis, afebrile and normal WBC so less likely cholecystits - MRCP showed moderate enlargement of a septated cystic lesion in the pancreatic head. Possibly a benign cystic lesion or a intraductal papillary mucinous neoplasm. This is not likely the source of her pain. GI following and recommend a repeat MRCP in 1 year.  FEN: fulls VTE: SCD's ID: none Follow up: TBD  DISPO: will hold off on cholecystectomy until likely Friday since pt took a dose of plavix on Monday. GI following and appreciate their assistance.   Agree with above. Will plan lap chole on Friday, 8/24.  Because of prior abdominal surgery - there is an increased risk of open surgery.  She has some medical risk - but appears to have symptomatic gallstone/chronic cholecystitis - and will be best managed by cholecystectomy.  I discussed with the patient and the daughter, Fadumo Heng [phone]) the indications and risks of gall bladder surgery.  The primary risks of gall bladder surgery include, but are not limited to, bleeding, infection, common bile duct injury, and open surgery.  Because of her age, stroke, other medical problems, Ms. Kolodny carries a higher risk for surgery.  But I think surgery will be the best management plan.  The patient and daughter agree. DN    LOS: 1 day    Subjective:  CC: hungry  No abdominal pain. No nausea or vomiting. No family at bedside.   Objective: Vital signs in last 24 hours: Temp:  [97.9 F (36.6 C)-98.3 F (36.8 C)] 98.3 F (36.8 C) (08/22 0422) Pulse Rate:  [63-69] 69 (08/22 0422) Resp:  [16-18] 18 (08/22 0422) BP: (117-175)/(60-72) 141/69 (08/22 0422) SpO2:  [100 %] 100 % (08/22 0422) Last BM Date: 09/11/16  Intake/Output from previous day: 08/21 0701 - 08/22  0700 In: -  Out: 1202 [Urine:1201; Stool:1] Intake/Output this shift: No intake/output data recorded.  PE: Gen:  Alert, NAD, pleasant, cooperative, well appearing Pulm:  Rate and effort normal Abd: Soft, not distended, +BS, no HSM, no TTP Skin: no rashes noted, warm and dry   Anti-infectives: Anti-infectives    None      Lab Results:   Recent Labs  09/11/16 1538 09/12/16 1355  WBC 4.7 5.0  HGB 10.5* 11.0*  HCT 31.8* 33.4*  PLT 200 180   BMET  Recent Labs  09/11/16 1538 09/12/16 0554  NA 138 137  K 4.2 3.8  CL 105 106  CO2 26 26  GLUCOSE 124* 87  BUN 23* 20  CREATININE 1.67* 1.53*  CALCIUM 8.9 8.8*   PT/INR No results for input(s): LABPROT, INR in the last 72 hours. CMP     Component Value Date/Time   NA 137 09/12/2016 0554   K 3.8 09/12/2016 0554   CL 106 09/12/2016 0554   CO2 26 09/12/2016 0554   GLUCOSE 87 09/12/2016 0554   BUN 20 09/12/2016 0554   CREATININE 1.53 (H) 09/12/2016 0554   CALCIUM 8.8 (L) 09/12/2016 0554   PROT 5.8 (L) 09/12/2016 0554   ALBUMIN 3.1 (L) 09/12/2016 0554   AST 22 09/12/2016 0554   ALT 13 (L) 09/12/2016 0554   ALKPHOS 54 09/12/2016 0554   BILITOT 0.7 09/12/2016 0554   GFRNONAA 31 (L) 09/12/2016 0554   GFRAA 36 (L) 09/12/2016 0554   Lipase     Component Value Date/Time  LIPASE 37 09/11/2016 1538    Studies/Results: Ct Abdomen Pelvis Wo Contrast  Result Date: 09/11/2016 CLINICAL DATA:  Abdominal pain and near syncope. EXAM: CT ABDOMEN AND PELVIS WITHOUT CONTRAST TECHNIQUE: Multidetector CT imaging of the abdomen and pelvis was performed following the standard protocol without IV contrast. COMPARISON:  01/30/2016. FINDINGS: Lower chest:  Chronic interstitial changes noted at the lung bases. Hepatobiliary: No focal abnormality in the liver on this study without intravenous contrast. Calcified gallstones again noted. Mild intrahepatic biliary duct dilatation associated with mild fullness of the extrahepatic bile duct,  not well demonstrated on this noncontrast exam. Prominence of the portal vein and attenuation likely related to the adjacent distended bile duct has average attenuation in the portal vein is similar to blood pool in the aorta and superior mesenteric vein. Pancreas: Pancreas is diffusely atrophic. Spleen: No splenomegaly. No focal mass lesion. Adrenals/Urinary Tract: No adrenal nodule or mass. 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 No gross lesion identified in the right kidney. There is mild fullness of the right intrarenal collecting system. No overt right hydroureter. Urinary bladder unremarkable. Stomach/Bowel: Stomach is markedly distended. Duodenum is normally positioned as is the ligament of Treitz. No small bowel wall thickening. No small bowel dilatation. Patient is status post subtotal colectomy. Rectum is prominently distended with stool and air. Vascular/Lymphatic: There is abdominal aortic atherosclerosis without aneurysm. There is no gastrohepatic or hepatoduodenal ligament lymphadenopathy. No intraperitoneal or retroperitoneal lymphadenopathy. No pelvic sidewall lymphadenopathy. Reproductive: Uterus is surgically absent. There is no adnexal mass. Other: No intraperitoneal free fluid. Musculoskeletal: Bone windows reveal no worrisome lytic or sclerotic osseous lesions. Degenerative changes noted lumbar spine. IMPRESSION: 1. Mild intra and extrahepatic biliary duct distention, new in the interval. Correlation with liver function test may prove helpful. 2. Fairly marked distention of the stomach. No obstructing mass identified. Outlet obstruction or gastroparesis could have this appearance. 3. Cholelithiasis. 4. No small bowel dilatation although the rectum is distended with air and stool in this patient status post subtotal colectomy. 5.  Aortic Atherosclerois (ICD10-170.0) Electronically Signed   By: Kennith Center M.D.   On: 09/11/2016 17:33   Dg Chest 2 View  Result Date: 09/11/2016 CLINICAL  DATA:  Near syncopal episode and abdominal pain. EXAM: CHEST  2 VIEW COMPARISON:  08/30/2016 FINDINGS: Normal heart size with aortic atherosclerosis. Mild vascular congestion similar to prior. No effusion or pneumothorax. No pulmonary consolidations. 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 No acute nor suspicious osseous abnormalities. No free air beneath the diaphragm. Moderate gaseous distention of the stomach. IMPRESSION: 1. Mild vascular congestion similar to prior without acute pneumonic consolidation, overt pulmonary edema nor pneumothorax. 2. Aortic atherosclerosis. 3. Moderate gaseous distention of the stomach. Electronically Signed   By: Tollie Eth M.D.   On: 09/11/2016 17:27   Mr Abdomen Mrcp Wo Contrast  Result Date: 09/13/2016 CLINICAL DATA:  Cholelithiasis and biliary dilatation EXAM: MRI ABDOMEN WITHOUT CONTRAST  (INCLUDING MRCP) TECHNIQUE: Multiplanar multisequence MR imaging of the abdomen was performed. Heavily T2-weighted images of the biliary and pancreatic ducts were obtained, and three-dimensional MRCP images were rendered by post processing. COMPARISON:  Multiple exams, including 01/30/2016 and ultrasound of 09/09/2016 FINDINGS: Despite efforts by the technologist and patient, motion artifact is present on today's exam and could not be eliminated. This reduces exam sensitivity and specificity. Lower chest: Unremarkable Hepatobiliary: Mild biliary dilatation and the left hepatic duct near the confluence. The common hepatic duct measures 10 mm in diameter in the common  bile duct 7 mm in diameter. New there is considerable narrowing and indistinct signal in the common bile duct as it new extends along the pancreatic head. New enlarging adjacent new septated lesion of the pancreatic head may be contributory. Slightly beaded appearance of the CBD in this vicinity for example on image 39/6, although this irregularity is less striking for example on image 12/8 and is  probably partially attributable to motion artifact. Blunted distal margin of the common bile duct for example on image 8/8, previously the distal CBD had a more conical morphology. Several gallstones are present measuring up to 1.6 cm in diameter. Pancreas: A septated cystic lesion along the pancreatic head measures 2.4 by 2.0 cm on image 19/5, and previously measured 1.7 by 1.8 cm by my measurements. Spleen:  Unremarkable Adrenals/Urinary Tract:  Small left pelvic kidney with non rotation. Stomach/Bowel: Gaseous prominence of the distal sigmoid colon. Vascular/Lymphatic:  Aortoiliac atherosclerotic vascular disease. Other:  No supplemental non-categorized findings. Musculoskeletal: Degenerative endplate findings at L5-S1. IMPRESSION: 1. Moderate enlargement of a septated cystic lesion in the pancreatic head. Current size 2.4 by 2.0 cm. This could be a benign cystic pancreatic lesion or intraductal papillary mucinous neoplasm. Based on current guidelines for patients under 51 years old, and the current size of the lesion, options going forward include a six-month follow up MRI, or EUS/fine-needle aspiration. This recommendation follows ACR consensus guidelines: Management of Incidental Pancreatic Cysts: A White Paper of the ACR Incidental Findings Committee. J Am Coll Radiol 2017;14:911-923. 2. Mild common hepatic duct and left hepatic duct dilatation. There is some indistinctness of the common bile duct in the vicinity of the pancreatic cyst which may be from mild extrinsic narrowing. Slight irregularity on some sequences in this vicinity is probably attributable to motion artifact rather than filling defect. However, the CBD terminates in a somewhat blunted end, rather than the prior conical and. This makes it difficult to exclude a small distal CBD stone in the ampulla. However, given the lack of dilatation of the CBD itself, I am skeptical of choledocholithiasis. 3. Cholelithiasis is present. 4.  Aortic  Atherosclerosis (ICD10-I70.0). 5. Gaseous prominence of the sigmoid colon. 6. Small left pelvic kidney. Electronically Signed   By: Gaylyn Rong M.D.   On: 09/13/2016 08:56   Mr 3d Recon At Scanner  Result Date: 09/13/2016 CLINICAL DATA:  Cholelithiasis and biliary dilatation EXAM: MRI ABDOMEN WITHOUT CONTRAST  (INCLUDING MRCP) TECHNIQUE: Multiplanar multisequence MR imaging of the abdomen was performed. Heavily T2-weighted images of the biliary and pancreatic ducts were obtained, and three-dimensional MRCP images were rendered by post processing. COMPARISON:  Multiple exams, including 01/30/2016 and ultrasound of 09/09/2016 FINDINGS: Despite efforts by the technologist and patient, motion artifact is present on today's exam and could not be eliminated. This reduces exam sensitivity and specificity. Lower chest: Unremarkable Hepatobiliary: Mild biliary dilatation and the left hepatic duct near the confluence. The common hepatic duct measures 10 mm in diameter in the common bile duct 7 mm in diameter. New there is considerable narrowing and indistinct signal in the common bile duct as it new extends along the pancreatic head. New enlarging adjacent new septated lesion of the pancreatic head may be contributory. Slightly beaded appearance of the CBD in this vicinity for example on image 39/6, although this irregularity is less striking for example on image 12/8 and is probably partially attributable to motion artifact. Blunted distal margin of the common bile duct for example on image 8/8, previously the distal CBD had  a more conical morphology. Several gallstones are present measuring up to 1.6 cm in diameter. Pancreas: A septated cystic lesion along the pancreatic head measures 2.4 by 2.0 cm on image 19/5, and previously measured 1.7 by 1.8 cm by my measurements. Spleen:  Unremarkable Adrenals/Urinary Tract:  Small left pelvic kidney with non rotation. Stomach/Bowel: Gaseous prominence of the distal  sigmoid colon. Vascular/Lymphatic:  Aortoiliac atherosclerotic vascular disease. Other:  No supplemental non-categorized findings. Musculoskeletal: Degenerative endplate findings at L5-S1. IMPRESSION: 1. Moderate enlargement of a septated cystic lesion in the pancreatic head. Current size 2.4 by 2.0 cm. This could be a benign cystic pancreatic lesion or intraductal papillary mucinous neoplasm. Based on current guidelines for patients under 29 years old, and the current size of the lesion, options going forward include a six-month follow up MRI, or EUS/fine-needle aspiration. This recommendation follows ACR consensus guidelines: Management of Incidental Pancreatic Cysts: A White Paper of the ACR Incidental Findings Committee. J Am Coll Radiol 2017;14:911-923. 2. Mild common hepatic duct and left hepatic duct dilatation. There is some indistinctness of the common bile duct in the vicinity of the pancreatic cyst which may be from mild extrinsic narrowing. Slight irregularity on some sequences in this vicinity is probably attributable to motion artifact rather than filling defect. However, the CBD terminates in a somewhat blunted end, rather than the prior conical and. This makes it difficult to exclude a small distal CBD stone in the ampulla. However, given the lack of dilatation of the CBD itself, I am skeptical of choledocholithiasis. 3. Cholelithiasis is present. 4.  Aortic Atherosclerosis (ICD10-I70.0). 5. Gaseous prominence of the sigmoid colon. 6. Small left pelvic kidney. Electronically Signed   By: Gaylyn Rong M.D.   On: 09/13/2016 08:56   US Abdomen Limited Ruq  Result Date: 09/11/2016 CLINICAL DATA:  Right upper quadrant pain. EXAM: ULTRASOUND ABDOMEN LIMITED RIGHT UPPER QUADRANT COMPARISON:  None. FINDINGS: Gallbladder: Multiple stones in the gallbladder. Sludge identified as well. The wall thickness is increased measuring 6.9 mm. No Murphy's sign. No pericholecystic fluid. Common bile duct:  Diameter: 12.2 mm proximally. Shadowing bowel gas obscures the distal portion. Liver: Intrahepatic ductal dilatation. No focal mass. Portal vein is patent on color Doppler imaging with normal direction of blood flow towards the liver. IMPRESSION: 1. Cholelithiasis, sludge, and wall thickening with no pericholecystic fluid or Murphy's sign. 2. Intra and extrahepatic biliary duct dilatation. An underlying cause is not identified. Recommend an MRCP to better evaluate the common bile duct for choledocholithiasis or obstructing lesion. Electronically Signed   By: Gerome Sam III M.D   On: 09/11/2016 19:48    Jerre Simon , [facility] 09/13/2016, 1:54 PM Pager: [phone] Consults: [phone]  Ovidio Kin, MD, [facility] Pager: [phone] Office phone:  [phone]

## 2016-09-13 NOTE — Progress Notes (Signed)
[facility] Progress Note  Subjective:  Feels pretty good today.  No pain currently.  Objective:  Vital signs in last 24 hours: Temp:  [97.9 F (36.6 C)-98.3 F (36.8 C)] 98.3 F (36.8 C) (08/22 0422) Pulse Rate:  [63-69] 69 (08/22 0422) Resp:  [16-18] 18 (08/22 0422) BP: (117-175)/(60-72) 141/69 (08/22 0422) SpO2:  [100 %] 100 % (08/22 0422) Last BM Date: 09/11/16 General:  Alert, Well-developed, in NAD Heart:  Regular rate and rhythm; no murmurs Pulm:  CTAB.  No increased WOB. Abdomen:  Soft, non-distended.  BS present.  Non-tender.   Extremities:  Without edema. Neurologic:  Alert and oriented x 4;  grossly normal neurologically. Psych:  Alert and cooperative. Normal mood and affect.  Lab Results:  Recent Labs  09/11/16 1538 09/12/16 1355  WBC 4.7 5.0  HGB 10.5* 11.0*  HCT 31.8* 33.4*  PLT 200 180   BMET  Recent Labs  09/11/16 1538 09/12/16 0554  NA 138 137  K 4.2 3.8  CL 105 106  CO2 26 26  GLUCOSE 124* 87  BUN 23* 20  CREATININE 1.67* 1.53*  CALCIUM 8.9 8.8*   LFT  Recent Labs  09/12/16 0554  PROT 5.8*  ALBUMIN 3.1*  AST 22  ALT 13*  ALKPHOS 54  BILITOT 0.7   Ct Abdomen Pelvis Wo Contrast  Result Date: 09/11/2016 CLINICAL DATA:  Abdominal pain and near syncope. EXAM: CT ABDOMEN AND PELVIS WITHOUT CONTRAST TECHNIQUE: Multidetector CT imaging of the abdomen and pelvis was performed following the standard protocol without IV contrast. COMPARISON:  01/30/2016. FINDINGS: Lower chest:  Chronic interstitial changes noted at the lung bases. Hepatobiliary: No focal abnormality in the liver on this study without intravenous contrast. Calcified gallstones again noted. Mild intrahepatic biliary duct dilatation associated with mild fullness of the extrahepatic bile duct, not well demonstrated on this noncontrast exam. Prominence of the portal vein and attenuation likely related to the adjacent distended bile duct has average attenuation in  the portal vein is similar to blood pool in the aorta and superior mesenteric vein. Pancreas: Pancreas is diffusely atrophic. Spleen: No splenomegaly. No focal mass lesion. Adrenals/Urinary Tract: No adrenal nodule or mass. 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 No gross lesion identified in the right kidney. There is mild fullness of the right intrarenal collecting system. No overt right hydroureter. Urinary bladder unremarkable. Stomach/Bowel: Stomach is markedly distended. Duodenum is normally positioned as is the ligament of Treitz. No small bowel wall thickening. No small bowel dilatation. Patient is status post subtotal colectomy. Rectum is prominently distended with stool and air. Vascular/Lymphatic: There is abdominal aortic atherosclerosis without aneurysm. There is no gastrohepatic or hepatoduodenal ligament lymphadenopathy. No intraperitoneal or retroperitoneal lymphadenopathy. No pelvic sidewall lymphadenopathy. Reproductive: Uterus is surgically absent. There is no adnexal mass. Other: No intraperitoneal free fluid. Musculoskeletal: Bone windows reveal no worrisome lytic or sclerotic osseous lesions. Degenerative changes noted lumbar spine. IMPRESSION: 1. Mild intra and extrahepatic biliary duct distention, new in the interval. Correlation with liver function test may prove helpful. 2. Fairly marked distention of the stomach. No obstructing mass identified. Outlet obstruction or gastroparesis could have this appearance. 3. Cholelithiasis. 4. No small bowel dilatation although the rectum is distended with air and stool in this patient status post subtotal colectomy. 5.  Aortic Atherosclerois (ICD10-170.0) Electronically Signed   By: Kennith Center M.D.   On: 09/11/2016 17:33   Dg Chest 2 View  Result Date: 09/11/2016 CLINICAL DATA:  Near syncopal  episode and abdominal pain. EXAM: CHEST  2 VIEW COMPARISON:  08/30/2016 FINDINGS: Normal heart size with aortic atherosclerosis. Mild vascular  congestion similar to prior. No effusion or pneumothorax. No pulmonary consolidations. 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 No acute nor suspicious osseous abnormalities. No free air beneath the diaphragm. Moderate gaseous distention of the stomach. IMPRESSION: 1. Mild vascular congestion similar to prior without acute pneumonic consolidation, overt pulmonary edema nor pneumothorax. 2. Aortic atherosclerosis. 3. Moderate gaseous distention of the stomach. Electronically Signed   By: Tollie Eth M.D.   On: 09/11/2016 17:27   Mr Abdomen Mrcp Wo Contrast  Result Date: 09/13/2016 CLINICAL DATA:  Cholelithiasis and biliary dilatation EXAM: MRI ABDOMEN WITHOUT CONTRAST  (INCLUDING MRCP) TECHNIQUE: Multiplanar multisequence MR imaging of the abdomen was performed. Heavily T2-weighted images of the biliary and pancreatic ducts were obtained, and three-dimensional MRCP images were rendered by post processing. COMPARISON:  Multiple exams, including 01/30/2016 and ultrasound of 09/09/2016 FINDINGS: Despite efforts by the technologist and patient, motion artifact is present on today's exam and could not be eliminated. This reduces exam sensitivity and specificity. Lower chest: Unremarkable Hepatobiliary: Mild biliary dilatation and the left hepatic duct near the confluence. The common hepatic duct measures 10 mm in diameter in the common bile duct 7 mm in diameter. New there is considerable narrowing and indistinct signal in the common bile duct as it new extends along the pancreatic head. New enlarging adjacent new septated lesion of the pancreatic head may be contributory. Slightly beaded appearance of the CBD in this vicinity for example on image 39/6, although this irregularity is less striking for example on image 12/8 and is probably partially attributable to motion artifact. Blunted distal margin of the common bile duct for example on image 8/8, previously the distal CBD had a more conical  morphology. Several gallstones are present measuring up to 1.6 cm in diameter. Pancreas: A septated cystic lesion along the pancreatic head measures 2.4 by 2.0 cm on image 19/5, and previously measured 1.7 by 1.8 cm by my measurements. Spleen:  Unremarkable Adrenals/Urinary Tract:  Small left pelvic kidney with non rotation. Stomach/Bowel: Gaseous prominence of the distal sigmoid colon. Vascular/Lymphatic:  Aortoiliac atherosclerotic vascular disease. Other:  No supplemental non-categorized findings. Musculoskeletal: Degenerative endplate findings at L5-S1. IMPRESSION: 1. Moderate enlargement of a septated cystic lesion in the pancreatic head. Current size 2.4 by 2.0 cm. This could be a benign cystic pancreatic lesion or intraductal papillary mucinous neoplasm. Based on current guidelines for patients under 65 years old, and the current size of the lesion, options going forward include a six-month follow up MRI, or EUS/fine-needle aspiration. This recommendation follows ACR consensus guidelines: Management of Incidental Pancreatic Cysts: A White Paper of the ACR Incidental Findings Committee. J Am Coll Radiol 2017;14:911-923. 2. Mild common hepatic duct and left hepatic duct dilatation. There is some indistinctness of the common bile duct in the vicinity of the pancreatic cyst which may be from mild extrinsic narrowing. Slight irregularity on some sequences in this vicinity is probably attributable to motion artifact rather than filling defect. However, the CBD terminates in a somewhat blunted end, rather than the prior conical and. This makes it difficult to exclude a small distal CBD stone in the ampulla. However, given the lack of dilatation of the CBD itself, I am skeptical of choledocholithiasis. 3. Cholelithiasis is present. 4.  Aortic Atherosclerosis (ICD10-I70.0). 5. Gaseous prominence of the sigmoid colon. 6. Small left pelvic kidney. Electronically Signed   By: Zollie Beckers  Ova Freshwater M.D.   On: 09/13/2016  08:56   Mr 3d Recon At Scanner  Result Date: 09/13/2016 CLINICAL DATA:  Cholelithiasis and biliary dilatation EXAM: MRI ABDOMEN WITHOUT CONTRAST  (INCLUDING MRCP) TECHNIQUE: Multiplanar multisequence MR imaging of the abdomen was performed. Heavily T2-weighted images of the biliary and pancreatic ducts were obtained, and three-dimensional MRCP images were rendered by post processing. COMPARISON:  Multiple exams, including 01/30/2016 and ultrasound of 09/09/2016 FINDINGS: Despite efforts by the technologist and patient, motion artifact is present on today's exam and could not be eliminated. This reduces exam sensitivity and specificity. Lower chest: Unremarkable Hepatobiliary: Mild biliary dilatation and the left hepatic duct near the confluence. The common hepatic duct measures 10 mm in diameter in the common bile duct 7 mm in diameter. New there is considerable narrowing and indistinct signal in the common bile duct as it new extends along the pancreatic head. New enlarging adjacent new septated lesion of the pancreatic head may be contributory. Slightly beaded appearance of the CBD in this vicinity for example on image 39/6, although this irregularity is less striking for example on image 12/8 and is probably partially attributable to motion artifact. Blunted distal margin of the common bile duct for example on image 8/8, previously the distal CBD had a more conical morphology. Several gallstones are present measuring up to 1.6 cm in diameter. Pancreas: A septated cystic lesion along the pancreatic head measures 2.4 by 2.0 cm on image 19/5, and previously measured 1.7 by 1.8 cm by my measurements. Spleen:  Unremarkable Adrenals/Urinary Tract:  Small left pelvic kidney with non rotation. Stomach/Bowel: Gaseous prominence of the distal sigmoid colon. Vascular/Lymphatic:  Aortoiliac atherosclerotic vascular disease. Other:  No supplemental non-categorized findings. Musculoskeletal: Degenerative endplate findings  at L5-S1. IMPRESSION: 1. Moderate enlargement of a septated cystic lesion in the pancreatic head. Current size 2.4 by 2.0 cm. This could be a benign cystic pancreatic lesion or intraductal papillary mucinous neoplasm. Based on current guidelines for patients under 13 years old, and the current size of the lesion, options going forward include a six-month follow up MRI, or EUS/fine-needle aspiration. This recommendation follows ACR consensus guidelines: Management of Incidental Pancreatic Cysts: A White Paper of the ACR Incidental Findings Committee. J Am Coll Radiol 2017;14:911-923. 2. Mild common hepatic duct and left hepatic duct dilatation. There is some indistinctness of the common bile duct in the vicinity of the pancreatic cyst which may be from mild extrinsic narrowing. Slight irregularity on some sequences in this vicinity is probably attributable to motion artifact rather than filling defect. However, the CBD terminates in a somewhat blunted end, rather than the prior conical and. This makes it difficult to exclude a small distal CBD stone in the ampulla. However, given the lack of dilatation of the CBD itself, I am skeptical of choledocholithiasis. 3. Cholelithiasis is present. 4.  Aortic Atherosclerosis (ICD10-I70.0). 5. Gaseous prominence of the sigmoid colon. 6. Small left pelvic kidney. Electronically Signed   By: Gaylyn Rong M.D.   On: 09/13/2016 08:56   US Abdomen Limited Ruq  Result Date: 09/11/2016 CLINICAL DATA:  Right upper quadrant pain. EXAM: ULTRASOUND ABDOMEN LIMITED RIGHT UPPER QUADRANT COMPARISON:  None. FINDINGS: Gallbladder: Multiple stones in the gallbladder. Sludge identified as well. The wall thickness is increased measuring 6.9 mm. No Murphy's sign. No pericholecystic fluid. Common bile duct: Diameter: 12.2 mm proximally. Shadowing bowel gas obscures the distal portion. Liver: Intrahepatic ductal dilatation. No focal mass. Portal vein is patent on color Doppler imaging with  normal  direction of blood flow towards the liver. IMPRESSION: 1. Cholelithiasis, sludge, and wall thickening with no pericholecystic fluid or Murphy's sign. 2. Intra and extrahepatic biliary duct dilatation. An underlying cause is not identified. Recommend an MRCP to better evaluate the common bile duct for choledocholithiasis or obstructing lesion. Electronically Signed   By: Gerome Sam III M.D   On: 09/11/2016 19:48   Assessment / Plan: *81 year old female with acute onset of right sided abdominal pain (? RUQ vs McBurney's point).  Has gallstones, sludge, and wall thickening on imaging as well as dilated bile ducts.  LFT's are normal.  Was evaluated extensive for these same issues earlier this year and MRI/MRCP did not show any definite cause of these issues.  Sounds like it could be biliary in nature.  MRCP this admission is equivocal but radiologist is skeptical of CBD stone. *Pancreatic cyst:  Enlarged compared to study earlier this year.  ? Benign cystic lesion vs IPMN.  Dr. Christella Hartigan recommending repeat MRI in one year.  Doubt that this is causing any symptoms.  **We recommend that surgery proceed with cholecystectomy with IOC, which reportedly will not happen until 8/23 or 8/24 because she is in fact on Plavix so surgery is waiting for it to wash out.    LOS: 1 day   ZEHR, JESSICA D.  09/13/2016, 9:13 AM  Pager number [phone]  ________________________________________________________________________  [facility] MD note:  I personally examined the patient, reviewed the data and agree with the assessment and plan described above.  The CBD findings are equivocal, the radiologist overall thinks she does not have CBD stones.  I recommend lap chole with IOC when she is ready after holding plavix.   The pancreatic cyst is incidental, unlikely causing any symptoms and I recommend following 2015 AGA guidelines for this with a repeat MRI in one year.  Please call or page if the [facility] is abnormal.   Otherwise will sign off for now.   Rob Bunting, MD [facility] Pager [phone]

## 2016-09-13 NOTE — Progress Notes (Signed)
PROGRESS NOTE  [name]:[id number] DOB: 20-May-1935 DOA: 09/11/2016 PCP: Jethro Bastos, MD   LOS: 1 day   Brief Narrative / Interim history: [name] is an 81 y.o. female past medical history of essential hypertension, chronic kidney disease diabetes mellitus and chronic diastolic heart failure comes in for worsening right upper quadrant pain and near syncope that happened on the day of admission  Assessment & Plan: Active Problems:   HLD (hyperlipidemia)   Hypertension   Chronic kidney disease   Chronic diastolic CHF (congestive heart failure) (HCC)   RUQ abdominal pain   Postural dizziness with presyncope   RUQ pain    Right upper quadrant abdominal pain/cholelithiasis -MRCP was done this morning, cannot fully exclude choledocholithiasis.  Gastroenterology and general surgery following, appreciate input.  Continue to hold Plavix and keep n.p.o.  Chronic diastolic CHF -Appears euvolemic, hold ACE inhibitor and continue normal saline while she is n.p.o.  Chronic kidney disease stage III -Baseline creatinine 1.5-1.8, currently at baseline.  Continue to monitor  Hypertension -Blood pressure controlled this morning, hold losartan in case she will have surgery  Presyncope -No evidence of telemetry, cardiac markers have been negative 3 -2D echo is pending -Carotid ultrasound 60 - 79% right ICA stenosis, upper end of scale, 40-59% left ICA stenosis.  We will likely need to be seen by vascular surgery as an outpatient  Prior stroke -With right-sided weakness, chronic, stable.  Currently her Plavix is on hold  Pancreatic lesion -MRCP showed a 2.4 x 2 cm septated cystic lesion which could be benign versus intraductal papillary mucinous neoplasm.  GI following, defer biopsy/EUS to gastroenterology team  Chronic lower extremity swelling right more than the left -Venous Doppler without evidence of DVT -Swelling is chronic   DVT prophylaxis: SCDs Code  Status: Full code Family Communication: No family at bedside, discussed with patient Disposition Plan: Home when ready  Consultants:   Gastroenterology  General surgery  Procedures:   None   Antimicrobials:  None    Subjective: - no chest pain, shortness of breath, no nausea or vomiting.  Mild abdominal pain in the right upper quadrant.  Patient very difficult to understand due to prior stroke and slurred speech  Objective: Vitals:   09/12/16 1400 09/12/16 2226 09/13/16 0016 09/13/16 0422  BP: 117/72 (!) 175/70 120/60 (!) 141/69  Pulse: 66 65 63 69  Resp: 16 16 16 18   Temp: 97.9 F (36.6 C) 98.2 F (36.8 C) 98.1 F (36.7 C) 98.3 F (36.8 C)  TempSrc: Oral Oral Oral Oral  SpO2: 100% 100% 100% 100%  Weight:      Height:        Intake/Output Summary (Last 24 hours) at 09/13/16 1112 Last data filed at 09/13/16 0423  Gross per 24 hour  Intake                0 ml  Output             1202 ml  Net            -1202 ml   Filed Weights   09/12/16 0438  Weight: 57.7 kg (127 lb 3.3 oz)    Examination:  Vitals:   09/12/16 1400 09/12/16 2226 09/13/16 0016 09/13/16 0422  BP: 117/72 (!) 175/70 120/60 (!) 141/69  Pulse: 66 65 63 69  Resp: 16 16 16 18   Temp: 97.9 F (36.6 C) 98.2 F (36.8 C) 98.1 F (36.7 C) 98.3 F (36.8 C)  TempSrc: Oral Oral Oral Oral  SpO2: 100% 100% 100% 100%  Weight:      Height:        Constitutional: NAD Eyes:  lids and conjunctivae normal ENMT: Mucous membranes are moist.  Respiratory: clear to auscultation bilaterally, no wheezing, no crackles. Normal respiratory effort. No accessory muscle use.  Cardiovascular: Regular rate and rhythm, no murmurs / rubs / gallops. 2+ LE edema. 2+ pedal pulses.  Abdomen: no tenderness. Bowel sounds positive.  Neurologic: Decreased strength on right side Psychiatric: Normal judgment and insight. Alert and oriented x 3.    Data Reviewed: I have independently reviewed following labs and imaging  studies   CBC:  Recent Labs Lab 09/11/16 1538 09/12/16 1355  WBC 4.7 5.0  NEUTROABS 3.6  --   HGB 10.5* 11.0*  HCT 31.8* 33.4*  MCV 86.2 83.5  PLT 200 180   Basic Metabolic Panel:  Recent Labs Lab 09/11/16 1538 09/12/16 0554  NA 138 137  K 4.2 3.8  CL 105 106  CO2 26 26  GLUCOSE 124* 87  BUN 23* 20  CREATININE 1.67* 1.53*  CALCIUM 8.9 8.8*  MG  --  2.0  PHOS  --  3.4   GFR: Estimated Creatinine Clearance: 27.2 mL/min (A) (by C-G formula based on SCr of 1.53 mg/dL (H)). Liver Function Tests:  Recent Labs Lab 09/11/16 1538 09/12/16 0554  AST 25 22  ALT 14 13*  ALKPHOS 63 54  BILITOT 0.2* 0.7  PROT 6.0* 5.8*  ALBUMIN 3.2* 3.1*    Recent Labs Lab 09/11/16 1538  LIPASE 37   No results for input(s): AMMONIA in the last 168 hours. Coagulation Profile: No results for input(s): INR, PROTIME in the last 168 hours. Cardiac Enzymes:  Recent Labs Lab 09/11/16 2306 09/12/16 0554  TROPONINI <0.03 <0.03   BNP (last 3 results) No results for input(s): PROBNP in the last 8760 hours. HbA1C:  Recent Labs  09/12/16 0554  HGBA1C 5.8*   CBG:  Recent Labs Lab 09/12/16 1721 09/12/16 2232 09/13/16 0021 09/13/16 0425 09/13/16 0740  GLUCAP 81 132* 90 92 86   Lipid Profile: No results for input(s): CHOL, HDL, LDLCALC, TRIG, CHOLHDL, LDLDIRECT in the last 72 hours. Thyroid Function Tests:  Recent Labs  09/12/16 0554  TSH 1.166   Anemia Panel: No results for input(s): VITAMINB12, FOLATE, FERRITIN, TIBC, IRON, RETICCTPCT in the last 72 hours. Urine analysis:    Component Value Date/Time   COLORURINE YELLOW 09/11/2016 1608   APPEARANCEUR CLEAR 09/11/2016 1608   LABSPEC 1.008 09/11/2016 1608   PHURINE 5.0 09/11/2016 1608   GLUCOSEU NEGATIVE 09/11/2016 1608   HGBUR NEGATIVE 09/11/2016 1608   BILIRUBINUR NEGATIVE 09/11/2016 1608   KETONESUR NEGATIVE 09/11/2016 1608   PROTEINUR NEGATIVE 09/11/2016 1608   UROBILINOGEN 0.2 05/27/2012 1416   NITRITE  NEGATIVE 09/11/2016 1608   LEUKOCYTESUR TRACE (A) 09/11/2016 1608   Sepsis Labs: Invalid input(s): PROCALCITONIN, LACTICIDVEN  Recent Results (from the past 240 hour(s))  Urine culture     Status: Abnormal   Collection Time: 09/11/16  4:08 PM  Result Value Ref Range Status   Specimen Description URINE, CLEAN CATCH  Final   Special Requests NONE  Final   Culture MULTIPLE SPECIES PRESENT, SUGGEST RECOLLECTION (A)  Final   Report Status 09/13/2016 FINAL  Final      Radiology Studies: Ct Abdomen Pelvis Wo Contrast  Result Date: 09/11/2016 CLINICAL DATA:  Abdominal pain and near syncope. EXAM: CT ABDOMEN AND PELVIS WITHOUT CONTRAST TECHNIQUE: Multidetector  CT imaging of the abdomen and pelvis was performed following the standard protocol without IV contrast. COMPARISON:  01/30/2016. FINDINGS: Lower chest:  Chronic interstitial changes noted at the lung bases. Hepatobiliary: No focal abnormality in the liver on this study without intravenous contrast. Calcified gallstones again noted. Mild intrahepatic biliary duct dilatation associated with mild fullness of the extrahepatic bile duct, not well demonstrated on this noncontrast exam. Prominence of the portal vein and attenuation likely related to the adjacent distended bile duct has average attenuation in the portal vein is similar to blood pool in the aorta and superior mesenteric vein. Pancreas: Pancreas is diffusely atrophic. Spleen: No splenomegaly. No focal mass lesion. Adrenals/Urinary Tract: No adrenal nodule or mass. 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 No gross lesion identified in the right kidney. There is mild fullness of the right intrarenal collecting system. No overt right hydroureter. Urinary bladder unremarkable. Stomach/Bowel: Stomach is markedly distended. Duodenum is normally positioned as is the ligament of Treitz. No small bowel wall thickening. No small bowel dilatation. Patient is status post subtotal colectomy. Rectum  is prominently distended with stool and air. Vascular/Lymphatic: There is abdominal aortic atherosclerosis without aneurysm. There is no gastrohepatic or hepatoduodenal ligament lymphadenopathy. No intraperitoneal or retroperitoneal lymphadenopathy. No pelvic sidewall lymphadenopathy. Reproductive: Uterus is surgically absent. There is no adnexal mass. Other: No intraperitoneal free fluid. Musculoskeletal: Bone windows reveal no worrisome lytic or sclerotic osseous lesions. Degenerative changes noted lumbar spine. IMPRESSION: 1. Mild intra and extrahepatic biliary duct distention, new in the interval. Correlation with liver function test may prove helpful. 2. Fairly marked distention of the stomach. No obstructing mass identified. Outlet obstruction or gastroparesis could have this appearance. 3. Cholelithiasis. 4. No small bowel dilatation although the rectum is distended with air and stool in this patient status post subtotal colectomy. 5.  Aortic Atherosclerois (ICD10-170.0) Electronically Signed   By: Kennith Center M.D.   On: 09/11/2016 17:33   Dg Chest 2 View  Result Date: 09/11/2016 CLINICAL DATA:  Near syncopal episode and abdominal pain. EXAM: CHEST  2 VIEW COMPARISON:  08/30/2016 FINDINGS: Normal heart size with aortic atherosclerosis. Mild vascular congestion similar to prior. No effusion or pneumothorax. No pulmonary consolidations. 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 No acute nor suspicious osseous abnormalities. No free air beneath the diaphragm. Moderate gaseous distention of the stomach. IMPRESSION: 1. Mild vascular congestion similar to prior without acute pneumonic consolidation, overt pulmonary edema nor pneumothorax. 2. Aortic atherosclerosis. 3. Moderate gaseous distention of the stomach. Electronically Signed   By: Tollie Eth M.D.   On: 09/11/2016 17:27   Mr Abdomen Mrcp Wo Contrast  Result Date: 09/13/2016 CLINICAL DATA:  Cholelithiasis and biliary dilatation  EXAM: MRI ABDOMEN WITHOUT CONTRAST  (INCLUDING MRCP) TECHNIQUE: Multiplanar multisequence MR imaging of the abdomen was performed. Heavily T2-weighted images of the biliary and pancreatic ducts were obtained, and three-dimensional MRCP images were rendered by post processing. COMPARISON:  Multiple exams, including 01/30/2016 and ultrasound of 09/09/2016 FINDINGS: Despite efforts by the technologist and patient, motion artifact is present on today's exam and could not be eliminated. This reduces exam sensitivity and specificity. Lower chest: Unremarkable Hepatobiliary: Mild biliary dilatation and the left hepatic duct near the confluence. The common hepatic duct measures 10 mm in diameter in the common bile duct 7 mm in diameter. New there is considerable narrowing and indistinct signal in the common bile duct as it new extends along the pancreatic head. New enlarging adjacent new septated lesion of  the pancreatic head may be contributory. Slightly beaded appearance of the CBD in this vicinity for example on image 39/6, although this irregularity is less striking for example on image 12/8 and is probably partially attributable to motion artifact. Blunted distal margin of the common bile duct for example on image 8/8, previously the distal CBD had a more conical morphology. Several gallstones are present measuring up to 1.6 cm in diameter. Pancreas: A septated cystic lesion along the pancreatic head measures 2.4 by 2.0 cm on image 19/5, and previously measured 1.7 by 1.8 cm by my measurements. Spleen:  Unremarkable Adrenals/Urinary Tract:  Small left pelvic kidney with non rotation. Stomach/Bowel: Gaseous prominence of the distal sigmoid colon. Vascular/Lymphatic:  Aortoiliac atherosclerotic vascular disease. Other:  No supplemental non-categorized findings. Musculoskeletal: Degenerative endplate findings at L5-S1. IMPRESSION: 1. Moderate enlargement of a septated cystic lesion in the pancreatic head. Current size 2.4  by 2.0 cm. This could be a benign cystic pancreatic lesion or intraductal papillary mucinous neoplasm. Based on current guidelines for patients under 79 years old, and the current size of the lesion, options going forward include a six-month follow up MRI, or EUS/fine-needle aspiration. This recommendation follows ACR consensus guidelines: Management of Incidental Pancreatic Cysts: A White Paper of the ACR Incidental Findings Committee. J Am Coll Radiol 2017;14:911-923. 2. Mild common hepatic duct and left hepatic duct dilatation. There is some indistinctness of the common bile duct in the vicinity of the pancreatic cyst which may be from mild extrinsic narrowing. Slight irregularity on some sequences in this vicinity is probably attributable to motion artifact rather than filling defect. However, the CBD terminates in a somewhat blunted end, rather than the prior conical and. This makes it difficult to exclude a small distal CBD stone in the ampulla. However, given the lack of dilatation of the CBD itself, I am skeptical of choledocholithiasis. 3. Cholelithiasis is present. 4.  Aortic Atherosclerosis (ICD10-I70.0). 5. Gaseous prominence of the sigmoid colon. 6. Small left pelvic kidney. Electronically Signed   By: Gaylyn Rong M.D.   On: 09/13/2016 08:56   Mr 3d Recon At Scanner  Result Date: 09/13/2016 CLINICAL DATA:  Cholelithiasis and biliary dilatation EXAM: MRI ABDOMEN WITHOUT CONTRAST  (INCLUDING MRCP) TECHNIQUE: Multiplanar multisequence MR imaging of the abdomen was performed. Heavily T2-weighted images of the biliary and pancreatic ducts were obtained, and three-dimensional MRCP images were rendered by post processing. COMPARISON:  Multiple exams, including 01/30/2016 and ultrasound of 09/09/2016 FINDINGS: Despite efforts by the technologist and patient, motion artifact is present on today's exam and could not be eliminated. This reduces exam sensitivity and specificity. Lower chest: Unremarkable  Hepatobiliary: Mild biliary dilatation and the left hepatic duct near the confluence. The common hepatic duct measures 10 mm in diameter in the common bile duct 7 mm in diameter. New there is considerable narrowing and indistinct signal in the common bile duct as it new extends along the pancreatic head. New enlarging adjacent new septated lesion of the pancreatic head may be contributory. Slightly beaded appearance of the CBD in this vicinity for example on image 39/6, although this irregularity is less striking for example on image 12/8 and is probably partially attributable to motion artifact. Blunted distal margin of the common bile duct for example on image 8/8, previously the distal CBD had a more conical morphology. Several gallstones are present measuring up to 1.6 cm in diameter. Pancreas: A septated cystic lesion along the pancreatic head measures 2.4 by 2.0 cm on image 19/5, and previously  measured 1.7 by 1.8 cm by my measurements. Spleen:  Unremarkable Adrenals/Urinary Tract:  Small left pelvic kidney with non rotation. Stomach/Bowel: Gaseous prominence of the distal sigmoid colon. Vascular/Lymphatic:  Aortoiliac atherosclerotic vascular disease. Other:  No supplemental non-categorized findings. Musculoskeletal: Degenerative endplate findings at L5-S1. IMPRESSION: 1. Moderate enlargement of a septated cystic lesion in the pancreatic head. Current size 2.4 by 2.0 cm. This could be a benign cystic pancreatic lesion or intraductal papillary mucinous neoplasm. Based on current guidelines for patients under 41 years old, and the current size of the lesion, options going forward include a six-month follow up MRI, or EUS/fine-needle aspiration. This recommendation follows ACR consensus guidelines: Management of Incidental Pancreatic Cysts: A White Paper of the ACR Incidental Findings Committee. J Am Coll Radiol 2017;14:911-923. 2. Mild common hepatic duct and left hepatic duct dilatation. There is some  indistinctness of the common bile duct in the vicinity of the pancreatic cyst which may be from mild extrinsic narrowing. Slight irregularity on some sequences in this vicinity is probably attributable to motion artifact rather than filling defect. However, the CBD terminates in a somewhat blunted end, rather than the prior conical and. This makes it difficult to exclude a small distal CBD stone in the ampulla. However, given the lack of dilatation of the CBD itself, I am skeptical of choledocholithiasis. 3. Cholelithiasis is present. 4.  Aortic Atherosclerosis (ICD10-I70.0). 5. Gaseous prominence of the sigmoid colon. 6. Small left pelvic kidney. Electronically Signed   By: Gaylyn Rong M.D.   On: 09/13/2016 08:56   US Abdomen Limited Ruq  Result Date: 09/11/2016 CLINICAL DATA:  Right upper quadrant pain. EXAM: ULTRASOUND ABDOMEN LIMITED RIGHT UPPER QUADRANT COMPARISON:  None. FINDINGS: Gallbladder: Multiple stones in the gallbladder. Sludge identified as well. The wall thickness is increased measuring 6.9 mm. No Murphy's sign. No pericholecystic fluid. Common bile duct: Diameter: 12.2 mm proximally. Shadowing bowel gas obscures the distal portion. Liver: Intrahepatic ductal dilatation. No focal mass. Portal vein is patent on color Doppler imaging with normal direction of blood flow towards the liver. IMPRESSION: 1. Cholelithiasis, sludge, and wall thickening with no pericholecystic fluid or Murphy's sign. 2. Intra and extrahepatic biliary duct dilatation. An underlying cause is not identified. Recommend an MRCP to better evaluate the common bile duct for choledocholithiasis or obstructing lesion. Electronically Signed   By: Gerome Sam III M.D   On: 09/11/2016 19:48     Scheduled Meds: . guaiFENesin  600 mg Oral BID  . insulin aspart  0-9 Units Subcutaneous Q4H  . pantoprazole  40 mg Oral Daily   Continuous Infusions:    Pamella Pert, MD, PhD [facility] Pager [phone]  [phone]  If 7PM-7AM, please contact night-coverage [email or web address] Password [email or web address] 09/13/2016, 11:23 AM

## 2016-09-14 ENCOUNTER — Inpatient Hospital Stay (HOSPITAL_COMMUNITY): Payer: Medicare (Managed Care)

## 2016-09-14 DIAGNOSIS — I5032 Chronic diastolic (congestive) heart failure: Secondary | ICD-10-CM

## 2016-09-14 LAB — ECHOCARDIOGRAM COMPLETE
E decel time: 373 msec
E/e' ratio: 10.77
FS: 37 % (ref 28–44)
HEIGHTINCHES: 67 in
IVS/LV PW RATIO, ED: 1.09
LA diam index: 2.57 cm/m2
LA vol A4C: 68.6 ml
LA vol index: 38.1 mL/m2
LASIZE: 43 mm
LAVOL: 63.7 mL
LDCA: 2.84 cm2
LEFT ATRIUM END SYS DIAM: 43 mm
LV E/e' medial: 10.77
LV E/e'average: 10.77
LV PW d: 10.5 mm — AB (ref 0.6–1.1)
LV TDI E'LATERAL: 6.85
LV TDI E'MEDIAL: 6.31
LVELAT: 6.85 cm/s
LVOT SV: 77 mL
LVOT VTI: 27 cm
LVOTD: 19 mm
LVOTPV: 92.7 cm/s
Lateral S' vel: 13.1 cm/s
MV Dec: 373
MV Peak grad: 2 mmHg
MV pk E vel: 73.8 m/s
MVPKAVEL: 116 m/s
TAPSE: 23.8 mm
WEIGHTICAEL: 2035.29 [oz_av]

## 2016-09-14 LAB — CBC
HCT: 32.1 % — ABNORMAL LOW (ref 36.0–46.0)
Hemoglobin: 10.6 g/dL — ABNORMAL LOW (ref 12.0–15.0)
MCH: 28.1 pg (ref 26.0–34.0)
MCHC: 33 g/dL (ref 30.0–36.0)
MCV: 85.1 fL (ref 78.0–100.0)
PLATELETS: 185 10*3/uL (ref 150–400)
RBC: 3.77 MIL/uL — AB (ref 3.87–5.11)
RDW: 14.7 % (ref 11.5–15.5)
WBC: 4.3 10*3/uL (ref 4.0–10.5)

## 2016-09-14 LAB — COMPREHENSIVE METABOLIC PANEL
ALK PHOS: 51 U/L (ref 38–126)
ALT: 12 U/L — AB (ref 14–54)
ANION GAP: 5 (ref 5–15)
AST: 20 U/L (ref 15–41)
Albumin: 3 g/dL — ABNORMAL LOW (ref 3.5–5.0)
BUN: 17 mg/dL (ref 6–20)
CALCIUM: 8.8 mg/dL — AB (ref 8.9–10.3)
CO2: 25 mmol/L (ref 22–32)
CREATININE: 1.55 mg/dL — AB (ref 0.44–1.00)
Chloride: 110 mmol/L (ref 101–111)
GFR, EST AFRICAN AMERICAN: 36 mL/min — AB (ref >60)
GFR, EST NON AFRICAN AMERICAN: 31 mL/min — AB (ref >60)
Glucose, Bld: 83 mg/dL (ref 65–99)
Potassium: 4.5 mmol/L (ref 3.5–5.1)
Sodium: 140 mmol/L (ref 135–145)
Total Bilirubin: 0.5 mg/dL (ref 0.3–1.2)
Total Protein: 5.7 g/dL — ABNORMAL LOW (ref 6.5–8.1)

## 2016-09-14 LAB — GLUCOSE, CAPILLARY
GLUCOSE-CAPILLARY: 112 mg/dL — AB (ref 65–99)
GLUCOSE-CAPILLARY: 72 mg/dL (ref 65–99)
GLUCOSE-CAPILLARY: 102 mg/dL — AB (ref 65–99)
GLUCOSE-CAPILLARY: 178 mg/dL — AB (ref 65–99)
Glucose-Capillary: 132 mg/dL — ABNORMAL HIGH (ref 65–99)

## 2016-09-14 MED ORDER — DEXTROSE 5 % IV SOLN
2.0000 g | INTRAVENOUS | Status: DC
  Administered 2016-09-15 – 2016-09-16 (×2): 2 g via INTRAVENOUS
  Filled 2016-09-14 (×2): qty 2

## 2016-09-14 NOTE — Progress Notes (Signed)
[facility] Progress Note     Subjective: CC: No complaints Patient resting comfortably in bed, finishing breakfast. Denies abdominal pain, nausea, vomiting. No questions about surgery tomorrow.   Objective: Vital signs in last 24 hours: Temp:  [97.7 F (36.5 C)-98.4 F (36.9 C)] 97.7 F (36.5 C) (08/22 2012) Pulse Rate:  [75-76] 75 (08/22 2012) Resp:  [18] 18 (08/22 2012) BP: (145-153)/(61-71) 153/61 (08/22 2012) SpO2:  [100 %] 100 % (08/22 2012) Last BM Date: 09/11/16  Intake/Output from previous day: 08/22 0701 - 08/23 0700 In: 240 [P.O.:240] Out: 1000 [Urine:1000] Intake/Output this shift: No intake/output data recorded.  PE: Gen:  Alert, NAD, pleasant Card:  Regular rate and rhythm, pedal pulses 2+ BL Pulm:  Normal effort, clear to auscultation bilaterally Abd: Soft, non-tender, non-distended, bowel sounds present in all 4 quadrants, no HSM Skin: warm and dry, no rashes  Psych: A&Ox3   Lab Results:   Recent Labs  09/11/16 1538 09/12/16 1355  WBC 4.7 5.0  HGB 10.5* 11.0*  HCT 31.8* 33.4*  PLT 200 180   BMET  Recent Labs  09/11/16 1538 09/12/16 0554  NA 138 137  K 4.2 3.8  CL 105 106  CO2 26 26  GLUCOSE 124* 87  BUN 23* 20  CREATININE 1.67* 1.53*  CALCIUM 8.9 8.8*   PT/INR No results for input(s): LABPROT, INR in the last 72 hours. CMP     Component Value Date/Time   NA 137 09/12/2016 0554   K 3.8 09/12/2016 0554   CL 106 09/12/2016 0554   CO2 26 09/12/2016 0554   GLUCOSE 87 09/12/2016 0554   BUN 20 09/12/2016 0554   CREATININE 1.53 (H) 09/12/2016 0554   CALCIUM 8.8 (L) 09/12/2016 0554   PROT 5.8 (L) 09/12/2016 0554   ALBUMIN 3.1 (L) 09/12/2016 0554   AST 22 09/12/2016 0554   ALT 13 (L) 09/12/2016 0554   ALKPHOS 54 09/12/2016 0554   BILITOT 0.7 09/12/2016 0554   GFRNONAA 31 (L) 09/12/2016 0554   GFRAA 36 (L) 09/12/2016 0554   Lipase     Component Value Date/Time   LIPASE 37 09/11/2016 1538        Studies/Results: Mr Abdomen Mrcp Wo Contrast  Result Date: 09/13/2016 CLINICAL DATA:  Cholelithiasis and biliary dilatation EXAM: MRI ABDOMEN WITHOUT CONTRAST  (INCLUDING MRCP) TECHNIQUE: Multiplanar multisequence MR imaging of the abdomen was performed. Heavily T2-weighted images of the biliary and pancreatic ducts were obtained, and three-dimensional MRCP images were rendered by post processing. COMPARISON:  Multiple exams, including 01/30/2016 and ultrasound of 09/09/2016 FINDINGS: Despite efforts by the technologist and patient, motion artifact is present on today's exam and could not be eliminated. This reduces exam sensitivity and specificity. Lower chest: Unremarkable Hepatobiliary: Mild biliary dilatation and the left hepatic duct near the confluence. The common hepatic duct measures 10 mm in diameter in the common bile duct 7 mm in diameter. New there is considerable narrowing and indistinct signal in the common bile duct as it new extends along the pancreatic head. New enlarging adjacent new septated lesion of the pancreatic head may be contributory. Slightly beaded appearance of the CBD in this vicinity for example on image 39/6, although this irregularity is less striking for example on image 12/8 and is probably partially attributable to motion artifact. Blunted distal margin of the common bile duct for example on image 8/8, previously the distal CBD had a more conical morphology. Several gallstones are present measuring up to 1.6 cm in diameter. Pancreas: A  septated cystic lesion along the pancreatic head measures 2.4 by 2.0 cm on image 19/5, and previously measured 1.7 by 1.8 cm by my measurements. Spleen:  Unremarkable Adrenals/Urinary Tract:  Small left pelvic kidney with non rotation. Stomach/Bowel: Gaseous prominence of the distal sigmoid colon. Vascular/Lymphatic:  Aortoiliac atherosclerotic vascular disease. Other:  No supplemental non-categorized findings. Musculoskeletal:  Degenerative endplate findings at L5-S1. IMPRESSION: 1. Moderate enlargement of a septated cystic lesion in the pancreatic head. Current size 2.4 by 2.0 cm. This could be a benign cystic pancreatic lesion or intraductal papillary mucinous neoplasm. Based on current guidelines for patients under 59 years old, and the current size of the lesion, options going forward include a six-month follow up MRI, or EUS/fine-needle aspiration. This recommendation follows ACR consensus guidelines: Management of Incidental Pancreatic Cysts: A White Paper of the ACR Incidental Findings Committee. J Am Coll Radiol 2017;14:911-923. 2. Mild common hepatic duct and left hepatic duct dilatation. There is some indistinctness of the common bile duct in the vicinity of the pancreatic cyst which may be from mild extrinsic narrowing. Slight irregularity on some sequences in this vicinity is probably attributable to motion artifact rather than filling defect. However, the CBD terminates in a somewhat blunted end, rather than the prior conical and. This makes it difficult to exclude a small distal CBD stone in the ampulla. However, given the lack of dilatation of the CBD itself, I am skeptical of choledocholithiasis. 3. Cholelithiasis is present. 4.  Aortic Atherosclerosis (ICD10-I70.0). 5. Gaseous prominence of the sigmoid colon. 6. Small left pelvic kidney. Electronically Signed   By: Gaylyn Rong M.D.   On: 09/13/2016 08:56   Mr 3d Recon At Scanner  Result Date: 09/13/2016 CLINICAL DATA:  Cholelithiasis and biliary dilatation EXAM: MRI ABDOMEN WITHOUT CONTRAST  (INCLUDING MRCP) TECHNIQUE: Multiplanar multisequence MR imaging of the abdomen was performed. Heavily T2-weighted images of the biliary and pancreatic ducts were obtained, and three-dimensional MRCP images were rendered by post processing. COMPARISON:  Multiple exams, including 01/30/2016 and ultrasound of 09/09/2016 FINDINGS: Despite efforts by the technologist and  patient, motion artifact is present on today's exam and could not be eliminated. This reduces exam sensitivity and specificity. Lower chest: Unremarkable Hepatobiliary: Mild biliary dilatation and the left hepatic duct near the confluence. The common hepatic duct measures 10 mm in diameter in the common bile duct 7 mm in diameter. New there is considerable narrowing and indistinct signal in the common bile duct as it new extends along the pancreatic head. New enlarging adjacent new septated lesion of the pancreatic head may be contributory. Slightly beaded appearance of the CBD in this vicinity for example on image 39/6, although this irregularity is less striking for example on image 12/8 and is probably partially attributable to motion artifact. Blunted distal margin of the common bile duct for example on image 8/8, previously the distal CBD had a more conical morphology. Several gallstones are present measuring up to 1.6 cm in diameter. Pancreas: A septated cystic lesion along the pancreatic head measures 2.4 by 2.0 cm on image 19/5, and previously measured 1.7 by 1.8 cm by my measurements. Spleen:  Unremarkable Adrenals/Urinary Tract:  Small left pelvic kidney with non rotation. Stomach/Bowel: Gaseous prominence of the distal sigmoid colon. Vascular/Lymphatic:  Aortoiliac atherosclerotic vascular disease. Other:  No supplemental non-categorized findings. Musculoskeletal: Degenerative endplate findings at L5-S1. IMPRESSION: 1. Moderate enlargement of a septated cystic lesion in the pancreatic head. Current size 2.4 by 2.0 cm. This could be a benign cystic pancreatic lesion  or intraductal papillary mucinous neoplasm. Based on current guidelines for patients under 20 years old, and the current size of the lesion, options going forward include a six-month follow up MRI, or EUS/fine-needle aspiration. This recommendation follows ACR consensus guidelines: Management of Incidental Pancreatic Cysts: A White Paper of the  ACR Incidental Findings Committee. J Am Coll Radiol 2017;14:911-923. 2. Mild common hepatic duct and left hepatic duct dilatation. There is some indistinctness of the common bile duct in the vicinity of the pancreatic cyst which may be from mild extrinsic narrowing. Slight irregularity on some sequences in this vicinity is probably attributable to motion artifact rather than filling defect. However, the CBD terminates in a somewhat blunted end, rather than the prior conical and. This makes it difficult to exclude a small distal CBD stone in the ampulla. However, given the lack of dilatation of the CBD itself, I am skeptical of choledocholithiasis. 3. Cholelithiasis is present. 4.  Aortic Atherosclerosis (ICD10-I70.0). 5. Gaseous prominence of the sigmoid colon. 6. Small left pelvic kidney. Electronically Signed   By: Gaylyn Rong M.D.   On: 09/13/2016 08:56    Anti-infectives: Anti-infectives    None       Assessment/Plan History of CVA with right-sided weakness and dysarthria HTN PV CKD DM Diastolic CHF  Cholelithiasis - Likely symptomatic cholelithiasis, afebrile and normal WBC so less likely cholecystits - MRCP showed moderate enlargement of a septated cystic lesion in the pancreatic head. Possibly a benign cystic lesion or a intraductal papillary mucinous neoplasm. This is not likely the source of her pain. GI following and recommend a repeat MRCP in 1 year. - consent, NPO after mn, will order periop abx  FEN: fulls, NPO after MN VTE: SCD's ID: periop abx to be given tomorrow Follow up: TBD  DISPO: Plan for OR tomorrow for laparoscopic cholecystectomy. GI following and appreciate their assistance.   LOS: 2 days    Wells Guiles , [facility] 09/14/2016, 9:50 AM Pager: [phone] Consults: [phone]  Agree with above. I have talked to her daughter, Angelique Blonder, about the surgery, risks, and post op care. Plan cholecystectomy on 8/24.  Ovidio Kin, MD, [facility] Pager: [phone] Office phone:  [phone]

## 2016-09-14 NOTE — Progress Notes (Signed)
PROGRESS NOTE  [name] TGG:[id number] DOB: 1935/07/06 DOA: 09/11/2016 PCP: Jethro Bastos, MD   LOS: 2 days   Brief Narrative / Interim history: [name] is an 81 y.o. female past medical history of essential hypertension, chronic kidney disease diabetes mellitus and chronic diastolic heart failure comes in for worsening right upper quadrant pain and near syncope that happened on the day of admission  Assessment & Plan: Active Problems:   HLD (hyperlipidemia)   Hypertension   Chronic kidney disease   Chronic diastolic CHF (congestive heart failure) (HCC)   RUQ abdominal pain   Postural dizziness with presyncope   RUQ pain    Right upper quadrant abdominal pain/cholelithiasis -GI and General surgery following. MRCP not clear for choledocholithiasis. General surgery will plan cholecystectomy 8/24 with IOC. If IOC positive will need to let GI know -continue to hold Plavix for now  Chronic diastolic CHF -Appears euvolemic, hold ACE inhibitor   Chronic kidney disease stage III -Baseline creatinine 1.5-1.8, currently at baseline, 1.55 this morning -continue to monitor  Hypertension -Blood pressure controlled this morning, hold losartan pre-op  Presyncope -No evidence of telemetry, cardiac markers have been negative 3 -2D echo is pending still today  -Carotid ultrasound 60 - 79% right ICA stenosis, upper end of scale, 40-59% left ICA stenosis.  We will likely need to be seen by vascular surgery as an outpatient  Prior stroke -With right-sided weakness, chronic, stable.  Currently her Plavix is on hold  Pancreatic lesion -MRCP showed a 2.4 x 2 cm septated cystic lesion which could be benign versus intraductal papillary mucinous neoplasm.  GI following, recommending repeat MRI as an outpatient in 1 year  Chronic lower extremity swelling right more than the left -Venous Doppler without evidence of DVT -Swelling is chronic   DVT prophylaxis: SCDs Code  Status: Full code Family Communication: No family at bedside, discussed with patient Disposition Plan: Home when ready  Consultants:   Gastroenterology  General surgery  Procedures:   None   Antimicrobials:  None    Subjective: -awaiting surgery, no chest pain, no nausea/vomiting today. Eating well.   Objective: Vitals:   09/13/16 0016 09/13/16 0422 09/13/16 1451 09/13/16 2012  BP: 120/60 (!) 141/69 (!) 145/71 (!) 153/61  Pulse: 63 69 76 75  Resp: 16 18 18 18   Temp: 98.1 F (36.7 C) 98.3 F (36.8 C) 98.4 F (36.9 C) 97.7 F (36.5 C)  TempSrc: Oral Oral Oral Oral  SpO2: 100% 100% 100% 100%  Weight:      Height:        Intake/Output Summary (Last 24 hours) at 09/14/16 1048 Last data filed at 09/14/16 1035  Gross per 24 hour  Intake              600 ml  Output             1650 ml  Net            -1050 ml   Filed Weights   09/12/16 0438  Weight: 57.7 kg (127 lb 3.3 oz)    Examination:  Vitals:   09/13/16 0016 09/13/16 0422 09/13/16 1451 09/13/16 2012  BP: 120/60 (!) 141/69 (!) 145/71 (!) 153/61  Pulse: 63 69 76 75  Resp: 16 18 18 18   Temp: 98.1 F (36.7 C) 98.3 F (36.8 C) 98.4 F (36.9 C) 97.7 F (36.5 C)  TempSrc: Oral Oral Oral Oral  SpO2: 100% 100% 100% 100%  Weight:  Height:        Constitutional: NAD Eyes:  No scleral icterus, normal conjunctivae Respiratory: CTA biL, no wheezing, no crackles. Normal respiratory effort Cardiovascular: RRR no MRG. 2+ LE edema Abdomen: non tender, non distended. BS + Neurologic: right sided weakness, slurres speech Psychiatric: AxOx3   Data Reviewed: I have independently reviewed following labs and imaging studies   CBC:  Recent Labs Lab 09/11/16 1538 09/12/16 1355 09/14/16 0922  WBC 4.7 5.0 4.3  NEUTROABS 3.6  --   --   HGB 10.5* 11.0* 10.6*  HCT 31.8* 33.4* 32.1*  MCV 86.2 83.5 85.1  PLT 200 180 185   Basic Metabolic Panel:  Recent Labs Lab 09/11/16 1538 09/12/16 0554  09/14/16 0922  NA 138 137 140  K 4.2 3.8 4.5  CL 105 106 110  CO2 26 26 25   GLUCOSE 124* 87 83  BUN 23* 20 17  CREATININE 1.67* 1.53* 1.55*  CALCIUM 8.9 8.8* 8.8*  MG  --  2.0  --   PHOS  --  3.4  --    GFR: Estimated Creatinine Clearance: 26.8 mL/min (A) (by C-G formula based on SCr of 1.55 mg/dL (H)). Liver Function Tests:  Recent Labs Lab 09/11/16 1538 09/12/16 0554 09/14/16 0922  AST 25 22 20   ALT 14 13* 12*  ALKPHOS 63 54 51  BILITOT 0.2* 0.7 0.5  PROT 6.0* 5.8* 5.7*  ALBUMIN 3.2* 3.1* 3.0*    Recent Labs Lab 09/11/16 1538  LIPASE 37   No results for input(s): AMMONIA in the last 168 hours. Coagulation Profile: No results for input(s): INR, PROTIME in the last 168 hours. Cardiac Enzymes:  Recent Labs Lab 09/11/16 2306 09/12/16 0554  TROPONINI <0.03 <0.03   BNP (last 3 results) No results for input(s): PROBNP in the last 8760 hours. HbA1C:  Recent Labs  09/12/16 0554  HGBA1C 5.8*   CBG:  Recent Labs Lab 09/13/16 1249 09/13/16 1700 09/13/16 2008 09/14/16 0306 09/14/16 0749  GLUCAP 94 121* 145* 112* 72   Lipid Profile: No results for input(s): CHOL, HDL, LDLCALC, TRIG, CHOLHDL, LDLDIRECT in the last 72 hours. Thyroid Function Tests:  Recent Labs  09/12/16 0554  TSH 1.166   Anemia Panel: No results for input(s): VITAMINB12, FOLATE, FERRITIN, TIBC, IRON, RETICCTPCT in the last 72 hours. Urine analysis:    Component Value Date/Time   COLORURINE YELLOW 09/11/2016 1608   APPEARANCEUR CLEAR 09/11/2016 1608   LABSPEC 1.008 09/11/2016 1608   PHURINE 5.0 09/11/2016 1608   GLUCOSEU NEGATIVE 09/11/2016 1608   HGBUR NEGATIVE 09/11/2016 1608   BILIRUBINUR NEGATIVE 09/11/2016 1608   KETONESUR NEGATIVE 09/11/2016 1608   PROTEINUR NEGATIVE 09/11/2016 1608   UROBILINOGEN 0.2 05/27/2012 1416   NITRITE NEGATIVE 09/11/2016 1608   LEUKOCYTESUR TRACE (A) 09/11/2016 1608   Sepsis Labs: Invalid input(s): PROCALCITONIN, LACTICIDVEN  Recent  Results (from the past 240 hour(s))  Urine culture     Status: Abnormal   Collection Time: 09/11/16  4:08 PM  Result Value Ref Range Status   Specimen Description URINE, CLEAN CATCH  Final   Special Requests NONE  Final   Culture MULTIPLE SPECIES PRESENT, SUGGEST RECOLLECTION (A)  Final   Report Status 09/13/2016 FINAL  Final      Radiology Studies: Mr Abdomen Mrcp Wo Contrast  Result Date: 09/13/2016 CLINICAL DATA:  Cholelithiasis and biliary dilatation EXAM: MRI ABDOMEN WITHOUT CONTRAST  (INCLUDING MRCP) TECHNIQUE: Multiplanar multisequence MR imaging of the abdomen was performed. Heavily T2-weighted images of the biliary and pancreatic  ducts were obtained, and three-dimensional MRCP images were rendered by post processing. COMPARISON:  Multiple exams, including 01/30/2016 and ultrasound of 09/09/2016 FINDINGS: Despite efforts by the technologist and patient, motion artifact is present on today's exam and could not be eliminated. This reduces exam sensitivity and specificity. Lower chest: Unremarkable Hepatobiliary: Mild biliary dilatation and the left hepatic duct near the confluence. The common hepatic duct measures 10 mm in diameter in the common bile duct 7 mm in diameter. New there is considerable narrowing and indistinct signal in the common bile duct as it new extends along the pancreatic head. New enlarging adjacent new septated lesion of the pancreatic head may be contributory. Slightly beaded appearance of the CBD in this vicinity for example on image 39/6, although this irregularity is less striking for example on image 12/8 and is probably partially attributable to motion artifact. Blunted distal margin of the common bile duct for example on image 8/8, previously the distal CBD had a more conical morphology. Several gallstones are present measuring up to 1.6 cm in diameter. Pancreas: A septated cystic lesion along the pancreatic head measures 2.4 by 2.0 cm on image 19/5, and previously  measured 1.7 by 1.8 cm by my measurements. Spleen:  Unremarkable Adrenals/Urinary Tract:  Small left pelvic kidney with non rotation. Stomach/Bowel: Gaseous prominence of the distal sigmoid colon. Vascular/Lymphatic:  Aortoiliac atherosclerotic vascular disease. Other:  No supplemental non-categorized findings. Musculoskeletal: Degenerative endplate findings at L5-S1. IMPRESSION: 1. Moderate enlargement of a septated cystic lesion in the pancreatic head. Current size 2.4 by 2.0 cm. This could be a benign cystic pancreatic lesion or intraductal papillary mucinous neoplasm. Based on current guidelines for patients under 34 years old, and the current size of the lesion, options going forward include a six-month follow up MRI, or EUS/fine-needle aspiration. This recommendation follows ACR consensus guidelines: Management of Incidental Pancreatic Cysts: A White Paper of the ACR Incidental Findings Committee. J Am Coll Radiol 2017;14:911-923. 2. Mild common hepatic duct and left hepatic duct dilatation. There is some indistinctness of the common bile duct in the vicinity of the pancreatic cyst which may be from mild extrinsic narrowing. Slight irregularity on some sequences in this vicinity is probably attributable to motion artifact rather than filling defect. However, the CBD terminates in a somewhat blunted end, rather than the prior conical and. This makes it difficult to exclude a small distal CBD stone in the ampulla. However, given the lack of dilatation of the CBD itself, I am skeptical of choledocholithiasis. 3. Cholelithiasis is present. 4.  Aortic Atherosclerosis (ICD10-I70.0). 5. Gaseous prominence of the sigmoid colon. 6. Small left pelvic kidney. Electronically Signed   By: Gaylyn Rong M.D.   On: 09/13/2016 08:56   Mr 3d Recon At Scanner  Result Date: 09/13/2016 CLINICAL DATA:  Cholelithiasis and biliary dilatation EXAM: MRI ABDOMEN WITHOUT CONTRAST  (INCLUDING MRCP) TECHNIQUE: Multiplanar  multisequence MR imaging of the abdomen was performed. Heavily T2-weighted images of the biliary and pancreatic ducts were obtained, and three-dimensional MRCP images were rendered by post processing. COMPARISON:  Multiple exams, including 01/30/2016 and ultrasound of 09/09/2016 FINDINGS: Despite efforts by the technologist and patient, motion artifact is present on today's exam and could not be eliminated. This reduces exam sensitivity and specificity. Lower chest: Unremarkable Hepatobiliary: Mild biliary dilatation and the left hepatic duct near the confluence. The common hepatic duct measures 10 mm in diameter in the common bile duct 7 mm in diameter. New there is considerable narrowing and indistinct signal in the  common bile duct as it new extends along the pancreatic head. New enlarging adjacent new septated lesion of the pancreatic head may be contributory. Slightly beaded appearance of the CBD in this vicinity for example on image 39/6, although this irregularity is less striking for example on image 12/8 and is probably partially attributable to motion artifact. Blunted distal margin of the common bile duct for example on image 8/8, previously the distal CBD had a more conical morphology. Several gallstones are present measuring up to 1.6 cm in diameter. Pancreas: A septated cystic lesion along the pancreatic head measures 2.4 by 2.0 cm on image 19/5, and previously measured 1.7 by 1.8 cm by my measurements. Spleen:  Unremarkable Adrenals/Urinary Tract:  Small left pelvic kidney with non rotation. Stomach/Bowel: Gaseous prominence of the distal sigmoid colon. Vascular/Lymphatic:  Aortoiliac atherosclerotic vascular disease. Other:  No supplemental non-categorized findings. Musculoskeletal: Degenerative endplate findings at L5-S1. IMPRESSION: 1. Moderate enlargement of a septated cystic lesion in the pancreatic head. Current size 2.4 by 2.0 cm. This could be a benign cystic pancreatic lesion or intraductal  papillary mucinous neoplasm. Based on current guidelines for patients under 60 years old, and the current size of the lesion, options going forward include a six-month follow up MRI, or EUS/fine-needle aspiration. This recommendation follows ACR consensus guidelines: Management of Incidental Pancreatic Cysts: A White Paper of the ACR Incidental Findings Committee. J Am Coll Radiol 2017;14:911-923. 2. Mild common hepatic duct and left hepatic duct dilatation. There is some indistinctness of the common bile duct in the vicinity of the pancreatic cyst which may be from mild extrinsic narrowing. Slight irregularity on some sequences in this vicinity is probably attributable to motion artifact rather than filling defect. However, the CBD terminates in a somewhat blunted end, rather than the prior conical and. This makes it difficult to exclude a small distal CBD stone in the ampulla. However, given the lack of dilatation of the CBD itself, I am skeptical of choledocholithiasis. 3. Cholelithiasis is present. 4.  Aortic Atherosclerosis (ICD10-I70.0). 5. Gaseous prominence of the sigmoid colon. 6. Small left pelvic kidney. Electronically Signed   By: Gaylyn Rong M.D.   On: 09/13/2016 08:56     Scheduled Meds: . guaiFENesin  600 mg Oral BID  . insulin aspart  0-9 Units Subcutaneous Q4H  . pantoprazole  40 mg Oral Daily   Continuous Infusions: . [START ON 09/15/2016] cefTRIAXone (ROCEPHIN)  IV        Pamella Pert, MD, PhD [facility] Pager [phone]  If 7PM-7AM, please contact night-coverage [email or web address] Password [facility] 09/14/2016, 10:48 AM

## 2016-09-14 NOTE — Progress Notes (Signed)
  Echocardiogram 2D Echocardiogram has been performed.  [name] T [name] 09/14/2016, 12:10 PM

## 2016-09-15 ENCOUNTER — Inpatient Hospital Stay (HOSPITAL_COMMUNITY): Payer: Medicare (Managed Care) | Admitting: Anesthesiology

## 2016-09-15 ENCOUNTER — Encounter (HOSPITAL_COMMUNITY)
Admission: EM | Disposition: A | Discharge status: Home Health | Payer: Self-pay | Source: Home / Self Care | Attending: Internal Medicine

## 2016-09-15 ENCOUNTER — Encounter (HOSPITAL_COMMUNITY): Payer: Self-pay | Admitting: Certified Registered Nurse Anesthetist

## 2016-09-15 ENCOUNTER — Inpatient Hospital Stay (HOSPITAL_COMMUNITY): Payer: Medicare (Managed Care)

## 2016-09-15 HISTORY — PX: CHOLECYSTECTOMY: SHX55

## 2016-09-15 LAB — GLUCOSE, CAPILLARY
GLUCOSE-CAPILLARY: 78 mg/dL (ref 65–99)
GLUCOSE-CAPILLARY: 161 mg/dL — AB (ref 65–99)
GLUCOSE-CAPILLARY: 147 mg/dL — AB (ref 65–99)
GLUCOSE-CAPILLARY: 213 mg/dL — AB (ref 65–99)
Glucose-Capillary: 115 mg/dL — ABNORMAL HIGH (ref 65–99)
Glucose-Capillary: 88 mg/dL (ref 65–99)

## 2016-09-15 LAB — CBC
HEMATOCRIT: 32.2 % — AB (ref 36.0–46.0)
Hemoglobin: 10.4 g/dL — ABNORMAL LOW (ref 12.0–15.0)
MCH: 27.4 pg (ref 26.0–34.0)
MCHC: 32.3 g/dL (ref 30.0–36.0)
MCV: 85 fL (ref 78.0–100.0)
Platelets: 189 10*3/uL (ref 150–400)
RBC: 3.79 MIL/uL — ABNORMAL LOW (ref 3.87–5.11)
RDW: 14.8 % (ref 11.5–15.5)
WBC: 4.7 10*3/uL (ref 4.0–10.5)

## 2016-09-15 LAB — BASIC METABOLIC PANEL
ANION GAP: 5 (ref 5–15)
BUN: 25 mg/dL — AB (ref 6–20)
CALCIUM: 8.6 mg/dL — AB (ref 8.9–10.3)
CO2: 24 mmol/L (ref 22–32)
Chloride: 107 mmol/L (ref 101–111)
Creatinine, Ser: 1.64 mg/dL — ABNORMAL HIGH (ref 0.44–1.00)
GFR calc Af Amer: 33 mL/min — ABNORMAL LOW (ref >60)
GFR calc non Af Amer: 29 mL/min — ABNORMAL LOW (ref >60)
GLUCOSE: 84 mg/dL (ref 65–99)
POTASSIUM: 5 mmol/L (ref 3.5–5.1)
Sodium: 136 mmol/L (ref 135–145)

## 2016-09-15 LAB — SURGICAL PCR SCREEN
MRSA, PCR: NEGATIVE
STAPHYLOCOCCUS AUREUS: NEGATIVE

## 2016-09-15 SURGERY — LAPAROSCOPIC CHOLECYSTECTOMY WITH INTRAOPERATIVE CHOLANGIOGRAM
Anesthesia: General

## 2016-09-15 MED ORDER — SUGAMMADEX SODIUM 200 MG/2ML IV SOLN
INTRAVENOUS | Status: DC | PRN
Start: 2016-09-15 — End: 2016-09-15
  Administered 2016-09-15: 150 mg via INTRAVENOUS

## 2016-09-15 MED ORDER — MEPERIDINE HCL 50 MG/ML IJ SOLN: 6.2500 mg | INTRAMUSCULAR | Status: DC | PRN

## 2016-09-15 MED ORDER — PROPOFOL 10 MG/ML IV BOLUS
INTRAVENOUS | Status: AC
  Filled 2016-09-15: qty 20

## 2016-09-15 MED ORDER — LACTATED RINGERS IV SOLN
INTRAVENOUS | Status: DC
  Administered 2016-09-15 (×2): via INTRAVENOUS

## 2016-09-15 MED ORDER — BUPIVACAINE-EPINEPHRINE 0.25% -1:200000 IJ SOLN
INTRAMUSCULAR | Status: DC | PRN
  Administered 2016-09-15: 30 mL

## 2016-09-15 MED ORDER — EPHEDRINE SULFATE-NACL 50-0.9 MG/10ML-% IV SOSY
PREFILLED_SYRINGE | INTRAVENOUS | Status: DC | PRN
  Administered 2016-09-15 (×3): 10 mg via INTRAVENOUS

## 2016-09-15 MED ORDER — DEXAMETHASONE SODIUM PHOSPHATE 10 MG/ML IJ SOLN
INTRAMUSCULAR | Status: DC | PRN
  Administered 2016-09-15: 10 mg via INTRAVENOUS

## 2016-09-15 MED ORDER — BUPIVACAINE-EPINEPHRINE (PF) 0.25% -1:200000 IJ SOLN
INTRAMUSCULAR | Status: AC
  Filled 2016-09-15: qty 30

## 2016-09-15 MED ORDER — ROCURONIUM BROMIDE 10 MG/ML (PF) SYRINGE
PREFILLED_SYRINGE | INTRAVENOUS | Status: DC | PRN
  Administered 2016-09-15: 40 mg via INTRAVENOUS

## 2016-09-15 MED ORDER — IOPAMIDOL (ISOVUE-300) INJECTION 61%
INTRAVENOUS | Status: AC
  Filled 2016-09-15: qty 50

## 2016-09-15 MED ORDER — FENTANYL CITRATE (PF) 250 MCG/5ML IJ SOLN
INTRAMUSCULAR | Status: AC
  Filled 2016-09-15: qty 5

## 2016-09-15 MED ORDER — SUGAMMADEX SODIUM 200 MG/2ML IV SOLN
INTRAVENOUS | Status: AC
  Filled 2016-09-15: qty 2

## 2016-09-15 MED ORDER — FENTANYL CITRATE (PF) 100 MCG/2ML IJ SOLN: 25.0000 ug | INTRAMUSCULAR | Status: DC | PRN

## 2016-09-15 MED ORDER — 0.9 % SODIUM CHLORIDE (POUR BTL) OPTIME
TOPICAL | Status: DC | PRN
  Administered 2016-09-15: 1000 mL

## 2016-09-15 MED ORDER — PHENYLEPHRINE 40 MCG/ML (10ML) SYRINGE FOR IV PUSH (FOR BLOOD PRESSURE SUPPORT)
PREFILLED_SYRINGE | INTRAVENOUS | Status: DC | PRN
  Administered 2016-09-15 (×2): 80 ug via INTRAVENOUS
  Administered 2016-09-15: 120 ug via INTRAVENOUS
  Administered 2016-09-15: 80 ug via INTRAVENOUS

## 2016-09-15 MED ORDER — FENTANYL CITRATE (PF) 100 MCG/2ML IJ SOLN
INTRAMUSCULAR | Status: DC | PRN
  Administered 2016-09-15: 50 ug via INTRAVENOUS
  Administered 2016-09-15: 25 ug via INTRAVENOUS
  Administered 2016-09-15: 50 ug via INTRAVENOUS

## 2016-09-15 MED ORDER — PROPOFOL 10 MG/ML IV BOLUS
INTRAVENOUS | Status: DC | PRN
  Administered 2016-09-15: 100 mg via INTRAVENOUS

## 2016-09-15 MED ORDER — IOPAMIDOL (ISOVUE-300) INJECTION 61%
INTRAVENOUS | Status: DC | PRN
  Administered 2016-09-15: 10 mL

## 2016-09-15 MED ORDER — TRAMADOL HCL 50 MG PO TABS: 50.0000 mg | ORAL_TABLET | Freq: Two times a day (BID) | ORAL | Status: DC | PRN

## 2016-09-15 MED ORDER — LIDOCAINE 2% (20 MG/ML) 5 ML SYRINGE
INTRAMUSCULAR | Status: DC | PRN
  Administered 2016-09-15: 60 mg via INTRAVENOUS

## 2016-09-15 MED ORDER — LACTATED RINGERS IR SOLN
Status: DC | PRN
  Administered 2016-09-15: 1000 mL

## 2016-09-15 SURGICAL SUPPLY — 45 items
ADH SKN CLS APL DERMABOND .7 (GAUZE/BANDAGES/DRESSINGS) ×1
APL SKNCLS STERI-STRIP NONHPOA (GAUZE/BANDAGES/DRESSINGS)
APPLIER CLIP 5 13 M/L LIGAMAX5 (MISCELLANEOUS) ×3
APPLIER CLIP ROT 10 11.4 M/L (STAPLE)
APR CLP MED LRG 11.4X10 (STAPLE)
APR CLP MED LRG 5 ANG JAW (MISCELLANEOUS) ×1
BAG SPEC RTRVL 10 TROC 200 (ENDOMECHANICALS) ×1
BAG SPEC RTRVL LRG 6X4 10 (ENDOMECHANICALS)
BENZOIN TINCTURE PRP APPL 2/3 (GAUZE/BANDAGES/DRESSINGS) IMPLANT
CABLE HIGH FREQUENCY MONO STRZ (ELECTRODE) ×3 IMPLANT
CHLORAPREP W/TINT 26ML (MISCELLANEOUS) ×3 IMPLANT
CHOLANGIOGRAM CATH TAUT (CATHETERS) ×3 IMPLANT
CLIP APPLIE 5 13 M/L LIGAMAX5 (MISCELLANEOUS) IMPLANT
CLIP APPLIE ROT 10 11.4 M/L (STAPLE) IMPLANT
CLOSURE WOUND 1/4X4 (GAUZE/BANDAGES/DRESSINGS)
COVER MAYO STAND STRL (DRAPES) ×3 IMPLANT
COVER SURGICAL LIGHT HANDLE (MISCELLANEOUS) ×3 IMPLANT
DECANTER SPIKE VIAL GLASS SM (MISCELLANEOUS) ×3 IMPLANT
DERMABOND ADVANCED (GAUZE/BANDAGES/DRESSINGS) ×2
DERMABOND ADVANCED .7 DNX12 (GAUZE/BANDAGES/DRESSINGS) IMPLANT
DRAPE C-ARM 42X120 X-RAY (DRAPES) ×3 IMPLANT
ELECT REM PT RETURN 15FT ADLT (MISCELLANEOUS) ×3 IMPLANT
GLOVE SURG SIGNA 7.5 PF LTX (GLOVE) ×3 IMPLANT
GOWN STRL REUS W/TWL XL LVL3 (GOWN DISPOSABLE) ×9 IMPLANT
HEMOSTAT SURGICEL 4X8 (HEMOSTASIS) IMPLANT
IV CATH 14GX2 1/4 (CATHETERS) ×3 IMPLANT
IV SET EXTENSION CATH 6 NF (IV SETS) ×3 IMPLANT
KIT BASIN OR (CUSTOM PROCEDURE TRAY) ×3 IMPLANT
POUCH RETRIEVAL ECOSAC 10 (ENDOMECHANICALS) ×1 IMPLANT
POUCH RETRIEVAL ECOSAC 10MM (ENDOMECHANICALS) ×2
POUCH SPECIMEN RETRIEVAL 10MM (ENDOMECHANICALS) ×1 IMPLANT
SCISSORS LAP 5X35 DISP (ENDOMECHANICALS) ×3 IMPLANT
SET IRRIG TUBING LAPAROSCOPIC (IRRIGATION / IRRIGATOR) ×2 IMPLANT
SLEEVE ADV FIXATION 5X100MM (TROCAR) ×3 IMPLANT
STOPCOCK 4 WAY LG BORE MALE ST (IV SETS) ×3 IMPLANT
STRIP CLOSURE SKIN 1/4X4 (GAUZE/BANDAGES/DRESSINGS) IMPLANT
SUT MNCRL AB 4-0 PS2 18 (SUTURE) ×3 IMPLANT
SYR 10ML ECCENTRIC (SYRINGE) ×3 IMPLANT
TOWEL OR 17X26 10 PK STRL BLUE (TOWEL DISPOSABLE) ×5 IMPLANT
TOWEL OR NON WOVEN STRL DISP B (DISPOSABLE) ×3 IMPLANT
TRAY LAPAROSCOPIC (CUSTOM PROCEDURE TRAY) ×3 IMPLANT
TROCAR ADV FIXATION 11X100MM (TROCAR) ×2 IMPLANT
TROCAR ADV FIXATION 5X100MM (TROCAR) ×3 IMPLANT
TROCAR XCEL BLUNT TIP 100MML (ENDOMECHANICALS) ×3 IMPLANT
TUBING INSUF HEATED (TUBING) ×3 IMPLANT

## 2016-09-15 NOTE — Progress Notes (Signed)
PROGRESS NOTE  [name] ZOX:[id number] DOB: 13-Mar-1935 DOA: 09/11/2016 PCP: Jethro Bastos, MD   LOS: 3 days   Brief Narrative / Interim history: [name] is an 81 y.o. female past medical history of essential hypertension, chronic kidney disease diabetes mellitus and chronic diastolic heart failure comes in for worsening right upper quadrant pain and near syncope that happened on the day of admission  Assessment & Plan: Active Problems:   HLD (hyperlipidemia)   Hypertension   Chronic kidney disease   Chronic diastolic CHF (congestive heart failure) (HCC)   RUQ abdominal pain   Postural dizziness with presyncope   RUQ pain    Right upper quadrant abdominal pain/cholelithiasis -GI and General surgery following. MRCP not clear for choledocholithiasis. General surgery will plan cholecystectomy today with IOC. If IOC positive will need to let GI know -continue to hold Plavix for now  Chronic diastolic CHF -Appears euvolemic, hold ACE inhibitor  -2D echo done 8/23 with EF of 60-65% with focal basal septal hypertrophy  Chronic kidney disease stage III -Baseline creatinine 1.5-1.8, currently at baseline, 1.55 this morning -continue to monitor  Hypertension -Blood pressure controlled this morning, hold losartan pre-op  Presyncope -No evidence of telemetry, cardiac markers have been negative 3 -2D echo is pending still today  -Carotid ultrasound 60 - 79% right ICA stenosis, upper end of scale, 40-59% left ICA stenosis.  We will likely need to be seen by vascular surgery as an outpatient  Prior stroke -With right-sided weakness, chronic, stable.  Currently her Plavix is on hold  Pancreatic lesion -MRCP showed a 2.4 x 2 cm septated cystic lesion which could be benign versus intraductal papillary mucinous neoplasm.  GI following, recommending repeat MRI as an outpatient in 1 year  Chronic lower extremity swelling right more than the left -Venous Doppler without  evidence of DVT -Swelling is chronic   DVT prophylaxis: SCDs Code Status: Full code Family Communication: No family at bedside, discussed with patient Disposition Plan: Home when ready  Consultants:   Gastroenterology  General surgery  Procedures:   2D echo Impressions: - Normal LV size with mild focal basal septal hypertrophy. EF 60-65%. Normal RV size and systolic function. No significant valvular abnormalities.   Cholecystectomy 8/24   Antimicrobials:  None    Subjective: -No complaints this morning, no abdominal pain, nausea or vomiting.  Objective: Vitals:   09/13/16 1451 09/13/16 2012 09/14/16 2009 09/15/16 0432  BP: (!) 145/71 (!) 153/61 (!) 116/49 (!) 130/59  Pulse: 76 75 85 71  Resp: 18 18 18 18   Temp: 98.4 F (36.9 C) 97.7 F (36.5 C) 98.3 F (36.8 C) (!) 97.4 F (36.3 C)  TempSrc: Oral Oral Oral Oral  SpO2: 100% 100% 100% 100%  Weight:      Height:        Intake/Output Summary (Last 24 hours) at 09/15/16 1023 Last data filed at 09/15/16 0457  Gross per 24 hour  Intake              770 ml  Output             1150 ml  Net             -380 ml   Filed Weights   09/12/16 0438  Weight: 57.7 kg (127 lb 3.3 oz)    Examination:  Vitals:   09/13/16 1451 09/13/16 2012 09/14/16 2009 09/15/16 0432  BP: (!) 145/71 (!) 153/61 (!) 116/49 (!) 130/59  Pulse: 76 75  85 71  Resp: 18 18 18 18   Temp: 98.4 F (36.9 C) 97.7 F (36.5 C) 98.3 F (36.8 C) (!) 97.4 F (36.3 C)  TempSrc: Oral Oral Oral Oral  SpO2: 100% 100% 100% 100%  Weight:      Height:        Constitutional: NAD Respiratory: CTA, no wheezing Cardiovascular: RRR, LE edema Abdomen: non distended  Data Reviewed: I have independently reviewed following labs and imaging studies   CBC:  Recent Labs Lab 09/11/16 1538 09/12/16 1355 09/14/16 0922 09/15/16 0703  WBC 4.7 5.0 4.3 4.7  NEUTROABS 3.6  --   --   --   HGB 10.5* 11.0* 10.6* 10.4*  HCT 31.8* 33.4* 32.1* 32.2*  MCV 86.2  83.5 85.1 85.0  PLT 200 180 185 189   Basic Metabolic Panel:  Recent Labs Lab 09/11/16 1538 09/12/16 0554 09/14/16 0922 09/15/16 0703  NA 138 137 140 136  K 4.2 3.8 4.5 5.0  CL 105 106 110 107  CO2 26 26 25 24   GLUCOSE 124* 87 83 84  BUN 23* 20 17 25*  CREATININE 1.67* 1.53* 1.55* 1.64*  CALCIUM 8.9 8.8* 8.8* 8.6*  MG  --  2.0  --   --   PHOS  --  3.4  --   --    GFR: Estimated Creatinine Clearance: 25.3 mL/min (A) (by C-G formula based on SCr of 1.64 mg/dL (H)). Liver Function Tests:  Recent Labs Lab 09/11/16 1538 09/12/16 0554 09/14/16 0922  AST 25 22 20   ALT 14 13* 12*  ALKPHOS 63 54 51  BILITOT 0.2* 0.7 0.5  PROT 6.0* 5.8* 5.7*  ALBUMIN 3.2* 3.1* 3.0*    Recent Labs Lab 09/11/16 1538  LIPASE 37   No results for input(s): AMMONIA in the last 168 hours. Coagulation Profile: No results for input(s): INR, PROTIME in the last 168 hours. Cardiac Enzymes:  Recent Labs Lab 09/11/16 2306 09/12/16 0554  TROPONINI <0.03 <0.03   BNP (last 3 results) No results for input(s): PROBNP in the last 8760 hours. HbA1C: No results for input(s): HGBA1C in the last 72 hours. CBG:  Recent Labs Lab 09/14/16 1653 09/14/16 2012 09/15/16 0037 09/15/16 0432 09/15/16 0742  GLUCAP 178* 132* 115* 88 78   Lipid Profile: No results for input(s): CHOL, HDL, LDLCALC, TRIG, CHOLHDL, LDLDIRECT in the last 72 hours. Thyroid Function Tests: No results for input(s): TSH, T4TOTAL, FREET4, T3FREE, THYROIDAB in the last 72 hours. Anemia Panel: No results for input(s): VITAMINB12, FOLATE, FERRITIN, TIBC, IRON, RETICCTPCT in the last 72 hours. Urine analysis:    Component Value Date/Time   COLORURINE YELLOW 09/11/2016 1608   APPEARANCEUR CLEAR 09/11/2016 1608   LABSPEC 1.008 09/11/2016 1608   PHURINE 5.0 09/11/2016 1608   GLUCOSEU NEGATIVE 09/11/2016 1608   HGBUR NEGATIVE 09/11/2016 1608   BILIRUBINUR NEGATIVE 09/11/2016 1608   KETONESUR NEGATIVE 09/11/2016 1608    PROTEINUR NEGATIVE 09/11/2016 1608   UROBILINOGEN 0.2 05/27/2012 1416   NITRITE NEGATIVE 09/11/2016 1608   LEUKOCYTESUR TRACE (A) 09/11/2016 1608   Sepsis Labs: Invalid input(s): PROCALCITONIN, LACTICIDVEN  Recent Results (from the past 240 hour(s))  Urine culture     Status: Abnormal   Collection Time: 09/11/16  4:08 PM  Result Value Ref Range Status   Specimen Description URINE, CLEAN CATCH  Final   Special Requests NONE  Final   Culture MULTIPLE SPECIES PRESENT, SUGGEST RECOLLECTION (A)  Final   Report Status 09/13/2016 FINAL  Final  Radiology Studies: No results found.   Scheduled Meds: . [MAR Hold] guaiFENesin  600 mg Oral BID  . [MAR Hold] insulin aspart  0-9 Units Subcutaneous Q4H  . [MAR Hold] pantoprazole  40 mg Oral Daily   Continuous Infusions: . [MAR Hold] cefTRIAXone (ROCEPHIN)  IV Stopped (09/15/16 0527)  . lactated ringers 50 mL/hr at 09/15/16 0948      Pamella Pert, MD, PhD [facility] Pager [phone]  If 7PM-7AM, please contact night-coverage [email or web address] Password [facility] 09/15/2016, 10:23 AM

## 2016-09-15 NOTE — Anesthesia Procedure Notes (Signed)
Procedure Name: Intubation Performed by: Dessirae Scarola J Pre-anesthesia Checklist: Patient identified, Emergency Drugs available, Suction available, Patient being monitored and Timeout performed Patient Re-evaluated:Patient Re-evaluated prior to induction Oxygen Delivery Method: Circle system utilized Preoxygenation: Pre-oxygenation with 100% oxygen Induction Type: IV induction Ventilation: Mask ventilation without difficulty Laryngoscope Size: Mac and 3 Grade View: Grade I Tube type: Oral Tube size: 7.0 mm Number of attempts: 1 Airway Equipment and Method: Stylet Placement Confirmation: ETT inserted through vocal cords under direct vision,  positive ETCO2,  CO2 detector and breath sounds checked- equal and bilateral Secured at: 21 cm Tube secured with: Tape Dental Injury: Teeth and Oropharynx as per pre-operative assessment        

## 2016-09-15 NOTE — Anesthesia Postprocedure Evaluation (Signed)
Anesthesia Post Note  Patient: CLINTONIA CARTAYA  Procedure(s) Performed: Procedure(s) (LRB): LAPAROSCOPIC CHOLECYSTECTOMY WITH INTRAOPERATIVE CHOLANGIOGRAM (N/A)     Patient location during evaluation: PACU Anesthesia Type: General Level of consciousness: awake and alert Pain management: pain level controlled Vital Signs Assessment: post-procedure vital signs reviewed and stable Respiratory status: spontaneous breathing, nonlabored ventilation, respiratory function stable and patient connected to nasal cannula oxygen Cardiovascular status: blood pressure returned to baseline and stable Postop Assessment: no signs of nausea or vomiting Anesthetic complications: no    Last Vitals:  Vitals:   09/14/16 2009 09/15/16 0432  BP: (!) 116/49 (!) 130/59  Pulse: 85 71  Resp: 18 18  Temp: 36.8 C (!) 36.3 C  SpO2: 100% 100%    Last Pain:  Vitals:   09/15/16 0800  TempSrc:   PainSc: 0-No pain                 Avigdor Dollar

## 2016-09-15 NOTE — Anesthesia Preprocedure Evaluation (Signed)
Anesthesia Evaluation  Patient identified by MRN, date of birth, ID band Patient awake    Reviewed: Allergy & Precautions, NPO status , Patient's Chart, lab work & pertinent test results  Airway Mallampati: II  TM Distance: >3 FB Neck ROM: Full    Dental  (+) Dental Advisory Given, Edentulous Upper, Edentulous Lower   Pulmonary asthma , former smoker,    Pulmonary exam normal breath sounds clear to auscultation       Cardiovascular hypertension, Pt. on medications Normal cardiovascular exam Rhythm:Regular Rate:Normal  HLD   Neuro/Psych PSYCHIATRIC DISORDERS Depression Dementia CVA (right sided deficits, slurred speech), Residual Symptoms    GI/Hepatic Neg liver ROS, PUD,   Endo/Other  diabetes, Type 2  Renal/GU Renal InsufficiencyRenal disease     Musculoskeletal  (+) Arthritis ,   Abdominal   Peds  Hematology  (+) Blood dyscrasia (Plavix therapy), anemia ,   Anesthesia Other Findings ECHO 8/18  - Left ventricle: The cavity size was normal. There was mild focal   basal hypertrophy of the septum. Systolic function was normal.   The estimated ejection fraction was in the range of 60% to 65%.   Wall motion was normal; there were no regional wall motion   abnormalities. Doppler parameters are consistent with abnormal   left ventricular relaxation (grade 1 diastolic dysfunction). - Mitral valve: There was trivial regurgitation. - Left atrium: The atrium was mildly to moderately dilated. - Right ventricle: The cavity size was normal. Systolic function   was normal.  Reproductive/Obstetrics                             Anesthesia Physical  Anesthesia Plan  ASA: III  Anesthesia Plan: General   Post-op Pain Management:    Induction: Intravenous  PONV Risk Score and Plan: 2 and Ondansetron, Dexamethasone and Treatment may vary due to age or medical condition  Airway Management Planned:  Oral ETT  Additional Equipment:   Intra-op Plan:   Post-operative Plan: Extubation in OR  Informed Consent: I have reviewed the patients History and Physical, chart, labs and discussed the procedure including the risks, benefits and alternatives for the proposed anesthesia with the patient or authorized representative who has indicated his/her understanding and acceptance.   Dental advisory given  Plan Discussed with: CRNA and Anesthesiologist  Anesthesia Plan Comments: (Discussed risks/benefits/alternatives to MAC sedation including need for ventilatory support, hypotension, need for conversion to general anesthesia.  All patient questions answered.  Patient/guardian wishes to proceed.)        Anesthesia Quick Evaluation

## 2016-09-15 NOTE — Op Note (Signed)
09/11/2016 - 09/15/2016  11:34 AM  PATIENT:  [name], 81 y.o., female, MRN: [id number]  PREOP DIAGNOSIS:  cholelithiasis  POSTOP DIAGNOSIS:   Chronic cholecystitis, cholelithiasis  PROCEDURE:   Procedure(s): LAPAROSCOPIC CHOLECYSTECTOMY WITH INTRAOPERATIVE CHOLANGIOGRAM (5 port)  SURGEON:   Ovidio Kin, M.D.  ASSISTANT:   H. Ingram/C. Cliffton Asters, M.D.  ANESTHESIA:   general  Anesthesiologist: Bethena Midget, MD CRNA: Kizzie Fantasia, CRNA; Thornell Mule, CRNA  General  ASA: 3  EBL:  minimal  ml  BLOOD ADMINISTERED: none  DRAINS: none   LOCAL MEDICATIONS USED:   30 cc 1/4% marcaine with epi  SPECIMEN:   Gall bladder  COUNTS CORRECT:  YES  INDICATIONS FOR PROCEDURE:  [name] is a 81 y.o. (DOB: 1935-12-26) AA female whose primary care physician is Jethro Bastos, MD and comes for cholecystectomy.   The indications and risks of the gall bladder surgery were explained to the patient.  The risks include, but are not limited to, infection, bleeding, common bile duct injury and open surgery.  SURGERY:  The patient was taken to OR room #[facility] at [facility].  The abdomen was prepped with chloroprep.  The patient was given 2 gm Ancef at the beginning of the operation.   A time out was held and the surgical checklist run.   An infraumbilical incision was made into the abdominal cavity.  A 12 mm Hasson trocar was inserted into the abdominal cavity through the infraumbilical incision and secured with a 0 Vicryl suture.  Three additional trocars were inserted: a 10 mm trocar in the sub-xiphoid location, a 5 mm trocar in the right mid subcostal area, and a 5 mm trocar in the right lateral subcostal area.   The abdomen was explored and the liver, stomach, and bowel that could be seen were unremarkable.   The gall bladder was encased in thin fat/omentum.  It was stuck to the duodenum.  The left lobe of the liver was floppy and flopped over the gall bladder  and common bile duct.  But the gall bladder was not severely inflamed.   I had to place a 5th port, primarily to hold the left lobe of the liver out of the way.  I grasped the gall bladder and rotated it cephalad.  Disssection was carried down to the gall bladder/cystic duct junction and the cystic duct isolated.  A clip was placed on the gall bladder side of the cystic duct.   An intra-operative cholangiogram was shot.   The intra-operative cholangiogram was shot using a cut off Taut catheter placed through a 14 gauge angiocath in the RUQ.  The Taut catheter was inserted in the cut cystic duct and secured with an endoclip.  A cholangiogram was shot with 10 cc of 1/2 strength Isoview.  Using fluoroscopy, the cholangiogram showed the flow of contrast into the common bile duct, up the hepatic radicals, and into the duodenum.  The common bile was enlarged.  There was no mass or obstruction.  This was a normal intra-operative cholangiogram.   The Taut catheter was removed.  The cystic duct was tripley endoclipped and the cystic artery was identified and clipped.  The gall bladder was bluntly and sharpley dissected from the gall bladder bed.   After the gall bladder was removed from the liver, the gall bladder bed and Triangle of [place] were inspected.  There was no bleeding or bile leak.  The gall bladder was placed in a endocatch bag and  delivered through the umbilicus.  The abdomen was irrigated with 800 cc saline.   The trocars were then removed.  I infiltrated 30cc of 1/4% Marcaine into the incisions.  The umbilical port closed with a 0 Vicryl suture and the skin closed with 4-0 Monocryl.  The skin was painted with DermaBond.  The patient's sponge and needle count were correct.  The patient was transported to the RR in good condition.  Ovidio Kin, MD, [facility] Surgery Pager: [phone] Office phone:  [phone]

## 2016-09-15 NOTE — Transfer of Care (Signed)
Immediate Anesthesia Transfer of Care Note  Patient: [name]  Procedure(s) Performed: Procedure(s): LAPAROSCOPIC CHOLECYSTECTOMY WITH INTRAOPERATIVE CHOLANGIOGRAM (N/A)  Patient Location: PACU  Anesthesia Type:General  Level of Consciousness: sedated, patient cooperative and responds to stimulation  Airway & Oxygen Therapy: Patient Spontanous Breathing and Patient connected to face mask oxygen  Post-op Assessment: Report given to RN and Post -op Vital signs reviewed and stable  Post vital signs: Reviewed and stable  Last Vitals:  Vitals:   09/14/16 2009 09/15/16 0432  BP: (!) 116/49 (!) 130/59  Pulse: 85 71  Resp: 18 18  Temp: 36.8 C (!) 36.3 C  SpO2: 100% 100%    Last Pain:  Vitals:   09/15/16 0800  TempSrc:   PainSc: 0-No pain         Complications: No apparent anesthesia complications

## 2016-09-15 NOTE — Progress Notes (Signed)
PT Cancellation Note  Patient Details Name: [name] MRN: [id number] DOB: 10-19-1935   Cancelled Treatment:    Reason Eval/Treat Not Completed: Medical issues which prohibited therapy (pt had surgery today, will attmpt PT eval tomorrow. )   Tamala Ser 09/15/2016, 3:57 PM [phone]

## 2016-09-16 DIAGNOSIS — K810 Acute cholecystitis: Secondary | ICD-10-CM

## 2016-09-16 LAB — GLUCOSE, CAPILLARY
GLUCOSE-CAPILLARY: 115 mg/dL — AB (ref 65–99)
GLUCOSE-CAPILLARY: 117 mg/dL — AB (ref 65–99)
GLUCOSE-CAPILLARY: 118 mg/dL — AB (ref 65–99)
Glucose-Capillary: 98 mg/dL (ref 65–99)
Glucose-Capillary: 116 mg/dL — ABNORMAL HIGH (ref 65–99)

## 2016-09-16 LAB — CBC
HCT: 27.7 % — ABNORMAL LOW (ref 36.0–46.0)
HEMOGLOBIN: 9.2 g/dL — AB (ref 12.0–15.0)
MCH: 28.2 pg (ref 26.0–34.0)
MCHC: 33.2 g/dL (ref 30.0–36.0)
MCV: 85 fL (ref 78.0–100.0)
Platelets: 164 10*3/uL (ref 150–400)
RBC: 3.26 MIL/uL — ABNORMAL LOW (ref 3.87–5.11)
RDW: 14.9 % (ref 11.5–15.5)
WBC: 7.5 10*3/uL (ref 4.0–10.5)

## 2016-09-16 LAB — COMPREHENSIVE METABOLIC PANEL
ALBUMIN: 2.6 g/dL — AB (ref 3.5–5.0)
ALK PHOS: 44 U/L (ref 38–126)
ALT: 29 U/L (ref 14–54)
ANION GAP: 7 (ref 5–15)
AST: 45 U/L — ABNORMAL HIGH (ref 15–41)
BILIRUBIN TOTAL: 0.3 mg/dL (ref 0.3–1.2)
BUN: 26 mg/dL — ABNORMAL HIGH (ref 6–20)
CALCIUM: 8.3 mg/dL — AB (ref 8.9–10.3)
CO2: 26 mmol/L (ref 22–32)
Chloride: 104 mmol/L (ref 101–111)
Creatinine, Ser: 1.63 mg/dL — ABNORMAL HIGH (ref 0.44–1.00)
GFR calc Af Amer: 33 mL/min — ABNORMAL LOW (ref >60)
GFR calc non Af Amer: 29 mL/min — ABNORMAL LOW (ref >60)
GLUCOSE: 96 mg/dL (ref 65–99)
Potassium: 4.7 mmol/L (ref 3.5–5.1)
Sodium: 137 mmol/L (ref 135–145)
TOTAL PROTEIN: 5.4 g/dL — AB (ref 6.5–8.1)

## 2016-09-16 MED ORDER — TRAMADOL HCL 50 MG PO TABS: 50.0000 mg | ORAL_TABLET | Freq: Two times a day (BID) | ORAL | 0 refills | Status: DC | PRN

## 2016-09-16 MED ORDER — CLOPIDOGREL BISULFATE 75 MG PO TABS: 75.0000 mg | ORAL_TABLET | Freq: Every day | ORAL | 0 refills | Status: DC

## 2016-09-16 NOTE — Evaluation (Signed)
Physical Therapy Evaluation Patient Details Name: [name] MRN: [id number] DOB: March 31, 1935 Today's Date: 09/16/2016   History of Present Illness  [name] is a 81 y.o. female with medical history significant of HTN, history of stroke, PVD, CKD, DM2, diastolic CHF admitted with R UQ pain and s/p LAPAROSCOPIC CHOLECYSTECTOMY WITH INTRAOPERATIVE CHOLANGIOGRAM  (09/15/16)  Clinical Impression  Pt admitted with above diagnosis. Pt currently with functional limitations due to the deficits listed below (see PT Problem List).  Pt will benefit from skilled PT to increase their independence and safety with mobility to allow discharge to the venue listed below.  Pt goes to [name], Thurs, and Fri.  On Mon and Tues, she is home alone while daughter drives school bus in the AM and then in afternoon.  Pt normally gets self up while daughter is working AM shift. Daughter is present between shifts.  Pt does need physical A at this time for sit <> stand and for safety with gait.  Recommend HH Aide to A pt while daughter is at work, especially in the AM.  If unable to get an Aide, then recommend pt remaining in bed until daughter returns home between shifts at this time due to weakness.  Pt educated to have someone with her for gait and transfers at this time and she verbalized understanding. Recommend HHPT and HH Aide.     Follow Up Recommendations Home health PT;Supervision for mobility/OOB;Supervision/Assistance - 24 hour (HH aide)    Equipment Recommendations  None recommended by PT    Recommendations for Other Services       Precautions / Restrictions Precautions Precautions: Fall Restrictions Weight Bearing Restrictions: No      Mobility  Bed Mobility               General bed mobility comments: Pt up in recliner upon arrival  Transfers Overall transfer level: Needs assistance Equipment used: Rolling walker (2 wheeled) Transfers: Sit to/from Stand Sit to Stand: Min  assist;Mod assist         General transfer comment: MIN/MOD A from recliner and MOD A from toilet.  Cues for forward weight shift  Ambulation/Gait Ambulation/Gait assistance: Min assist Ambulation Distance (Feet): 20 Feet (x 2) Assistive device: Rolling walker (2 wheeled) Gait Pattern/deviations: Ataxic;Decreased step length - right;Decreased step length - left;Narrow base of support;Trunk flexed;Shuffle Gait velocity: decreased   General Gait Details: Pt ambulates with flexed posture and ataxic gait due to hx of CVA with shuffled gait pattern.  She needs cues to stay within RW and responded well to cues for step length.  Pt reports she has special shoes she walks in, but didn't have them in hospital.  Stairs            Wheelchair Mobility    Modified Rankin (Stroke Patients Only)       Balance Overall balance assessment: Needs assistance           Standing balance-Leahy Scale: Poor Standing balance comment: requires RW for UE support                             Pertinent Vitals/Pain Pain Assessment: No/denies pain    Home Living Family/patient expects to be discharged to:: Private residence Living Arrangements: Children Available Help at Discharge: Family;Available PRN/intermittently;Other (Comment) ([facility]) Type of Home: House Home Access: Stairs to enter   Entrance Stairs-Number of Steps: 1 Home Layout: One level Home Equipment: Dan Humphreys -  2 wheels;Shower seat;Grab bars - tub/shower Additional Comments: goes to [name], [employer], Fri    Prior Function Level of Independence: Independent with assistive device(s)         Comments: Amb withRW     Hand Dominance        Extremity/Trunk Assessment   Upper Extremity Assessment Upper Extremity Assessment: RUE deficits/detail RUE Deficits / Details: hx of CVA, decreased ROM, strength, grip RUE Coordination: decreased gross motor;decreased fine motor    Lower Extremity Assessment Lower  Extremity Assessment: RLE deficits/detail RLE Deficits / Details: hx of CVA RLE Coordination: decreased gross motor       Communication   Communication: Expressive difficulties (dysarthric)  Cognition Arousal/Alertness: Awake/alert   Overall Cognitive Status: Difficult to assess                                 General Comments: appears oriented      General Comments      Exercises     Assessment/Plan    PT Assessment Patient needs continued PT services  PT Problem List Decreased strength;Decreased activity tolerance;Decreased balance;Decreased mobility       PT Treatment Interventions DME instruction;Gait training;Functional mobility training;Therapeutic activities;Therapeutic exercise;Balance training;Neuromuscular re-education;Patient/family education    PT Goals (Current goals can be found in the Care Plan section)  Acute Rehab PT Goals Patient Stated Goal: go home PT Goal Formulation: With patient Time For Goal Achievement: 09/30/16 Potential to Achieve Goals: Good    Frequency Min 3X/week   Barriers to discharge        Co-evaluation               AM-PAC PT "6 Clicks" Daily Activity  Outcome Measure Difficulty turning over in bed (including adjusting bedclothes, sheets and blankets)?: Unable Difficulty moving from lying on back to sitting on the side of the bed? : Unable Difficulty sitting down on and standing up from a chair with arms (e.g., wheelchair, bedside commode, etc,.)?: Unable Help needed moving to and from a bed to chair (including a wheelchair)?: A Little Help needed walking in hospital room?: A Little Help needed climbing 3-5 steps with a railing? : A Little 6 Click Score: 12    End of Session Equipment Utilized During Treatment: Gait belt Activity Tolerance: Patient tolerated treatment well Patient left: in chair;with call bell/phone within reach Nurse Communication: Mobility status PT Visit Diagnosis: Ataxic gait  (R26.0);Difficulty in walking, not elsewhere classified (R26.2)    Time: 6438-3779 PT Time Calculation (min) (ACUTE ONLY): 33 min   Charges:   PT Evaluation $PT Eval Moderate Complexity: 1 Mod PT Treatments $Gait Training: 8-22 mins   PT G Codes:        [name], [place] Pager [phone] 09/16/2016   [name] 09/16/2016, 12:30 PM

## 2016-09-16 NOTE — Care Management Note (Signed)
Case Management Note  Patient Details  Name: [name] MRN: [id number] Date of Birth: [birth year]  Subjective/Objective:    80 yr old female s/p Lap Choli.                Action/Plan: Case manager spoke with patient's daughter- [name] -[phone], concerning discharge plan. Patient is active with [facility] and they will arrange for aides as needed. CM has left a message at [facility] [phone] and will followup on Monday 09/18/16. Patient and daughter reside together in the home so patient will not be unattended. Daughter expressed appreciation for call.   Expected Discharge Date:  09/16/16               Expected Discharge Plan:  Home w Home Health Services  In-House Referral:     Discharge planning Services  CM Consult  Post Acute Care Choice:  Home Health Choice offered to:  Adult Children (CM contacted patient's daughter [name] )  DME Arranged:  N/A DME Agency:     HH Arranged:  PT, Nurse's Aide, RN HH Agency:   (Patient is active with [facility] )  Status of Service:  In process, will continue to follow  If discussed at Long Length of Stay Meetings, dates discussed:    Additional Comments:  Durenda Guthrie, RN 09/16/2016, 12:17 PM

## 2016-09-16 NOTE — Discharge Summary (Signed)
Physician Discharge Summary  [name] ZOX:[id number] DOB: 04-04-1935 DOA: 09/11/2016  PCP: Jethro Bastos, MD  Admit date: 09/11/2016 Discharge date: 09/16/2016  Admitted From: home Disposition:  home  Recommendations for Outpatient Follow-up:  1. Follow up with PCP in 1-2 weeks  Home Health: PT, RN, aide Equipment/Devices: walker  Discharge Condition: stable CODE STATUS: Full code Diet recommendation: heart healthy  HPI: Per Dr. Adela Glimpse, [name] is a 81 y.o. female with medical history significant of HTN, history of stroke, PVD, CKD, DM2, diastolic CHF Presented with worsening right upper quadrant pain near syncopal episode today. EMS was called on arrival normotensive CBG 211 Pain started after she ate breakfast today initially was severe but then started to subside. No associated nausea vomiting. She has chronic diarrhea but that has not changed. No dysuria no fevers no chills no chest pain  Hospital Course: Discharge Diagnoses:  Active Problems:   HLD (hyperlipidemia)   Hypertension   Chronic kidney disease   Chronic diastolic CHF (congestive heart failure) (HCC)   RUQ abdominal pain   Postural dizziness with presyncope   RUQ pain   Right upper quadrant abdominal pain/cholelithiasis -patient was admitted to the hospital with symptomatic cholecystitis.  General surgery and gastroenterology were consulted.  She underwent an MRCP which is not entirely clear for choledocholithiasis and she did not have a follow-up ERCP.  Eventually patient was taken to the operating room and underwent a cholecystectomy on 8/24.  An IOC was negative.  She recovered well postop, was evaluated by general surgery and deemed stable for discharge and she was sent home in stable condition.  She has no abdominal pain on discharge, is able to eat without further discomfort/nausea/vomiting Chronic diastolic CHF -euvolemic on discharge, 2D echo done 8/23 with EF of 60-65% with focal  basal septal hypertrophy Chronic kidney disease stage III -Baseline creatinine 1.5-1.8, currently at baseline Hypertension -Blood pressure controlled Presyncope -No evidence of arrhythmias on telemetry, cardiac markers have been negative 3, 2D echo as above. Carotid ultrasound 60 - 79% right ICA stenosis, upper end of scale, 40-59% left ICA stenosis. Wiill need to be seen by vascular surgery as an outpatient Prior stroke -With right-sided weakness, chronic, stable.  Currently her Plavix is on hold, to be resumed as per surgery next week Pancreatic lesion -MRCP showed a 2.4 x 2 cm septated cystic lesion which could be benign versus intraductal papillary mucinous neoplasm.  GI following, recommending repeat MRI as an outpatient in 1 year Chronic lower extremity swelling right more than the left -Venous Doppler without evidence of DVT   Discharge Instructions   Allergies as of 09/16/2016      Reactions   Aspirin Anaphylaxis   Aspirin Nausea And Vomiting   Hurts stomach      Medication List    TAKE these medications   clopidogrel 75 MG tablet Commonly known as:  PLAVIX Take 1 tablet (75 mg total) by mouth daily. Hold until next week, restart on Monday 8/27 What changed:  additional instructions   loratadine 10 MG tablet Commonly known as:  CLARITIN Take 10 mg by mouth daily.   losartan 25 MG tablet Commonly known as:  COZAAR Take 1 tablet (25 mg total) by mouth daily.   Melatonin 300 MCG Tabs Take 1 tablet by mouth at bedtime.   pantoprazole 40 MG tablet Commonly known as:  PROTONIX Take 1 tablet (40 mg total) by mouth daily.   traMADol 50 MG tablet Commonly known as:  ULTRAM Take 1 tablet (50 mg total) by mouth every 12 (twelve) hours as needed for moderate pain.   Vitamin D3 1000 units Caps Take 1,000 Units by mouth daily.            Discharge Care Instructions        Start     Ordered   09/16/16 0000  clopidogrel (PLAVIX) 75 MG tablet  Daily     09/16/16  1019   09/16/16 0000  traMADol (ULTRAM) 50 MG tablet  Every 12 hours PRN     09/16/16 1019     Follow-up Information    Ovidio Kin, MD. Schedule an appointment as soon as possible for a visit in 2 week(s).   Specialty:  General Surgery Contact information: [address] [place] [address] [phone]        Jethro Bastos, MD. Schedule an appointment as soon as possible for a visit in 2 week(s).   Specialty:  Family Medicine Contact information: [address] [place] [address] [phone]        [facility] Follow up.   Why:  Case manager will contact [facility] program [phone] to arrange for needed Home Health services.         Allergies  Allergen Reactions  . Aspirin Anaphylaxis  . Aspirin Nausea And Vomiting    Hurts stomach    Consultations:  Gastroenterology  General surgery  Procedures/Studies:  2D echo  Impressions: - Normal LV size with mild focal basal septal hypertrophy. EF60-65%. Normal RV size and systolic function. No significantvalvular abnormalities.  Ct Abdomen Pelvis Wo Contrast  Result Date: 09/11/2016 CLINICAL DATA:  Abdominal pain and near syncope. EXAM: CT ABDOMEN AND PELVIS WITHOUT CONTRAST TECHNIQUE: Multidetector CT imaging of the abdomen and pelvis was performed following the standard protocol without IV contrast. COMPARISON:  01/30/2016. FINDINGS: Lower chest:  Chronic interstitial changes noted at the lung bases. Hepatobiliary: No focal abnormality in the liver on this study without intravenous contrast. Calcified gallstones again noted. Mild intrahepatic biliary duct dilatation associated with mild fullness of the extrahepatic bile duct, not well demonstrated on this noncontrast exam. Prominence of the portal vein and attenuation likely related to the adjacent distended bile duct has average attenuation in the portal vein is similar to blood pool in the aorta and superior mesenteric vein. Pancreas: Pancreas is  diffusely atrophic. Spleen: No splenomegaly. No focal mass lesion. Adrenals/Urinary Tract: No adrenal nodule or mass. 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 No gross lesion identified in the right kidney. There is mild fullness of the right intrarenal collecting system. No overt right hydroureter. Urinary bladder unremarkable. Stomach/Bowel: Stomach is markedly distended. Duodenum is normally positioned as is the ligament of Treitz. No small bowel wall thickening. No small bowel dilatation. Patient is status post subtotal colectomy. Rectum is prominently distended with stool and air. Vascular/Lymphatic: There is abdominal aortic atherosclerosis without aneurysm. There is no gastrohepatic or hepatoduodenal ligament lymphadenopathy. No intraperitoneal or retroperitoneal lymphadenopathy. No pelvic sidewall lymphadenopathy. Reproductive: Uterus is surgically absent. There is no adnexal mass. Other: No intraperitoneal free fluid. Musculoskeletal: Bone windows reveal no worrisome lytic or sclerotic osseous lesions. Degenerative changes noted lumbar spine. IMPRESSION: 1. Mild intra and extrahepatic biliary duct distention, new in the interval. Correlation with liver function test may prove helpful. 2. Fairly marked distention of the stomach. No obstructing mass identified. Outlet obstruction or gastroparesis could have this appearance. 3. Cholelithiasis. 4. No small bowel dilatation although the rectum is  distended with air and stool in this patient status post subtotal colectomy. 5.  Aortic Atherosclerois (ICD10-170.0) Electronically Signed   By: Kennith Center M.D.   On: 09/11/2016 17:33   Dg Chest 2 View  Result Date: 09/11/2016 CLINICAL DATA:  Near syncopal episode and abdominal pain. EXAM: CHEST  2 VIEW COMPARISON:  08/30/2016 FINDINGS: Normal heart size with aortic atherosclerosis. Mild vascular congestion similar to prior. No effusion or pneumothorax. No pulmonary consolidations. Osteoarthritis of the [facility]  and glenohumeral joints bilaterally. No acute nor suspicious osseous abnormalities. No free air beneath the diaphragm. Moderate gaseous distention of the stomach. IMPRESSION: 1. Mild vascular congestion similar to prior without acute pneumonic consolidation, overt pulmonary edema nor pneumothorax. 2. Aortic atherosclerosis. 3. Moderate gaseous distention of the stomach. Electronically Signed   By: Tollie Eth M.D.   On: 09/11/2016 17:27   Dg Chest 2 View  Result Date: 08/30/2016 CLINICAL DATA:  Weakness EXAM: CHEST  2 VIEW COMPARISON:  None. FINDINGS: Heart is normal in size. Vascular congestion. No consolidation or interstitial edema. No pneumothorax or pleural effusion. IMPRESSION: Vascular congestion without pulmonary edema. Electronically Signed   By: Jolaine Click M.D.   On: 08/30/2016 20:10   Dg Cholangiogram Operative  Result Date: 09/15/2016 CLINICAL DATA:  81 year old female with a history of cholelithiasis EXAM: INTRAOPERATIVE CHOLANGIOGRAM TECHNIQUE: Cholangiographic images from the C-arm fluoroscopic device were submitted for interpretation post-operatively. Please see the procedural report for the amount of contrast and the fluoroscopy time utilized. COMPARISON:  MR CP 09/12/2016 FINDINGS: Surgical instruments project over the upper abdomen. There is cannulation of the cystic duct/gallbladder neck, with antegrade infusion of contrast. Caliber of the extrahepatic ductal system within normal limits. No large filling defect identified. Free flow of contrast across the ampulla. IMPRESSION: Intraoperative cholangiogram demonstrates extrahepatic biliary ducts of unremarkable caliber, with no large filling defect identified. Free flow of contrast across the ampulla. Please refer to the dictated operative report for full details of intraoperative findings and procedure Electronically Signed   By: Gilmer Mor D.O.   On: 09/15/2016 15:33   Ct Head Wo Contrast  Result Date: 08/30/2016 CLINICAL DATA:  81  y/o  F; headaches and dizziness. EXAM: CT HEAD WITHOUT CONTRAST TECHNIQUE: Contiguous axial images were obtained from the base of the skull through the vertex without intravenous contrast. COMPARISON:  None. FINDINGS: Brain: Multiple areas of hypoattenuation with loss of gray-white differentiation in the right occipital lobe, left parietal lobe, and bilateral frontal lobes compatible with areas of infarction. Cavum septum pellucidum. No hydrocephalus, acute hemorrhage, or extra-axial collection. No focal mass effect Vascular: No hyperdense vessel identified. Extensive calcific atherosclerosis of carotid siphons. Skull: Normal. Negative for fracture or focal lesion. Sinuses/Orbits: No acute finding. Other: None. IMPRESSION: Multiple cortical infarctions in right occipital, left parietal, and bilateral frontal lobes. Infarcts have variable attenuation and volume loss compatible with infarcts of differing ages. No infarct appears acute, but subacute infarcts are possible. Consider MRI of the brain for further characterization. Electronically Signed   By: Mitzi Hansen M.D.   On: 08/30/2016 19:39   Mr Brain Wo Contrast  Result Date: 08/30/2016 CLINICAL DATA:  Weakness, and dizziness and headache beginning at 12:30 today. History of stroke and residual RIGHT-sided deficits and speech difficulties. On blood thinners. EXAM: MRI HEAD WITHOUT CONTRAST TECHNIQUE: Multiplanar, multiecho pulse sequences of the brain and surrounding structures were obtained without intravenous contrast. COMPARISON:  CT HEAD August 30, 2016 and CT HEAD May 01, 2016 and MRI of the head  January 09, 2012 FINDINGS: BRAIN: No reduced diffusion to suggest acute ischemia. RIGHT temporal occipital, LEFT occipital, bifrontal and LEFT parietal encephalomalacia present on prior MRI. Old small bilateral cerebellar infarcts. Scant susceptibility artifact LEFT parietal lobe unchanged compatible with mineralization. Cavum septum pellucidum.  Moderate ventriculomegaly on the basis of global parenchymal brain volume loss. Patchy supratentorial could matter FLAIR T2 hyperintensities. No midline shift, mass effect or masses. No abnormal extra-axial fluid collections. 6 mm pineal cyst. VASCULAR: Normal major intracranial vascular flow voids present at skull base. SKULL AND UPPER CERVICAL SPINE: Empty sella. No suspicious calvarial bone marrow signal. Craniocervical junction maintained. SINUSES/ORBITS: The mastoid air-cells and included paranasal sinuses are well-aerated. The included ocular globes and orbital contents are non-suspicious. Status post RIGHT ocular lens implant. OTHER: Patient is edentulous. IMPRESSION: 1. No acute intracranial process. 2. Stable old LEFT anterior cerebral artery, bilateral MCA, bilateral PCA territory infarcts. Old small cerebellar infarcts. 3. Stable moderate parenchymal brain volume loss for age and, mild to moderate chronic small vessel ischemic disease. Acute findings discussed with and reconfirmed by Dr.CAMERON ISAACS on 08/30/2016 at 11:35 pm. Electronically Signed   By: Awilda Metro M.D.   On: 08/30/2016 23:35   Mr Abdomen Mrcp Wo Contrast  Result Date: 09/13/2016 CLINICAL DATA:  Cholelithiasis and biliary dilatation EXAM: MRI ABDOMEN WITHOUT CONTRAST  (INCLUDING MRCP) TECHNIQUE: Multiplanar multisequence MR imaging of the abdomen was performed. Heavily T2-weighted images of the biliary and pancreatic ducts were obtained, and three-dimensional MRCP images were rendered by post processing. COMPARISON:  Multiple exams, including 01/30/2016 and ultrasound of 09/09/2016 FINDINGS: Despite efforts by the technologist and patient, motion artifact is present on today's exam and could not be eliminated. This reduces exam sensitivity and specificity. Lower chest: Unremarkable Hepatobiliary: Mild biliary dilatation and the left hepatic duct near the confluence. The common hepatic duct measures 10 mm in diameter in the  common bile duct 7 mm in diameter. New there is considerable narrowing and indistinct signal in the common bile duct as it new extends along the pancreatic head. New enlarging adjacent new septated lesion of the pancreatic head may be contributory. Slightly beaded appearance of the CBD in this vicinity for example on image 39/6, although this irregularity is less striking for example on image 12/8 and is probably partially attributable to motion artifact. Blunted distal margin of the common bile duct for example on image 8/8, previously the distal CBD had a more conical morphology. Several gallstones are present measuring up to 1.6 cm in diameter. Pancreas: A septated cystic lesion along the pancreatic head measures 2.4 by 2.0 cm on image 19/5, and previously measured 1.7 by 1.8 cm by my measurements. Spleen:  Unremarkable Adrenals/Urinary Tract:  Small left pelvic kidney with non rotation. Stomach/Bowel: Gaseous prominence of the distal sigmoid colon. Vascular/Lymphatic:  Aortoiliac atherosclerotic vascular disease. Other:  No supplemental non-categorized findings. Musculoskeletal: Degenerative endplate findings at L5-S1. IMPRESSION: 1. Moderate enlargement of a septated cystic lesion in the pancreatic head. Current size 2.4 by 2.0 cm. This could be a benign cystic pancreatic lesion or intraductal papillary mucinous neoplasm. Based on current guidelines for patients under 56 years old, and the current size of the lesion, options going forward include a six-month follow up MRI, or EUS/fine-needle aspiration. This recommendation follows ACR consensus guidelines: Management of Incidental Pancreatic Cysts: A White Paper of the ACR Incidental Findings Committee. J Am Coll Radiol 2017;14:911-923. 2. Mild common hepatic duct and left hepatic duct dilatation. There is some indistinctness of the common bile  duct in the vicinity of the pancreatic cyst which may be from mild extrinsic narrowing. Slight irregularity on some  sequences in this vicinity is probably attributable to motion artifact rather than filling defect. However, the CBD terminates in a somewhat blunted end, rather than the prior conical and. This makes it difficult to exclude a small distal CBD stone in the ampulla. However, given the lack of dilatation of the CBD itself, I am skeptical of choledocholithiasis. 3. Cholelithiasis is present. 4.  Aortic Atherosclerosis (ICD10-I70.0). 5. Gaseous prominence of the sigmoid colon. 6. Small left pelvic kidney. Electronically Signed   By: Gaylyn Rong M.D.   On: 09/13/2016 08:56   Mr 3d Recon At Scanner  Result Date: 09/13/2016 CLINICAL DATA:  Cholelithiasis and biliary dilatation EXAM: MRI ABDOMEN WITHOUT CONTRAST  (INCLUDING MRCP) TECHNIQUE: Multiplanar multisequence MR imaging of the abdomen was performed. Heavily T2-weighted images of the biliary and pancreatic ducts were obtained, and three-dimensional MRCP images were rendered by post processing. COMPARISON:  Multiple exams, including 01/30/2016 and ultrasound of 09/09/2016 FINDINGS: Despite efforts by the technologist and patient, motion artifact is present on today's exam and could not be eliminated. This reduces exam sensitivity and specificity. Lower chest: Unremarkable Hepatobiliary: Mild biliary dilatation and the left hepatic duct near the confluence. The common hepatic duct measures 10 mm in diameter in the common bile duct 7 mm in diameter. New there is considerable narrowing and indistinct signal in the common bile duct as it new extends along the pancreatic head. New enlarging adjacent new septated lesion of the pancreatic head may be contributory. Slightly beaded appearance of the CBD in this vicinity for example on image 39/6, although this irregularity is less striking for example on image 12/8 and is probably partially attributable to motion artifact. Blunted distal margin of the common bile duct for example on image 8/8, previously the distal  CBD had a more conical morphology. Several gallstones are present measuring up to 1.6 cm in diameter. Pancreas: A septated cystic lesion along the pancreatic head measures 2.4 by 2.0 cm on image 19/5, and previously measured 1.7 by 1.8 cm by my measurements. Spleen:  Unremarkable Adrenals/Urinary Tract:  Small left pelvic kidney with non rotation. Stomach/Bowel: Gaseous prominence of the distal sigmoid colon. Vascular/Lymphatic:  Aortoiliac atherosclerotic vascular disease. Other:  No supplemental non-categorized findings. Musculoskeletal: Degenerative endplate findings at L5-S1. IMPRESSION: 1. Moderate enlargement of a septated cystic lesion in the pancreatic head. Current size 2.4 by 2.0 cm. This could be a benign cystic pancreatic lesion or intraductal papillary mucinous neoplasm. Based on current guidelines for patients under 53 years old, and the current size of the lesion, options going forward include a six-month follow up MRI, or EUS/fine-needle aspiration. This recommendation follows ACR consensus guidelines: Management of Incidental Pancreatic Cysts: A White Paper of the ACR Incidental Findings Committee. J Am Coll Radiol 2017;14:911-923. 2. Mild common hepatic duct and left hepatic duct dilatation. There is some indistinctness of the common bile duct in the vicinity of the pancreatic cyst which may be from mild extrinsic narrowing. Slight irregularity on some sequences in this vicinity is probably attributable to motion artifact rather than filling defect. However, the CBD terminates in a somewhat blunted end, rather than the prior conical and. This makes it difficult to exclude a small distal CBD stone in the ampulla. However, given the lack of dilatation of the CBD itself, I am skeptical of choledocholithiasis. 3. Cholelithiasis is present. 4.  Aortic Atherosclerosis (ICD10-I70.0). 5. Gaseous prominence of the  sigmoid colon. 6. Small left pelvic kidney. Electronically Signed   By: Gaylyn Rong  M.D.   On: 09/13/2016 08:56   US Abdomen Limited Ruq  Result Date: 09/11/2016 CLINICAL DATA:  Right upper quadrant pain. EXAM: ULTRASOUND ABDOMEN LIMITED RIGHT UPPER QUADRANT COMPARISON:  None. FINDINGS: Gallbladder: Multiple stones in the gallbladder. Sludge identified as well. The wall thickness is increased measuring 6.9 mm. No Murphy's sign. No pericholecystic fluid. Common bile duct: Diameter: 12.2 mm proximally. Shadowing bowel gas obscures the distal portion. Liver: Intrahepatic ductal dilatation. No focal mass. Portal vein is patent on color Doppler imaging with normal direction of blood flow towards the liver. IMPRESSION: 1. Cholelithiasis, sludge, and wall thickening with no pericholecystic fluid or Murphy's sign. 2. Intra and extrahepatic biliary duct dilatation. An underlying cause is not identified. Recommend an MRCP to better evaluate the common bile duct for choledocholithiasis or obstructing lesion. Electronically Signed   By: Gerome Sam III M.D   On: 09/11/2016 19:48      Subjective: - no chest pain, shortness of breath, no abdominal pain, nausea or vomiting.   Discharge Exam: Vitals:   09/16/16 1000 09/16/16 1400  BP:  110/62  Pulse:  (!) 106  Resp:  20  Temp: (!) 97 F (36.1 C) 97.8 F (36.6 C)  SpO2:     Vitals:   09/16/16 0529 09/16/16 0814 09/16/16 1000 09/16/16 1400  BP: (!) 119/52   110/62  Pulse: 89   (!) 106  Resp: 16   20  Temp: 98.5 F (36.9 C)  (!) 97 F (36.1 C) 97.8 F (36.6 C)  TempSrc: Oral   Oral  SpO2: 98% 100%    Weight:      Height:       General: Pt is alert, awake, not in acute distress Cardiovascular: RRR, S1/S2 +, no rubs, no gallops Respiratory: CTA bilaterally, no wheezing, no rhonchi   The results of significant diagnostics from this hospitalization (including imaging, microbiology, ancillary and laboratory) are listed below for reference.     Microbiology: Recent Results (from the past 240 hour(s))  Urine culture      Status: Abnormal   Collection Time: 09/11/16  4:08 PM  Result Value Ref Range Status   Specimen Description URINE, CLEAN CATCH  Final   Special Requests NONE  Final   Culture MULTIPLE SPECIES PRESENT, SUGGEST RECOLLECTION (A)  Final   Report Status 09/13/2016 FINAL  Final  Surgical pcr screen     Status: None   Collection Time: 09/15/16  7:25 AM  Result Value Ref Range Status   MRSA, PCR NEGATIVE NEGATIVE Final   Staphylococcus aureus NEGATIVE NEGATIVE Final    Comment:        The Xpert SA Assay (FDA approved for NASAL specimens in patients over 70 years of age), is one component of a comprehensive surveillance program.  Test performance has been validated by [facility] for patients greater than or equal to 22 year old. It is not intended to diagnose infection nor to guide or monitor treatment.      Labs: BNP (last 3 results)  Recent Labs  05/01/16 0515  BNP 106.5*   Basic Metabolic Panel:  Recent Labs Lab 09/11/16 1538 09/12/16 0554 09/14/16 0922 09/15/16 0703 09/16/16 0540  NA 138 137 140 136 137  K 4.2 3.8 4.5 5.0 4.7  CL 105 106 110 107 104  CO2 26 26 25 24 26   GLUCOSE 124* 87 83 84 96  BUN 23* 20  17 25* 26*  CREATININE 1.67* 1.53* 1.55* 1.64* 1.63*  CALCIUM 8.9 8.8* 8.8* 8.6* 8.3*  MG  --  2.0  --   --   --   PHOS  --  3.4  --   --   --    Liver Function Tests:  Recent Labs Lab 09/11/16 1538 09/12/16 0554 09/14/16 0922 09/16/16 0540  AST 25 22 20  45*  ALT 14 13* 12* 29  ALKPHOS 63 54 51 44  BILITOT 0.2* 0.7 0.5 0.3  PROT 6.0* 5.8* 5.7* 5.4*  ALBUMIN 3.2* 3.1* 3.0* 2.6*    Recent Labs Lab 09/11/16 1538  LIPASE 37   No results for input(s): AMMONIA in the last 168 hours. CBC:  Recent Labs Lab 09/11/16 1538 09/12/16 1355 09/14/16 0922 09/15/16 0703 09/16/16 0540  WBC 4.7 5.0 4.3 4.7 7.5  NEUTROABS 3.6  --   --   --   --   HGB 10.5* 11.0* 10.6* 10.4* 9.2*  HCT 31.8* 33.4* 32.1* 32.2* 27.7*  MCV 86.2 83.5 85.1 85.0 85.0  PLT  200 180 185 189 164   Cardiac Enzymes:  Recent Labs Lab 09/11/16 2306 09/12/16 0554  TROPONINI <0.03 <0.03   BNP: Invalid input(s): POCBNP CBG:  Recent Labs Lab 09/16/16 0019 09/16/16 0528 09/16/16 0758 09/16/16 1217 09/16/16 1642  GLUCAP 118* 115* 98 117* 116*   D-Dimer No results for input(s): DDIMER in the last 72 hours. Hgb A1c No results for input(s): HGBA1C in the last 72 hours. Lipid Profile No results for input(s): CHOL, HDL, LDLCALC, TRIG, CHOLHDL, LDLDIRECT in the last 72 hours. Thyroid function studies No results for input(s): TSH, T4TOTAL, T3FREE, THYROIDAB in the last 72 hours.  Invalid input(s): FREET3 Anemia work up No results for input(s): VITAMINB12, FOLATE, FERRITIN, TIBC, IRON, RETICCTPCT in the last 72 hours. Urinalysis    Component Value Date/Time   COLORURINE YELLOW 09/11/2016 1608   APPEARANCEUR CLEAR 09/11/2016 1608   LABSPEC 1.008 09/11/2016 1608   PHURINE 5.0 09/11/2016 1608   GLUCOSEU NEGATIVE 09/11/2016 1608   HGBUR NEGATIVE 09/11/2016 1608   BILIRUBINUR NEGATIVE 09/11/2016 1608   KETONESUR NEGATIVE 09/11/2016 1608   PROTEINUR NEGATIVE 09/11/2016 1608   UROBILINOGEN 0.2 05/27/2012 1416   NITRITE NEGATIVE 09/11/2016 1608   LEUKOCYTESUR TRACE (A) 09/11/2016 1608   Sepsis Labs Invalid input(s): PROCALCITONIN,  WBC,  LACTICIDVEN Microbiology Recent Results (from the past 240 hour(s))  Urine culture     Status: Abnormal   Collection Time: 09/11/16  4:08 PM  Result Value Ref Range Status   Specimen Description URINE, CLEAN CATCH  Final   Special Requests NONE  Final   Culture MULTIPLE SPECIES PRESENT, SUGGEST RECOLLECTION (A)  Final   Report Status 09/13/2016 FINAL  Final  Surgical pcr screen     Status: None   Collection Time: 09/15/16  7:25 AM  Result Value Ref Range Status   MRSA, PCR NEGATIVE NEGATIVE Final   Staphylococcus aureus NEGATIVE NEGATIVE Final    Comment:        The Xpert SA Assay (FDA approved for NASAL  specimens in patients over 64 years of age), is one component of a comprehensive surveillance program.  Test performance has been validated by [facility] for patients greater than or equal to 11 year old. It is not intended to diagnose infection nor to guide or monitor treatment.      Time coordinating discharge: 40 minutes  SIGNED:  Pamella Pert, MD  [facility] 09/16/2016, 6:03 PM Pager [phone]  If 7PM-7AM, please contact night-coverage [email or web address] Password [email or web address]

## 2016-09-16 NOTE — Progress Notes (Signed)
Writer talked with pt's daughter at this time. Pt will be discharged about 5 pm today with her daughter.

## 2016-09-16 NOTE — Progress Notes (Signed)
Pt leaving hospital at this time with her daughter. Alert, oriented, and without c/o. Discharge instructions/prescription given/explained with pt and her daughter verbalizing understanding.  Pt will followup with Dr. Ezzard Standing and [facility].

## 2016-09-16 NOTE — Progress Notes (Signed)
Patient ID: [name], female   DOB: 10/04/1935, 81 y.o.   MRN: [id number] 1 Day Post-Op   Subjective: She denies abdominal pain. He is hungry and eating breakfast.  Objective: Vital signs in last 24 hours: Temp:  [95 F (35 C)-98.7 F (37.1 C)] 98.5 F (36.9 C) (08/25 0529) Pulse Rate:  [69-89] 89 (08/25 0529) Resp:  [12-19] 16 (08/25 0529) BP: (114-138)/(49-67) 119/52 (08/25 0529) SpO2:  [98 %-100 %] 100 % (08/25 0814) Last BM Date: 09/14/16  Intake/Output from previous day: 08/24 0701 - 08/25 0700 In: 1750 [I.V.:1700; IV Piggyback:50] Out: 835 [Urine:825; Blood:10] Intake/Output this shift: No intake/output data recorded.  General appearance: alert, cooperative and no distress GI: Soft and nontender and nondistended Incision/Wound: No erythema or drainage  Lab Results:   Recent Labs  09/15/16 0703 09/16/16 0540  WBC 4.7 7.5  HGB 10.4* 9.2*  HCT 32.2* 27.7*  PLT 189 164   BMET  Recent Labs  09/15/16 0703 09/16/16 0540  NA 136 137  K 5.0 4.7  CL 107 104  CO2 24 26  GLUCOSE 84 96  BUN 25* 26*  CREATININE 1.64* 1.63*  CALCIUM 8.6* 8.3*     Studies/Results: Dg Cholangiogram Operative  Result Date: 09/15/2016 CLINICAL DATA:  81 year old female with a history of cholelithiasis EXAM: INTRAOPERATIVE CHOLANGIOGRAM TECHNIQUE: Cholangiographic images from the C-arm fluoroscopic device were submitted for interpretation post-operatively. Please see the procedural report for the amount of contrast and the fluoroscopy time utilized. COMPARISON:  MR CP 09/12/2016 FINDINGS: Surgical instruments project over the upper abdomen. There is cannulation of the cystic duct/gallbladder neck, with antegrade infusion of contrast. Caliber of the extrahepatic ductal system within normal limits. No large filling defect identified. Free flow of contrast across the ampulla. IMPRESSION: Intraoperative cholangiogram demonstrates extrahepatic biliary ducts of unremarkable caliber,  with no large filling defect identified. Free flow of contrast across the ampulla. Please refer to the dictated operative report for full details of intraoperative findings and procedure Electronically Signed   By: Gilmer Mor D.O.   On: 09/15/2016 15:33    Anti-infectives: Anti-infectives    Start     Dose/Rate Route Frequency Ordered Stop   09/15/16 0500  cefTRIAXone (ROCEPHIN) 2 g in dextrose 5 % 50 mL IVPB     2 g 100 mL/hr over 30 Minutes Intravenous Every 24 hours 09/14/16 1011        Assessment/Plan: s/p Procedure(s): LAPAROSCOPIC CHOLECYSTECTOMY WITH INTRAOPERATIVE CHOLANGIOGRAM Appears to be doing well following surgery without complication. From a surgical standpoint she appears okay for discharge today.   LOS: 4 days    Tametha Banning T 09/16/2016

## 2016-09-16 NOTE — Discharge Instructions (Signed)
Follow with Jethro Bastos, MD in 5-7 days  Resume Plavix Monday  Please get a complete blood count and chemistry panel checked by your Primary MD at your next visit, and again as instructed by your Primary MD. Please get your medications reviewed and adjusted by your Primary MD.  Please request your Primary MD to go over all Hospital Tests and Procedure/Radiological results at the follow up, please get all Hospital records sent to your Prim MD by signing hospital release before you go home.  If you had Pneumonia of Lung problems at the Hospital: Please get a 2 view Chest X ray done in 6-8 weeks after hospital discharge or sooner if instructed by your Primary MD.  If you have Congestive Heart Failure: Please call your Cardiologist or Primary MD anytime you have any of the following symptoms:  1) 3 pound weight gain in 24 hours or 5 pounds in 1 week  2) shortness of breath, with or without a dry hacking cough  3) swelling in the hands, feet or stomach  4) if you have to sleep on extra pillows at night in order to breathe  Follow cardiac low salt diet and 1.5 lit/day fluid restriction.  If you have diabetes Accuchecks 4 times/day, Once in AM empty stomach and then before each meal. Log in all results and show them to your primary doctor at your next visit. If any glucose reading is under 80 or above 300 call your primary MD immediately.  If you have Seizure/Convulsions/Epilepsy: Please do not drive, operate heavy machinery, participate in activities at heights or participate in high speed sports until you have seen by Primary MD or a Neurologist and advised to do so again.  If you had Gastrointestinal Bleeding: Please ask your Primary MD to check a complete blood count within one week of discharge or at your next visit. Your endoscopic/colonoscopic biopsies that are pending at the time of discharge, will also need to followed by your Primary MD.  Get Medicines reviewed and  adjusted. Please take all your medications with you for your next visit with your Primary MD  Please request your Primary MD to go over all hospital tests and procedure/radiological results at the follow up, please ask your Primary MD to get all Hospital records sent to his/her office.  If you experience worsening of your admission symptoms, develop shortness of breath, life threatening emergency, suicidal or homicidal thoughts you must seek medical attention immediately by calling 911 or calling your MD immediately  if symptoms less severe.  You must read complete instructions/literature along with all the possible adverse reactions/side effects for all the Medicines you take and that have been prescribed to you. Take any new Medicines after you have completely understood and accpet all the possible adverse reactions/side effects.   Do not drive or operate heavy machinery when taking Pain medications.   Do not take more than prescribed Pain, Sleep and Anxiety Medications  Special Instructions: If you have smoked or chewed Tobacco  in the last 2 yrs please stop smoking, stop any regular Alcohol  and or any Recreational drug use.  Wear Seat belts while driving.  Please note You were cared for by a hospitalist during your hospital stay. If you have any questions about your discharge medications or the care you received while you were in the hospital after you are discharged, you can call the unit and asked to speak with the hospitalist on call if the hospitalist that took care of  you is not available. Once you are discharged, your primary care physician will handle any further medical issues. Please note that NO REFILLS for any discharge medications will be authorized once you are discharged, as it is imperative that you return to your primary care physician (or establish a relationship with a primary care physician if you do not have one) for your aftercare needs so that they can reassess your need  for medications and monitor your lab values.  You can reach the hospitalist office at phone [phone] or fax [phone]   If you do not have a primary care physician, you can call [phone] for a physician referral.  Activity: As tolerated with Full fall precautions use walker/cane & assistance as needed  Diet: regular  Disposition Home

## 2016-09-18 NOTE — Care Management (Signed)
Case manager spoke with Porsche, Case manager at [facility] concerning patient's discharge needs. She informed CM that their care co-ordinator will be assessing patient and arranging for any Home Health needs.

## 2017-03-15 ENCOUNTER — Emergency Department (HOSPITAL_COMMUNITY)
Admission: EM | Admit: 2017-03-15 | Discharge: 2017-03-16 | Disposition: A | Discharge status: Home / Self Care | Payer: Medicare (Managed Care) | Attending: Emergency Medicine | Admitting: Emergency Medicine

## 2017-03-15 ENCOUNTER — Emergency Department (HOSPITAL_COMMUNITY): Payer: Medicare (Managed Care)

## 2017-03-15 ENCOUNTER — Other Ambulatory Visit: Payer: Self-pay

## 2017-03-15 ENCOUNTER — Encounter (HOSPITAL_COMMUNITY): Payer: Self-pay

## 2017-03-15 DIAGNOSIS — E1122 Type 2 diabetes mellitus with diabetic chronic kidney disease: Secondary | ICD-10-CM | POA: Insufficient documentation

## 2017-03-15 DIAGNOSIS — R350 Frequency of micturition: Secondary | ICD-10-CM | POA: Insufficient documentation

## 2017-03-15 DIAGNOSIS — R05 Cough: Secondary | ICD-10-CM | POA: Insufficient documentation

## 2017-03-15 DIAGNOSIS — Z79899 Other long term (current) drug therapy: Secondary | ICD-10-CM | POA: Insufficient documentation

## 2017-03-15 DIAGNOSIS — R55 Syncope and collapse: Secondary | ICD-10-CM | POA: Diagnosis present

## 2017-03-15 DIAGNOSIS — R0981 Nasal congestion: Secondary | ICD-10-CM | POA: Diagnosis not present

## 2017-03-15 DIAGNOSIS — W1830XA Fall on same level, unspecified, initial encounter: Secondary | ICD-10-CM | POA: Diagnosis not present

## 2017-03-15 DIAGNOSIS — R51 Headache: Secondary | ICD-10-CM | POA: Insufficient documentation

## 2017-03-15 DIAGNOSIS — N183 Chronic kidney disease, stage 3 (moderate): Secondary | ICD-10-CM | POA: Insufficient documentation

## 2017-03-15 DIAGNOSIS — E860 Dehydration: Secondary | ICD-10-CM | POA: Diagnosis not present

## 2017-03-15 DIAGNOSIS — W19XXXA Unspecified fall, initial encounter: Secondary | ICD-10-CM

## 2017-03-15 DIAGNOSIS — I5032 Chronic diastolic (congestive) heart failure: Secondary | ICD-10-CM | POA: Diagnosis not present

## 2017-03-15 DIAGNOSIS — I129 Hypertensive chronic kidney disease with stage 1 through stage 4 chronic kidney disease, or unspecified chronic kidney disease: Secondary | ICD-10-CM | POA: Insufficient documentation

## 2017-03-15 LAB — COMPREHENSIVE METABOLIC PANEL
ALK PHOS: 69 U/L (ref 38–126)
ALT: 13 U/L — AB (ref 14–54)
AST: 23 U/L (ref 15–41)
Albumin: 3.5 g/dL (ref 3.5–5.0)
Anion gap: 9 (ref 5–15)
BILIRUBIN TOTAL: 0.4 mg/dL (ref 0.3–1.2)
BUN: 33 mg/dL — AB (ref 6–20)
CALCIUM: 8.9 mg/dL (ref 8.9–10.3)
CO2: 24 mmol/L (ref 22–32)
CREATININE: 1.61 mg/dL — AB (ref 0.44–1.00)
Chloride: 105 mmol/L (ref 101–111)
GFR, EST AFRICAN AMERICAN: 34 mL/min — AB (ref >60)
GFR, EST NON AFRICAN AMERICAN: 29 mL/min — AB (ref >60)
Glucose, Bld: 178 mg/dL — ABNORMAL HIGH (ref 65–99)
Potassium: 4.1 mmol/L (ref 3.5–5.1)
Sodium: 138 mmol/L (ref 135–145)
TOTAL PROTEIN: 6.5 g/dL (ref 6.5–8.1)

## 2017-03-15 LAB — CBC WITH DIFFERENTIAL/PLATELET
BASOS ABS: 0 10*3/uL (ref 0.0–0.1)
Basophils Relative: 0 %
EOS PCT: 1 %
Eosinophils Absolute: 0.1 10*3/uL (ref 0.0–0.7)
HEMATOCRIT: 36.4 % (ref 36.0–46.0)
Hemoglobin: 11.6 g/dL — ABNORMAL LOW (ref 12.0–15.0)
LYMPHS ABS: 0.8 10*3/uL (ref 0.7–4.0)
LYMPHS PCT: 17 %
MCH: 28 pg (ref 26.0–34.0)
MCHC: 31.9 g/dL (ref 30.0–36.0)
MCV: 87.9 fL (ref 78.0–100.0)
Monocytes Absolute: 0.2 10*3/uL (ref 0.1–1.0)
Monocytes Relative: 5 %
NEUTROS ABS: 3.7 10*3/uL (ref 1.7–7.7)
Neutrophils Relative %: 77 %
PLATELETS: 198 10*3/uL (ref 150–400)
RBC: 4.14 MIL/uL (ref 3.87–5.11)
RDW: 14.3 % (ref 11.5–15.5)
WBC: 4.8 10*3/uL (ref 4.0–10.5)

## 2017-03-15 LAB — URINALYSIS, ROUTINE W REFLEX MICROSCOPIC
BILIRUBIN URINE: NEGATIVE
Glucose, UA: NEGATIVE mg/dL
Hgb urine dipstick: NEGATIVE
Ketones, ur: NEGATIVE mg/dL
NITRITE: NEGATIVE
PH: 5 (ref 5.0–8.0)
Protein, ur: NEGATIVE mg/dL
SPECIFIC GRAVITY, URINE: 1.014 (ref 1.005–1.030)

## 2017-03-15 LAB — I-STAT TROPONIN, ED: TROPONIN I, POC: 0 ng/mL (ref 0.00–0.08)

## 2017-03-15 LAB — I-STAT BETA HCG BLOOD, ED (MC, WL, AP ONLY): HCG, QUANTITATIVE: 7.5 m[IU]/mL — AB (ref <5)

## 2017-03-15 LAB — MAGNESIUM: Magnesium: 2.3 mg/dL (ref 1.7–2.4)

## 2017-03-15 MED ORDER — ACETAMINOPHEN 325 MG PO TABS
650.0000 mg | ORAL_TABLET | Freq: Once | ORAL | Status: AC
  Administered 2017-03-15: 650 mg via ORAL
  Filled 2017-03-15: qty 2

## 2017-03-15 MED ORDER — FOSFOMYCIN TROMETHAMINE 3 G PO PACK
3.0000 g | PACK | Freq: Once | ORAL | Status: AC
  Administered 2017-03-15: 3 g via ORAL
  Filled 2017-03-15: qty 3

## 2017-03-15 NOTE — ED Provider Notes (Signed)
[facility]-EMERGENCY DEPT Provider Note   CSN: [id number] Arrival date & time: 03/15/17  1615     History   Chief Complaint Chief Complaint  Patient presents with  . Fall    HPI [name] is a 82 y.o. female.  The history is provided by the patient and medical records. No language interpreter was used.  Loss of Consciousness   This is a new problem. The current episode started less than 1 hour ago. The problem occurs rarely. The problem has been resolved. She lost consciousness for a period of less than one minute. The problem is associated with normal activity. Associated symptoms include congestion, headaches and weakness (at baselne). Pertinent negatives include abdominal pain, back pain, chest pain, diaphoresis, dizziness, fever, focal weakness, light-headedness, malaise/fatigue, nausea, palpitations, seizures, visual change and vomiting. She has tried nothing for the symptoms. The treatment provided no relief. Her past medical history is significant for CAD, CVA and HTN.  Fall  This is a new problem. The current episode started less than 1 hour ago. The problem occurs rarely. The problem has been resolved. Associated symptoms include headaches. Pertinent negatives include no chest pain, no abdominal pain and no shortness of breath. Nothing aggravates the symptoms. Nothing relieves the symptoms.    Past Medical History:  Diagnosis Date  . Arthritis    hands  . Asthma   . Chronic diastolic CHF (congestive heart failure) (HCC)   . Chronic kidney disease (CKD), stage III (moderate) (HCC)   . Dementia   . Depression   . Diabetes mellitus without complication (HCC)   . Diverticulosis   . Hx of adenomatous colonic polyps   . Hyperlipidemia   . Hypertension   . Hypochromic anemia   . Internal hemorrhoids   . Stroke [facility]) May 2009   multiple, last one Nov 2012, 2 strokes with right sided deficits and slurred speech  . Stroke [facility])     Patient  Active Problem List   Diagnosis Date Noted  . RUQ abdominal pain 09/11/2016  . Postural dizziness with presyncope 09/11/2016  . RUQ pain 09/11/2016  . Stroke (cerebrum) (HCC) 04/30/2016  . Hyperkalemia 04/30/2016  . Acute encephalopathy 04/30/2016  . Chronic diastolic CHF (congestive heart failure) (HCC) 04/30/2016  . Multiple duodenal ulcers   . RLQ abdominal pain   . Gallstones   . Elevated liver enzymes   . Abnormal finding on GI tract imaging   . Pancreas cyst   . Choledocholithiasis 01/30/2016  . Acute kidney injury (HCC) 01/30/2016  . Non-intractable vomiting with nausea   . Bleeding 11/23/2015  . Chronic kidney disease 05/29/2012  . Skin ulcer, stage 2 (HCC) 05/29/2012  . Internal hemorrhoid, bleeding 05/29/2012  . Severe protein-calorie malnutrition (HCC) 03/24/2012  . Hyponatremia 03/23/2012  . Hypokalemia 03/23/2012  . Urinary tract infection 03/23/2012  . Physical deconditioning 03/23/2012  . CKD (chronic kidney disease) stage 3, GFR 30-59 ml/min (HCC) 12/01/2010  . Encephalopathy 12/01/2010  . TIA (transient ischemic attack) 11/28/2010  . ASTHMA 08/25/2008  . COLONIC POLYPS, ADENOMATOUS, HX OF 08/25/2008  . HLD (hyperlipidemia) 08/17/2008  . Microcytic hypochromic anemia 08/17/2008  . Hypertension 08/17/2008  . History of CVA (cerebrovascular accident) 08/17/2008  . DIVERTICULITIS, COLON 08/17/2008  . HYPERGLYCEMIA 08/17/2008    Past Surgical History:  Procedure Laterality Date  . ABDOMINAL HYSTERECTOMY    . ABDOMINAL SURGERY    . BOWEL RESECTION    . CHOLECYSTECTOMY N/A 09/15/2016   Procedure: LAPAROSCOPIC CHOLECYSTECTOMY WITH INTRAOPERATIVE  CHOLANGIOGRAM;  Surgeon: Ovidio Kin, MD;  Location: [facility] ORS;  Service: General;  Laterality: N/A;  . COLECTOMY    . ESOPHAGOGASTRODUODENOSCOPY N/A 02/01/2016   Procedure: ESOPHAGOGASTRODUODENOSCOPY (EGD);  Surgeon: Beverley Fiedler, MD;  Location: [facility] ENDOSCOPY;  Service: Endoscopy;  Laterality: N/A;  . FLEXIBLE  SIGMOIDOSCOPY N/A 05/28/2012   Procedure: FLEXIBLE SIGMOIDOSCOPY;  Surgeon: Hart Carwin, MD;  Location: [facility] ENDOSCOPY;  Service: Endoscopy;  Laterality: N/A;  . OTHER SURGICAL HISTORY     2 ear surgeries  . PERIPHERAL VASCULAR CATHETERIZATION N/A 07/21/2014   Procedure: Abdominal Aortogram w/Lower Extremity;  Surgeon: Nada Libman, MD;  Location: [facility] INVASIVE CV LAB;  Service: Cardiovascular;  Laterality: N/A;  . PERIPHERAL VASCULAR CATHETERIZATION N/A 11/23/2015   Procedure: Abdominal Aortogram w/Lower Extremity;  Surgeon: Nada Libman, MD;  Location: [facility] INVASIVE CV LAB;  Service: Cardiovascular;  Laterality: N/A;  . PERIPHERAL VASCULAR CATHETERIZATION  11/23/2015   Procedure: Peripheral Vascular Intervention;  Surgeon: Nada Libman, MD;  Location: [facility] INVASIVE CV LAB;  Service: Cardiovascular;;  Failed PVI of AT and Peroneal on left side  . SUBTOTAL COLECTOMY  2010   for stricturing from bouts of diverticulitis along with hx recurrent diverticular bleeds.  the surgery was a total abdominal colec  . TONSILLECTOMY    . VASCULAR SURGERY      OB History    No data available       Home Medications    Prior to Admission medications   Medication Sig Start Date End Date Taking? Authorizing Provider  Cholecalciferol (VITAMIN D3) 1000 UNITS CAPS Take 1,000 Units by mouth daily.     [provider]  clopidogrel (PLAVIX) 75 MG tablet Take 1 tablet (75 mg total) by mouth daily. Hold until next week, restart on Monday 8/27 09/16/16   Leatha Gilding, MD  loratadine (CLARITIN) 10 MG tablet Take 10 mg by mouth daily.     [provider]  losartan (COZAAR) 25 MG tablet Take 1 tablet (25 mg total) by mouth daily. 02/03/16   Albertine Grates, MD  Melatonin 300 MCG TABS Take 1 tablet by mouth at bedtime.    [provider]  pantoprazole (PROTONIX) 40 MG tablet Take 1 tablet (40 mg total) by mouth daily. 03/07/16   Unk Lightning, PA  traMADol (ULTRAM) 50 MG tablet Take 1  tablet (50 mg total) by mouth every 12 (twelve) hours as needed for moderate pain. 09/16/16   Leatha Gilding, MD    Family History Family History  Problem Relation Age of Onset  . Diabetes Paternal Aunt   . Stroke Father   . Heart disease Father   . Diabetes Father   . Depression Father   . HIV Brother   . Prostate cancer Unknown   . Cancer Sister        unknown type  . Colon cancer Neg Hx     Social History Social History   Tobacco Use  . Smoking status: Never Smoker  . Smokeless tobacco: Never Used  Substance Use Topics  . Alcohol use: No  . Drug use: No     Allergies   Aspirin and Aspirin   Review of Systems Review of Systems  Constitutional: Negative for chills, diaphoresis, fatigue, fever and malaise/fatigue.  HENT: Positive for congestion. Negative for rhinorrhea.   Eyes: Negative for visual disturbance.  Respiratory: Positive for cough. Negative for chest tightness, shortness of breath and wheezing.   Cardiovascular: Positive for syncope. Negative for chest  pain, palpitations and leg swelling.  Gastrointestinal: Negative for abdominal pain, constipation, diarrhea, nausea and vomiting.  Genitourinary: Positive for frequency. Negative for dysuria and flank pain.  Musculoskeletal: Negative for back pain, neck pain and neck stiffness.  Skin: Negative for rash and wound.  Neurological: Positive for syncope, speech difficulty (at baseline), weakness (at baselne) and headaches. Negative for dizziness, focal weakness, seizures, facial asymmetry and light-headedness.  Psychiatric/Behavioral: Negative for agitation.  All other systems reviewed and are negative.    Physical Exam Updated Vital Signs BP (!) 157/65   Pulse 76   Resp (!) 21   SpO2 100%   Physical Exam  Constitutional: She appears well-developed and well-nourished. No distress.  HENT:  Head: Normocephalic. Head is without abrasion, without contusion and without laceration.    Right Ear:  External ear normal.  Left Ear: External ear normal.  Nose: Nose normal.  Mouth/Throat: Oropharynx is clear and moist. No oropharyngeal exudate.  Eyes: Conjunctivae and EOM are normal. Pupils are equal, round, and reactive to light.  Neck: Normal range of motion.  Cardiovascular: Normal rate and intact distal pulses.  Pulmonary/Chest: Effort normal and breath sounds normal. No respiratory distress. She has no wheezes. She has no rales. She exhibits no tenderness.  Abdominal: Bowel sounds are normal. She exhibits no distension. There is no tenderness.  Musculoskeletal: She exhibits tenderness. She exhibits no edema or deformity.       Right hip: She exhibits tenderness.       Left hip: She exhibits tenderness.       Right knee: Tenderness found.       Left knee: Tenderness found.       Right ankle: Tenderness.  Skin: Capillary refill takes less than 2 seconds. No rash noted. She is not diaphoretic. No erythema.  Psychiatric: She has a normal mood and affect.  Nursing note and vitals reviewed.    ED Treatments / Results  Labs (all labs ordered are listed, but only abnormal results are displayed) Labs Reviewed  CBC WITH DIFFERENTIAL/PLATELET - Abnormal; Notable for the following components:      Result Value   Hemoglobin 11.6 (*)    All other components within normal limits  COMPREHENSIVE METABOLIC PANEL - Abnormal; Notable for the following components:   Glucose, Bld 178 (*)    BUN 33 (*)    Creatinine, Ser 1.61 (*)    ALT 13 (*)    GFR calc non Af Amer 29 (*)    GFR calc Af Amer 34 (*)    All other components within normal limits  URINALYSIS, ROUTINE W REFLEX MICROSCOPIC - Abnormal; Notable for the following components:   APPearance HAZY (*)    Leukocytes, UA MODERATE (*)    Bacteria, UA RARE (*)    Squamous Epithelial / LPF 0-5 (*)    All other components within normal limits  I-STAT BETA HCG BLOOD, ED ([facility], [facility], [facility] ONLY) - Abnormal; Notable for the following components:    I-stat hCG, quantitative 7.5 (*)    All other components within normal limits  URINE CULTURE  MAGNESIUM  I-STAT TROPONIN, ED    EKG  EKG Interpretation  Date/Time:  Thursday March 15 2017 17:18:16 EST Ventricular Rate:  74 PR Interval:    QRS Duration: 91 QT Interval:  406 QTC Calculation: 451 R Axis:   40 Text Interpretation:  Sinus rhythm Borderline prolonged PR interval Abnormal R-wave progression, early transition Baseline wander in lead(s) V5 When compared to prior, no signifcant changes  seen.  No STEMI Confirmed by Theda Belfast ([id number]) on 03/15/2017 6:13:28 PM       Radiology Dg Chest 2 View  Result Date: 03/15/2017 CLINICAL DATA:  Fall. EXAM: CHEST  2 VIEW COMPARISON:  09/11/2016 FINDINGS: Lungs are hypoinflated without focal consolidation, effusion or pneumothorax. Cardiomediastinal silhouette and remainder the exam is unchanged. IMPRESSION: No acute findings. Electronically Signed   By: Elberta Fortis M.D.   On: 03/15/2017 18:24   Dg Ankle Complete Right  Result Date: 03/15/2017 CLINICAL DATA:  Fall with right ankle pain. EXAM: RIGHT ANKLE - COMPLETE 3+ VIEW COMPARISON:  None. FINDINGS: Mild osteopenia. Ankle mortise is within normal. No acute fracture or dislocation. IMPRESSION: No acute findings. Electronically Signed   By: Elberta Fortis M.D.   On: 03/15/2017 18:25   Ct Head Wo Contrast  Result Date: 03/15/2017 CLINICAL DATA:  Found on floor face down. History of dementia. Injury to head. Patient on Plavix. EXAM: CT HEAD WITHOUT CONTRAST CT CERVICAL SPINE WITHOUT CONTRAST TECHNIQUE: Multidetector CT imaging of the head and cervical spine was performed following the standard protocol without intravenous contrast. Multiplanar CT image reconstructions of the cervical spine were also generated. COMPARISON:  Head CT 08/30/2016 FINDINGS: CT HEAD FINDINGS Brain: Ventricles and cisterns are within normal. A cavum septum variant is present. CSF spaces are within normal. There is  evidence of multiple bilateral old supratentorial infarcts and chronic ischemic microvascular disease. No evidence of mass, mass effect, shift of midline structures or acute hemorrhage. No evidence of acute infarction. Vascular: No hyperdense vessel or unexpected calcification. Skull: Normal. Negative for fracture or focal lesion. Sinuses/Orbits: No acute finding. Other: None. CT CERVICAL SPINE FINDINGS Alignment: Subtle 2 mm posterior subluxation of C4 with respect to C5 likely degenerative in nature. Skull base and vertebrae: Mild spondylosis throughout the cervical spine. Vertebral body heights are maintained. Atlantoaxial articulation is unremarkable. There is mild uncovertebral joint spurring and facet arthropathy. No evidence of acute fracture. Soft tissues and spinal canal: No prevertebral fluid or swelling. No visible canal hematoma. Disc levels:  Moderate disc space narrowing at the C4-5 level. Upper chest: Negative. Other: None. IMPRESSION: No acute brain injury. Multiple old bilateral supratentorial infarcts and evidence of chronic ischemic microvascular disease. No acute cervical spine injury. Mild spondylosis of the cervical spine with disc disease at the C4-5 level. Minimal degenerative subluxation of C4 on C5. Electronically Signed   By: Elberta Fortis M.D.   On: 03/15/2017 18:17   Ct Cervical Spine Wo Contrast  Result Date: 03/15/2017 CLINICAL DATA:  Found on floor face down. History of dementia. Injury to head. Patient on Plavix. EXAM: CT HEAD WITHOUT CONTRAST CT CERVICAL SPINE WITHOUT CONTRAST TECHNIQUE: Multidetector CT imaging of the head and cervical spine was performed following the standard protocol without intravenous contrast. Multiplanar CT image reconstructions of the cervical spine were also generated. COMPARISON:  Head CT 08/30/2016 FINDINGS: CT HEAD FINDINGS Brain: Ventricles and cisterns are within normal. A cavum septum variant is present. CSF spaces are within normal. There is  evidence of multiple bilateral old supratentorial infarcts and chronic ischemic microvascular disease. No evidence of mass, mass effect, shift of midline structures or acute hemorrhage. No evidence of acute infarction. Vascular: No hyperdense vessel or unexpected calcification. Skull: Normal. Negative for fracture or focal lesion. Sinuses/Orbits: No acute finding. Other: None. CT CERVICAL SPINE FINDINGS Alignment: Subtle 2 mm posterior subluxation of C4 with respect to C5 likely degenerative in nature. Skull base and vertebrae: Mild spondylosis throughout the cervical  spine. Vertebral body heights are maintained. Atlantoaxial articulation is unremarkable. There is mild uncovertebral joint spurring and facet arthropathy. No evidence of acute fracture. Soft tissues and spinal canal: No prevertebral fluid or swelling. No visible canal hematoma. Disc levels:  Moderate disc space narrowing at the C4-5 level. Upper chest: Negative. Other: None. IMPRESSION: No acute brain injury. Multiple old bilateral supratentorial infarcts and evidence of chronic ischemic microvascular disease. No acute cervical spine injury. Mild spondylosis of the cervical spine with disc disease at the C4-5 level. Minimal degenerative subluxation of C4 on C5. Electronically Signed   By: Elberta Fortis M.D.   On: 03/15/2017 18:17   Dg Knee Complete 4 Views Left  Result Date: 03/15/2017 CLINICAL DATA:  Fall with left knee pain. EXAM: LEFT KNEE - COMPLETE 4+ VIEW COMPARISON:  09/06/2014 FINDINGS: Mild diffuse osteopenia. Mild tricompartmental osteoarthritic changes present. There is no evidence of acute fracture, dislocation or significant joint effusion. There is calcified atherosclerotic plaque over the femoropopliteal as well as lower leg arteries. IMPRESSION: No acute findings. Electronically Signed   By: Elberta Fortis M.D.   On: 03/15/2017 18:22   Dg Knee Complete 4 Views Right  Result Date: 03/15/2017 CLINICAL DATA:  Right knee pain post  fall. EXAM: RIGHT KNEE - COMPLETE 4+ VIEW COMPARISON:  06/18/2016 FINDINGS: Mild diffuse osteopenia. No evidence of acute fracture or dislocation. No significant joint effusion. Atherosclerotic plaque over the arteries. IMPRESSION: No acute findings. Electronically Signed   By: Elberta Fortis M.D.   On: 03/15/2017 18:22   Dg Hip Unilat With Pelvis 2-3 Views Left  Result Date: 03/15/2017 CLINICAL DATA:  Fall with left hip pain. EXAM: DG HIP (WITH OR WITHOUT PELVIS) 2-3V LEFT COMPARISON:  CT abdomen/pelvis 09/11/2016 FINDINGS: Minimal degenerate change over the left hip. No acute fracture or dislocation. Degenerate change of the spine. IMPRESSION: No acute findings. Electronically Signed   By: Elberta Fortis M.D.   On: 03/15/2017 18:20   Dg Hip Unilat  With Pelvis 2-3 Views Right  Result Date: 03/15/2017 CLINICAL DATA:  Fall with right hip pain. EXAM: DG HIP (WITH OR WITHOUT PELVIS) 2-3V RIGHT COMPARISON:  None. FINDINGS: No evidence of fracture or dislocation. Degenerative change of the spine. IMPRESSION: No acute findings. Electronically Signed   By: Elberta Fortis M.D.   On: 03/15/2017 18:21    Procedures Procedures (including critical care time)  Medications Ordered in ED Medications  fosfomycin (MONUROL) packet 3 g (not administered)  acetaminophen (TYLENOL) tablet 650 mg (650 mg Oral Given 03/15/17 2228)     Initial Impression / Assessment and Plan / ED Course  I have reviewed the triage vital signs and the nursing notes.  Pertinent labs & imaging results that were available during my care of the patient were reviewed by me and considered in my medical decision making (see chart for details).     LILYROSE TANNEY is a 82 y.o. female with a past medical history significant for dementia, prior strokes on Plavix therapy, CHF, CKD, hypertension, hyperlipidemia, and asthma who presents with a fall.  Patient reports that she actually had a syncopal episode while walking upstairs at home this  afternoon.  Patient says that she felt everything go dark and she passed out falling to the ground.  She reports waking up on the ground with pain in her head, bilateral hips, bilateral knees, and right ankle.  She denied any associated chest pain, palpitations or shortness of breath.  She reports that she has had a  cough for the last few days that has been nonproductive.  She also reports intermittent diarrhea.  She denies any urinary symptoms or constipation.  She reports that she is having a right frontal headache that is approximately 6 out of 10 in severity.  She reports the pain in her bilateral hips knees and right ankle have been constant as well.  She denies any nausea, vomiting, or new vision changes.  She says that she has been eating and drinking normally.  She denies any ongoing symptoms aside from the pain in the injured locations.  Patient denies any abdominal pain, back pain at this time.  On exam, patient had tenderness in the right ankle, bilateral knees, and bilateral hips.  Patient also tenderness on her forehead.  Patient had normal extraocular movements and had symmetric pupils bilaterally.  Lungs were clear and chest was nontender.  Back otherwise nontender.  Abdomen nontender.  Patient had minimal edema in her legs however she had compression stockings in place.  Pulses were present in all extremities.  Based on patient's fall on Plavix with headache and extremity pains, patient with imaging to look for fractures or intracranial normality.  Patient also have workup including labs EKG chest x-ray and urinalysis to look for etiology of her syncopal episode including occult infection or electrode abnormality.  Anticipate reassessment after imaging and workup.  Diagnostic imaging showed no evidence of acute traumatic injuries or fractures.  Laboratory testing showed leukocytes and bacteria in the urine.  Patient reports that she does have urinary frequency on reassessment.  Patient will  be treated for UTI as a suspect contributed to her lightheadedness and syncopal episode.  Troponin negative.  Electrolytes were reassuring.  Magnesium normal.  Patient has had no lightheadedness or episodes of syncope or arrhythmias while in the emergency department.  Do not feel patient has a cardiac cause of symptoms.  I suspect she was dehydrated in the setting of have a fall.  Patient will be allowed to eat and drink.    Patient given dose of fosfomycin to treat the UTI.  Patient will follow up with PCP in several days.  Patient was understanding of the plan of care and will be discharged in good condition.  Final Clinical Impressions(s) / ED Diagnoses   Final diagnoses:  Fall, initial encounter  Frequency of urination  Syncope, unspecified syncope type  Dehydration    ED Discharge Orders    None      Clinical Impression: 1. Fall, initial encounter   2. Frequency of urination   3. Syncope, unspecified syncope type   4. Dehydration     Disposition: Discharge  Condition: Good  I have discussed the results, Dx and Tx plan with the pt(& family if present). He/she/they expressed understanding and agree(s) with the plan. Discharge instructions discussed at great length. Strict return precautions discussed and pt &/or family have verbalized understanding of the instructions. No further questions at time of discharge.    New Prescriptions   No medications on file    Follow Up: No follow-up provider specified.    Rowdy Guerrini, Canary Brim, MD 03/16/17 [phone]

## 2017-03-15 NOTE — ED Notes (Signed)
Bed: WA03 Expected date:  Expected time:  Means of arrival:  Comments: EMS_fall 

## 2017-03-15 NOTE — ED Triage Notes (Addendum)
Patient BIB EMS from home, where she lives with her daughter. Patient's daughter states she heard her mom yelling, and she walked in and found her mom on the floor, face down. Patient has history of dementia and x2 CVA's, with baseline of slurred speech. Per patient, she was walking and fell on her knees, then hit her head. Patient is on Plavix. Patient complaining of bilateral knee pain and bilateral hip pain. Patient denies head pain/headache.

## 2017-03-15 NOTE — Discharge Instructions (Signed)
Your workup today showed no evidence of acute fractures or dislocations or injuries from your fall.  We suspect you passed out in the setting of a urinary tract infection and dehydration we discovered.  We treated for UTI with antibiotics.  Please stay hydrated and follow-up with your primary doctor in several days.  Your other workup was all reassuring.  We did not find anything that you needed to be admitted to the hospital for.  If any symptoms change or worsen, please return to the nearest ED.

## 2017-03-17 LAB — URINE CULTURE

## 2017-05-06 ENCOUNTER — Inpatient Hospital Stay (HOSPITAL_COMMUNITY)
Admission: EM | Admit: 2017-05-06 | Discharge: 2017-05-08 | DRG: 065 | Disposition: A | Discharge status: Skilled Nursing Facility | Payer: Medicare (Managed Care) | Attending: Internal Medicine | Admitting: Internal Medicine

## 2017-05-06 ENCOUNTER — Observation Stay (HOSPITAL_COMMUNITY): Payer: Medicare (Managed Care)

## 2017-05-06 ENCOUNTER — Emergency Department (HOSPITAL_COMMUNITY): Payer: Medicare (Managed Care)

## 2017-05-06 ENCOUNTER — Encounter (HOSPITAL_COMMUNITY): Payer: Self-pay | Admitting: Emergency Medicine

## 2017-05-06 DIAGNOSIS — Z8673 Personal history of transient ischemic attack (TIA), and cerebral infarction without residual deficits: Secondary | ICD-10-CM

## 2017-05-06 DIAGNOSIS — G459 Transient cerebral ischemic attack, unspecified: Secondary | ICD-10-CM | POA: Diagnosis not present

## 2017-05-06 DIAGNOSIS — I639 Cerebral infarction, unspecified: Secondary | ICD-10-CM

## 2017-05-06 DIAGNOSIS — I130 Hypertensive heart and chronic kidney disease with heart failure and stage 1 through stage 4 chronic kidney disease, or unspecified chronic kidney disease: Secondary | ICD-10-CM | POA: Diagnosis present

## 2017-05-06 DIAGNOSIS — F0390 Unspecified dementia without behavioral disturbance: Secondary | ICD-10-CM | POA: Diagnosis present

## 2017-05-06 DIAGNOSIS — I5032 Chronic diastolic (congestive) heart failure: Secondary | ICD-10-CM | POA: Diagnosis present

## 2017-05-06 DIAGNOSIS — N183 Chronic kidney disease, stage 3 unspecified: Secondary | ICD-10-CM | POA: Diagnosis present

## 2017-05-06 DIAGNOSIS — J45909 Unspecified asthma, uncomplicated: Secondary | ICD-10-CM | POA: Diagnosis present

## 2017-05-06 DIAGNOSIS — I10 Essential (primary) hypertension: Secondary | ICD-10-CM | POA: Diagnosis not present

## 2017-05-06 DIAGNOSIS — E1122 Type 2 diabetes mellitus with diabetic chronic kidney disease: Secondary | ICD-10-CM | POA: Diagnosis present

## 2017-05-06 DIAGNOSIS — Z886 Allergy status to analgesic agent status: Secondary | ICD-10-CM

## 2017-05-06 DIAGNOSIS — Z7902 Long term (current) use of antithrombotics/antiplatelets: Secondary | ICD-10-CM

## 2017-05-06 DIAGNOSIS — I63411 Cerebral infarction due to embolism of right middle cerebral artery: Secondary | ICD-10-CM | POA: Diagnosis not present

## 2017-05-06 DIAGNOSIS — Z79899 Other long term (current) drug therapy: Secondary | ICD-10-CM

## 2017-05-06 DIAGNOSIS — R29810 Facial weakness: Secondary | ICD-10-CM | POA: Diagnosis present

## 2017-05-06 DIAGNOSIS — G8194 Hemiplegia, unspecified affecting left nondominant side: Secondary | ICD-10-CM | POA: Diagnosis present

## 2017-05-06 DIAGNOSIS — R29711 NIHSS score 11: Secondary | ICD-10-CM | POA: Diagnosis present

## 2017-05-06 DIAGNOSIS — Z90710 Acquired absence of both cervix and uterus: Secondary | ICD-10-CM

## 2017-05-06 DIAGNOSIS — E785 Hyperlipidemia, unspecified: Secondary | ICD-10-CM | POA: Diagnosis present

## 2017-05-06 LAB — COMPREHENSIVE METABOLIC PANEL
ALBUMIN: 3.3 g/dL — AB (ref 3.5–5.0)
ALK PHOS: 50 U/L (ref 38–126)
ALT: 14 U/L (ref 14–54)
ANION GAP: 8 (ref 5–15)
AST: 24 U/L (ref 15–41)
BILIRUBIN TOTAL: 0.5 mg/dL (ref 0.3–1.2)
BUN: 24 mg/dL — AB (ref 6–20)
CO2: 23 mmol/L (ref 22–32)
Calcium: 9.1 mg/dL (ref 8.9–10.3)
Chloride: 105 mmol/L (ref 101–111)
Creatinine, Ser: 1.8 mg/dL — ABNORMAL HIGH (ref 0.44–1.00)
GFR calc Af Amer: 30 mL/min — ABNORMAL LOW (ref >60)
GFR, EST NON AFRICAN AMERICAN: 26 mL/min — AB (ref >60)
GLUCOSE: 179 mg/dL — AB (ref 65–99)
POTASSIUM: 4.8 mmol/L (ref 3.5–5.1)
Sodium: 136 mmol/L (ref 135–145)
TOTAL PROTEIN: 6.6 g/dL (ref 6.5–8.1)

## 2017-05-06 LAB — I-STAT CHEM 8, ED
BUN: 25 mg/dL — AB (ref 6–20)
CREATININE: 1.7 mg/dL — AB (ref 0.44–1.00)
Calcium, Ion: 1.14 mmol/L — ABNORMAL LOW (ref 1.15–1.40)
Chloride: 105 mmol/L (ref 101–111)
Glucose, Bld: 180 mg/dL — ABNORMAL HIGH (ref 65–99)
HEMATOCRIT: 36 % (ref 36.0–46.0)
HEMOGLOBIN: 12.2 g/dL (ref 12.0–15.0)
Potassium: 4.9 mmol/L (ref 3.5–5.1)
SODIUM: 137 mmol/L (ref 135–145)
TCO2: 23 mmol/L (ref 22–32)

## 2017-05-06 LAB — CBC
HEMATOCRIT: 36.1 % (ref 36.0–46.0)
HEMOGLOBIN: 11.4 g/dL — AB (ref 12.0–15.0)
MCH: 27.7 pg (ref 26.0–34.0)
MCHC: 31.6 g/dL (ref 30.0–36.0)
MCV: 87.6 fL (ref 78.0–100.0)
Platelets: 199 10*3/uL (ref 150–400)
RBC: 4.12 MIL/uL (ref 3.87–5.11)
RDW: 14.2 % (ref 11.5–15.5)
WBC: 4.8 10*3/uL (ref 4.0–10.5)

## 2017-05-06 LAB — DIFFERENTIAL
Basophils Absolute: 0 10*3/uL (ref 0.0–0.1)
Basophils Relative: 0 %
EOS ABS: 0 10*3/uL (ref 0.0–0.7)
EOS PCT: 0 %
LYMPHS ABS: 0.8 10*3/uL (ref 0.7–4.0)
LYMPHS PCT: 16 %
MONO ABS: 0.3 10*3/uL (ref 0.1–1.0)
MONOS PCT: 6 %
NEUTROS PCT: 78 %
Neutro Abs: 3.7 10*3/uL (ref 1.7–7.7)

## 2017-05-06 LAB — PROTIME-INR
INR: 1.05
Prothrombin Time: 13.6 seconds (ref 11.4–15.2)

## 2017-05-06 LAB — APTT: aPTT: 28 seconds (ref 24–36)

## 2017-05-06 LAB — I-STAT TROPONIN, ED: TROPONIN I, POC: 0 ng/mL (ref 0.00–0.08)

## 2017-05-06 MED ORDER — SENNOSIDES-DOCUSATE SODIUM 8.6-50 MG PO TABS: 1.0000 | ORAL_TABLET | Freq: Every evening | ORAL | Status: DC | PRN

## 2017-05-06 MED ORDER — STROKE: EARLY STAGES OF RECOVERY BOOK: Freq: Once | Status: DC

## 2017-05-06 MED ORDER — SODIUM CHLORIDE 0.9 % IV SOLN
INTRAVENOUS | Status: DC
  Administered 2017-05-06: via INTRAVENOUS

## 2017-05-06 MED ORDER — ACETAMINOPHEN 650 MG RE SUPP: 650.0000 mg | RECTAL | Status: DC | PRN

## 2017-05-06 MED ORDER — ACETAMINOPHEN 160 MG/5ML PO SOLN: 650.0000 mg | ORAL | Status: DC | PRN

## 2017-05-06 MED ORDER — ENOXAPARIN SODIUM 30 MG/0.3ML ~~LOC~~ SOLN
30.0000 mg | Freq: Every day | SUBCUTANEOUS | Status: DC
  Administered 2017-05-07 – 2017-05-08 (×2): 30 mg via SUBCUTANEOUS
  Filled 2017-05-06 (×2): qty 0.3

## 2017-05-06 MED ORDER — LORAZEPAM 2 MG/ML IJ SOLN
0.5000 mg | Freq: Once | INTRAMUSCULAR | Status: AC
  Administered 2017-05-06: 0.5 mg via INTRAVENOUS
  Filled 2017-05-06: qty 1

## 2017-05-06 MED ORDER — CLOPIDOGREL BISULFATE 75 MG PO TABS
75.0000 mg | ORAL_TABLET | Freq: Every day | ORAL | Status: DC
  Administered 2017-05-07 – 2017-05-08 (×2): 75 mg via ORAL
  Filled 2017-05-06 (×2): qty 1

## 2017-05-06 MED ORDER — ACETAMINOPHEN 325 MG PO TABS: 650.0000 mg | ORAL_TABLET | ORAL | Status: DC | PRN

## 2017-05-06 NOTE — ED Triage Notes (Signed)
Per EMS: Pt has Hx of strokes with R sided deficits.  Pt LKW at 1430, symptoms noticed at 1550. Pt presents today with mild aphasia, dysarthria, L sided weakness, trouble with gait,difficulty swallowing and R sided facial droop.  Daughter was one the one that found Pt.

## 2017-05-06 NOTE — Consult Note (Signed)
Referring Physician: Dr. Darrick Meigs    Chief Complaint: Acute onset of right facial droop and left upper extremity weakness  HPI: [name] is an 82 y.o. female presenting with acute onset of right facial droop and left upper extremity weakness. She has a history of prior stroke. Multiple old infarctions are seen on CT. The patient is a poor historian.   LSN: 2:30 PM tPA Given: No: On examination of new deficits endorsed by the patient, consisting of left facial weakness and left hand/wrist weakness, the findings are relatively minor and risks of hemorrhage are felt to significantly outweigh the potential benefits of tPA  Past Medical History:  Diagnosis Date  . Arthritis    hands  . Asthma   . Chronic diastolic CHF (congestive heart failure) ([place])   . Chronic kidney disease (CKD), stage III (moderate) ([facility])   . Dementia   . Depression   . Diabetes mellitus without complication ([place])   . Diverticulosis   . Hx of adenomatous colonic polyps   . Hyperlipidemia   . Hypertension   . Hypochromic anemia   . Internal hemorrhoids   . Stroke [facility]) May 2009   multiple, last one Nov 2012, 2 strokes with right sided deficits and slurred speech  . Stroke [facility])     Past Surgical History:  Procedure Laterality Date  . ABDOMINAL HYSTERECTOMY    . ABDOMINAL SURGERY    . BOWEL RESECTION    . CHOLECYSTECTOMY N/A 09/15/2016   Procedure: LAPAROSCOPIC CHOLECYSTECTOMY WITH INTRAOPERATIVE CHOLANGIOGRAM;  Surgeon: Alphonsa Overall, MD;  Location: [facility] ORS;  Service: General;  Laterality: N/A;  . COLECTOMY    . ESOPHAGOGASTRODUODENOSCOPY N/A 02/01/2016   Procedure: ESOPHAGOGASTRODUODENOSCOPY (EGD);  Surgeon: Jerene Bears, MD;  Location: [facility] ENDOSCOPY;  Service: Endoscopy;  Laterality: N/A;  . FLEXIBLE SIGMOIDOSCOPY N/A 05/28/2012   Procedure: FLEXIBLE SIGMOIDOSCOPY;  Surgeon: Lafayette Dragon, MD;  Location: [facility] ENDOSCOPY;  Service: Endoscopy;  Laterality: N/A;  . OTHER SURGICAL HISTORY     2 ear surgeries  .  PERIPHERAL VASCULAR CATHETERIZATION N/A 07/21/2014   Procedure: Abdominal Aortogram w/Lower Extremity;  Surgeon: Serafina Mitchell, MD;  Location: [place] CV LAB;  Service: Cardiovascular;  Laterality: N/A;  . PERIPHERAL VASCULAR CATHETERIZATION N/A 11/23/2015   Procedure: Abdominal Aortogram w/Lower Extremity;  Surgeon: Serafina Mitchell, MD;  Location: [place] CV LAB;  Service: Cardiovascular;  Laterality: N/A;  . PERIPHERAL VASCULAR CATHETERIZATION  11/23/2015   Procedure: Peripheral Vascular Intervention;  Surgeon: Serafina Mitchell, MD;  Location: [place] CV LAB;  Service: Cardiovascular;;  Failed PVI of AT and Peroneal on left side  . SUBTOTAL COLECTOMY  2010   for stricturing from bouts of diverticulitis along with hx recurrent diverticular bleeds.  the surgery was a total abdominal colec  . TONSILLECTOMY    . VASCULAR SURGERY      Family History  Problem Relation Age of Onset  . Diabetes Paternal Aunt   . Stroke Father   . Heart disease Father   . Diabetes Father   . Depression Father   . HIV Brother   . Prostate cancer Unknown   . Cancer Sister        unknown type  . Colon cancer Neg Hx    Social History:  reports that she has never smoked. She has never used smokeless tobacco. She reports that she does not drink alcohol or use drugs.  Allergies:  Allergies  Allergen Reactions  . Aspirin Anaphylaxis  . Aspirin Nausea  And Vomiting    Hurts stomach    Medications:  Prior to Admission:  Medications Prior to Admission  Medication Sig Dispense Refill Last Dose  . Cholecalciferol (VITAMIN D3) 1000 UNITS CAPS Take 1,000 Units by mouth daily.    09/11/2016 at Unknown time  . clopidogrel (PLAVIX) 75 MG tablet Take 1 tablet (75 mg total) by mouth daily. Hold until next week, restart on Monday 8/27 30 tablet 0   . loratadine (CLARITIN) 10 MG tablet Take 10 mg by mouth daily.    09/11/2016 at Unknown time  . losartan (COZAAR) 25 MG tablet Take 1 tablet (25 mg total) by mouth  daily. 30 tablet 0 09/11/2016 at Unknown time  . Melatonin 300 MCG TABS Take 1 tablet by mouth at bedtime.   09/10/2016 at Unknown time  . pantoprazole (PROTONIX) 40 MG tablet Take 1 tablet (40 mg total) by mouth daily. 30 tablet 11 09/11/2016 at Unknown time  . traMADol (ULTRAM) 50 MG tablet Take 1 tablet (50 mg total) by mouth every 12 (twelve) hours as needed for moderate pain. 20 tablet 0     ROS: As per HPI.   Physical Examination: Blood pressure (!) 170/78, pulse 68, temperature 97.9 F (36.6 C), temperature source Axillary, resp. rate 16, height _0  (1.676 m), weight 54.3 kg (119 lb 11.4 oz), SpO2 100 %.  HEENT: [place]/AT Lungs: Respirations unlabored Ext: Mild pretibial edema  Neurologic Examination: Ment: Awake and alert. Speech dysarthric. Halting speech pattern. Naming intact. Repetition impaired. Not oriented to day, month or year. Oriented to self and location.  CN: Left gaze preference with hesitant pursuits to left. PERRL. No blink to threat on the left. Reacts to stimulation CNV distribution bilaterally. Left facial droop. Hearing intact to voice. Phonation intact. Tongue midline.  Motor: Able to lift both arms antigravity and resist examiner at deltoids, biceps and triceps with 4/5 strength. Left grip 2/5, left finger extension 2/5, left wrist extension 2/5.  Bilateral lower extremities 4-/5 proximal and distal Sensory: Intact to temp bilateral upper and lower ext without asymmetry. FT intact x 4. Positive for extinction when assessing lower ext. Reflexes: 3+ throughout except for absent left patellar and 1+ achilles bilaterally.  Toes upgoing bilaterally Cerebellar: Slow FNF without clear ataxia Gait: Unable to assess  Results for orders placed or performed during the hospital encounter of 05/06/17 (from the past 48 hour(s))  I-stat troponin, ED     Status: None   Collection Time: 05/06/17  4:59 PM  Result Value Ref Range   Troponin i, poc 0.00 0.00 - 0.08 ng/mL   Comment  3            Comment: Due to the release kinetics of cTnI, a negative result within the first hours of the onset of symptoms does not rule out myocardial infarction with certainty. If myocardial infarction is still suspected, repeat the test at appropriate intervals.   I-Stat Chem 8, ED     Status: Abnormal   Collection Time: 05/06/17  5:01 PM  Result Value Ref Range   Sodium 137 135 - 145 mmol/L   Potassium 4.9 3.5 - 5.1 mmol/L   Chloride 105 101 - 111 mmol/L   BUN 25 (H) 6 - 20 mg/dL   Creatinine, Ser 1.70 (H) 0.44 - 1.00 mg/dL   Glucose, Bld 180 (H) 65 - 99 mg/dL   Calcium, Ion 1.14 (L) 1.15 - 1.40 mmol/L   TCO2 23 22 - 32 mmol/L   Hemoglobin 12.2 12.0 -  15.0 g/dL   HCT 36.0 36.0 - 46.0 %  Protime-INR     Status: None   Collection Time: 05/06/17  5:30 PM  Result Value Ref Range   Prothrombin Time 13.6 11.4 - 15.2 seconds   INR 1.05     Comment: Performed at [facility] [address], [place], [place] [address]  APTT     Status: None   Collection Time: 05/06/17  5:30 PM  Result Value Ref Range   aPTT 28 24 - 36 seconds    Comment: Performed at [facility] [address] [place], [place] [address]  CBC     Status: Abnormal   Collection Time: 05/06/17  5:30 PM  Result Value Ref Range   WBC 4.8 4.0 - 10.5 K/uL   RBC 4.12 3.87 - 5.11 MIL/uL   Hemoglobin 11.4 (L) 12.0 - 15.0 g/dL   HCT 36.1 36.0 - 46.0 %   MCV 87.6 78.0 - 100.0 fL   MCH 27.7 26.0 - 34.0 pg   MCHC 31.6 30.0 - 36.0 g/dL   RDW 14.2 11.5 - 15.5 %   Platelets 199 150 - 400 K/uL    Comment: Performed at [facility], [place] [address], [place], [place] [address]  Differential     Status: None   Collection Time: 05/06/17  5:30 PM  Result Value Ref Range   Neutrophils Relative % 78 %   Neutro Abs 3.7 1.7 - 7.7 K/uL   Lymphocytes Relative 16 %   Lymphs Abs 0.8 0.7 - 4.0 K/uL   Monocytes Relative 6 %   Monocytes Absolute 0.3 0.1 - 1.0 K/uL   Eosinophils Relative 0 %   Eosinophils  Absolute 0.0 0.0 - 0.7 K/uL   Basophils Relative 0 %   Basophils Absolute 0.0 0.0 - 0.1 K/uL    Comment: Performed at [facility] [address], [place], [place] [address]  Comprehensive metabolic panel     Status: Abnormal   Collection Time: 05/06/17  5:30 PM  Result Value Ref Range   Sodium 136 135 - 145 mmol/L   Potassium 4.8 3.5 - 5.1 mmol/L   Chloride 105 101 - 111 mmol/L   CO2 23 22 - 32 mmol/L   Glucose, Bld 179 (H) 65 - 99 mg/dL   BUN 24 (H) 6 - 20 mg/dL   Creatinine, Ser 1.80 (H) 0.44 - 1.00 mg/dL   Calcium 9.1 8.9 - 10.3 mg/dL   Total Protein 6.6 6.5 - 8.1 g/dL   Albumin 3.3 (L) 3.5 - 5.0 g/dL   AST 24 15 - 41 U/L   ALT 14 14 - 54 U/L   Alkaline Phosphatase 50 38 - 126 U/L   Total Bilirubin 0.5 0.3 - 1.2 mg/dL   GFR calc non Af Amer 26 (L) >60 mL/min   GFR calc Af Amer 30 (L) >60 mL/min    Comment: (NOTE) The eGFR has been calculated using the CKD EPI equation. This calculation has not been validated in all clinical situations. eGFR's persistently <60 mL/min signify possible Chronic Kidney Disease.    Anion gap 8 5 - 15    Comment: Performed at [facility] [address], [place], [place] [address]   Ct Head Code Stroke Wo Contrast  Result Date: 05/06/2017 CLINICAL DATA:  Code stroke. Code stroke. Left-sided weakness and left facial droop. EXAM: CT HEAD WITHOUT CONTRAST TECHNIQUE: Contiguous axial images were obtained from the base of the  skull through the vertex without intravenous contrast. COMPARISON:  None. FINDINGS: Brain: Multiple well-defined low-density areas involving cortex and white matter of the bilateral frontal, left parietal, and right occipital lobes consistent with multifocal remote infarct. No definite acute infarct. No hemorrhage, hydrocephalus, or masslike finding. Vascular: Atherosclerotic calcification.  No hyperdense vessel. Skull: No acute finding Sinuses/Orbits: No acute finding. Other: These results were communicated to Dr.  Cheral Marker at [place] 4/14/2019by text page via the [facility] messaging system. ASPECTS ([place] Stroke Program Early CT Score) Not scored in the setting of remote MCA territory infarcts. IMPRESSION: 1. No hemorrhage or definite acute infarct. 2. Multifocal remote appearing infarcts in the bilateral MCA, left ACA, and right PCA distributions. Electronically Signed   By: Monte Fantasia M.D.   On: 05/06/2017 17:05    Assessment: 82 y.o. female presenting with acute onset of left facial droop and left hand weakness.  1. On examination of new deficits endorsed by the patient, consisting of left facial weakness and left hand/wrist weakness, the findings are relatively minor and risks of hemorrhage are felt to significantly outweigh the potential benefits of tPA. 2. Exam not consistent with LVO. Not a thrombectomy candidate.  3. CT head shows multiple old strokes. No acute hemorrhage seen.  4. Stroke Risk Factors - Multiple stroke risk factors as documented in PMHx  Plan: 1. HgbA1c, fasting lipid panel 2. MRI, MRA of the brain without contrast 3. PT consult, OT consult, Speech consult 4. Echocardiogram 5. Carotid dopplers 6. Continue Plavix. Has ASA allergy.  7. Risk factor modification 8. Telemetry monitoring 9. Frequent neuro checks  _0  signed: Dr. Kerney Elbe 05/06/2017, 10:07 PM

## 2017-05-06 NOTE — Progress Notes (Signed)
Pt off floor to xray and MRI.

## 2017-05-06 NOTE — H&P (Addendum)
TRH H&P    Patient Demographics:    [name], is a 82 y.o. female  MRN: [id number]  DOB - 1935-02-24  Admit Date - 05/06/2017  Referring MD/NP/PA: Dr. Effie ShyWentz  Outpatient Primary MD for the patient is Jethro BastosKoehler, Robert N, MD  Patient coming from: Home  Chief complaint-left-sided weakness   HPI:    Emma BiSarah Rao  is a 82 y.o. female, with history of stroke with right-sided deficits was brought to hospital with complaints of left-sided weakness, dysarthria, aphasia.  Patient is unable to provide history due to underlying dysarthria, no family at bedside.  History obtained from the ED notes. Patient was evaluated by neurologist in the ED, no TPA was given per neur hospitalist. CT head was negative for stroke MRI brain ordered.   Review of systems:    Unable to obtain due to patient's underlying dysarthria/aphasia    With Past History of the following :    Past Medical History:  Diagnosis Date  . Arthritis    hands  . Asthma   . Chronic diastolic CHF (congestive heart failure) ([facility])   . Chronic kidney disease (CKD), stage III (moderate) ([facility])   . Dementia   . Depression   . Diabetes mellitus without complication ([facility])   . Diverticulosis   . Hx of adenomatous colonic polyps   . Hyperlipidemia   . Hypertension   . Hypochromic anemia   . Internal hemorrhoids   . Stroke [facility](HCC) May 2009   multiple, last one Nov 2012, 2 strokes with right sided deficits and slurred speech  . Stroke [facility](HCC)       Past Surgical History:  Procedure Laterality Date  . ABDOMINAL HYSTERECTOMY    . ABDOMINAL SURGERY    . BOWEL RESECTION    . CHOLECYSTECTOMY N/A 09/15/2016   Procedure: LAPAROSCOPIC CHOLECYSTECTOMY WITH INTRAOPERATIVE CHOLANGIOGRAM;  Surgeon: [name], [name], MD;  Location: [facility] OR[facility];  Service: General;  Laterality: N/A;  . COLECTOMY    . ESOPHAGOGASTRODUODENOSCOPY N/A 02/01/2016   Procedure:  ESOPHAGOGASTRODUODENOSCOPY (EGD);  Surgeon: [name], MD;  [facility];  Service: Endoscopy;  Laterality: N/A;  . FLEXIBLE SIGMOIDOSCOPY N/A 05/28/2012   Procedure: FLEXIBLE SIGMOIDOSCOPY;  Surgeon: [name], MD;  [facility];  Service: Endoscopy;  Laterality: N/A;  . OTHER SURGICAL HISTORY     2 ear surgeries  . PERIPHERAL VASCULAR CATHETERIZATION N/A 07/21/2014   Procedure: Abdominal Aortogram w/Lower Extremity;  Surgeon: [name], MD;  [facility] INVASIVE CV LAB;  Service: Cardiovascular;  Laterality: N/A;  . PERIPHERAL VASCULAR CATHETERIZATION N/A 11/23/2015   Procedure: Abdominal Aortogram w/Lower Extremity;  Surgeon: [name], MD;  Location: [facility] INVASIVE CV LAB;  Service: Cardiovascular;  Laterality: N/A;  . PERIPHERAL VASCULAR CATHETERIZATION  11/23/2015   Procedure: Peripheral Vascular Intervention;  Surgeon: [name], MD;  Location: [facility] INVASIVE CV LAB;  Service: Cardiovascular;;  Failed PVI of AT and Peroneal on left side  . SUBTOTAL COLECTOMY  2010   for stricturing from bouts of diverticulitis along with hx recurrent diverticular bleeds.  the surgery was a total abdominal colec  . TONSILLECTOMY    . VASCULAR SURGERY        Social History:      Social History   Tobacco Use  . Smoking status: Never Smoker  . Smokeless tobacco: Never Used  Substance Use Topics  . Alcohol use: No       Family History :     Family History  Problem Relation Age of Onset  . Diabetes Paternal Aunt   . Stroke Father   . Heart disease Father   . Diabetes Father   . Depression Father   . HIV Brother   . Prostate cancer Unknown   . Cancer Sister        unknown type  . Colon cancer Neg Hx       Home Medications:   Prior to Admission medications   Medication Sig Start Date End Date Taking? Authorizing Provider  Cholecalciferol (VITAMIN D3) 1000 UNITS CAPS Take 1,000 Units by mouth daily.     [provider]  clopidogrel  (PLAVIX) 75 MG tablet Take 1 tablet (75 mg total) by mouth daily. Hold until next week, restart on Monday 8/27 09/16/16   Leatha Gilding, MD  loratadine (CLARITIN) 10 MG tablet Take 10 mg by mouth daily.     [provider]  losartan (COZAAR) 25 MG tablet Take 1 tablet (25 mg total) by mouth daily. 02/03/16   Albertine Grates, MD  Melatonin 300 MCG TABS Take 1 tablet by mouth at bedtime.    [provider]  pantoprazole (PROTONIX) 40 MG tablet Take 1 tablet (40 mg total) by mouth daily. 03/07/16   Unk Lightning, PA  traMADol (ULTRAM) 50 MG tablet Take 1 tablet (50 mg total) by mouth every 12 (twelve) hours as needed for moderate pain. 09/16/16   Leatha Gilding, MD     Allergies:     Allergies  Allergen Reactions  . Aspirin Anaphylaxis  . Aspirin Nausea And Vomiting    Hurts stomach     Physical Exam:   Vitals  Blood pressure (!) 180/67, pulse 64, temperature 97.8 F (36.6 C), temperature source Oral, resp. rate 13, height 5\' 7"  (1.702 m), weight 56 kg (123 lb 7.3 oz), SpO2 100 %.  1.  General: Appears in no acute distress  2. Psychiatric:  awake alert, responds to questions  3. Neurologic: Follows commands, moving all extremities, has underlying aphasia/dysarthria, no facial droop  4. Eyes :  anicteric sclerae, moist conjunctivae with no lid lag. PERRLA.  5. ENMT:  Oropharynx clear with moist mucous membranes and good dentition  6. Neck:  supple, no cervical lymphadenopathy appriciated, No thyromegaly  7. Respiratory : Normal respiratory effort, good air movement bilaterally,clear to  auscultation bilaterally  8. Cardiovascular : RRR, no gallops, rubs or murmurs, no leg edema  9. Gastrointestinal:  Positive bowel sounds, abdomen soft, non-tender to palpation,no hepatosplenomegaly, no rigidity or guarding       10. Skin:  No cyanosis, normal texture and turgor, no rash, lesions or ulcers  11.Musculoskeletal:  Good muscle tone,  joints appear  normal , no effusions,  normal range of motion    Data Review:    CBC Recent Labs  Lab 05/06/17 1701 05/06/17 1730  WBC  --  4.8  HGB 12.2 11.4*  HCT 36.0 36.1  PLT  --  199  MCV  --  87.6  MCH  --  27.7  MCHC  --  31.6  RDW  --  14.2  LYMPHSABS  --  0.8  MONOABS  --  0.3  EOSABS  --  0.0  BASOSABS  --  0.0   ------------------------------------------------------------------------------------------------------------------  Chemistries  Recent Labs  Lab 05/06/17 1701 05/06/17 1730  NA 137 136  K 4.9 4.8  CL 105 105  CO2  --  23  GLUCOSE 180* 179*  BUN 25* 24*  CREATININE 1.70* 1.80*  CALCIUM  --  9.1  AST  --  24  ALT  --  14  ALKPHOS  --  50  BILITOT  --  0.5   ------------------------------------------------------------------------------------------------------------------  ------------------------------------------------------------------------------------------------------------------ GFR: Estimated Creatinine Clearance: 22.4 mL/min (A) (by C-G formula based on SCr of 1.8 mg/dL (H)). Liver Function Tests: Recent Labs  Lab 05/06/17 1730  AST 24  ALT 14  ALKPHOS 50  BILITOT 0.5  PROT 6.6  ALBUMIN 3.3*   No results for input(s): LIPASE, AMYLASE in the last 168 hours. No results for input(s): AMMONIA in the last 168 hours. Coagulation Profile: Recent Labs  Lab 05/06/17 1730  INR 1.05     --------------------------------------------------------------------------------------------------------------- Urine analysis:    Component Value Date/Time   COLORURINE YELLOW 03/15/2017 2207   APPEARANCEUR HAZY (A) 03/15/2017 2207   LABSPEC 1.014 03/15/2017 2207   PHURINE 5.0 03/15/2017 2207   GLUCOSEU NEGATIVE 03/15/2017 2207   HGBUR NEGATIVE 03/15/2017 2207   BILIRUBINUR NEGATIVE 03/15/2017 2207   KETONESUR NEGATIVE 03/15/2017 2207   PROTEINUR NEGATIVE 03/15/2017 2207   UROBILINOGEN 0.2 05/27/2012 1416   NITRITE NEGATIVE 03/15/2017 2207    LEUKOCYTESUR MODERATE (A) 03/15/2017 2207      Imaging Results:    Ct Head Code Stroke Wo Contrast  Result Date: 05/06/2017 CLINICAL DATA:  Code stroke. Code stroke. Left-sided weakness and left facial droop. EXAM: CT HEAD WITHOUT CONTRAST TECHNIQUE: Contiguous axial images were obtained from the base of the skull through the vertex without intravenous contrast. COMPARISON:  None. FINDINGS: Brain: Multiple well-defined low-density areas involving cortex and white matter of the bilateral frontal, left parietal, and right occipital lobes consistent with multifocal remote infarct. No definite acute infarct. No hemorrhage, hydrocephalus, or masslike finding. Vascular: Atherosclerotic calcification.  No hyperdense vessel. Skull: No acute finding Sinuses/Orbits: No acute finding. Other: These results were communicated to Dr. Otelia Limes at 5:03 pmon 4/14/2019by text page via the [facility] messaging system. ASPECTS ([place] Stroke Program Early CT Score) Not scored in the setting of remote MCA territory infarcts. IMPRESSION: 1. No hemorrhage or definite acute infarct. 2. Multifocal remote appearing infarcts in the bilateral MCA, left ACA, and right PCA distributions. Electronically Signed   By: Marnee Spring M.D.   On: 05/06/2017 17:05    My personal review of EKG: Rhythm NSR   Assessment & Plan:    Active Problems:   HLD (hyperlipidemia)   Hypertension   History of CVA (cerebrovascular accident)   TIA (transient ischemic attack)   1. TIA vs stroke-placed under observation, will obtain MRI/MRA brain, CT is negative for stroke.  Check echocardiogram, carotid duplex, hemoglobin A1c, lipid profile.  Continue with Plavix.  Neuro hospitalist has seen the patient in the ED. 2. Hypertension-we will hold antihypertensive medication Cozaar for permissive hypertension. 3. History of hyperlipidemia-check lipid profile in a.m. 4. Chronic kidney disease stage III-patient's creatinine is 1.80, it was 1.61 and February  2019.  We will start gentle IV hydration with normal saline.  Follow BMP in a.m. 5. Chronic diastolic CHF-patient has history of grade 1 diastolic dysfunction, currently appears euvolemic.  Started on gentle IV hydration as above.  Will monitor.    DVT Prophylaxis-   Lovenox   AM Labs Ordered, also please review Full Orders  Family Communication: No family at bedside  Code Status: Full code, presumed  Admission status: Observation  Time spent in minutes : 60 minutes   Meredeth Ide M.D on 05/06/2017 at 7:58 PM  Between 7am to 7pm - Pager - [phone]. After 7pm go to [email or web address] - password [facility]  [facility] - Office  [phone]

## 2017-05-06 NOTE — ED Provider Notes (Signed)
[name]GENCY DE[facility]MENT Provider Note   CSN: [id number] Arrival date & time: 05/06/17  1639     History   Chief Complaint Chief Complaint  Patient presents with  . Code Stroke    HPI [name] is a 82 y.o. female.  Patient reportedly here for trouble talking, and new left-sided stroke symptoms.  She came by EMS, was seen as a code stroke by the neuro hospitalist, in triage.  As of the time of my evaluation, she is not a candidate for TPA by report of the rapid response nurse who has communicated with the neuro hospitalist.  Patient is alert and responsive but I cannot understand her words.  Level 5 caveat-difficulty speaking  HPI  Past Medical History:  Diagnosis Date  . Arthritis    hands  . Asthma   . Chronic diastolic CHF (congestive heart failure) (HCC)   . Chronic kidney disease (CKD), stage III (moderate) (HCC)   . Dementia   . Depression   . Diabetes mellitus without complication (HCC)   . Diverticulosis   . Hx of adenomatous colonic polyps   . Hyperlipidemia   . Hypertension   . Hypochromic anemia   . Internal hemorrhoids   . Stroke [facility]) May 2009   multiple, last one Nov 2012, 2 strokes with right sided deficits and slurred speech  . Stroke [facility])     Patient Active Problem List   Diagnosis Date Noted  . RUQ abdominal pain 09/11/2016  . Postural dizziness with presyncope 09/11/2016  . RUQ pain 09/11/2016  . Stroke (cerebrum) (HCC) 04/30/2016  . Hyperkalemia 04/30/2016  . Acute encephalopathy 04/30/2016  . Chronic diastolic CHF (congestive heart failure) (HCC) 04/30/2016  . Multiple duodenal ulcers   . RLQ abdominal pain   . Gallstones   . Elevated liver enzymes   . Abnormal finding on GI tract imaging   . Pancreas cyst   . Choledocholithiasis 01/30/2016  . Acute kidney injury (HCC) 01/30/2016  . Non-intractable vomiting with nausea   . Bleeding 11/23/2015  . Chronic kidney disease 05/29/2012  . Skin ulcer,  stage 2 (HCC) 05/29/2012  . Internal hemorrhoid, bleeding 05/29/2012  . Severe protein-calorie malnutrition (HCC) 03/24/2012  . Hyponatremia 03/23/2012  . Hypokalemia 03/23/2012  . Urinary tract infection 03/23/2012  . Physical deconditioning 03/23/2012  . CKD (chronic kidney disease) stage 3, GFR 30-59 ml/min (HCC) 12/01/2010  . Encephalopathy 12/01/2010  . TIA (transient ischemic attack) 11/28/2010  . ASTHMA 08/25/2008  . COLONIC POLYPS, ADENOMATOUS, HX OF 08/25/2008  . HLD (hyperlipidemia) 08/17/2008  . Microcytic hypochromic anemia 08/17/2008  . Hypertension 08/17/2008  . History of CVA (cerebrovascular accident) 08/17/2008  . DIVERTICULITIS, COLON 08/17/2008  . HYPERGLYCEMIA 08/17/2008    Past Surgical History:  Procedure Laterality Date  . ABDOMINAL HYSTERECTOMY    . ABDOMINAL SURGERY    . BOWEL RESECTION    . CHOLECYSTECTOMY N/A 09/15/2016   Procedure: LAPAROSCOPIC CHOLECYSTECTOMY WITH INTRAOPERATIVE CHOLANGIOGRAM;  Surgeon: Ovidio Kin, MD;  Location: [facility] ORS;  Service: General;  Laterality: N/A;  . COLECTOMY    . ESOPHAGOGASTRODUODENOSCOPY N/A 02/01/2016   Procedure: ESOPHAGOGASTRODUODENOSCOPY (EGD);  Surgeon: Beverley Fiedler, MD;  Location: [facility] ENDOSCOPY;  Service: Endoscopy;  Laterality: N/A;  . FLEXIBLE SIGMOIDOSCOPY N/A 05/28/2012   Procedure: FLEXIBLE SIGMOIDOSCOPY;  Surgeon: Hart Carwin, MD;  Location: [facility] ENDOSCOPY;  Service: Endoscopy;  Laterality: N/A;  . OTHER SURGICAL HISTORY     2 ear surgeries  . PERIPHERAL VASCULAR CATHETERIZATION N/A 07/21/2014  Procedure: Abdominal Aortogram w/Lower Extremity;  Surgeon: [name], MD;  Location: [facility] INVASIVE CV LAB;  Service: Cardiovascular;  Laterality: N/A;  . PERIPHERAL VASCULAR CATHETERIZATION N/A 11/23/2015   Procedure: Abdominal Aortogram w/Lower Extremity;  Surgeon: [name], MD;  Location: [facility] INVASIVE CV LAB;  Service: Cardiovascular;  Laterality: N/A;  . PERIPHERAL VASCULAR CATHETERIZATION  11/23/2015    Procedure: Peripheral Vascular Intervention;  Surgeon: [name], MD;  Location: [facility] INVASIVE CV LAB;  Service: Cardiovascular;;  Failed PVI of AT and Peroneal on left side  . SUBTOTAL COLECTOMY  2010   for stricturing from bouts of diverticulitis along with hx recurrent diverticular bleeds.  the surgery was a total abdominal colec  . TONSILLECTOMY    . VASCULAR SURGERY       OB History   None      Home Medications    Prior to Admission medications   Medication Sig Start Date End Date Taking? Authorizing Provider  Cholecalciferol (VITAMIN D3) 1000 UNITS CAPS Take 1,000 Units by mouth daily.     [provider]  clopidogrel (PLAVIX) 75 MG tablet Take 1 tablet (75 mg total) by mouth daily. Hold until next week, restart on Monday 8/27 09/16/16   Leatha GildingGherghe, Costin M, MD  loratadine (CLARITIN) 10 MG tablet Take 10 mg by mouth daily.     [provider]  losartan (COZAAR) 25 MG tablet Take 1 tablet (25 mg total) by mouth daily. 02/03/16   Albertine GratesXu, Fang, MD  Melatonin 300 MCG TABS Take 1 tablet by mouth at bedtime.    [provider]  pantoprazole (PROTONIX) 40 MG tablet Take 1 tablet (40 mg total) by mouth daily. 03/07/16   Unk LightningLemmon, Jennifer Lynne, PA  traMADol (ULTRAM) 50 MG tablet Take 1 tablet (50 mg total) by mouth every 12 (twelve) hours as needed for moderate pain. 09/16/16   Leatha GildingGherghe, Costin M, MD    Family History Family History  Problem Relation Age of Onset  . Diabetes Paternal Aunt   . Stroke Father   . Heart disease Father   . Diabetes Father   . Depression Father   . HIV Brother   . Prostate cancer Unknown   . Cancer Sister        unknown type  . Colon cancer Neg Hx     Social History Social History   Tobacco Use  . Smoking status: Never Smoker  . Smokeless tobacco: Never Used  Substance Use Topics  . Alcohol use: No  . Drug use: No     Allergies   Aspirin and Aspirin   Review of Systems Review of Systems  Unable to perform ROS:  Other     Physical Exam Updated Vital Signs BP (!) 177/60   Pulse 62   Temp 97.8 F (36.6 C) (Oral)   Resp 11   Ht 5\' 7"  (1.702 m)   Wt 56 kg (123 lb 7.3 oz)   SpO2 100%   BMI 19.34 kg/m   Physical Exam  Constitutional: She appears well-developed. She appears distressed (She appears uncomfortable).  Elderly, frail  HENT:  Head: Normocephalic and atraumatic.  Eyes: Pupils are equal, round, and reactive to light. Conjunctivae and EOM are normal.  Neck: Normal range of motion and phonation normal. Neck supple.  Cardiovascular: Normal rate and regular rhythm.  Pulmonary/Chest: Effort normal and breath sounds normal. No respiratory distress. She exhibits no tenderness.  Abdominal: Soft. She exhibits no distension. There is no tenderness. There is no  guarding.  Musculoskeletal: Normal range of motion.  Neurological: She is alert.  Alert and verbally responsive, some words are intelligible but most are not.  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  He follows commands accurately, for physical examination testing.  She is able to elevate arms and legs independently, without limitation.  No clear facial droop.  Skin: Skin is warm and dry.  Psychiatric: She has a normal mood and affect. Her behavior is normal.  Nursing note and vitals reviewed.    ED Treatments / Results  Labs (all labs ordered are listed, but only abnormal results are displayed) Labs Reviewed  CBC - Abnormal; Notable for the following components:      Result Value   Hemoglobin 11.4 (*)    All other components within normal limits  COMPREHENSIVE METABOLIC PANEL - Abnormal; Notable for the following components:   Glucose, Bld 179 (*)    BUN 24 (*)    Creatinine, Ser 1.80 (*)    Albumin 3.3 (*)    GFR calc non Af Amer 26 (*)    GFR calc Af Amer 30 (*)    All other components within normal limits  I-STAT CHEM 8, ED - Abnormal; Notable for the following components:   BUN 25 (*)     Creatinine, Ser 1.70 (*)    Glucose, Bld 180 (*)    Calcium, Ion 1.14 (*)    All other components within normal limits  PROTIME-INR  APTT  DIFFERENTIAL  I-STAT TROPONIN, ED  CBG MONITORING, ED    EKG EKG Interpretation  Date/Time:  Sunday May 06 2017 17:16:32 EDT Ventricular Rate:  63 PR Interval:    QRS Duration: 89 QT Interval:  453 QTC Calculation: 464 R Axis:   58 Text Interpretation:  Sinus rhythm Prolonged PR interval Abnormal R-wave progression, early transition Artifact in lead(s) I aVL V1 V2 since last tracing no significant change Confirmed by Mancel Bale [phone]) on 05/06/2017 7:06:36 PM   Radiology Ct Head Code Stroke Wo Contrast  Result Date: 05/06/2017 CLINICAL DATA:  Code stroke. Code stroke. Left-sided weakness and left facial droop. EXAM: CT HEAD WITHOUT CONTRAST TECHNIQUE: Contiguous axial images were obtained from the base of the skull through the vertex without intravenous contrast. COMPARISON:  None. FINDINGS: Brain: Multiple well-defined low-density areas involving cortex and white matter of the bilateral frontal, left parietal, and right occipital lobes consistent with multifocal remote infarct. No definite acute infarct. No hemorrhage, hydrocephalus, or masslike finding. Vascular: Atherosclerotic calcification.  No hyperdense vessel. Skull: No acute finding Sinuses/Orbits: No acute finding. Other: These results were communicated to Dr. Otelia Limes at 5:03 pmon 4/14/2019by text page via the [facility] messaging system. ASPECTS ([place] Stroke Program Early CT Score) Not scored in the setting of remote MCA territory infarcts. IMPRESSION: 1. No hemorrhage or definite acute infarct. 2. Multifocal remote appearing infarcts in the bilateral MCA, left ACA, and right PCA distributions. Electronically Signed   By: Marnee Spring M.D.   On: 05/06/2017 17:05    Procedures Procedures (including critical care time)  Medications Ordered in ED Medications - No data to  display   Initial Impression / Assessment and Plan / ED Course  I have reviewed the triage vital signs and the nursing notes.  Pertinent labs & imaging results that were available during my care of the patient were reviewed by me and considered in my medical decision making (see chart for details).  Clinical Course as of May 06 1924  Sun May 06, 2017  1923 Case has been discussed by me with the neuro hospitalist Dr. Otelia Limes, by phone.  He has been unable to put a consult in yet but advises admission to hospitalist service and stroke evaluation.  He feels that the patient may have a lacunar syndrome with new weakness in her left face and left hand.   [EW]    Clinical Course User Index [EW] Mancel Bale, MD     Patient Vitals for the past 24 hrs:  BP Temp Temp src Pulse Resp SpO2 Height Weight  05/06/17 1845 - - - 62 11 100 % - -  05/06/17 1830 (!) 177/60 - - (!) 59 14 100 % - -  05/06/17 1815 (!) 176/70 - - 64 19 100 % - -  05/06/17 1745 (!) 159/63 - - 60 11 100 % - -  05/06/17 1714 - - - - - - 5\' 7"  (1.702 m) 56 kg (123 lb 7.3 oz)  05/06/17 1713 (!) 146/44 97.8 F (36.6 C) Oral - - - - -  05/06/17 1713 - - - 63 14 99 % - -  05/06/17 1712 (!) 146/44 - - - 12 - - -    7:41 PM Reevaluation with update and discussion. After initial assessment and treatment, an updated evaluation reveals no change in clinical status this time.  Patient's daughter still have not arrived.Mancel Bale     MDM-debilitated patient presenting with possible new left-sided stroke syndrome.  CT did not indicate acute infarct.  Seen by neurology who recommends admission with "stroke workup."  MRI ordered by me, hospitalist consulted for admission and further evaluation.  Nursing Notes Reviewed/ Care Coordinated Applicable Imaging Reviewed Interpretation of Laboratory Data incorporated into ED treatment  7:27 PM-Consult complete with hospitalist. Patient case explained and discussed.  He agrees to admit  patient for further evaluation and treatment. Call ended at 7:40 PM  Plan: Admit  Final Clinical Impressions(s) / ED Diagnoses   Final diagnoses:  Cerebral infarction, unspecified mechanism [facility])    ED Discharge Orders    None       Mancel Bale, MD 05/07/17 1247

## 2017-05-07 ENCOUNTER — Observation Stay (HOSPITAL_BASED_OUTPATIENT_CLINIC_OR_DEPARTMENT_OTHER): Payer: Medicare (Managed Care)

## 2017-05-07 ENCOUNTER — Other Ambulatory Visit: Payer: Self-pay

## 2017-05-07 ENCOUNTER — Encounter (HOSPITAL_COMMUNITY): Payer: Self-pay | Admitting: General Practice

## 2017-05-07 ENCOUNTER — Observation Stay (HOSPITAL_COMMUNITY): Payer: Medicare (Managed Care)

## 2017-05-07 DIAGNOSIS — I639 Cerebral infarction, unspecified: Secondary | ICD-10-CM | POA: Diagnosis not present

## 2017-05-07 DIAGNOSIS — I6300 Cerebral infarction due to thrombosis of unspecified precerebral artery: Secondary | ICD-10-CM | POA: Diagnosis not present

## 2017-05-07 DIAGNOSIS — J45909 Unspecified asthma, uncomplicated: Secondary | ICD-10-CM | POA: Diagnosis present

## 2017-05-07 DIAGNOSIS — E1122 Type 2 diabetes mellitus with diabetic chronic kidney disease: Secondary | ICD-10-CM | POA: Diagnosis present

## 2017-05-07 DIAGNOSIS — R471 Dysarthria and anarthria: Secondary | ICD-10-CM | POA: Diagnosis not present

## 2017-05-07 DIAGNOSIS — Z90710 Acquired absence of both cervix and uterus: Secondary | ICD-10-CM | POA: Diagnosis not present

## 2017-05-07 DIAGNOSIS — I63231 Cerebral infarction due to unspecified occlusion or stenosis of right carotid arteries: Secondary | ICD-10-CM | POA: Diagnosis not present

## 2017-05-07 DIAGNOSIS — R29810 Facial weakness: Secondary | ICD-10-CM | POA: Diagnosis present

## 2017-05-07 DIAGNOSIS — I129 Hypertensive chronic kidney disease with stage 1 through stage 4 chronic kidney disease, or unspecified chronic kidney disease: Secondary | ICD-10-CM | POA: Diagnosis not present

## 2017-05-07 DIAGNOSIS — R29711 NIHSS score 11: Secondary | ICD-10-CM | POA: Diagnosis present

## 2017-05-07 DIAGNOSIS — Z7902 Long term (current) use of antithrombotics/antiplatelets: Secondary | ICD-10-CM | POA: Diagnosis not present

## 2017-05-07 DIAGNOSIS — Z79899 Other long term (current) drug therapy: Secondary | ICD-10-CM | POA: Diagnosis not present

## 2017-05-07 DIAGNOSIS — G459 Transient cerebral ischemic attack, unspecified: Secondary | ICD-10-CM | POA: Diagnosis present

## 2017-05-07 DIAGNOSIS — E785 Hyperlipidemia, unspecified: Secondary | ICD-10-CM | POA: Diagnosis present

## 2017-05-07 DIAGNOSIS — G8194 Hemiplegia, unspecified affecting left nondominant side: Secondary | ICD-10-CM | POA: Diagnosis present

## 2017-05-07 DIAGNOSIS — N183 Chronic kidney disease, stage 3 (moderate): Secondary | ICD-10-CM | POA: Diagnosis present

## 2017-05-07 DIAGNOSIS — F0390 Unspecified dementia without behavioral disturbance: Secondary | ICD-10-CM | POA: Diagnosis present

## 2017-05-07 DIAGNOSIS — Z8673 Personal history of transient ischemic attack (TIA), and cerebral infarction without residual deficits: Secondary | ICD-10-CM | POA: Diagnosis not present

## 2017-05-07 DIAGNOSIS — I63411 Cerebral infarction due to embolism of right middle cerebral artery: Secondary | ICD-10-CM | POA: Diagnosis present

## 2017-05-07 DIAGNOSIS — I5032 Chronic diastolic (congestive) heart failure: Secondary | ICD-10-CM

## 2017-05-07 DIAGNOSIS — I130 Hypertensive heart and chronic kidney disease with heart failure and stage 1 through stage 4 chronic kidney disease, or unspecified chronic kidney disease: Secondary | ICD-10-CM | POA: Diagnosis present

## 2017-05-07 DIAGNOSIS — Z886 Allergy status to analgesic agent status: Secondary | ICD-10-CM | POA: Diagnosis not present

## 2017-05-07 LAB — LIPID PANEL
Cholesterol: 226 mg/dL — ABNORMAL HIGH (ref 0–200)
HDL: 70 mg/dL (ref >40)
LDL Cholesterol: 145 mg/dL — ABNORMAL HIGH (ref 0–99)
Total CHOL/HDL Ratio: 3.2 RATIO
Triglycerides: 56 mg/dL (ref <150)
VLDL: 11 mg/dL (ref 0–40)

## 2017-05-07 LAB — ECHOCARDIOGRAM COMPLETE
Height: 66 in
WEIGHTICAEL: 1915.36 [oz_av]

## 2017-05-07 LAB — HEMOGLOBIN A1C
Hgb A1c MFr Bld: 5.7 % — ABNORMAL HIGH (ref 4.8–5.6)
Mean Plasma Glucose: 116.89 mg/dL

## 2017-05-07 MED ORDER — ATORVASTATIN CALCIUM 80 MG PO TABS
80.0000 mg | ORAL_TABLET | Freq: Every day | ORAL | Status: DC
  Administered 2017-05-07: 80 mg via ORAL
  Filled 2017-05-07: qty 1

## 2017-05-07 NOTE — Progress Notes (Signed)
Stroke Team Progress Note  [name] is an 82 y.o. female presenting with acute onset of right facial droop and left upper extremity weakness. She has a history of prior stroke. Multiple old infarctions are seen on CT. The patient is a poor historian.   LSN: 2:30 PM tPA Given: No: On examination of new deficits endorsed by the patient, consisting of left facial weakness and left hand/wrist weakness, the findings are relatively minor and risks of hemorrhage are felt to significantly outweigh the potential benefits of tPA   SUBJECTIVE Patient states a left facial droop and left hand weakness have improving but then not back to baseline.She has some residual right hand weakness from a previous stroke and some contractures in the right hand.Her daughter is present at the bedside throughout this visit. She does have some mild cognitive issues at baseline but is able to ambulate with a walker by herself MRI scan of the brain showed an embolic right MCA branch infarct. Carotid ultrasound suggests proximal right ICA occlusion versus high-grade stenosis OBJECTIVE Most recent Vital Signs: Temp: 98.1 F (36.7 C) (04/15 1246) Temp Source: Oral (04/15 1246) BP: 173/62 (04/15 1246) Pulse Rate: 67 (04/15 1246) Respiratory Rate: 17 O2 Saturdation: 99%  CBG (last 3)  No results for input(s): GLUCAP in the last 72 hours.  Diet: Fall precautions DIET DYS 2 Room service appropriate? Yes; Fluid consistency: Thin   liquids  Activity: Up with assistance   VTE Prophylaxis:   scds  Studies: Results for orders placed or performed during the hospital encounter of 05/06/17 (from the past 24 hour(s))  I-stat troponin, ED     Status: None   Collection Time: 05/06/17  4:59 PM  Result Value Ref Range   Troponin i, poc 0.00 0.00 - 0.08 ng/mL   Comment 3          I-Stat Chem 8, ED     Status: Abnormal   Collection Time: 05/06/17  5:01 PM  Result Value Ref Range   Sodium 137 135 - 145 mmol/L    Potassium 4.9 3.5 - 5.1 mmol/L   Chloride 105 101 - 111 mmol/L   BUN 25 (H) 6 - 20 mg/dL   Creatinine, Ser 1.61 (H) 0.44 - 1.00 mg/dL   Glucose, Bld 096 (H) 65 - 99 mg/dL   Calcium, Ion 0.45 (L) 1.15 - 1.40 mmol/L   TCO2 23 22 - 32 mmol/L   Hemoglobin 12.2 12.0 - 15.0 g/dL   HCT 40.9 81.1 - 91.4 %  Protime-INR     Status: None   Collection Time: 05/06/17  5:30 PM  Result Value Ref Range   Prothrombin Time 13.6 11.4 - 15.2 seconds   INR 1.05   APTT     Status: None   Collection Time: 05/06/17  5:30 PM  Result Value Ref Range   aPTT 28 24 - 36 seconds  CBC     Status: Abnormal   Collection Time: 05/06/17  5:30 PM  Result Value Ref Range   WBC 4.8 4.0 - 10.5 K/uL   RBC 4.12 3.87 - 5.11 MIL/uL   Hemoglobin 11.4 (L) 12.0 - 15.0 g/dL   HCT 78.2 95.6 - 21.3 %   MCV 87.6 78.0 - 100.0 fL   MCH 27.7 26.0 - 34.0 pg   MCHC 31.6 30.0 - 36.0 g/dL   RDW 08.6 57.8 - 46.9 %   Platelets 199 150 - 400 K/uL  Differential     Status: None   Collection  Time: 05/06/17  5:30 PM  Result Value Ref Range   Neutrophils Relative % 78 %   Neutro Abs 3.7 1.7 - 7.7 K/uL   Lymphocytes Relative 16 %   Lymphs Abs 0.8 0.7 - 4.0 K/uL   Monocytes Relative 6 %   Monocytes Absolute 0.3 0.1 - 1.0 K/uL   Eosinophils Relative 0 %   Eosinophils Absolute 0.0 0.0 - 0.7 K/uL   Basophils Relative 0 %   Basophils Absolute 0.0 0.0 - 0.1 K/uL  Comprehensive metabolic panel     Status: Abnormal   Collection Time: 05/06/17  5:30 PM  Result Value Ref Range   Sodium 136 135 - 145 mmol/L   Potassium 4.8 3.5 - 5.1 mmol/L   Chloride 105 101 - 111 mmol/L   CO2 23 22 - 32 mmol/L   Glucose, Bld 179 (H) 65 - 99 mg/dL   BUN 24 (H) 6 - 20 mg/dL   Creatinine, Ser 9.81 (H) 0.44 - 1.00 mg/dL   Calcium 9.1 8.9 - 19.1 mg/dL   Total Protein 6.6 6.5 - 8.1 g/dL   Albumin 3.3 (L) 3.5 - 5.0 g/dL   AST 24 15 - 41 U/L   ALT 14 14 - 54 U/L   Alkaline Phosphatase 50 38 - 126 U/L   Total Bilirubin 0.5 0.3 - 1.2 mg/dL   GFR calc non Af  Amer 26 (L) >60 mL/min   GFR calc Af Amer 30 (L) >60 mL/min   Anion gap 8 5 - 15  Hemoglobin A1c     Status: Abnormal   Collection Time: 05/07/17  3:27 AM  Result Value Ref Range   Hgb A1c MFr Bld 5.7 (H) 4.8 - 5.6 %   Mean Plasma Glucose 116.89 mg/dL  Lipid panel     Status: Abnormal   Collection Time: 05/07/17  3:27 AM  Result Value Ref Range   Cholesterol 226 (H) 0 - 200 mg/dL   Triglycerides 56 <478 mg/dL   HDL 70 >29 mg/dL   Total CHOL/HDL Ratio 3.2 RATIO   VLDL 11 0 - 40 mg/dL   LDL Cholesterol 562 (H) 0 - 99 mg/dL     Dg Chest 2 View  Result Date: 05/06/2017 CLINICAL DATA:  TIA EXAM: CHEST - 2 VIEW COMPARISON:  None. FINDINGS: No significant pleural effusion. No focal airspace disease. Heart size within normal limits. Aortic atherosclerosis. No pneumothorax. IMPRESSION: No active cardiopulmonary disease. Patchy atelectasis at the left base. Electronically Signed   By: Jasmine Pang M.D.   On: 05/06/2017 22:20   Mr Brain Wo Contrast  Result Date: 05/06/2017 CLINICAL DATA:  Mild aphasia, LEFT-sided weakness, gait instability, RIGHT facial droop, dysphagia. Last seen at baseline 1430 hours. History of stroke, hypertension, hyperlipidemia and dementia. EXAM: MRI HEAD WITHOUT CONTRAST MRA HEAD WITHOUT CONTRAST TECHNIQUE: Multiplanar, multiecho pulse sequences of the brain and surrounding structures were obtained without intravenous contrast. Angiographic images of the head were obtained using MRA technique without contrast. COMPARISON:  CT HEAD May 06, 2017. MRI of the head August 30, 2016. MRA head November 29, 2010. FINDINGS: MRI HEAD FINDINGS-multiple sequences are moderately or severely motion degraded. INTRACRANIAL CONTENTS: Numerous small foci of reduced diffusion RIGHT frontal, parietal lobes measuring to 13 mm predominately involving the cortex identifiable foci demonstrate low ADC values. RIGHT greater than LEFT occipital lobe, bilateral frontal including LEFT mesial frontal lobe  and LEFT parietal encephalomalacia. Superimposed curvilinear susceptibility artifact LEFT parietal encephalomalacia. No susceptibility artifact to suggest lobar hematoma. Old small bilateral  cerebellar infarcts. Moderate to severe parenchymal brain volume loss with cavum septum pellucidum. Scattered supratentorial white matter FLAIR T2 hyperintensities exclusive a aforementioned abnormality compatible with mild chronic small vessel ischemic disease. No abnormal extra-axial fluid collections. VASCULAR: Attenuated RIGHT ICA flow void. SKULL AND UPPER CERVICAL SPINE: Empty sella. No suspicious calvarial bone marrow signal. Craniocervical junction maintained. SINUSES/ORBITS: The mastoid air-cells and included paranasal sinuses are well-aerated.The included ocular globes and orbital contents are non-suspicious. OTHER: None. MRA HEAD FINDINGS ANTERIOR CIRCULATION: Asymmetrically smaller RIGHT internal carotid artery with poor flow related enhancement. Moderate luminal irregularity LEFT cavernous ICA compatible with atherosclerosis, vessel is patent. 2 mm superiorly directed outpouching LEFT cavernous ICA. Patent bilateral anterior and middle cerebral arteries with slower flow RIGHT MCA. Severe stenosis LEFT M2 origin. No large vessel occlusion. POSTERIOR CIRCULATION: LEFT vertebral artery out of field-of-view versus occluded. Patent basilar artery and main branch vessels. Severe stenosis RIGHT P2 segment. Patent bilateral posterior cerebral arteries. Basilar artery is patent, with normal flow related enhancement of the main branch vessels. Patent posterior cerebral arteries. Patent bilateral posterior communicating arteries. No large vessel occlusion, flow limiting stenosis,  aneurysm. ANATOMIC VARIANTS: Hypoplastic RIGHT A1 segment. Source images and MIP images were reviewed. IMPRESSION: MRI HEAD: 1. Motion degraded examination. 2. Multiple small acute RIGHT MCA and posterior watershed territory nonhemorrhagic infarcts.  3. Old bilateral PCA, bilateral MCA and LEFT ACA territory infarcts. Old small cerebellar infarcts. 4. Moderate to severe parenchymal brain volume loss. MRA HEAD: 1. Small irregular RIGHT ICA seen with proximal severe stenosis, progressed from 2012. 2. Moderate stenosis LEFT cavernous ICA. 2 mm suspected LEFT cavernous ICA aneurysm. 3. Severe stenosis LEFT M2 and RIGHT P2 segments. 4. For the above findings, recommend CTA HEAD and neck for further characterization. Electronically Signed   By: Awilda Metro M.D.   On: 05/06/2017 23:33   Mr Maxine Glenn Head Wo Contrast  Result Date: 05/06/2017 CLINICAL DATA:  Mild aphasia, LEFT-sided weakness, gait instability, RIGHT facial droop, dysphagia. Last seen at baseline 1430 hours. History of stroke, hypertension, hyperlipidemia and dementia. EXAM: MRI HEAD WITHOUT CONTRAST MRA HEAD WITHOUT CONTRAST TECHNIQUE: Multiplanar, multiecho pulse sequences of the brain and surrounding structures were obtained without intravenous contrast. Angiographic images of the head were obtained using MRA technique without contrast. COMPARISON:  CT HEAD May 06, 2017. MRI of the head August 30, 2016. MRA head November 29, 2010. FINDINGS: MRI HEAD FINDINGS-multiple sequences are moderately or severely motion degraded. INTRACRANIAL CONTENTS: Numerous small foci of reduced diffusion RIGHT frontal, parietal lobes measuring to 13 mm predominately involving the cortex identifiable foci demonstrate low ADC values. RIGHT greater than LEFT occipital lobe, bilateral frontal including LEFT mesial frontal lobe and LEFT parietal encephalomalacia. Superimposed curvilinear susceptibility artifact LEFT parietal encephalomalacia. No susceptibility artifact to suggest lobar hematoma. Old small bilateral cerebellar infarcts. Moderate to severe parenchymal brain volume loss with cavum septum pellucidum. Scattered supratentorial white matter FLAIR T2 hyperintensities exclusive a aforementioned abnormality compatible  with mild chronic small vessel ischemic disease. No abnormal extra-axial fluid collections. VASCULAR: Attenuated RIGHT ICA flow void. SKULL AND UPPER CERVICAL SPINE: Empty sella. No suspicious calvarial bone marrow signal. Craniocervical junction maintained. SINUSES/ORBITS: The mastoid air-cells and included paranasal sinuses are well-aerated.The included ocular globes and orbital contents are non-suspicious. OTHER: None. MRA HEAD FINDINGS ANTERIOR CIRCULATION: Asymmetrically smaller RIGHT internal carotid artery with poor flow related enhancement. Moderate luminal irregularity LEFT cavernous ICA compatible with atherosclerosis, vessel is patent. 2 mm superiorly directed outpouching LEFT cavernous ICA. Patent bilateral anterior and middle  cerebral arteries with slower flow RIGHT MCA. Severe stenosis LEFT M2 origin. No large vessel occlusion. POSTERIOR CIRCULATION: LEFT vertebral artery out of field-of-view versus occluded. Patent basilar artery and main branch vessels. Severe stenosis RIGHT P2 segment. Patent bilateral posterior cerebral arteries. Basilar artery is patent, with normal flow related enhancement of the main branch vessels. Patent posterior cerebral arteries. Patent bilateral posterior communicating arteries. No large vessel occlusion, flow limiting stenosis,  aneurysm. ANATOMIC VARIANTS: Hypoplastic RIGHT A1 segment. Source images and MIP images were reviewed. IMPRESSION: MRI HEAD: 1. Motion degraded examination. 2. Multiple small acute RIGHT MCA and posterior watershed territory nonhemorrhagic infarcts. 3. Old bilateral PCA, bilateral MCA and LEFT ACA territory infarcts. Old small cerebellar infarcts. 4. Moderate to severe parenchymal brain volume loss. MRA HEAD: 1. Small irregular RIGHT ICA seen with proximal severe stenosis, progressed from 2012. 2. Moderate stenosis LEFT cavernous ICA. 2 mm suspected LEFT cavernous ICA aneurysm. 3. Severe stenosis LEFT M2 and RIGHT P2 segments. 4. For the above  findings, recommend CTA HEAD and neck for further characterization. Electronically Signed   By: Awilda Metro M.D.   On: 05/06/2017 23:33   Ct Head Code Stroke Wo Contrast  Result Date: 05/06/2017 CLINICAL DATA:  Code stroke. Code stroke. Left-sided weakness and left facial droop. EXAM: CT HEAD WITHOUT CONTRAST TECHNIQUE: Contiguous axial images were obtained from the base of the skull through the vertex without intravenous contrast. COMPARISON:  None. FINDINGS: Brain: Multiple well-defined low-density areas involving cortex and white matter of the bilateral frontal, left parietal, and right occipital lobes consistent with multifocal remote infarct. No definite acute infarct. No hemorrhage, hydrocephalus, or masslike finding. Vascular: Atherosclerotic calcification.  No hyperdense vessel. Skull: No acute finding Sinuses/Orbits: No acute finding. Other: These results were communicated to Dr. Otelia Limes at 5:03 pmon 4/14/2019by text page via the [facility] messaging system. ASPECTS ([place] Stroke Program Early CT Score) Not scored in the setting of remote MCA territory infarcts. IMPRESSION: 1. No hemorrhage or definite acute infarct. 2. Multifocal remote appearing infarcts in the bilateral MCA, left ACA, and right PCA distributions. Electronically Signed   By: Marnee Spring M.D.   On: 05/06/2017 17:05  Carotid Ultrasound :Right: Severe calcific plaque origin ICA with "thumping" waveform in the CCA and monophasic waveforms noted in the ICA and ECA post plaquing.  Left: 40-59% ICA stenosis, highest end of range.  Significant plaquing noted.  Bilateral vertebral artery flow is antegrade.   2DEcho : Left ventricle: The cavity size was normal. Systolic function was vigorous. The estimated ejection fraction was in the range of 65% to 70%. Wall motion was normal; there were no regional wall motion abnormalities.   Physical Exam:    Pleasant elderly lady currently not in distress. . Afebrile. Head is nontraumatic.  Neck is supple without bruit.    Cardiac exam no murmur or gallop. Lungs are clear to auscultation. Distal pulses are well felt. Neurological Exam :  Awake alert oriented 2. Diminished attention, registration and recall. Slight dysarthria but can be understood. Follows commands. Extraocular moments are full range without nystagmus. Blinks to threat bilaterally. Fundi not visualized. Left lower facial weakness. Tongue midline. Motor system exam no upper or lower extremity drift but weakness of left grip and intrinsic hand muscles. Orbits right over left upper extremity. Lower extremity strength is symmetric 4/5 bilaterally.Sensation appears preserved bilaterally. Gait not tested. ASSESSMENT [name] is a 82 y.o. female with embolic right frontal MCA branch infarct likely secondary to embolization from proximal right  ICA high-grade stenosis versus occlusion., Vascular risk factors of hypertension, hyperlipidemia and carotid disease Hospital day # 0  TREATMENT/PLAN I  recommended dual antiplatelet therapy  With aspirin and Plavix for 3 months given right carotid occlusion. Statin for elevated lipids and strict control of hypertension with blood pressure goal below 130/90 her discharge.Avoid hypotension during the acute hospitalization..Patient has elevated serum creatinine hence we will not do CT angiogram or catheter angiogram to look for string sign. Long discussion with patient and daughter the bedside and answered questions.Discussed with Dr. Benjamine MolaVann.continue ongoing therapy consults Greater than 50% time during this 35 minute visit was spent on counseling and coordination of care  Delia HeadyPramod Edlyn Rosenburg, MD [facility] Pager: [phone] 05/07/2017 4:00 PM

## 2017-05-07 NOTE — NC FL2 (Signed)
[place] MEDICAID [id number] LEVEL OF CARE SCREENING TOOL     IDENTIFICATION  Patient Name: [name] Birthdate: Apr 05, 1935 Sex: female Admission Date (Current Location): 05/06/2017  [facility] and [place] Number:  Producer, television/film/video and Address:  [facility], [address] [place], [place] [address]      Provider Number: [id number]  Attending Physician Name and Address:  Earl Lagos, MD  Relative Name and Phone Number:       Current Level of Care: Hospital Recommended Level of Care: Skilled Nursing Facility Prior Approval Number:    Date Approved/Denied:   PASRR Number: [id number]  Discharge Plan: SNF    Current Diagnoses: Patient Active Problem List   Diagnosis Date Noted  . RUQ abdominal pain 09/11/2016  . Postural dizziness with presyncope 09/11/2016  . RUQ pain 09/11/2016  . Stroke (cerebrum) (HCC) 04/30/2016  . Hyperkalemia 04/30/2016  . Acute encephalopathy 04/30/2016  . Chronic diastolic CHF (congestive heart failure) (HCC) 04/30/2016  . Multiple duodenal ulcers   . RLQ abdominal pain   . Gallstones   . Elevated liver enzymes   . Abnormal finding on GI tract imaging   . Pancreas cyst   . Choledocholithiasis 01/30/2016  . Acute kidney injury (HCC) 01/30/2016  . Non-intractable vomiting with nausea   . Bleeding 11/23/2015  . Chronic kidney disease 05/29/2012  . Skin ulcer, stage 2 (HCC) 05/29/2012  . Internal hemorrhoid, bleeding 05/29/2012  . Severe protein-calorie malnutrition (HCC) 03/24/2012  . Hyponatremia 03/23/2012  . Hypokalemia 03/23/2012  . Urinary tract infection 03/23/2012  . Physical deconditioning 03/23/2012  . CKD (chronic kidney disease) stage 3, GFR 30-59 ml/min (HCC) 12/01/2010  . Encephalopathy 12/01/2010  . CVA (cerebral vascular accident) (HCC) 11/28/2010  . ASTHMA 08/25/2008  . COLONIC POLYPS, ADENOMATOUS, HX OF 08/25/2008  . HLD (hyperlipidemia) 08/17/2008  . Microcytic hypochromic anemia  08/17/2008  . Hypertension 08/17/2008  . History of CVA (cerebrovascular accident) 08/17/2008  . DIVERTICULITIS, COLON 08/17/2008  . HYPERGLYCEMIA 08/17/2008    Orientation RESPIRATION BLADDER Height & Weight     Self, Place  Normal Continent Weight: 119 lb 11.4 oz (54.3 kg) Height:  5\' 6"  (167.6 cm)  BEHAVIORAL SYMPTOMS/MOOD NEUROLOGICAL BOWEL NUTRITION STATUS      Continent Diet(see DC summary)  AMBULATORY STATUS COMMUNICATION OF NEEDS Skin   Extensive Assist Verbally Normal                       Personal Care Assistance Level of Assistance  Bathing, Dressing Bathing Assistance: Maximum assistance   Dressing Assistance: Maximum assistance     Functional Limitations Info             SPECIAL CARE FACTORS FREQUENCY  PT (By licensed PT), OT (By licensed OT), Speech therapy     PT Frequency: 5/wk OT Frequency: 5/wk     Speech Therapy Frequency: 2/wk      Contractures      Additional Factors Info  Code Status, Allergies Code Status Info: FULL Allergies Info: Aspirin, Aspirin           Current Medications (05/07/2017):  This is the current hospital active medication list Current Facility-Administered Medications  Medication Dose Route Frequency Provider Last Rate Last Dose  .  stroke: mapping our early stages of recovery book   Does not apply Once [place], Sarina Ill, MD      . acetaminophen (TYLENOL) tablet 650 mg  650 mg Oral Q4H PRN Sharl Ma, Gagan S,  MD       Or  . acetaminophen (TYLENOL) solution 650 mg  650 mg Per Tube Q4H PRN Meredeth IdeLama, Gagan S, MD       Or  . acetaminophen (TYLENOL) suppository 650 mg  650 mg Rectal Q4H PRN Meredeth IdeLama, Gagan S, MD      . atorvastatin (LIPITOR) tablet 80 mg  80 mg Oral q1800 Gwynn BurlyWallace, Andrew, DO      . clopidogrel (PLAVIX) tablet 75 mg  75 mg Oral Daily Meredeth IdeLama, Gagan S, MD   75 mg at 05/07/17 1301  . enoxaparin (LOVENOX) injection 30 mg  30 mg Subcutaneous Daily Meredeth IdeLama, Gagan S, MD   30 mg at 05/07/17 1301  . senna-docusate (Senokot-S)  tablet 1 tablet  1 tablet Oral QHS PRN Meredeth IdeLama, Gagan S, MD         Discharge Medications: Please see discharge summary for a list of discharge medications.  Relevant Imaging Results:  Relevant Lab Results:   Additional Information SS#: [id number]  Burna SisUris, Oakley Kossman H, LCSW

## 2017-05-07 NOTE — Evaluation (Signed)
Speech Language Pathology Evaluation Patient Details Name: [name] MRN: [id number] DOB: 11-Jun-1935 Today's Date: 05/07/2017 Time: 4098-1191 SLP Time Calculation (min) (ACUTE ONLY): 11 min  Problem List:  Patient Active Problem List   Diagnosis Date Noted  . RUQ abdominal pain 09/11/2016  . Postural dizziness with presyncope 09/11/2016  . RUQ pain 09/11/2016  . Stroke (cerebrum) (HCC) 04/30/2016  . Hyperkalemia 04/30/2016  . Acute encephalopathy 04/30/2016  . Chronic diastolic CHF (congestive heart failure) (HCC) 04/30/2016  . Multiple duodenal ulcers   . RLQ abdominal pain   . Gallstones   . Elevated liver enzymes   . Abnormal finding on GI tract imaging   . Pancreas cyst   . Choledocholithiasis 01/30/2016  . Acute kidney injury (HCC) 01/30/2016  . Non-intractable vomiting with nausea   . Bleeding 11/23/2015  . Chronic kidney disease 05/29/2012  . Skin ulcer, stage 2 (HCC) 05/29/2012  . Internal hemorrhoid, bleeding 05/29/2012  . Severe protein-calorie malnutrition (HCC) 03/24/2012  . Hyponatremia 03/23/2012  . Hypokalemia 03/23/2012  . Urinary tract infection 03/23/2012  . Physical deconditioning 03/23/2012  . CKD (chronic kidney disease) stage 3, GFR 30-59 ml/min (HCC) 12/01/2010  . Encephalopathy 12/01/2010  . CVA (cerebral vascular accident) (HCC) 11/28/2010  . ASTHMA 08/25/2008  . COLONIC POLYPS, ADENOMATOUS, HX OF 08/25/2008  . HLD (hyperlipidemia) 08/17/2008  . Microcytic hypochromic anemia 08/17/2008  . Hypertension 08/17/2008  . History of CVA (cerebrovascular accident) 08/17/2008  . DIVERTICULITIS, COLON 08/17/2008  . HYPERGLYCEMIA 08/17/2008   Past Medical History:  Past Medical History:  Diagnosis Date  . Arthritis    hands  . Asthma   . Chronic diastolic CHF (congestive heart failure) (HCC)   . Chronic kidney disease (CKD), stage III (moderate) (HCC)   . Dementia   . Depression   . Diabetes mellitus without complication (HCC)   .  Diverticulosis   . Hx of adenomatous colonic polyps   . Hyperlipidemia   . Hypertension   . Hypochromic anemia   . Internal hemorrhoids   . Stroke [facility]) May 2009   multiple, last one Nov 2012, 2 strokes with right sided deficits and slurred speech  . Stroke [facility])    Past Surgical History:  Past Surgical History:  Procedure Laterality Date  . ABDOMINAL HYSTERECTOMY    . ABDOMINAL SURGERY    . BOWEL RESECTION    . CHOLECYSTECTOMY N/A 09/15/2016   Procedure: LAPAROSCOPIC CHOLECYSTECTOMY WITH INTRAOPERATIVE CHOLANGIOGRAM;  Surgeon: Ovidio Kin, MD;  Location: [facility] ORS;  Service: General;  Laterality: N/A;  . COLECTOMY    . ESOPHAGOGASTRODUODENOSCOPY N/A 02/01/2016   Procedure: ESOPHAGOGASTRODUODENOSCOPY (EGD);  Surgeon: Beverley Fiedler, MD;  Location: [facility] ENDOSCOPY;  Service: Endoscopy;  Laterality: N/A;  . FLEXIBLE SIGMOIDOSCOPY N/A 05/28/2012   Procedure: FLEXIBLE SIGMOIDOSCOPY;  Surgeon: Hart Carwin, MD;  Location: [facility] ENDOSCOPY;  Service: Endoscopy;  Laterality: N/A;  . OTHER SURGICAL HISTORY     2 ear surgeries  . PERIPHERAL VASCULAR CATHETERIZATION N/A 07/21/2014   Procedure: Abdominal Aortogram w/Lower Extremity;  Surgeon: Nada Libman, MD;  Location: [facility] INVASIVE CV LAB;  Service: Cardiovascular;  Laterality: N/A;  . PERIPHERAL VASCULAR CATHETERIZATION N/A 11/23/2015   Procedure: Abdominal Aortogram w/Lower Extremity;  Surgeon: Nada Libman, MD;  Location: [facility] INVASIVE CV LAB;  Service: Cardiovascular;  Laterality: N/A;  . PERIPHERAL VASCULAR CATHETERIZATION  11/23/2015   Procedure: Peripheral Vascular Intervention;  Surgeon: Nada Libman, MD;  Location: [facility] INVASIVE CV LAB;  Service: Cardiovascular;;  Failed PVI of AT  and Peroneal on left side  . SUBTOTAL COLECTOMY  2010   for stricturing from bouts of diverticulitis along with hx recurrent diverticular bleeds.  the surgery was a total abdominal colec  . TONSILLECTOMY    . VASCULAR SURGERY     HPI:  82 y.o. female, with history  of stroke with right-sided deficits who was brought to hospital with complaints of left-sided weakness, dysarthria, aphasia. MRI showed multiple small acute right MCA and posterior watershed territory nonhemorrhagic infarcts as well as old bilateral PCA, bilateral MCA, and left ACA infarcts. PMHx includes dementia, CHF, HTN, asthma, and DM, Pt last seen by SLP team in 2012 for speech language eval only.                Assessment / Plan / Recommendation Clinical Impression    Pt presents with Mod attention, awareness, complex comprehension, and orientation deficits. Pt followed simple one step commands independently, but required repetition and Mod verbal and visual cues to complete mildly more complex commands. Pt was not oriented to situation despite pt's daughter report that she had told pt of situation immediately before SLP arrival to the room, also suggestive of reduced storage of new information. SLP provided Mod cues for sustained attention during self- feeding. Pt had mildly reduced left attention with right gaze preference, requiring min verbal cues to locate items on her left side. Pt's daughter reports that pt has baseline cognitive, language, and speech deficits with current, acute change only in mental status/cognition since admission. SLP will follow up with intervention to address identified deficits and maximize pt's independence.          SLP Assessment  SLP Recommendation/Assessment: Patient needs continued Speech Lanaguage Pathology Services SLP Visit Diagnosis: Cognitive communication deficit (R41.841)    Follow Up Recommendations  Skilled Nursing facility    Frequency and Duration min 2x/week  2 weeks      SLP Evaluation Cognition  Overall Cognitive Status: Impaired/Different from baseline Arousal/Alertness: Awake/alert Orientation Level: Oriented to person;Oriented to place;Disoriented to situation Attention: Sustained Sustained Attention: Impaired Sustained Attention  Impairment: Functional basic Memory: Impaired Memory Impairment: Storage deficit Awareness: Impaired Awareness Impairment: Emergent impairment Problem Solving: Impaired Problem Solving Impairment: Functional basic Executive Function: Reasoning;Self Correcting Reasoning: Impaired Reasoning Impairment: Verbal basic Self Correcting: Impaired Self Correcting Impairment: Functional basic       Comprehension  Auditory Comprehension Overall Auditory Comprehension: Impaired Yes/No Questions: Not tested Commands: Impaired One Step Basic Commands: 75-100% accurate Complex Commands: 50-74% accurate Conversation: Simple Interfering Components: Attention;Processing speed;Working memory EffectiveTechniques: Extra processing time;Repetition;Slowed speech Visual Recognition/Discrimination Discrimination: Not tested Reading Comprehension Reading Status: Not tested    Expression Expression Primary Mode of Expression: Verbal Verbal Expression Overall Verbal Expression: Impaired at baseline Written Expression Dominant Hand: Left Written Expression: Not tested   Oral / Motor  Oral Motor/Sensory Function Overall Oral Motor/Sensory Function: Mild impairment Facial ROM: Within Functional Limits Facial Symmetry: Within Functional Limits Facial Strength: Within Functional Limits Lingual ROM: Reduced right;Reduced left(pt had reduced lingual movement during exam) Lingual Symmetry: Within Functional Limits Lingual Strength: Reduced Velum: Within Functional Limits Mandible: Within Functional Limits Motor Speech Overall Motor Speech: Impaired at baseline Respiration: Within functional limits Phonation: Normal;Low vocal intensity Resonance: Within functional limits Articulation: Impaired Level of Impairment: Conversation Intelligibility: Intelligibility reduced Word: 50-74% accurate Phrase: 50-74% accurate Sentence: 50-74% accurate Conversation: 50-74% accurate Motor Planning: Not  tested Motor Speech Errors: Unaware   GO  [place] Burdette Gergely SLP Student Clinician  [place] Ireene Ballowe 05/07/2017, 10:08 AM

## 2017-05-07 NOTE — Progress Notes (Addendum)
Subjective:  [name] was admitted overnight by the [facility] service.  [facility] was notified this morning that patient is a [facility] patient and we were asked to resume care today.  Briefly, this is a 82 year old woman with a prior hx of stroke and right sided deficits who was brought to the ED with complaints of left sided weakness, dysarthria, and aphasia.  She was seen by neurology in the ED and a CT head w/o contrast was negative for acute pathology.  She was not given tPA and was admitted for further stroke work up.  MRI of the brain showed multiple small acute RIGHT MCA and posterior watershed territory non-hemorrhagic infarcts as well as old LEFT sided and cerebellar infarcts.  MRA showed small irregular RIGHT ICA proximal severe stenosis, moderate stenosis of LEFT cavernous ICA and a suspected 2mm left cavernous ICA aneurysm.  There was also severe stenosis of the Left M2 and Right P2 segments.   This morning, the patient has just finished working with therapy.  She denies any pain.  Her speech remains dysarthric and she reports that she is hungry.  Objective:  Vital signs in last 24 hours: Vitals:   05/06/17 2330 05/07/17 0130 05/07/17 0330 05/07/17 0530  BP: (!) 140/53 (!) 183/64 (!) 171/55 (!) 186/67  Pulse: 65 62 61 66  Resp: 16 16 16 18   Temp: 97.8 F (36.6 C) 97.9 F (36.6 C) 98 F (36.7 C) 98.2 F (36.8 C)  TempSrc: Oral Oral Axillary Oral  SpO2: 100% 99% 100% 100%  Weight:      Height:       General: Sitting up in chair, NAD HEENT: NCAT, no scleral icterus Cardiac: RRR, no rubs, murmurs or gallops Pulm: clear to auscultation bilaterally, moving normal volumes of air Ext: warm and well perfused, no pedal edema Neuro:  Speech remains dysarthric, she is alert and able to follow commands and answer questions Cranial Nerves: II:  Right gaze preference with left sided visual fields appearing to be peripherally impaired, PERRLA III,IV, VI: ptosis not  present, extra-ocular motions intact bilaterally with hesitation towards the left V,VII: smile symmetric, facial light touch sensation normal bilaterally VIII: hearing grossly normal bilaterally IX,X: soft palette rises symmetrically XI: bilateral shoulder shrug XII: midline tongue extension without atrophy or fasciculations  Able to raise both arms against gravity.  Right biceps and triceps strength 5/5.  Right hand contracture limits grip strength.  Left biceps and triceps strength 3/5 and grip strength 3/5. Sensation is intact to light touch bilaterally with upper and lower extremities. Finger to nose testing was without obvious ataxia but was slowed     Assessment/Plan:  Stroke: Right Sided MCA and posterior watershed territory non-hemorrhagic infarcts seen on MRI in the setting of new left sided deficits. - Continue stroke work up per neurology.  Recommendations appreciated. - A1c = 5.7 - LDL = 145, will start atorvastatin - PT/OT/SLP pending - Allowing for permissive HTN for now.  Begin to normalize BP in the next 24-48 hours - Echo and carotid dopplers pending - Consider CTA head given MRA findings if renal function will allow  - Allergy listed to ASA.  Continue plavix - Monitor on telemetry - Frequent neuro checks  Hypertension:  BP running elevated this morning with last reading of 156/71.  She is on Losartan 25mg  daily at home. - Permissive HTN as above given new stroke  - Normalize BP over the next 24-48 hours  Hyperlipidemia: Fasting lipid  panel this morning with LDL of 145 - Start atorvastatin 80mg  daily  Chronic Kidney Disease Stage 3: Admitted with creatinine elevated from 1.6 in Feb 2019 to 1.8.  She was given gentle IV fluid hydration overnight.  - Continue IVF for now until seen by SLP - BMET in the morning  Chronic HFpEF: Has history of grade 1 diastolic dysfunction from Echo in August 2018 with preserved EF of 60-65%.  Appears to be euvolemic this  morning. - Continue fluids as above with close attention to her fluid status    Dispo: Anticipated discharge in approximately 2-3 day(s).   Gwynn BurlyWallace, Jaxin Fulfer, DO 05/07/2017, 7:50 AM Pager: [phone]

## 2017-05-07 NOTE — Evaluation (Signed)
Physical Therapy Evaluation Patient Details Name: [name] MRN: [id number] DOB: 11-01-1935 Today's Date: 05/07/2017   History of Present Illness   82 y.o. female, with history of stroke with right-sided deficits was brought to hospital with complaints of left-sided weakness, dysarthria, aphasia. MRI multiple small acute right MCA and posterior watershed territory nonhemorrhagic infarcts as well as old bilateral PCA, bilateral MCA, and left ACA infarcts.  Clinical Impression  Patient presents with decreased independence and safety with mobility with new L sided weakness after this CVA.  Previously she was able to ambulate in the home with walker without assist, but assistance for some ADL/IADL's.  Currently she needs mod to max A for standing transfers and is unable to ambulate.  She will benefit from skilled PT in the acute setting and follow up STSNF level rehab at d/c.    Follow Up Recommendations SNF;Supervision/Assistance - 24 hour    Equipment Recommendations  None recommended by PT    Recommendations for Other Services       Precautions / Restrictions Precautions Precautions: Fall Restrictions Weight Bearing Restrictions: No      Mobility  Bed Mobility Overal bed mobility: Needs Assistance Bed Mobility: Supine to Sit     Supine to sit: Min assist     General bed mobility comments: assist for scooting to EOB, increased time needed  Transfers Overall transfer level: Needs assistance Equipment used: Rolling walker (2 wheeled) Transfers: Sit to/from [employer] Sit to Stand: Max assist Stand pivot transfers: Mod assist       General transfer comment: lifting and lowering help from EOB and assist for stand step to/from [facility] due to soiled with urine in bed, mod A and increased time with cues for stepping to chair with RW due to L LE difficulty moving.  Ambulation/Gait                Stairs            Wheelchair Mobility     Modified Rankin (Stroke Patients Only) Modified Rankin (Stroke Patients Only) Pre-Morbid Rankin Score: Moderate disability Modified Rankin: Severe disability     Balance Overall balance assessment: Needs assistance Sitting-balance support: Feet supported;No upper extremity supported Sitting balance-Leahy Scale: Fair         Standing balance comment: Pt requiring one UE support and physical A                             Pertinent Vitals/Pain Pain Assessment: No/denies pain    Home Living Family/patient expects to be discharged to:: Skilled nursing facility Living Arrangements: Children Available Help at Discharge: Family;Available PRN/intermittently;Other (Comment) Type of Home: Apartment Home Access: Level entry   Entrance Stairs-Number of Steps: 1 Home Layout: One level Home Equipment: Walker - 2 wheels;Shower seat;Grab bars - tub/shower;Hand held shower head Additional Comments: goes to [employer], Thurs, Fri. 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 Assistance Needed: RW for functional mobility  ADL's / Homemaking Assistance Needed: Recently, daughter helping with dressing and bathing as needed due to limited functional use of LUE (dominant hand)  Comments: Mon and tue two hours aide     Hand Dominance   Dominant Hand: Left    Extremity/Trunk Assessment   Upper Extremity Assessment Upper Extremity Assessment: Defer to OT evaluation    Lower Extremity Assessment Lower Extremity Assessment: RLE deficits/detail;LLE deficits/detail RLE Deficits / Details:  AROM and strength grossly WFL, but increased tone with flexion noted with cogwheeling LLE Deficits / Details: AROM and strength grossly WFL, but increased tone with flexion noted with cogwheeling(difficult to assess sensation due to cognition, but pt does report difference in sensation L to R)       Communication   Communication: Expressive  difficulties  Cognition Arousal/Alertness: Awake/alert Behavior During Therapy: WFL for tasks assessed/performed Overall Cognitive Status: Impaired/Different from baseline Area of Impairment: Memory;Orientation                 Orientation Level: Disoriented to;Time(knows month as her birthday is tomorrow)   Memory: Decreased short-term memory                General Comments General comments (skin integrity, edema, etc.): Daughter presents and agrees with SNF prior to d/c home    Exercises     Assessment/Plan    PT Assessment Patient needs continued PT services  PT Problem List Decreased balance;Decreased knowledge of use of DME;Decreased cognition;Decreased mobility;Decreased strength;Decreased knowledge of precautions;Decreased coordination       PT Treatment Interventions DME instruction;Therapeutic activities;Therapeutic exercise;Gait training;Balance training;Functional mobility training    PT Goals (Current goals can be found in the Care Plan section)       Frequency Min 3X/week   Barriers to discharge        Co-evaluation               AM-PAC PT "6 Clicks" Daily Activity  Outcome Measure Difficulty turning over in bed (including adjusting bedclothes, sheets and blankets)?: Unable Difficulty moving from lying on back to sitting on the side of the bed? : Unable Difficulty sitting down on and standing up from a chair with arms (e.g., wheelchair, bedside commode, etc,.)?: Unable Help needed moving to and from a bed to chair (including a wheelchair)?: A Lot Help needed walking in hospital room?: Total Help needed climbing 3-5 steps with a railing? : Total 6 Click Score: 7    End of Session Equipment Utilized During Treatment: Gait belt Activity Tolerance: Patient tolerated treatment well Patient left: in chair;with call bell/phone within reach;with chair alarm set;with family/visitor present   PT Visit Diagnosis: Unsteadiness on feet  (R26.81);Other abnormalities of gait and mobility (R26.89);Hemiplegia and hemiparesis;Other symptoms and signs involving the nervous system (R29.898) Hemiplegia - Right/Left: Left Hemiplegia - dominant/non-dominant: Non-dominant Hemiplegia - caused by: Cerebral infarction    Time: 1140-1210 PT Time Calculation (min) (ACUTE ONLY): 30 min   Charges:   PT Evaluation $PT Eval Moderate Complexity: 1 Mod PT Treatments $Therapeutic Activity: 8-22 mins   PT G CodesSheran Lawless:        Cyndi Gustavia Carie, [place] [phone] 05/07/2017   Elray Mcgregorynthia Myosha Cuadras 05/07/2017, 3:47 PM

## 2017-05-07 NOTE — Plan of Care (Signed)
progressing 

## 2017-05-07 NOTE — Evaluation (Signed)
Clinical/Bedside Swallow Evaluation Patient Details  Name: [name] MRN: [id number] Date of Birth: [birth year]  Today's Date: 05/07/2017 Time: SLP Start Time (ACUTE ONLY): [id number] SLP Stop Time (ACUTE ONLY): 0941 SLP Time Calculation (min) (ACUTE ONLY): 14 min  Past Medical History:  Past Medical History:  Diagnosis Date  . Arthritis    hands  . Asthma   . Chronic diastolic CHF (congestive heart failure) (HCC)   . Chronic kidney disease (CKD), stage III (moderate) (HCC)   . Dementia   . Depression   . Diabetes mellitus without complication (HCC)   . Diverticulosis   . Hx of adenomatous colonic polyps   . Hyperlipidemia   . Hypertension   . Hypochromic anemia   . Internal hemorrhoids   . Stroke [facility](HCC) May 2009   multiple, last one Nov 2012, 2 strokes with right sided deficits and slurred speech  . Stroke [facility](HCC)    Past Surgical History:  Past Surgical History:  Procedure Laterality Date  . ABDOMINAL HYSTERECTOMY    . ABDOMINAL SURGERY    . BOWEL RESECTION    . CHOLECYSTECTOMY N/A 09/15/2016   Procedure: LAPAROSCOPIC CHOLECYSTECTOMY WITH INTRAOPERATIVE CHOLANGIOGRAM;  Surgeon: [name], [name], MD;  Location: [facility] ORS;  Service: General;  Laterality: N/A;  . COLECTOMY    . ESOPHAGOGASTRODUODENOSCOPY N/A 02/01/2016   Procedure: ESOPHAGOGASTRODUODENOSCOPY (EGD);  Surgeon: [name], MD;  [facility];  Service: Endoscopy;  Laterality: N/A;  . FLEXIBLE SIGMOIDOSCOPY N/A 05/28/2012   Procedure: FLEXIBLE SIGMOIDOSCOPY;  Surgeon: [name], MD;  [facility];  Service: Endoscopy;  Laterality: N/A;  . OTHER SURGICAL HISTORY     2 ear surgeries  . PERIPHERAL VASCULAR CATHETERIZATION N/A 07/21/2014   Procedure: Abdominal Aortogram w/Lower Extremity;  Surgeon: [name], MD;  Location: [facility] INVASIVE CV LAB;  Service: Cardiovascular;  Laterality: N/A;  . PERIPHERAL VASCULAR CATHETERIZATION N/A 11/23/2015   Procedure: Abdominal Aortogram w/Lower Extremity;   Surgeon: [name], MD;  Location: [facility] INVASIVE CV LAB;  Service: Cardiovascular;  Laterality: N/A;  . PERIPHERAL VASCULAR CATHETERIZATION  11/23/2015   Procedure: Peripheral Vascular Intervention;  Surgeon: [name], MD;  Location: [facility] INVASIVE CV LAB;  Service: Cardiovascular;;  Failed PVI of AT and Peroneal on left side  . SUBTOTAL COLECTOMY  2010   for stricturing from bouts of diverticulitis along with hx recurrent diverticular bleeds.  the surgery was a total abdominal colec  . TONSILLECTOMY    . VASCULAR SURGERY     HPI:  82 y.o. female, with history of stroke with right-sided deficits who was brought to hospital with complaints of left-sided weakness, dysarthria, aphasia. MRI showed multiple small acute right MCA and posterior watershed territory nonhemorrhagic infarcts as well as old bilateral PCA, bilateral MCA, and left ACA infarcts. PMHx includes dementia, CHF, HTN, asthma, and DM, Pt last seen by SLP team in 2012 for speech language eval only.                Assessment / Plan / Recommendation Clinical Impression    Pt presents with initial, intermittent coughing with thin liquid; however, with continued challenging with large cup and straw sips, pt demonstrated no further signs concerning for aspiration. Pt had Min oral residue, impaired mastication, and delayed oral transit with regular solid trials, benefiting from Mod assist to ensure aspiration precautions and adequate oral clearance. Recommend initiating Dys 2 (chopped) diet and thin liquids with aspiration precautions (reduce distractions, slow/small bites, and sitting upright for oral  intake). Recommend full supervision/assist with meals, pausing intake with increased coughing, and medications administered in puree. SLP will continue to follow to ensure safety with current diet and compliance with aspiration precautions.     SLP Visit Diagnosis: Dysphagia, unspecified (R13.10)    Aspiration Risk  Mild aspiration risk     Diet Recommendation Dysphagia 2 (Fine chop);Thin liquid   Liquid Administration via: Straw;Cup Medication Administration: Whole meds with puree Supervision: Staff to assist with self feeding;Full supervision/cueing for compensatory strategies Compensations: Minimize environmental distractions;Slow rate;Small sips/bites Postural Changes: Seated upright at 90 degrees    Other  Recommendations Oral Care Recommendations: Oral care BID   Follow up Recommendations Skilled Nursing facility      Frequency and Duration min 2x/week  2 weeks       Prognosis Prognosis for Safe Diet Advancement: Good Barriers to Reach Goals: Cognitive deficits      Swallow Study   General HPI: 82 y.o. female, with history of stroke with right-sided deficits who was brought to hospital with complaints of left-sided weakness, dysarthria, aphasia. MRI showed multiple small acute right MCA and posterior watershed territory nonhemorrhagic infarcts as well as old bilateral PCA, bilateral MCA, and left ACA infarcts. PMHx includes dementia, CHF, HTN, asthma, and DM, Pt last seen by SLP team in 2012 for speech language eval only.              Type of Study: Bedside Swallow Evaluation Previous Swallow Assessment: none in chart Diet Prior to this Study: NPO Temperature Spikes Noted: No Respiratory Status: Room air History of Recent Intubation: No Behavior/Cognition: Alert;Cooperative;Pleasant mood Oral Cavity Assessment: Within Functional Limits Oral Care Completed by SLP: Other (Comment)(recent completition by pt's daughter) Oral Cavity - Dentition: Missing dentition;Dentures, top(no dentures bottom) Vision: Impaired for self-feeding Self-Feeding Abilities: Needs assist Patient Positioning: Upright in chair Baseline Vocal Quality: Normal;Low vocal intensity    Oral/Motor/Sensory Function Overall Oral Motor/Sensory Function: Mild impairment Facial ROM: Within Functional Limits Facial Symmetry: Within  Functional Limits Facial Strength: Within Functional Limits Lingual ROM: Reduced right;Reduced left Lingual Symmetry: Within Functional Limits Lingual Strength: Reduced Velum: Within Functional Limits Mandible: Within Functional Limits   Ice Chips Ice chips: Impaired Presentation: Spoon Oral Phase Functional Implications: Prolonged oral transit Pharyngeal Phase Impairments: Cough - Delayed   Thin Liquid Thin Liquid: Impaired Presentation: Cup;Straw Pharyngeal  Phase Impairments: Cough - Immediate;Cough - Delayed    Nectar Thick Nectar Thick Liquid: Not tested   Honey Thick Honey Thick Liquid: Not tested   Puree Puree: Impaired Presentation: Spoon Oral Phase Functional Implications: Oral residue(min residue)   Solid   GO   Solid: Impaired Presentation: (assist with feeding ) Oral Phase Impairments: Impaired mastication Oral Phase Functional Implications: Oral residue(min residue)       [name] SLP Student Clinician  [name] 05/07/2017,10:15 AM

## 2017-05-07 NOTE — Care Management Note (Signed)
Case Management Note  Patient Details  Name: Emma NakayamaSarah M Tyler MRN: [id number] Date of Birth: 1935-09-12  Subjective/Objective:     Pt admitted with CVA. She is from home with her daughter and is active with [facility].                Action/Plan: PT/OT recommending SNF. CSW aware. CM following for d/c disposition.  Expected Discharge Date:                  Expected Discharge Plan:  Skilled Nursing Facility  In-House Referral:  Clinical Social Work  Discharge planning Services     Post Acute Care Choice:    Choice offered to:     DME Arranged:    DME Agency:     HH Arranged:    HH Agency:     Status of Service:  In process, will continue to follow  If discussed at Long Length of Stay Meetings, dates discussed:    Additional Comments:  Kermit BaloKelli F Elianny Buxbaum, RN 05/07/2017, 4:13 PM

## 2017-05-07 NOTE — Progress Notes (Signed)
VASCULAR LAB PRELIMINARY  PRELIMINARY  PRELIMINARY  PRELIMINARY  Carotid duplex completed.    Preliminary report:  Right: Severe calcific plaque origin ICA with "thumping" waveform in the CCA and monophasic waveforms noted in the ICA and ECA post plaquing.  Left: 40-59% ICA stenosis, highest end of range.  Significant plaquing noted.  Bilateral vertebral artery flow is antegrade.   Mirza Fessel, RVT 05/07/2017, 10:37 AM

## 2017-05-07 NOTE — Evaluation (Signed)
Occupational Therapy Evaluation Patient Details Name: [name] MRN: [id number] DOB: [birth year] Today's Date: 05/07/2017    History of Present Illness  82 y.o. female, with history of stroke with right-sided deficits was brought to hospital with complaints of left-sided weakness, dysarthria, aphasia. MRI multiple small acute right MCA and posterior watershed territory nonhemorrhagic infarcts as well as old bilateral PCA, bilateral MCA, and left ACA infarcts.   Clinical Impression   PTA, pt living with her daughter and was performing BADLs including grooming, bathing, and dressing. Pt using rolling walker for functional mobility. Pt with aide who assists as needed Mon-Tues and goes to [facility] Wed-Fri. Pt currently requiring Mod A for UB ADLs, Max A for LB ADLs, and Max A for functional transfers. Pt presenting with poor functional use of LUE (dominant hand), decreased sitting/standing balance, visual deficits with preference for right visual field, dysarthria, and decreased activity tolerance. Pt would benefit from further acute OT to facilitate safe dc. Recommend dc to SNF for further OT to optimize safety, independence with ADLs, and return to PLOF.      Follow Up Recommendations  SNF;Supervision/Assistance - 24 hour    Equipment Recommendations  Other (comment)(Defer to next venue)    Recommendations for Other Services Rehab consult;PT consult;Speech consult     Precautions / Restrictions Precautions Precautions: Fall Restrictions Weight Bearing Restrictions: No      Mobility Bed Mobility Overal bed mobility: Needs Assistance Bed Mobility: Supine to Sit     Supine to sit: Mod assist     General bed mobility comments: Pt able to bring BLEs towards EOB and then elevate trunk with MIn A. Requiring Mod A to bring hips towards EOB with bedpad due to fatigue and poor activity tolerance.   Transfers Overall transfer level: Needs assistance Equipment used: Rolling walker (2  wheeled) Transfers: Sit to/from Stand Sit to Stand: Mod assist;Max assist         General transfer comment: First attempt from EOB, pt requiring Mod A to power up into standing but then transitioning to Max A for posterior lean and poor balance. Second attempt, pt requiring Max A to power into standing.    Balance Overall balance assessment: Needs assistance Sitting-balance support: Feet supported;No upper extremity supported Sitting balance-Leahy Scale: Fair Sitting balance - Comments: Able to maintain static sitting at EOB. During funcitonal task and testing, pt with posterior lean and requiring cues to correct posture. Postural control: Posterior lean Standing balance support: Bilateral upper extremity supported;During functional activity Standing balance-Leahy Scale: Poor Standing balance comment: Pt requiring on UE support and physical A                           ADL either performed or assessed with clinical judgement   ADL Overall ADL's : Needs assistance/impaired Eating/Feeding: Set up;Sitting   Grooming: Minimal assistance;Sitting   Upper Body Bathing: Sitting;Moderate assistance   Lower Body Bathing: Maximal assistance;Sit to/from stand   Upper Body Dressing : Sitting;Moderate assistance   Lower Body Dressing: Maximal assistance;Sit to/from stand Lower Body Dressing Details (indicate cue type and reason): Pt attempting to adjust socks by crossing legs and then reaching down with LUE. Pt unable to maintain grasp on sock and poor finger dexerity to grab top of sock. Pt fatiguing quickly. Requiring Max hand over hand to pull up sock. Pt also requiring Max A for standing balance with posterior lean. Toilet Transfer: Maximal assistance;Stand-pivot;RW(Simulated to recliner; pivot to left)  Functional mobility during ADLs: Maximal assistance;Rolling walker(Stand pivot only) General ADL Comments: Pt with poor sitting/standing balance, decreased  functional use of LUE, poor activity tolerance.      Vision Baseline Vision/History: Wears glasses Wears Glasses: Reading only Patient Visual Report: No change from baseline Vision Assessment?: Vision impaired- to be further tested in functional context;Yes Eye Alignment: Within Functional Limits Alignment/Gaze Preference: Other (comment)(Decreased head turn and tracking to left) Tracking/Visual Pursuits: Requires cues, head turns, or add eye shifts to track;Decreased smoothness of horizontal tracking;Unable to hold eye position out of midline;Impaired - to be further tested in functional context Visual Fields: Impaired-to be further tested in functional context Depth Perception: Undershoots Additional Comments: Pt with difficulty tracking without head turns. Unable to initate tracking to left. Pt inattention to left visual field. Upon OT arrival, waking pt while standing on left side of bed. Pt turning head to right and attempting to look for OT on right visual field despite both verbal and tactile cues that OT was on left side. Will continue to assess vision     Perception     Praxis      Pertinent Vitals/Pain Pain Assessment: No/denies pain     Hand Dominance Left   Extremity/Trunk Assessment Upper Extremity Assessment Upper Extremity Assessment: RUE deficits/detail;LUE deficits/detail RUE Deficits / Details: Prior CVA. Contractures at third-fifth digit. Able to perform AROM of first and second digit. Able to perform elbow and shoulder ROM with compensatory shoulder hiking RUE Coordination: decreased fine motor;decreased gross motor LUE Deficits / Details: Pt with decreased functional use of LUE (dominant hand). Pt able to perform finger flexion and extenion with increased effort. Difficulty isolating digits. Able to bring hand to mouth and top of head for feeding and grooming. Requring increased time and effort for LUE movement. LUE Coordination: decreased fine motor;decreased  gross motor   Lower Extremity Assessment Lower Extremity Assessment: Defer to PT evaluation   Cervical / Trunk Assessment Cervical / Trunk Assessment: Other exceptions Cervical / Trunk Exceptions: Forward flexion   Communication Communication Communication: Expressive difficulties   Cognition Arousal/Alertness: Awake/alert Behavior During Therapy: WFL for tasks assessed/performed Overall Cognitive Status: Difficult to assess                                 General Comments: Pt able to answer PLOF and home questions. Daughter confirming all information. Pt requiring increased time thorughout session.    General Comments  Daughter present throughout session. Daughter in agreement that SNF for rehab would be benefitial before transitioning home    Exercises     Shoulder Instructions      Home Living Family/patient expects to be discharged to:: Private residence Living Arrangements: Children Available Help at Discharge: Family;Available PRN/intermittently;Other (Comment) Type of Home: Apartment Home Access: Level entry Entrance Stairs-Number of Steps: 1   Home Layout: One level     Bathroom Shower/Tub: Tub/shower unit;Curtain   FirefighterBathroom Toilet: Standard     Home Equipment: Environmental consultantWalker - 2 wheels;Shower seat;Grab bars - tub/shower;Hand held shower head   Additional Comments[facility]. Daughter works      Prior Functioning/Environment Level of Independence: Needs Financial plannerassistance  Gait / Transfers Assistance Needed: RW for functional mobility ADL's / Homemaking Assistance Needed: Recently, daughter helping with dressing and bathing as needed due to limited functional use of LUE (dominant hand)   Comments: Mon and tue two hours aide  OT Problem List: Decreased strength;Decreased range of motion;Decreased activity tolerance;Impaired balance (sitting and/or standing);Impaired vision/perception;Decreased coordination;Decreased  cognition;Decreased safety awareness;Decreased knowledge of use of DME or AE;Decreased knowledge of precautions;Impaired UE functional use      OT Treatment/Interventions: Self-care/ADL training;Therapeutic exercise;Energy conservation;DME and/or AE instruction;Therapeutic activities;Patient/family education    OT Goals(Current goals can be found in the care plan section) Acute Rehab OT Goals Patient Stated Goal: Get better OT Goal Formulation: With patient/family Time For Goal Achievement: 05/21/17 Potential to Achieve Goals: Good ADL Goals Pt Will Perform Grooming: with set-up;with supervision;sitting Pt Will Perform Upper Body Dressing: with set-up;with supervision;sitting Pt Will Perform Lower Body Dressing: with min guard assist;sit to/from stand Pt Will Transfer to Toilet: with min guard assist;bedside commode;stand pivot transfer Pt Will Perform Toileting - Clothing Manipulation and hygiene: with min guard assist;sit to/from stand  OT Frequency: Min 2X/week   Barriers to D/C:            Co-evaluation              AM-PAC PT "6 Clicks" Daily Activity     Outcome Measure Help from another person eating meals?: A Little Help from another person taking care of personal grooming?: A Little Help from another person toileting, which includes using toliet, bedpan, or urinal?: A Lot Help from another person bathing (including washing, rinsing, drying)?: A Lot Help from another person to put on and taking off regular upper body clothing?: A Lot Help from another person to put on and taking off regular lower body clothing?: A Lot 6 Click Score: 14   End of Session Equipment Utilized During Treatment: Gait belt;Rolling walker Nurse Communication: Mobility status  Activity Tolerance: Patient tolerated treatment well;Patient limited by fatigue Patient left: in chair;with call bell/phone within reach;with chair alarm set;with family/visitor present;Other (comment)(MD present)  OT  Visit Diagnosis: Unsteadiness on feet (R26.81);Other abnormalities of gait and mobility (R26.89);Muscle weakness (generalized) (M62.81);Other symptoms and signs involving cognitive function;Hemiplegia and hemiparesis Hemiplegia - Right/Left: Left Hemiplegia - dominant/non-dominant: Dominant Hemiplegia - caused by: Cerebral infarction                Time: 0732-0804 OT Time Calculation (min): 32 min Charges:  OT General Charges $OT Visit: 1 Visit OT Evaluation $OT Eval Moderate Complexity: 1 Mod OT Treatments $Self Care/Home Management : 8-22 mins G-Codes:     Aaisha Sliter MSOT, OTR/L Acute Rehab Pager: [phone] Office: [phone]  Theodoro Grist Lamount Bankson 05/07/2017, 9:08 AM

## 2017-05-07 NOTE — Progress Notes (Signed)
  Echocardiogram 2D Echocardiogram has been performed.  Roosvelt MaserLane, Teghan Philbin F 05/07/2017, 11:24 AM

## 2017-05-08 DIAGNOSIS — I129 Hypertensive chronic kidney disease with stage 1 through stage 4 chronic kidney disease, or unspecified chronic kidney disease: Secondary | ICD-10-CM

## 2017-05-08 DIAGNOSIS — G8194 Hemiplegia, unspecified affecting left nondominant side: Secondary | ICD-10-CM

## 2017-05-08 DIAGNOSIS — I639 Cerebral infarction, unspecified: Secondary | ICD-10-CM

## 2017-05-08 DIAGNOSIS — R471 Dysarthria and anarthria: Secondary | ICD-10-CM

## 2017-05-08 DIAGNOSIS — N183 Chronic kidney disease, stage 3 (moderate): Secondary | ICD-10-CM

## 2017-05-08 DIAGNOSIS — Z886 Allergy status to analgesic agent status: Secondary | ICD-10-CM

## 2017-05-08 DIAGNOSIS — I6300 Cerebral infarction due to thrombosis of unspecified precerebral artery: Secondary | ICD-10-CM

## 2017-05-08 DIAGNOSIS — E785 Hyperlipidemia, unspecified: Secondary | ICD-10-CM

## 2017-05-08 LAB — BASIC METABOLIC PANEL
Anion gap: 8 (ref 5–15)
BUN: 21 mg/dL — AB (ref 6–20)
CO2: 23 mmol/L (ref 22–32)
Calcium: 8.8 mg/dL — ABNORMAL LOW (ref 8.9–10.3)
Chloride: 107 mmol/L (ref 101–111)
Creatinine, Ser: 1.58 mg/dL — ABNORMAL HIGH (ref 0.44–1.00)
GFR calc Af Amer: 35 mL/min — ABNORMAL LOW (ref >60)
GFR calc non Af Amer: 30 mL/min — ABNORMAL LOW (ref >60)
GLUCOSE: 74 mg/dL (ref 65–99)
POTASSIUM: 4.1 mmol/L (ref 3.5–5.1)
Sodium: 138 mmol/L (ref 135–145)

## 2017-05-08 MED ORDER — ATORVASTATIN CALCIUM 80 MG PO TABS: 80.0000 mg | ORAL_TABLET | Freq: Every day | ORAL | 0 refills | Status: AC

## 2017-05-08 MED ORDER — ASPIRIN 81 MG PO TBEC: 81.0000 mg | DELAYED_RELEASE_TABLET | Freq: Every day | ORAL | 2 refills | Status: DC

## 2017-05-08 NOTE — Clinical Social Work Placement (Signed)
   CLINICAL SOCIAL WORK PLACEMENT  NOTE  Date:  05/08/2017  Patient Details  Name: Emma NakayamaSarah M Duprey MRN: [id number] Date of Birth: 04-Apr-1935  Clinical Social Work is seeking post-discharge placement for this patient at the Skilled  Nursing Facility level of care (*CSW will initial, date and re-position this form in  chart as items are completed):  Yes   Patient/family provided with [place] Clinical Social Work Department's list of facilities offering this level of care within the geographic area requested by the patient (or if unable, by the patient's family).  Yes   Patient/family informed of their freedom to choose among providers that offer the needed level of care, that participate in Medicare, Medicaid or managed care program needed by the patient, have an available bed and are willing to accept the patient.  Yes   Patient/family informed of [place] [facility]'s ownership interest in [facility] and [facility], as well as of the fact that they are under no obligation to receive care at these facilities.  PASRR submitted to EDS on       PASRR number received on       Existing PASRR number confirmed on 05/07/17     FL2 transmitted to all facilities in geographic area requested by pt/family on 05/07/17     FL2 transmitted to all facilities within larger geographic area on       Patient informed that his/her managed care company has contracts with or will negotiate with certain facilities, including the following:        Yes   Patient/family informed of bed offers received.  [facility]     Physician recommends and patient chooses bed at      [facility] on 05/08/17.  Patient to be transferred to facility by ptar     Patient family notified on 05/08/17 of transfer.  Name of family member notified:  denise     PHYSICIAN Please sign FL2     Additional Comment:     _______________________________________________ Burna SisUris, Roald Lukacs H, LCSW 05/08/2017, 1:52 PM

## 2017-05-08 NOTE — Progress Notes (Signed)
  Speech Language Pathology Treatment: Dysphagia;Cognitive-Linquistic  Patient Details Name: Emma NakayamaSarah M Gassner MRN: [id number] DOB: [birth year] Today's Date: 05/08/2017 Time: [id number] SLP Time Calculation (min) (ACUTE ONLY): 30 min  Assessment / Plan / Recommendation Clinical Impression  Pt was seen for skilled ST targeting goals for cognition and dysphagia.  Pt was oriented to place, situation, and date with min question cues.  When her lunch meal arrived, she requested to sit at the edge of bed for her meal and for therapist to feed her given her decreased fine motor control for self feeding.  Pt directed her care by telling therapist which food and drink items she wanted administered at any given time.  Therapist provided supervision verbal cues for use of liquid wash to clear mild oral residue of solids post swallow.  Pt had audible swallows with cup sips of liquids but no other overt s/s of aspiration.  Pt sustained her attention to her meal for ~20 minutes with supervision cues for redirection.  Pt made eye contact appropriately when therapist was seated directly to her left.  Pt was handed off to nurse tech to finish lunch meal.  Continue per current plan of care.    HPI HPI: 82 y.o. female, with history of stroke with right-sided deficits who was brought to hospital with complaints of left-sided weakness, dysarthria, aphasia. MRI showed multiple small acute right MCA and posterior watershed territory nonhemorrhagic infarcts as well as old bilateral PCA, bilateral MCA, and left ACA infarcts. PMHx includes dementia, CHF, HTN, asthma, and DM, Pt last seen by SLP team in 2012 for speech language eval only.                   SLP Plan  Continue with current plan of care       Recommendations  Diet recommendations: Dysphagia 2 (fine chop);Thin liquid Liquids provided via: Cup Medication Administration: Whole meds with puree Supervision: Staff to assist with self feeding Compensations: Minimize  environmental distractions;Slow rate;Small sips/bites Postural Changes and/or Swallow Maneuvers: Out of bed for meals;Seated upright 90 degrees                Oral Care Recommendations: Oral care BID Follow up Recommendations: Skilled Nursing facility SLP Visit Diagnosis: Dysphagia, unspecified (R13.10);Cognitive communication deficit (R41.841) Plan: Continue with current plan of care       GO                Tyneka Scafidi, Melanee Spryicole L 05/08/2017, 1:12 PM

## 2017-05-08 NOTE — Progress Notes (Signed)
   Subjective: No acute events overnight, she is pleasant today with no complaints, just finished breakfast. Worked with PT yesterday. Today [birth year] her birthday  Objective:  Vital signs in last 24 hours: Vitals:   05/07/17 2043 05/08/17 0025 05/08/17 0426 05/08/17 0749  BP: (!) 154/115 (!) 167/63 (!) 129/50 (!) 162/63  Pulse: 71 68 (!) 58 62  Resp: 20 18 20 16   Temp: 97.7 F (36.5 C) (!) 97.5 F (36.4 C) 97.7 F (36.5 C)   TempSrc: Oral Oral Oral   SpO2: 100% 100% 100% 100%  Weight:      Height:       General: Pleasant elderly female resting in bed comfortably, no acute distress CV: RRR Resp: Clear anterior breath sounds, normal work of breathing, no distress  Abd: Soft, +BS, nontender Extr: No LE edema. Chronic arthritic changes to hand, unable to adequately assess grip.  Neuro: Alert and oriented x3, dysarthric speech without aphasia, Asymmetric upper extremity strength with L 3-4/5  Skin: Warm, dry      Assessment/Plan:  Acute R CVA to MCA, Posterior Watershed Infarcts Prior history of CVA, presented with new L sided deficits and found to have R MCA and posterior watershed territory infarcts on MRI. MRA notes evidence of vascular disease of carotids and intracranial vessels. Carotid US with similar findings of R carotid disease. Further eval with contrast studies limited d/t renal function. A1c 5.7, LDL 145. Neurology consulted, appreciate recommendations. PT/OT recommend SNF. Would benefit from asa/plavix though pt allergic to asa. Will touch base with Neuro regarding alternative regimen or plavix alone. --F/u Neurology recommendations --Cont Plavix 75 mg daily --Cont high intensity statin --Permissive HTN   --SW for SNF placement  --Cont PT/OT   CKD III Baseline appears to be ~1.6 based on prior labs. Currently c/w baseline at 1.58. Continue to monitor and avoid nephrotoxins.   H/o HTN Outpatient regimen of Losartan 25 mg daily. Holding during admission for permissive  HTN, will resume on discharge with more aggressive goal as outpatient.   Dispo: Anticipated discharge pending SNF placement   Ginger CarneHarden, Marrianne Sica, MD 05/08/2017, 11:12 AM Pager: [phone]

## 2017-05-08 NOTE — Discharge Summary (Addendum)
Name: [name] MRN: [id number] DOB: 1935-07-07 82 y.o. PCP: Emma Bastos, MD  Date of Admission: 05/06/2017  4:41 PM Date of Discharge: 05/08/2017 Attending Physician: Emma Lagos, MD  Discharge Diagnosis: Principal Problem:   CVA (cerebral vascular accident) [facility]) Active Problems:   HLD (hyperlipidemia)   Hypertension   History of CVA (cerebrovascular accident)   CKD (chronic kidney disease) stage 3, GFR 30-59 ml/min (HCC)   Stroke [facility])   Discharge Medications: Allergies as of 05/08/2017      Reactions   Aspirin Nausea Only   Aspirin Nausea And Vomiting   Hurts stomach      Medication List    TAKE these medications   aspirin 81 MG EC tablet Take 1 tablet (81 mg total) by mouth daily. Swallow whole.   atorvastatin 80 MG tablet Commonly known as:  LIPITOR Take 1 tablet (80 mg total) by mouth daily at 6 PM.   clopidogrel 75 MG tablet Commonly known as:  PLAVIX Take 1 tablet (75 mg total) by mouth daily. Hold until next week, restart on Monday 8/27 What changed:  additional instructions   loratadine 10 MG tablet Commonly known as:  CLARITIN Take 10 mg by mouth daily.   losartan 25 MG tablet Commonly known as:  COZAAR Take 1 tablet (25 mg total) by mouth daily.   ranitidine 75 MG tablet Commonly known as:  ZANTAC Take 75 mg by mouth at bedtime.   traMADol 50 MG tablet Commonly known as:  ULTRAM Take 1 tablet (50 mg total) by mouth every 12 (twelve) hours as needed for moderate pain.   Vitamin D3 1000 units Caps Take 1,000 Units by mouth daily.       Disposition and follow-up:   [name] was discharged from [facility] in Stable condition.  At the hospital follow up visit please address:  1.  -Continue working with therapy to improve functional status after CVA with L sided deficits. Increase safety to decrease chance of falls -Continue dual anti-platelet therapy with aspirin and plavix for 3 months then  plavix alone -Assess BP and consider lower goal than typical for her age range for risk reduction of recurrent stroke  -Assess statin tolerance   2.  Labs / imaging needed at time of follow-up: None  3.  Pending labs/ test needing follow-up: None   Follow-up Appointments:   Hospital Course by problem list:   Acute CVA to R MCA, Posterior Watershed Infarcts Pt with prior history of CVA (w/ residual R side deficits) presented with new L sided weakness (3-4/5 strength) and dysarthria and found to have acute CVA to R MCA territory, as well as posterior watershed territory infarcts on MRI. She did not receive tPA as risk of hemorrhage were felt to outweigh benefits. Plavix was continued, started on statin, and allowed for permissive HTN in acute period. Further imaging with MRA and Carotid US revealed high grade stenosis of R ICA, as well as intracranial vascular disease. Source of thrombus was suspected to be from R carotid disease. Imaging/interventions involving contrast not pursued due to pt renal function. PT/OT/SLP evaluations resulted in recommendations for dysphagia 2 diet, and SNF placement for ongoing rehab. Patient has an aspirin allergy of stomach upset.  She was discharged with aspirin (enteric coated) and plavix for 3 months then plavix alone.    CKD III Baseline creatinine appears to be ~1.6 based on prior labs. Her Cr and GFR were within her baseline during admission and  she received gentle IVF for period of NPO status while awaiting swallowing evaluation.   H/o HTN She was on an outpatient regimen consisting of Losartan 25 mg daily. This was held during admission for permissive HTN in the setting of acute stroke. This was resumed on discharge (after 24-48 hrs). If tolerated, her blood pressure regimen may be adjusted for a lower goal for further risk reduction of recurrent stroke.   H/o HLD Lipid panel checked as part of stroke workup, LDL 145, total chol 226. Pt started on  Atorvastatin 80 mg for risk reduction.   Discharge Vitals:   BP (!) 177/66 (BP Location: Left Arm)   Pulse 69   Temp 97.7 F (36.5 C) (Oral)   Resp 16   Ht 5\' 6"  (1.676 m)   Wt 119 lb 11.4 oz (54.3 kg)   SpO2 100%   BMI 19.32 kg/m   Pertinent Labs, Studies, and Procedures:  MRI: Motion degraded examination. Multiple small acute RIGHT MCA and posterior watershed territory nonhemorrhagic infarcts. Old bilateral PCA, bilateral MCA and LEFT ACA territory infarcts. Old small cerebellar infarcts. Moderate to severe parenchymal brain volume loss.  MRA: Small irregular RIGHT ICA seen with proximal severe stenosis, progressed from 2012. Moderate stenosis LEFT cavernous ICA. 2 mm suspected LEFT cavernous ICA aneurysm. Severe stenosis LEFT M2 and RIGHT P2 segments. For the above findings, recommend CTA HEAD and neck for further characterization.  Carotid US: Right Carotid: Heavily calcified at the origin of the ICA with monophasic flow noted throughout the ICA and ECA. Left Carotid: Velocities in the left ICA are consistent with a 40-59% stenosis, highest end of scale.  Discharge Instructions: Discharge Instructions    Diet - low sodium heart healthy   Complete by:  As directed    Discharge instructions   Complete by:  As directed    Please continue to take plavix 75mg  daily   Increase activity slowly   Complete by:  As directed     [facility] and follow up with [facility] provider.   Signed: Camelia PhenesHoffman, Arriona Prest Ratliff, DO 05/08/2017, 1:44 PM   Pager: [phone]

## 2017-05-08 NOTE — Progress Notes (Addendum)
CSW informed by [facility] CSW that pt is approved for SNF and family requesting [facility]- CSW confirmed with pt dtr [name].  [facility] confirms availability for pt today.  [facility] Anticipated discharge date: 4/16 Family notified: [name] Transportation by Sharin MonsPTAR- called at 2:05pm Reoprt #: [phone]   CSW signing off.  Burna SisJenna H. Muaaz Brau, LCSW Clinical Social Worker [phone]

## 2017-05-08 NOTE — Discharge Instructions (Signed)
Please take plavix and aspirin for 3 months then just plavix alone

## 2017-05-08 NOTE — Progress Notes (Signed)
Stroke Team Progress Note      SUBJECTIVE Patient states she is doing well. No new complaints.Her daughter is present at the bedside throughout this visit.  OBJECTIVE Most recent Vital Signs: Temp: 97.7 F (36.5 C) (04/16 0426) Temp Source: Oral (04/16 0426) BP: 177/66 (04/16 1231) Pulse Rate: 69 (04/16 1231) Respiratory Rate: 16 O2 Saturdation: 100%  CBG (last 3)  No results for input(s): GLUCAP in the last 72 hours.  Diet: Fall precautions DIET DYS 2 Room service appropriate? Yes; Fluid consistency: Thin Diet - low sodium heart healthy   liquids  Activity: Up with assistance   VTE Prophylaxis:   scds  Studies: Results for orders placed or performed during the hospital encounter of 05/06/17 (from the past 24 hour(s))  Basic metabolic panel     Status: Abnormal   Collection Time: 05/08/17  6:45 AM  Result Value Ref Range   Sodium 138 135 - 145 mmol/L   Potassium 4.1 3.5 - 5.1 mmol/L   Chloride 107 101 - 111 mmol/L   CO2 23 22 - 32 mmol/L   Glucose, Bld 74 65 - 99 mg/dL   BUN 21 (H) 6 - 20 mg/dL   Creatinine, Ser 7.82 (H) 0.44 - 1.00 mg/dL   Calcium 8.8 (L) 8.9 - 10.3 mg/dL   GFR calc non Af Amer 30 (L) >60 mL/min   GFR calc Af Amer 35 (L) >60 mL/min   Anion gap 8 5 - 15     Dg Chest 2 View  Result Date: 05/06/2017 CLINICAL DATA:  TIA EXAM: CHEST - 2 VIEW COMPARISON:  None. FINDINGS: No significant pleural effusion. No focal airspace disease. Heart size within normal limits. Aortic atherosclerosis. No pneumothorax. IMPRESSION: No active cardiopulmonary disease. Patchy atelectasis at the left base. Electronically Signed   By: Jasmine Pang M.D.   On: 05/06/2017 22:20   Mr Brain Wo Contrast  Result Date: 05/06/2017 CLINICAL DATA:  Mild aphasia, LEFT-sided weakness, gait instability, RIGHT facial droop, dysphagia. Last seen at baseline 1430 hours. History of stroke, hypertension, hyperlipidemia and dementia. EXAM: MRI HEAD WITHOUT CONTRAST MRA HEAD WITHOUT CONTRAST  TECHNIQUE: Multiplanar, multiecho pulse sequences of the brain and surrounding structures were obtained without intravenous contrast. Angiographic images of the head were obtained using MRA technique without contrast. COMPARISON:  CT HEAD May 06, 2017. MRI of the head August 30, 2016. MRA head November 29, 2010. FINDINGS: MRI HEAD FINDINGS-multiple sequences are moderately or severely motion degraded. INTRACRANIAL CONTENTS: Numerous small foci of reduced diffusion RIGHT frontal, parietal lobes measuring to 13 mm predominately involving the cortex identifiable foci demonstrate low ADC values. RIGHT greater than LEFT occipital lobe, bilateral frontal including LEFT mesial frontal lobe and LEFT parietal encephalomalacia. Superimposed curvilinear susceptibility artifact LEFT parietal encephalomalacia. No susceptibility artifact to suggest lobar hematoma. Old small bilateral cerebellar infarcts. Moderate to severe parenchymal brain volume loss with cavum septum pellucidum. Scattered supratentorial white matter FLAIR T2 hyperintensities exclusive a aforementioned abnormality compatible with mild chronic small vessel ischemic disease. No abnormal extra-axial fluid collections. VASCULAR: Attenuated RIGHT ICA flow void. SKULL AND UPPER CERVICAL SPINE: Empty sella. No suspicious calvarial bone marrow signal. Craniocervical junction maintained. SINUSES/ORBITS: The mastoid air-cells and included paranasal sinuses are well-aerated.The included ocular globes and orbital contents are non-suspicious. OTHER: None. MRA HEAD FINDINGS ANTERIOR CIRCULATION: Asymmetrically smaller RIGHT internal carotid artery with poor flow related enhancement. Moderate luminal irregularity LEFT cavernous ICA compatible with atherosclerosis, vessel is patent. 2 mm superiorly directed outpouching LEFT cavernous ICA. Patent bilateral anterior  and middle cerebral arteries with slower flow RIGHT MCA. Severe stenosis LEFT M2 origin. No large vessel  occlusion. POSTERIOR CIRCULATION: LEFT vertebral artery out of field-of-view versus occluded. Patent basilar artery and main branch vessels. Severe stenosis RIGHT P2 segment. Patent bilateral posterior cerebral arteries. Basilar artery is patent, with normal flow related enhancement of the main branch vessels. Patent posterior cerebral arteries. Patent bilateral posterior communicating arteries. No large vessel occlusion, flow limiting stenosis,  aneurysm. ANATOMIC VARIANTS: Hypoplastic RIGHT A1 segment. Source images and MIP images were reviewed. IMPRESSION: MRI HEAD: 1. Motion degraded examination. 2. Multiple small acute RIGHT MCA and posterior watershed territory nonhemorrhagic infarcts. 3. Old bilateral PCA, bilateral MCA and LEFT ACA territory infarcts. Old small cerebellar infarcts. 4. Moderate to severe parenchymal brain volume loss. MRA HEAD: 1. Small irregular RIGHT ICA seen with proximal severe stenosis, progressed from 2012. 2. Moderate stenosis LEFT cavernous ICA. 2 mm suspected LEFT cavernous ICA aneurysm. 3. Severe stenosis LEFT M2 and RIGHT P2 segments. 4. For the above findings, recommend CTA HEAD and neck for further characterization. Electronically Signed   By: Awilda Metro M.D.   On: 05/06/2017 23:33   Mr Maxine Glenn Head Wo Contrast  Result Date: 05/06/2017 CLINICAL DATA:  Mild aphasia, LEFT-sided weakness, gait instability, RIGHT facial droop, dysphagia. Last seen at baseline 1430 hours. History of stroke, hypertension, hyperlipidemia and dementia. EXAM: MRI HEAD WITHOUT CONTRAST MRA HEAD WITHOUT CONTRAST TECHNIQUE: Multiplanar, multiecho pulse sequences of the brain and surrounding structures were obtained without intravenous contrast. Angiographic images of the head were obtained using MRA technique without contrast. COMPARISON:  CT HEAD May 06, 2017. MRI of the head August 30, 2016. MRA head November 29, 2010. FINDINGS: MRI HEAD FINDINGS-multiple sequences are moderately or severely motion  degraded. INTRACRANIAL CONTENTS: Numerous small foci of reduced diffusion RIGHT frontal, parietal lobes measuring to 13 mm predominately involving the cortex identifiable foci demonstrate low ADC values. RIGHT greater than LEFT occipital lobe, bilateral frontal including LEFT mesial frontal lobe and LEFT parietal encephalomalacia. Superimposed curvilinear susceptibility artifact LEFT parietal encephalomalacia. No susceptibility artifact to suggest lobar hematoma. Old small bilateral cerebellar infarcts. Moderate to severe parenchymal brain volume loss with cavum septum pellucidum. Scattered supratentorial white matter FLAIR T2 hyperintensities exclusive a aforementioned abnormality compatible with mild chronic small vessel ischemic disease. No abnormal extra-axial fluid collections. VASCULAR: Attenuated RIGHT ICA flow void. SKULL AND UPPER CERVICAL SPINE: Empty sella. No suspicious calvarial bone marrow signal. Craniocervical junction maintained. SINUSES/ORBITS: The mastoid air-cells and included paranasal sinuses are well-aerated.The included ocular globes and orbital contents are non-suspicious. OTHER: None. MRA HEAD FINDINGS ANTERIOR CIRCULATION: Asymmetrically smaller RIGHT internal carotid artery with poor flow related enhancement. Moderate luminal irregularity LEFT cavernous ICA compatible with atherosclerosis, vessel is patent. 2 mm superiorly directed outpouching LEFT cavernous ICA. Patent bilateral anterior and middle cerebral arteries with slower flow RIGHT MCA. Severe stenosis LEFT M2 origin. No large vessel occlusion. POSTERIOR CIRCULATION: LEFT vertebral artery out of field-of-view versus occluded. Patent basilar artery and main branch vessels. Severe stenosis RIGHT P2 segment. Patent bilateral posterior cerebral arteries. Basilar artery is patent, with normal flow related enhancement of the main branch vessels. Patent posterior cerebral arteries. Patent bilateral posterior communicating arteries. No  large vessel occlusion, flow limiting stenosis,  aneurysm. ANATOMIC VARIANTS: Hypoplastic RIGHT A1 segment. Source images and MIP images were reviewed. IMPRESSION: MRI HEAD: 1. Motion degraded examination. 2. Multiple small acute RIGHT MCA and posterior watershed territory nonhemorrhagic infarcts. 3. Old bilateral PCA, bilateral MCA and LEFT ACA territory  infarcts. Old small cerebellar infarcts. 4. Moderate to severe parenchymal brain volume loss. MRA HEAD: 1. Small irregular RIGHT ICA seen with proximal severe stenosis, progressed from 2012. 2. Moderate stenosis LEFT cavernous ICA. 2 mm suspected LEFT cavernous ICA aneurysm. 3. Severe stenosis LEFT M2 and RIGHT P2 segments. 4. For the above findings, recommend CTA HEAD and neck for further characterization. Electronically Signed   By: Awilda Metro M.D.   On: 05/06/2017 23:33   Ct Head Code Stroke Wo Contrast  Result Date: 05/06/2017 CLINICAL DATA:  Code stroke. Code stroke. Left-sided weakness and left facial droop. EXAM: CT HEAD WITHOUT CONTRAST TECHNIQUE: Contiguous axial images were obtained from the base of the skull through the vertex without intravenous contrast. COMPARISON:  None. FINDINGS: Brain: Multiple well-defined low-density areas involving cortex and white matter of the bilateral frontal, left parietal, and right occipital lobes consistent with multifocal remote infarct. No definite acute infarct. No hemorrhage, hydrocephalus, or masslike finding. Vascular: Atherosclerotic calcification.  No hyperdense vessel. Skull: No acute finding Sinuses/Orbits: No acute finding. Other: These results were communicated to Dr. Otelia Limes at 5:03 pmon 4/14/2019by text page via the [facility] messaging system. ASPECTS ([place] Stroke Program Early CT Score) Not scored in the setting of remote MCA territory infarcts. IMPRESSION: 1. No hemorrhage or definite acute infarct. 2. Multifocal remote appearing infarcts in the bilateral MCA, left ACA, and right PCA  distributions. Electronically Signed   By: Marnee Spring M.D.   On: 05/06/2017 17:05  Carotid Ultrasound :Right: Severe calcific plaque origin ICA with "thumping" waveform in the CCA and monophasic waveforms noted in the ICA and ECA post plaquing.  Left: 40-59% ICA stenosis, highest end of range.  Significant plaquing noted.  Bilateral vertebral artery flow is antegrade.   2DEcho : Left ventricle: The cavity size was normal. Systolic function was vigorous. The estimated ejection fraction was in the range of 65% to 70%. Wall motion was normal; there were no regional wall motion abnormalities.   Physical Exam:    Pleasant elderly lady currently not in distress. . Afebrile. Head is nontraumatic. Neck is supple without bruit.    Cardiac exam no murmur or gallop. Lungs are clear to auscultation. Distal pulses are well felt. Neurological Exam :  Awake alert oriented 2. Diminished attention, registration and recall. Slight dysarthria but can be understood. Follows commands. Extraocular moments are full range without nystagmus. Blinks to threat bilaterally. Fundi not visualized. Left lower facial weakness. Tongue midline. Motor system exam no upper or lower extremity drift but weakness of left grip and intrinsic hand muscles. Orbits right over left upper extremity. Lower extremity strength is symmetric 4/5 bilaterally.Sensation appears preserved bilaterally. Gait not tested. ASSESSMENT Ms. DAYNA ALIA is a 82 y.o. female with embolic right frontal MCA branch infarct likely secondary to embolization from proximal right ICA high-grade stenosis versus occlusion., Vascular risk factors of hypertension, hyperlipidemia and carotid disease Hospital day # 1  TREATMENT/PLAN I  recommended dual antiplatelet therapy  With aspirin and Plavix for 3 months given right carotid occlusion.I asked the patient daughter specifically for what kind of allergy she has to aspirin and she informed me that it upsets her  stomach. She categorically denies history of anaphylaxis. I recommend taking enteric-coated 81 mg aspirin for 3 months alone and then stopping it Statin for elevated lipids and strict control of hypertension with blood pressure goal below 130/90 her discharge. .Patient has elevated serum creatinine hence we will not do CT angiogram or catheter angiogram to look for  string sign. Long discussion with patient and daughter the bedside and answered questions.Discussed with family admission team medical resident. Follow-up as an outpatient in the stroke clinic in 6 weeks. Stroke team will sign off. Kindly call for questions. Transfer to skilled nursing facility for rehabilitation when bed available. Delia HeadyPramod Takaya Hyslop, MD [facility] Pager: [phone] 05/08/2017 2:19 PM

## 2017-05-08 NOTE — Care Management Note (Signed)
Case Management Note  Patient Details  Name: Emma NakayamaSarah M Tyler MRN: [id number] Date of Birth: 06/11/1935  Subjective/Objective:                    Action/Plan: Pt discharging to [facility] today. CM signing off.   Expected Discharge Date:  05/08/17               Expected Discharge Plan:  Skilled Nursing Facility  In-House Referral:  Clinical Social Work  Discharge planning Services     Post Acute Care Choice:    Choice offered to:     DME Arranged:    DME Agency:     HH Arranged:    HH Agency:     Status of Service:  Completed, signed off  If discussed at [employer] Meetings, dates discussed:    Additional Comments:  Kermit BaloKelli F Lorilee Cafarella, RN 05/08/2017, 2:43 PM

## 2017-05-08 NOTE — Plan of Care (Signed)
progressing 

## 2017-05-08 NOTE — Plan of Care (Signed)
Adequate for discharge.

## 2017-05-08 NOTE — Progress Notes (Signed)
Internal Medicine Attending:   I saw and examined the patient. I reviewed the resident's note and I agree with the resident's findings and plan as documented in the resident's note.  Patient feels well today with no new complaints.  She still has some dysarthria.  Patient presented to the ED with new left-sided deficits in the setting of a history of CVA and was found to have a right MCA and posterior watershed infarcts on MRI.  Neuro follow-up and recommendations appreciated.  PT/OT recommending SNF placement.  2D echo results noted-normal EF with no acute regional wall motion abnormalities.  Patient will require dual antiplatelet therapy with aspirin and Plavix for 3 months given her right carotid occlusion.  On chart patient was noted to have an allergy to aspirin but neurology clarified with patient's daughter and she only had nausea with this medication.  Continue with high intensity statin.  Patient will need strict control of her blood pressure with a goal of 130/80.  No further workup at this time.  Patient stable for discharge to SNF once bed is available.

## 2017-05-18 ENCOUNTER — Other Ambulatory Visit (HOSPITAL_COMMUNITY): Payer: Self-pay | Admitting: Family Medicine

## 2017-05-18 DIAGNOSIS — R1310 Dysphagia, unspecified: Secondary | ICD-10-CM

## 2017-05-21 ENCOUNTER — Ambulatory Visit (HOSPITAL_COMMUNITY)
Admission: RE | Admit: 2017-05-21 | Discharge: 2017-05-21 | Disposition: A | Discharge status: Home / Self Care | Payer: Medicare (Managed Care) | Source: Ambulatory Visit | Attending: Family Medicine | Admitting: Family Medicine

## 2017-05-21 DIAGNOSIS — I69321 Dysphasia following cerebral infarction: Secondary | ICD-10-CM | POA: Insufficient documentation

## 2017-05-21 DIAGNOSIS — R1311 Dysphagia, oral phase: Secondary | ICD-10-CM | POA: Diagnosis not present

## 2017-05-21 DIAGNOSIS — I69391 Dysphagia following cerebral infarction: Secondary | ICD-10-CM | POA: Insufficient documentation

## 2017-05-21 DIAGNOSIS — R1310 Dysphagia, unspecified: Secondary | ICD-10-CM

## 2017-05-21 DIAGNOSIS — I69322 Dysarthria following cerebral infarction: Secondary | ICD-10-CM | POA: Diagnosis present

## 2017-07-11 ENCOUNTER — Non-Acute Institutional Stay (SKILLED_NURSING_FACILITY): Payer: Medicare Other | Admitting: Internal Medicine

## 2017-07-11 ENCOUNTER — Encounter: Payer: Self-pay | Admitting: Internal Medicine

## 2017-07-11 DIAGNOSIS — K219 Gastro-esophageal reflux disease without esophagitis: Secondary | ICD-10-CM

## 2017-07-11 DIAGNOSIS — I63031 Cerebral infarction due to thrombosis of right carotid artery: Secondary | ICD-10-CM

## 2017-07-11 DIAGNOSIS — D509 Iron deficiency anemia, unspecified: Secondary | ICD-10-CM | POA: Diagnosis not present

## 2017-07-11 DIAGNOSIS — J309 Allergic rhinitis, unspecified: Secondary | ICD-10-CM

## 2017-07-11 DIAGNOSIS — N183 Chronic kidney disease, stage 3 unspecified: Secondary | ICD-10-CM

## 2017-07-11 DIAGNOSIS — I10 Essential (primary) hypertension: Secondary | ICD-10-CM | POA: Diagnosis not present

## 2017-07-11 DIAGNOSIS — E785 Hyperlipidemia, unspecified: Secondary | ICD-10-CM

## 2017-07-11 DIAGNOSIS — E559 Vitamin D deficiency, unspecified: Secondary | ICD-10-CM | POA: Diagnosis not present

## 2017-07-11 DIAGNOSIS — Z8673 Personal history of transient ischemic attack (TIA), and cerebral infarction without residual deficits: Secondary | ICD-10-CM

## 2017-07-11 DIAGNOSIS — F5101 Primary insomnia: Secondary | ICD-10-CM

## 2017-07-11 DIAGNOSIS — I5032 Chronic diastolic (congestive) heart failure: Secondary | ICD-10-CM | POA: Diagnosis not present
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
## 2017-07-16 ENCOUNTER — Ambulatory Visit: Payer: Medicare (Managed Care) | Admitting: Family

## 2017-07-16 ENCOUNTER — Encounter (HOSPITAL_COMMUNITY): Payer: Medicare (Managed Care)

## 2017-08-03 ENCOUNTER — Encounter: Payer: Self-pay | Admitting: Internal Medicine

## 2017-08-03 DIAGNOSIS — K219 Gastro-esophageal reflux disease without esophagitis: Secondary | ICD-10-CM | POA: Insufficient documentation

## 2017-08-03 DIAGNOSIS — E559 Vitamin D deficiency, unspecified: Secondary | ICD-10-CM | POA: Insufficient documentation

## 2017-08-03 DIAGNOSIS — G4700 Insomnia, unspecified: Secondary | ICD-10-CM | POA: Insufficient documentation

## 2017-08-03 DIAGNOSIS — J309 Allergic rhinitis, unspecified: Secondary | ICD-10-CM | POA: Insufficient documentation

## 2017-08-07 LAB — BASIC METABOLIC PANEL
BUN: 30 — AB (ref 4–21)
Creatinine: 1.4 — AB (ref 0.5–1.1)
GLUCOSE: 83
Potassium: 4.5 (ref 3.4–5.3)
Sodium: 142 (ref 137–147)

## 2017-08-07 LAB — CBC AND DIFFERENTIAL
HEMATOCRIT: 32 — AB (ref 36–46)
Hemoglobin: 10.6 — AB (ref 12.0–16.0)
Platelets: 177 (ref 150–399)
WBC: 5.1

## 2017-08-07 LAB — TSH: TSH: 1.79 (ref 0.41–5.90)

## 2017-08-07 LAB — HEPATIC FUNCTION PANEL
ALK PHOS: 67 (ref 25–125)
ALT: 23 (ref 7–35)
AST: 21 (ref 13–35)
Bilirubin, Total: 0.4

## 2017-08-07 LAB — LIPID PANEL
Cholesterol: 145 (ref 0–200)
HDL: 66 (ref 35–70)
LDL CALC: 63
Triglycerides: 76 (ref 40–160)

## 2017-08-07 LAB — IRON,TIBC AND FERRITIN PANEL
Ferritin: 470.7
IRON: 63
TIBC: 195
UIBC: 132

## 2017-08-07 LAB — HEMOGLOBIN A1C: Hemoglobin A1C: 5.6

## 2017-08-07 LAB — VITAMIN D 25 HYDROXY (VIT D DEFICIENCY, FRACTURES): Vit D, 25-Hydroxy: 40.85

## 2017-08-07 LAB — VITAMIN B12: Vitamin B-12: 573

## 2017-08-09 ENCOUNTER — Encounter: Payer: Self-pay | Admitting: Internal Medicine

## 2017-08-09 ENCOUNTER — Non-Acute Institutional Stay (SKILLED_NURSING_FACILITY): Payer: Medicare Other | Admitting: Internal Medicine

## 2017-08-09 DIAGNOSIS — I10 Essential (primary) hypertension: Secondary | ICD-10-CM

## 2017-08-09 DIAGNOSIS — E785 Hyperlipidemia, unspecified: Secondary | ICD-10-CM

## 2017-08-09 DIAGNOSIS — Z8673 Personal history of transient ischemic attack (TIA), and cerebral infarction without residual deficits: Secondary | ICD-10-CM

## 2017-08-09 NOTE — Progress Notes (Signed)
Location:  [facility] Room Number: 305D Place of Service:  SNF ([phone])  [name]. Emma Hollingshead, MD  Patient Care Team: Margit Hanks, MD as PCP - General (Internal Medicine)  Extended Emergency Contact Information Primary Emergency Contact: [name],[name] Address: [address]          [place], [place] [address] [name] of [place] Phone: [phone] Relation: Daughter Secondary Emergency Contact: [name] [place] of [place] Home Phone: [phone] Relation: Grandson    Allergies: Aspirin and Aspirin  Chief Complaint  Patient presents with  . Medical Management of Chronic Issues    Routine Visit    HPI: Patient is 82 y.o. female who is being seen for routine issues of hypertension, history of CVA, and hyperlipidemia.  Past Medical History:  Diagnosis Date  . Arthritis    hands  . Asthma   . Chronic diastolic CHF (congestive heart failure) (HCC)   . Chronic kidney disease (CKD), stage III (moderate) (HCC)   . Dementia   . Depression   . Diabetes mellitus without complication (HCC)   . Diverticulosis   . Hx of adenomatous colonic polyps   . Hyperlipidemia   . Hypertension   . Hypochromic anemia   . Internal hemorrhoids   . Stroke [facility]) May 2009   multiple, last one Nov 2012, 2 strokes with right sided deficits and slurred speech  . Stroke [facility])     Past Surgical History:  Procedure Laterality Date  . ABDOMINAL HYSTERECTOMY    . ABDOMINAL SURGERY    . BOWEL RESECTION    . CHOLECYSTECTOMY N/A 09/15/2016   Procedure: LAPAROSCOPIC CHOLECYSTECTOMY WITH INTRAOPERATIVE CHOLANGIOGRAM;  Surgeon: Ovidio Kin, MD;  Location: [facility]S;  Service: General;  Laterality: N/A;  . COLECTOMY    . ESOPHAGOGASTRODUODENOSCOPY N/A 02/01/2016   Procedure: ESOPHAGOGASTRODUODENOSCOPY (EGD);  Surgeon: Beverley Fiedler, MD;  Location: [facility] ENDOSCOPY;  Service: Endoscopy;  Laterality: N/A;  . FLEXIBLE  SIGMOIDOSCOPY N/A 05/28/2012   Procedure: FLEXIBLE SIGMOIDOSCOPY;  Surgeon: Hart Carwin, MD;  Location: [facility] ENDOSCOPY;  Service: Endoscopy;  Laterality: N/A;  . OTHER SURGICAL HISTORY     2 ear surgeries  . PERIPHERAL VASCULAR CATHETERIZATION N/A 07/21/2014   Procedure: Abdominal Aortogram w/Lower Extremity;  Surgeon: Nada Libman, MD;  [facility] LAB;  Service: Cardiovascular;  Laterality: N/A;  . PERIPHERAL VASCULAR CATHETERIZATION N/A 11/23/2015   Procedure: Abdominal Aortogram w/Lower Extremity;  Surgeon: Nada Libman, MD;  [facility];  Service: Cardiovascular;  Laterality: N/A;  . PERIPHERAL VASCULAR CATHETERIZATION  11/23/2015   Procedure: Peripheral Vascular Intervention;  Surgeon: Nada Libman, MD;  [facility];  Service: Cardiovascular;;  Failed PVI of AT and Peroneal on left side  . SUBTOTAL COLECTOMY  2010   for stricturing from bouts of diverticulitis along with hx recurrent diverticular bleeds.  the surgery was a total abdominal colec  . TONSILLECTOMY    . VASCULAR SURGERY      Allergies as of 08/09/2017      Reactions   Aspirin Nausea Only   Aspirin Nausea And Vomiting   Hurts stomach      Medication List        Accurate as of 08/09/17 11:59 PM. Always use your most recent med list.          atorvastatin 80 MG tablet Commonly known as:  LIPITOR  Take 1 tablet (80 mg total) by mouth daily at 6 PM.   clopidogrel 75 MG tablet Commonly known as:  PLAVIX Take 75 mg by mouth daily.   GLUCERNA Liqd Take 237 mLs by mouth 3 (three) times daily between meals.   loratadine 10 MG tablet Commonly known as:  CLARITIN Take 10 mg by mouth daily.   losartan 50 MG tablet Commonly known as:  COZAAR Take 50 mg by mouth daily.   Melatonin 5 MG Tabs Take 1 tablet by mouth at bedtime.   ranitidine 75 MG tablet Commonly known as:  ZANTAC Take 75 mg by mouth at bedtime.   Vitamin D3 1000 units Caps Take 1,000 Units by  mouth daily.       No orders of the defined types were placed in this encounter.   Immunization History  Administered Date(s) Administered  . Influenza Split 12/01/2010, 03/24/2012  . Pneumococcal Conjugate-13 04/24/2014    Social History   Tobacco Use  . Smoking status: Never Smoker  . Smokeless tobacco: Never Used  Substance Use Topics  . Alcohol use: No    Review of Systems  DATA OBTAINED: from patient, nurse, medical record, family member GENERAL:  no fevers, fatigue, appetite changes SKIN: No itching, rash HEENT: No complaint RESPIRATORY: No cough, wheezing, SOB CARDIAC: No chest pain, palpitations, lower extremity edema  GI: No abdominal pain, No N/V/D or constipation, No heartburn or reflux  GU: No dysuria, frequency or urgency, or incontinence  MUSCULOSKELETAL: No unrelieved bone/joint pain NEUROLOGIC: No headache, dizziness  PSYCHIATRIC: No overt anxiety or sadness  Vitals:   08/09/17 1525  BP: 131/76  Pulse: 86  Resp: 18  Temp: 98.4 F (36.9 C)   Body mass index is 19.95 kg/m. Physical Exam  GENERAL APPEARANCE: Alert, conversant, No acute distress  SKIN: No diaphoresis rash HEENT: Unremarkable RESPIRATORY: Breathing is even, unlabored. Lung sounds are clear   CARDIOVASCULAR: Heart RRR 6 systolic murmur, no rubs or gallops. No peripheral edema  GASTROINTESTINAL: Abdomen is soft, non-tender, not distended w/ normal bowel sounds.  GENITOURINARY: Bladder non tender, not distended  MUSCULOSKELETAL: No abnormal joints or musculature NEUROLOGIC: Cranial nerves 2-12 grossly intact. Moves all extremities PSYCHIATRIC: Mood and affect appropriate to situation, no behavioral issues  Patient Active Problem List   Diagnosis Date Noted  . GERD (gastroesophageal reflux disease) 08/03/2017  . Vitamin D deficiency 08/03/2017  . Insomnia 08/03/2017  . Allergic rhinitis 08/03/2017  . Cerebral infarction (HCC)   . Stroke (HCC) 05/07/2017  . RUQ abdominal pain  09/11/2016  . Postural dizziness with presyncope 09/11/2016  . RUQ pain 09/11/2016  . Stroke (cerebrum) (HCC) 04/30/2016  . Hyperkalemia 04/30/2016  . Acute encephalopathy 04/30/2016  . Chronic diastolic CHF (congestive heart failure) (HCC) 04/30/2016  . Multiple duodenal ulcers   . RLQ abdominal pain   . Gallstones   . Elevated liver enzymes   . Abnormal finding on GI tract imaging   . Pancreas cyst   . Choledocholithiasis 01/30/2016  . Acute kidney injury (HCC) 01/30/2016  . Non-intractable vomiting with nausea   . Bleeding 11/23/2015  . Chronic kidney disease 05/29/2012  . Skin ulcer, stage 2 (HCC) 05/29/2012  . Internal hemorrhoid, bleeding 05/29/2012  . Severe protein-calorie malnutrition (HCC) 03/24/2012  . Hyponatremia 03/23/2012  . Hypokalemia 03/23/2012  . Urinary tract infection 03/23/2012  . Physical deconditioning 03/23/2012  . CKD (chronic kidney disease) stage 3, GFR 30-59 ml/min (HCC) 12/01/2010  . Encephalopathy 12/01/2010  . CVA (cerebral vascular accident) (  HCC) 11/28/2010  . ASTHMA 08/25/2008  . COLONIC POLYPS, ADENOMATOUS, HX OF 08/25/2008  . HLD (hyperlipidemia) 08/17/2008  . Microcytic hypochromic anemia 08/17/2008  . Hypertension 08/17/2008  . History of CVA (cerebrovascular accident) 08/17/2008  . DIVERTICULITIS, COLON 08/17/2008  . HYPERGLYCEMIA 08/17/2008    CMP     Component Value Date/Time   NA 142 08/07/2017   K 4.5 08/07/2017   CL 107 05/08/2017 0645   CO2 23 05/08/2017 0645   GLUCOSE 74 05/08/2017 0645   BUN 30 (A) 08/07/2017   CREATININE 1.4 (A) 08/07/2017   CREATININE 1.58 (H) 05/08/2017 0645   CALCIUM 8.8 (L) 05/08/2017 0645   PROT 6.6 05/06/2017 1730   ALBUMIN 3.3 (L) 05/06/2017 1730   AST 21 08/07/2017   ALT 23 08/07/2017   ALKPHOS 67 08/07/2017   BILITOT 0.5 05/06/2017 1730   GFRNONAA 30 (L) 05/08/2017 0645   GFRAA 35 (L) 05/08/2017 0645   Recent Labs    09/12/16 0554  03/15/17 1655 05/06/17 1701 05/06/17 1730  05/08/17 0645 08/07/17  NA 137   < > 138 137 136 138 142  K 3.8   < > 4.1 4.9 4.8 4.1 4.5  CL 106   < > 105 105 105 107  --   CO2 26   < > 24  --  23 23  --   GLUCOSE 87   < > 178* 180* 179* 74  --   BUN 20   < > 33* 25* 24* 21* 30*  CREATININE 1.53*   < > 1.61* 1.70* 1.80* 1.58* 1.4*  CALCIUM 8.8*   < > 8.9  --  9.1 8.8*  --   MG 2.0  --  2.3  --   --   --   --   PHOS 3.4  --   --   --   --   --   --    < > = values in this interval not displayed.   Recent Labs    09/16/16 0540 03/15/17 1655 05/06/17 1730 08/07/17  AST 45* 23 24 21   ALT 29 13* 14 23  ALKPHOS 44 69 50 67  BILITOT 0.3 0.4 0.5  --   PROT 5.4* 6.5 6.6  --   ALBUMIN 2.6* 3.5 3.3*  --    Recent Labs    09/11/16 1538  09/16/16 0540 03/15/17 1655 05/06/17 1701 05/06/17 1730 08/07/17  WBC 4.7   < > 7.5 4.8  --  4.8 5.1  NEUTROABS 3.6  --   --  3.7  --  3.7  --   HGB 10.5*   < > 9.2* 11.6* 12.2 11.4* 10.6*  HCT 31.8*   < > 27.7* 36.4 36.0 36.1 32*  MCV 86.2   < > 85.0 87.9  --  87.6  --   PLT 200   < > 164 198  --  199 177   < > = values in this interval not displayed.   Recent Labs    05/07/17 0327 08/07/17  CHOL 226* 145  LDLCALC 145* 63  TRIG 56 76   No results found for: [facility] Lab Results  Component Value Date   TSH 1.79 08/07/2017   Lab Results  Component Value Date   HGBA1C 5.6 08/07/2017   Lab Results  Component Value Date   CHOL 145 08/07/2017   HDL 66 08/07/2017   LDLCALC 63 08/07/2017   TRIG 76 08/07/2017   CHOLHDL 3.2 05/07/2017    Significant Diagnostic  Results in last 30 days:  No results found.  Assessment and Plan  Hypertension Controlled; continue Cozaar 50 mg daily  History of CVA (cerebrovascular accident) Chronic and stable; continue Plavix 75 mg daily: Patient is on statin and lipids are well controlled: Patient is not a diabetic  HLD (hyperlipidemia) LDL 63, HDL 66; good control on Lipitor 80 mg daily; continue Lipitor    [name] D. Emma Hollingshead, MD

## 2017-08-13 ENCOUNTER — Non-Acute Institutional Stay (SKILLED_NURSING_FACILITY): Payer: Medicare Other

## 2017-08-13 DIAGNOSIS — Z0000 Encounter for general adult medical examination without abnormal findings: Secondary | ICD-10-CM

## 2017-08-13 NOTE — Patient Instructions (Addendum)
[name] , Thank you for taking time to come for your Medicare Wellness Visit. I appreciate your ongoing commitment to your health goals. Please review the following plan we discussed and let me know if I can assist you in the future.   Screening recommendations/referrals: Colonoscopy excluded, over age 80 Mammogram excluded, over age 80 Bone Density due, ordered Recommended yearly ophthalmology/optometry visit for glaucoma screening and checkup Recommended yearly dental visit for hygiene and checkup  Vaccinations: Influenza vaccine dur 2019 fall season Pneumococcal vaccine 23 due, ordered Tdap vaccine due, ordered Shingles vaccine not in past records    Advanced directives: Need a copy for chart  Conditions/risks identified: none  Next appointment: Dr. Lyn HollingsheadAlexander makes rounds   Preventive Care 65 Years and Older, Female Preventive care refers to lifestyle choices and visits with your health care provider that can promote health and wellness. What does preventive care include?  A yearly physical exam. This is also called an annual well check.  Dental exams once or twice a year.  Routine eye exams. Ask your health care provider how often you should have your eyes checked.  Personal lifestyle choices, including:  Daily care of your teeth and gums.  Regular physical activity.  Eating a healthy diet.  Avoiding tobacco and drug use.  Limiting alcohol use.  Practicing safe sex.  Taking low-dose aspirin every day.  Taking vitamin and mineral supplements as recommended by your health care provider. What happens during an annual well check? The services and screenings done by your health care provider during your annual well check will depend on your age, overall health, lifestyle risk factors, and family history of disease. Counseling  Your health care provider may ask you questions about your:  Alcohol use.  Tobacco use.  Drug use.  Emotional  well-being.  Home and relationship well-being.  Sexual activity.  Eating habits.  History of falls.  Memory and ability to understand (cognition).  Work and work Astronomerenvironment.  Reproductive health. Screening  You may have the following tests or measurements:  Height, weight, and BMI.  Blood pressure.  Lipid and cholesterol levels. These may be checked every 5 years, or more frequently if you are over 268 years old.  Skin check.  Lung cancer screening. You may have this screening every year starting at age 80 if you have a 30-pack-year history of smoking and currently smoke or have quit within the past 15 years.  Fecal occult blood test (FOBT) of the stool. You may have this test every year starting at age 80.  Flexible sigmoidoscopy or colonoscopy. You may have a sigmoidoscopy every 5 years or a colonoscopy every 10 years starting at age 80.  Hepatitis C blood test.  Hepatitis B blood test.  Sexually transmitted disease (STD) testing.  Diabetes screening. This is done by checking your blood sugar (glucose) after you have not eaten for a while (fasting). You may have this done every 1-3 years.  Bone density scan. This is done to screen for osteoporosis. You may have this done starting at age 80.  Mammogram. This may be done every 1-2 years. Talk to your health care provider about how often you should have regular mammograms. Talk with your health care provider about your test results, treatment options, and if necessary, the need for more tests. Vaccines  Your health care provider may recommend certain vaccines, such as:  Influenza vaccine. This is recommended every year.  Tetanus, diphtheria, and acellular pertussis (Tdap, Td) vaccine. You may need  a Td booster every 10 years.  Zoster vaccine. You may need this after age 75.  Pneumococcal 13-valent conjugate (PCV13) vaccine. One dose is recommended after age 5.  Pneumococcal polysaccharide (PPSV23) vaccine. One  dose is recommended after age 80. Talk to your health care provider about which screenings and vaccines you need and how often you need them. This information is not intended to replace advice given to you by your health care provider. Make sure you discuss any questions you have with your health care provider. Document Released: 02/05/2015 Document Revised: 09/29/2015 Document Reviewed: 11/10/2014 Elsevier Interactive Patient Education  2017 [place] Prevention in the Home Falls can cause injuries. They can happen to people of all ages. There are many things you can do to make your home safe and to help prevent falls. What can I do on the outside of my home?  Regularly fix the edges of walkways and driveways and fix any cracks.  Remove anything that might make you trip as you walk through a door, such as a raised step or threshold.  Trim any bushes or trees on the path to your home.  Use bright outdoor lighting.  Clear any walking paths of anything that might make someone trip, such as rocks or tools.  Regularly check to see if handrails are loose or broken. Make sure that both sides of any steps have handrails.  Any raised decks and porches should have guardrails on the edges.  Have any leaves, snow, or ice cleared regularly.  Use sand or salt on walking paths during winter.  Clean up any spills in your garage right away. This includes oil or grease spills. What can I do in the bathroom?  Use night lights.  Install grab bars by the toilet and in the tub and shower. Do not use towel bars as grab bars.  Use non-skid mats or decals in the tub or shower.  If you need to sit down in the shower, use a plastic, non-slip stool.  Keep the floor dry. Clean up any water that spills on the floor as soon as it happens.  Remove soap buildup in the tub or shower regularly.  Attach bath mats securely with double-sided non-slip rug tape.  Do not have throw rugs and other  things on the floor that can make you trip. What can I do in the bedroom?  Use night lights.  Make sure that you have a light by your bed that is easy to reach.  Do not use any sheets or blankets that are too big for your bed. They should not hang down onto the floor.  Have a firm chair that has side arms. You can use this for support while you get dressed.  Do not have throw rugs and other things on the floor that can make you trip. What can I do in the kitchen?  Clean up any spills right away.  Avoid walking on wet floors.  Keep items that you use a lot in easy-to-reach places.  If you need to reach something above you, use a strong step stool that has a grab bar.  Keep electrical cords out of the way.  Do not use floor polish or wax that makes floors slippery. If you must use wax, use non-skid floor wax.  Do not have throw rugs and other things on the floor that can make you trip. What can I do with my stairs?  Do not leave any items on the  stairs.  Make sure that there are handrails on both sides of the stairs and use them. Fix handrails that are broken or loose. Make sure that handrails are as long as the stairways.  Check any carpeting to make sure that it is firmly attached to the stairs. Fix any carpet that is loose or worn.  Avoid having throw rugs at the top or bottom of the stairs. If you do have throw rugs, attach them to the floor with carpet tape.  Make sure that you have a light switch at the top of the stairs and the bottom of the stairs. If you do not have them, ask someone to add them for you. What else can I do to help prevent falls?  Wear shoes that:  Do not have high heels.  Have rubber bottoms.  Are comfortable and fit you well.  Are closed at the toe. Do not wear sandals.  If you use a stepladder:  Make sure that it is fully opened. Do not climb a closed stepladder.  Make sure that both sides of the stepladder are locked into place.  Ask  someone to hold it for you, if possible.  Clearly mark and make sure that you can see:  Any grab bars or handrails.  First and last steps.  Where the edge of each step is.  Use tools that help you move around (mobility aids) if they are needed. These include:  Canes.  Walkers.  Scooters.  Crutches.  Turn on the lights when you go into a dark area. Replace any light bulbs as soon as they burn out.  Set up your furniture so you have a clear path. Avoid moving your furniture around.  If any of your floors are uneven, fix them.  If there are any pets around you, be aware of where they are.  Review your medicines with your doctor. Some medicines can make you feel dizzy. This can increase your chance of falling. Ask your doctor what other things that you can do to help prevent falls. This information is not intended to replace advice given to you by your health care provider. Make sure you discuss any questions you have with your health care provider. Document Released: 11/05/2008 Document Revised: 06/17/2015 Document Reviewed: 02/13/2014 Elsevier Interactive Patient Education  2017 [employer].

## 2017-08-13 NOTE — Progress Notes (Signed)
Subjective:   [name] is a 82 y.o. female who presents for an Initial Medicare Annual Wellness Visit at [facility]    Objective:    Today's Vitals   08/13/17 1148  BP: 136/72  Pulse: 78  Temp: 98.1 F (36.7 C)  TempSrc: Oral  Weight: 123 lb (55.8 kg)  Height: 5\' 6"  (1.676 m)  PainSc: 5    Body mass index is 19.85 kg/m.  Advanced Directives 08/13/2017 07/11/2017 05/07/2017 05/06/2017 09/11/2016 08/30/2016 07/10/2016  Does Patient Have a Medical Advance Directive? No No No No No No No  Does patient want to make changes to medical advance directive? No - Patient declined - - - - - -  Would patient like information on creating a medical advance directive? - - No - Patient declined - No - Patient declined - -  Pre-existing out of facility DNR order (yellow form or pink MOST form) - - - - - - -    Current Medications (verified) Outpatient Encounter Medications as of 08/13/2017  Medication Sig  . atorvastatin (LIPITOR) 80 MG tablet Take 1 tablet (80 mg total) by mouth daily at 6 PM.  . Cholecalciferol (VITAMIN D3) 1000 UNITS CAPS Take 1,000 Units by mouth daily.   . clopidogrel (PLAVIX) 75 MG tablet Take 75 mg by mouth daily.  Marland Kitchen. GLUCERNA (GLUCERNA) LIQD Take 237 mLs by mouth 3 (three) times daily between meals.  Marland Kitchen. loratadine (CLARITIN) 10 MG tablet Take 10 mg by mouth daily.   Marland Kitchen. losartan (COZAAR) 50 MG tablet Take 50 mg by mouth daily.  . Melatonin 5 MG TABS Take 1 tablet by mouth at bedtime.  . ranitidine (ZANTAC) 75 MG tablet Take 75 mg by mouth at bedtime.   No facility-administered encounter medications on file as of 08/13/2017.     Allergies (verified) Aspirin and Aspirin   History: Past Medical History:  Diagnosis Date  . Arthritis    hands  . Asthma   . Chronic diastolic CHF (congestive heart failure) (HCC)   . Chronic kidney disease (CKD), stage III (moderate) (HCC)   . Dementia   . Depression   . Diabetes mellitus without complication (HCC)     . Diverticulosis   . Hx of adenomatous colonic polyps   . Hyperlipidemia   . Hypertension   . Hypochromic anemia   . Internal hemorrhoids   . Stroke [facility](HCC) May 2009   multiple, last one Nov 2012, 2 strokes with right sided deficits and slurred speech  . Stroke [facility](HCC)    Past Surgical History:  Procedure Laterality Date  . ABDOMINAL HYSTERECTOMY    . ABDOMINAL SURGERY    . BOWEL RESECTION    . CHOLECYSTECTOMY N/A 09/15/2016   Procedure: LAPAROSCOPIC CHOLECYSTECTOMY WITH INTRAOPERATIVE CHOLANGIOGRAM;  Surgeon: [name], [name], MD;  Location: [facility] ORS;  Service: General;  Laterality: N/A;  . COLECTOMY    . ESOPHAGOGASTRODUODENOSCOPY N/A 02/01/2016   Procedure: ESOPHAGOGASTRODUODENOSCOPY (EGD);  Surgeon: [name], MD;  [facility];  Service: Endoscopy;  Laterality: N/A;  . FLEXIBLE SIGMOIDOSCOPY N/A 05/28/2012   Procedure: FLEXIBLE SIGMOIDOSCOPY;  Surgeon: [name], MD;  [facility];  Service: Endoscopy;  Laterality: N/A;  . OTHER SURGICAL HISTORY     2 ear surgeries  . PERIPHERAL VASCULAR CATHETERIZATION N/A 07/21/2014   Procedure: Abdominal Aortogram w/Lower Extremity;  Surgeon: [name], MD;  Location: [facility] INVASIVE CV LAB;  Service: Cardiovascular;  Laterality: N/A;  . PERIPHERAL VASCULAR CATHETERIZATION N/A  11/23/2015   Procedure: Abdominal Aortogram w/Lower Extremity;  Surgeon: Nada Libman, MD;  Location: [facility] INVASIVE CV LAB;  Service: Cardiovascular;  Laterality: N/A;  . PERIPHERAL VASCULAR CATHETERIZATION  11/23/2015   Procedure: Peripheral Vascular Intervention;  Surgeon: Nada Libman, MD;  Location: [facility] INVASIVE CV LAB;  Service: Cardiovascular;;  Failed PVI of AT and Peroneal on left side  . SUBTOTAL COLECTOMY  2010   for stricturing from bouts of diverticulitis along with hx recurrent diverticular bleeds.  the surgery was a total abdominal colec  . TONSILLECTOMY    . VASCULAR SURGERY     Family History  Problem Relation Age of Onset  .  Diabetes Paternal Aunt   . Stroke Father   . Heart disease Father   . Diabetes Father   . Depression Father   . HIV Brother   . Prostate cancer Unknown   . Cancer Sister        unknown type  . Colon cancer Neg Hx    Social History   Socioeconomic History  . Marital status: Married    Spouse name: Not on file  . Number of children: Not on file  . Years of education: Not on file  . Highest education level: Not on file  Occupational History  . Not on file  Social Needs  . Financial resource strain: Not hard at all  . Food insecurity:    Worry: Never true    Inability: Never true  . Transportation needs:    Medical: No    Non-medical: No  Tobacco Use  . Smoking status: Never Smoker  . Smokeless tobacco: Never Used  Substance and Sexual Activity  . Alcohol use: No  . Drug use: No  . Sexual activity: Never  Lifestyle  . Physical activity:    Days per week: 0 days    Minutes per session: 0 min  . Stress: Only a little  Relationships  . Social connections:    Talks on phone: Once a week    Gets together: Once a week    Attends religious service: Never    Active member of club or organization: No    Attends meetings of clubs or organizations: Never    Relationship status: Married  Other Topics Concern  . Not on file  Social History Narrative   ** Merged History Encounter **        Tobacco Counseling Counseling given: Not Answered   Clinical Intake:  Pre-visit preparation completed: No  Pain : 0-10 Pain Score: 5  Pain Type: Chronic pain Pain Location: Shoulder Pain Orientation: Left Pain Descriptors / Indicators: Aching Pain Onset: More than a month ago Pain Frequency: Intermittent     Nutritional Risks: None Diabetes: Yes CBG done?: No Did pt. bring in CBG monitor from home?: No  How often do you need to have someone help you when you read instructions, pamphlets, or other written materials from your doctor or pharmacy?: 3 -  Sometimes  Interpreter Needed?: No  Information entered by :: Tyron Russell, RN   Activities of Daily Living In your present state of health, do you have any difficulty performing the following activities: 08/13/2017 05/07/2017  Hearing? N N  Vision? N Y  Difficulty concentrating or making decisions? Malvin Johns  Walking or climbing stairs? Y Y  Dressing or bathing? Y Y  Doing errands, shopping? Malvin Johns  Preparing Food and eating ? Y -  Using the Toilet? Y -  In the past six  months, have you accidently leaked urine? Y -  Do you have problems with loss of bowel control? N -  Managing your Medications? Y -  Managing your Finances? Y -  Housekeeping or managing your Housekeeping? Y -  Some recent data might be hidden     Immunizations and Health Maintenance Immunization History  Administered Date(s) Administered  . Influenza Split 12/01/2010, 03/24/2012  . Pneumococcal Conjugate-13 04/24/2014   There are no preventive care reminders to display for this patient.  Patient Care Team: Margit Hanks, MD as PCP - General (Internal Medicine)  Indicate any recent Medical Services you may have received from other than [facility] providers in the past year (date may be approximate).     Assessment:   This is a routine wellness examination for Aizlyn.  Hearing/Vision screen No exam data present  Dietary issues and exercise activities discussed: Current Exercise Habits: The patient does not participate in regular exercise at present, Exercise limited by: orthopedic condition(s);neurologic condition(s)  Goals    None     Depression Screen PHQ 2/9 Scores 08/13/2017  PHQ - 2 Score 1    Fall Risk Fall Risk  08/13/2017  Falls in the past year? Yes  Number falls in past yr: 1  Injury with Fall? Yes    Is the patient's home free of loose throw rugs in walkways, pet beds, electrical cords, etc?   yes      Grab bars in the bathroom? yes      Handrails on the stairs?   yes      Adequate  lighting?   yes  Timed Get Up and Go Performed: patient is nonambulatory  Cognitive Function:     6CIT Screen 08/13/2017  What Year? 4 points  What month? 0 points  What time? 3 points  Count back from 20 0 points  Months in reverse 4 points  Repeat phrase 10 points  Total Score 21    Screening Tests Health Maintenance  Topic Date Due  . DEXA SCAN  09/09/2017 (Originally 05/09/2002)  . PNA vac Low Risk Adult (1 of 2 - PCV13) 09/09/2017 (Originally 05/09/2002)  . TETANUS/TDAP  11/09/2017 (Originally 05/08/1954)  . INFLUENZA VACCINE  08/23/2017    Qualifies for Shingles Vaccine? Not in past records  Cancer Screenings: Lung: Low Dose CT Chest recommended if Age 21-80 years, 30 pack-year currently smoking OR have quit w/in 15years. Patient does not qualify. Breast: Up to date on Mammogram? Yes   Up to date of Bone Density/Dexa? No, ordered Colorectal: up to date  Additional Screenings:  Hepatitis C Screening: declined Pneumovax due-ordered TDAP due-ordred     Plan:    I have personally reviewed and addressed the Medicare Annual Wellness questionnaire and have noted the following in the patient's chart:  A. Medical and social history B. Use of alcohol, tobacco or illicit drugs  C. Current medications and supplements D. Functional ability and status E.  Nutritional status F.  Physical activity G. Advance directives H. List of other physicians I.  Hospitalizations, surgeries, and ER visits in previous 12 months J.  Vitals K. Screenings to include hearing, vision, cognitive, depression L. Referrals and appointments - none  In addition, I have reviewed and discussed with patient certain preventive protocols, quality metrics, and best practice recommendations. A written personalized care plan for preventive services as well as general preventive health recommendations were provided to patient.  See attached scanned questionnaire for additional information.   Signed,    Huntley Dec  Lelon Perla, RN Nurse Health Advisor  Patient Concerns: None

## 2017-08-14 ENCOUNTER — Non-Acute Institutional Stay (SKILLED_NURSING_FACILITY): Payer: Medicare Other | Admitting: Internal Medicine

## 2017-08-14 ENCOUNTER — Encounter: Payer: Self-pay | Admitting: Internal Medicine

## 2017-08-14 DIAGNOSIS — M159 Polyosteoarthritis, unspecified: Secondary | ICD-10-CM

## 2017-08-14 DIAGNOSIS — M150 Primary generalized (osteo)arthritis: Secondary | ICD-10-CM | POA: Diagnosis not present

## 2017-08-14 NOTE — Progress Notes (Signed)
:  [facility] Room Number: 305D Place of Service:  SNF (31)  [name] D. Lyn HollingsheadAlexander, MD  Patient Care Team: Margit HanksAlexander, Rateel Beldin D, MD as PCP - General (Internal Medicine)  Extended Emergency Contact Information Primary Emergency Contact: EmmaDenise Address: [address]          [address]          [place], [place] [address] [place] Mobile Phone: [phone] Relation: Daughter Secondary Emergency Contact: [name]  [place] Home Phone: [phone] Relation: Grandson     Allergies: Aspirin and Aspirin  Chief Complaint  Patient presents with  . Acute Visit    Leg Pain    HPI: Patient is 82 y.o. female who nursing asked me to see pain.  Patient states her pain is in her hips and knees.  Is worse in the a.m., gets better she was round and starts getting worse again.  Patient is already using Tylenol  Past Medical History:  Diagnosis Date  . Arthritis    hands  . Asthma   . Chronic diastolic CHF (congestive heart failure) (HCC)   . Chronic kidney disease (CKD), stage III (moderate) (HCC)   . Dementia   . Depression   . Diabetes mellitus without complication (HCC)   . Diverticulosis   . Hx of adenomatous colonic polyps   . Hyperlipidemia   . Hypertension   . Hypochromic anemia   . Internal hemorrhoids   . Stroke [facility](HCC) May 2009   multiple, last one Nov 2012, 2 strokes with right sided deficits and slurred speech  . Stroke [facility](HCC)     Past Surgical History:  Procedure Laterality Date  . ABDOMINAL HYSTERECTOMY    . ABDOMINAL SURGERY    . BOWEL RESECTION    . CHOLECYSTECTOMY N/A 09/15/2016   Procedure: LAPAROSCOPIC CHOLECYSTECTOMY WITH INTRAOPERATIVE CHOLANGIOGRAM;  Surgeon: Emma KinNewman, [name], MD;  Location: [facility] ORS;  Service: General;  Laterality: N/A;  . COLECTOMY    . ESOPHAGOGASTRODUODENOSCOPY N/A 02/01/2016   Procedure: ESOPHAGOGASTRODUODENOSCOPY (EGD);  Surgeon: Emma FiedlerJay M Pyrtle, MD;   [facility];  Service: Endoscopy;  Laterality: N/A;  . FLEXIBLE SIGMOIDOSCOPY N/A 05/28/2012   Procedure: FLEXIBLE SIGMOIDOSCOPY;  Surgeon: Emma Carwinora M Brodie, MD;  [facility];  Service: Endoscopy;  Laterality: N/A;  . OTHER SURGICAL HISTORY     2 ear surgeries  . PERIPHERAL VASCULAR CATHETERIZATION N/A 07/21/2014   Procedure: Abdominal Aortogram w/Lower Extremity;  Surgeon: Emma LibmanVance W Brabham, MD;  Location: [facility] INVASIVE CV LAB;  Service: Cardiovascular;  Laterality: N/A;  . PERIPHERAL VASCULAR CATHETERIZATION N/A 11/23/2015   Procedure: Abdominal Aortogram w/Lower Extremity;  Surgeon: Emma LibmanVance W Brabham, MD;  Location: [facility] INVASIVE CV LAB;  Service: Cardiovascular;  Laterality: N/A;  . PERIPHERAL VASCULAR CATHETERIZATION  11/23/2015   Procedure: Peripheral Vascular Intervention;  Surgeon: Emma LibmanVance W Brabham, MD;  Location: [facility] INVASIVE CV LAB;  Service: Cardiovascular;;  Failed PVI of AT and Peroneal on left side  . SUBTOTAL COLECTOMY  2010   for stricturing from bouts of diverticulitis along with hx recurrent diverticular bleeds.  the surgery was a total abdominal colec  . TONSILLECTOMY    . VASCULAR SURGERY      Allergies as of 08/14/2017      Reactions   Aspirin Nausea Only   Aspirin Nausea And Vomiting   Hurts stomach      Medication List        Accurate as of 08/14/17  1:10  PM. Always use your most recent med list.          atorvastatin 80 MG tablet Commonly known as:  LIPITOR Take 1 tablet (80 mg total) by mouth daily at 6 PM.   clopidogrel 75 MG tablet Commonly known as:  PLAVIX Take 75 mg by mouth daily.   GLUCERNA Liqd Take 237 mLs by mouth 3 (three) times daily between meals.   loratadine 10 MG tablet Commonly known as:  CLARITIN Take 10 mg by mouth daily.   losartan 50 MG tablet Commonly known as:  COZAAR Take 50 mg by mouth daily.   Melatonin 5 MG Tabs Take 1 tablet by mouth at bedtime.   ranitidine 75 MG tablet Commonly known as:  ZANTAC Take 75  mg by mouth at bedtime.   Vitamin D3 1000 units Caps Take 1,000 Units by mouth daily.       No orders of the defined types were placed in this encounter.   Immunization History  Administered Date(s) Administered  . Influenza Split 12/01/2010, 03/24/2012  . Pneumococcal Conjugate-13 04/24/2014    Social History   Tobacco Use  . Smoking status: Never Smoker  . Smokeless tobacco: Never Used  Substance Use Topics  . Alcohol use: No    Family history is   Family History  Problem Relation Age of Onset  . Diabetes Paternal Aunt   . Stroke Father   . Heart disease Father   . Diabetes Father   . Depression Father   . HIV Brother   . Prostate cancer Unknown   . Cancer Sister        unknown type  . Colon cancer Neg Hx       Review of Systems  DATA OBTAINED: from patient, nurse GENERAL:  no fevers, fatigue, appetite changes SKIN: No itching, or rash EYES: No eye pain, redness, discharge EARS: No earache, tinnitus, change in hearing NOSE: No congestion, drainage or bleeding  MOUTH/THROAT: No mouth or tooth pain, No sore throat RESPIRATORY: No cough, wheezing, SOB CARDIAC: No chest pain, palpitations, lower extremity edema  GI: No abdominal pain, No N/V/D or constipation, No heartburn or reflux  GU: No dysuria, frequency or urgency, or incontinence  MUSCULOSKELETAL: + unrelieved bone/joint pain in knees NEUROLOGIC: No headache, dizziness or focal weakness PSYCHIATRIC: No c/o anxiety or sadness   Vitals:   08/14/17 1308  BP: 139/71  Pulse: 71  Resp: 18  Temp: 98 F (36.7 C)    SpO2 Readings from Last 1 Encounters:  07/11/17 97%   Body mass index is 19.85 kg/m.     Physical Exam  GENERAL APPEARANCE: Alert, conversant,  No acute distress.  SKIN: No diaphoresis rash HEAD: Normocephalic, atraumatic  EYES: Conjunctiva/lids clear. Pupils round, reactive. EOMs intact.  EARS: External exam WNL, canals clear. Hearing grossly normal.  NOSE: No deformity or  discharge.  MOUTH/THROAT: Lips w/o lesions  RESPIRATORY: Breathing is even, unlabored. Lung sounds are clear   CARDIOVASCULAR: Heart RRR no murmurs, rubs or gallops. No peripheral edema.   GASTROINTESTINAL: Abdomen is soft, non-tender, not distended w/ normal bowel sounds. GENITOURINARY: Bladder non tender, not distended  MUSCULOSKELETAL: Some abnormal joints or musculature arthritis NEUROLOGIC:  Cranial nerves 2-12 grossly intact. Moves all extremities  PSYCHIATRIC: Mood and affect appropriate to situation, no behavioral issues  Patient Active Problem List   Diagnosis Date Noted  . GERD (gastroesophageal reflux disease) 08/03/2017  . Vitamin D deficiency 08/03/2017  . Insomnia 08/03/2017  . Allergic rhinitis 08/03/2017  .  Cerebral infarction (HCC)   . Stroke (HCC) 05/07/2017  . RUQ abdominal pain 09/11/2016  . Postural dizziness with presyncope 09/11/2016  . RUQ pain 09/11/2016  . Stroke (cerebrum) (HCC) 04/30/2016  . Hyperkalemia 04/30/2016  . Acute encephalopathy 04/30/2016  . Chronic diastolic CHF (congestive heart failure) (HCC) 04/30/2016  . Multiple duodenal ulcers   . RLQ abdominal pain   . Gallstones   . Elevated liver enzymes   . Abnormal finding on GI tract imaging   . Pancreas cyst   . Choledocholithiasis 01/30/2016  . Acute kidney injury (HCC) 01/30/2016  . Non-intractable vomiting with nausea   . Bleeding 11/23/2015  . Chronic kidney disease 05/29/2012  . Skin ulcer, stage 2 (HCC) 05/29/2012  . Internal hemorrhoid, bleeding 05/29/2012  . Severe protein-calorie malnutrition (HCC) 03/24/2012  . Hyponatremia 03/23/2012  . Hypokalemia 03/23/2012  . Urinary tract infection 03/23/2012  . Physical deconditioning 03/23/2012  . CKD (chronic kidney disease) stage 3, GFR 30-59 ml/min (HCC) 12/01/2010  . Encephalopathy 12/01/2010  . CVA (cerebral vascular accident) (HCC) 11/28/2010  . ASTHMA 08/25/2008  . COLONIC POLYPS, ADENOMATOUS, HX OF 08/25/2008  . HLD  (hyperlipidemia) 08/17/2008  . Microcytic hypochromic anemia 08/17/2008  . Hypertension 08/17/2008  . History of CVA (cerebrovascular accident) 08/17/2008  . DIVERTICULITIS, COLON 08/17/2008  . HYPERGLYCEMIA 08/17/2008      Labs reviewed: Basic Metabolic Panel:    Component Value Date/Time   NA 142 08/07/2017   K 4.5 08/07/2017   CL 107 05/08/2017 0645   CO2 23 05/08/2017 0645   GLUCOSE 74 05/08/2017 0645   BUN 30 (A) 08/07/2017   CREATININE 1.4 (A) 08/07/2017   CREATININE 1.58 (H) 05/08/2017 0645   CALCIUM 8.8 (L) 05/08/2017 0645   PROT 6.6 05/06/2017 1730   ALBUMIN 3.3 (L) 05/06/2017 1730   AST 21 08/07/2017   ALT 23 08/07/2017   ALKPHOS 67 08/07/2017   BILITOT 0.5 05/06/2017 1730   GFRNONAA 30 (L) 05/08/2017 0645   GFRAA 35 (L) 05/08/2017 0645    Recent Labs    08/30/16 1700  09/12/16 0554  03/15/17 1655 05/06/17 1701 05/06/17 1730 05/08/17 0645 08/07/17  NA 137   < > 137   < > 138 137 136 138 142  K 4.3   < > 3.8   < > 4.1 4.9 4.8 4.1 4.5  CL 105   < > 106   < > 105 105 105 107  --   CO2 24   < > 26   < > 24  --  23 23  --   GLUCOSE 186*   < > 87   < > 178* 180* 179* 74  --   BUN 24*   < > 20   < > 33* 25* 24* 21* 30*  CREATININE 1.74*   < > 1.53*   < > 1.61* 1.70* 1.80* 1.58* 1.4*  CALCIUM 8.7*   < > 8.8*   < > 8.9  --  9.1 8.8*  --   MG 2.2  --  2.0  --  2.3  --   --   --   --   PHOS  --   --  3.4  --   --   --   --   --   --    < > = values in this interval not displayed.   Liver Function Tests: Recent Labs    09/16/16 0540 03/15/17 1655 05/06/17 1730 08/07/17  AST 45* 23 24 21  ALT 29 13* 14 23  ALKPHOS 44 69 50 67  BILITOT 0.3 0.4 0.5  --   PROT 5.4* 6.5 6.6  --   ALBUMIN 2.6* 3.5 3.3*  --    Recent Labs    09/11/16 1538  LIPASE 37   No results for input(s): AMMONIA in the last 8760 hours. CBC: Recent Labs    09/11/16 1538  09/16/16 0540 03/15/17 1655 05/06/17 1701 05/06/17 1730 08/07/17  WBC 4.7   < > 7.5 4.8  --  4.8 5.1    NEUTROABS 3.6  --   --  3.7  --  3.7  --   HGB 10.5*   < > 9.2* 11.6* 12.2 11.4* 10.6*  HCT 31.8*   < > 27.7* 36.4 36.0 36.1 32*  MCV 86.2   < > 85.0 87.9  --  87.6  --   PLT 200   < > 164 198  --  199 177   < > = values in this interval not displayed.   Lipid Recent Labs    05/07/17 0327 08/07/17  CHOL 226* 145  HDL 70 66  LDLCALC 145* 63  TRIG 56 76    Cardiac Enzymes: Recent Labs    09/11/16 2306 09/12/16 0554  TROPONINI <0.03 <0.03   BNP: No results for input(s): BNP in the last 8760 hours. No results found for: [facility] Lab Results  Component Value Date   HGBA1C 5.6 08/07/2017   Lab Results  Component Value Date   TSH 1.79 08/07/2017   Lab Results  Component Value Date   VITAMINB12 573 08/07/2017   Lab Results  Component Value Date   FOLATE 12.7 05/28/2012   Lab Results  Component Value Date   IRON 63 08/07/2017   TIBC 195 08/07/2017   FERRITIN 470.70 08/07/2017    Imaging and Procedures obtained prior to SNF admission: Dg Op Swallowing Func-medicare/speech Path  Result Date: 05/21/2017 CLINICAL DATA:  82 year old female with history of dysphagia. EXAM: MODIFIED BARIUM SWALLOW TECHNIQUE: Different consistencies of barium were administered orally to the patient by the Speech Pathologist. Imaging of the pharynx was performed in the lateral projection. FLUOROSCOPY TIME:  Fluoroscopy Time:  58 seconds COMPARISON:  None. FINDINGS: Thin liquid-Delayed oral transit, premature spill over and delayed swallow trigger. Otherwise, within normal limits. Pure- Delayed oral transit, premature spill over and delayed swallow trigger. Otherwise, within normal limits. Pure with cracker- Delayed oral transit, premature spill over and delayed swallow trigger. Otherwise, within normal limits. IMPRESSION: Abnormal swallow study, as above. No penetration or aspiration noted. Please refer to the Speech Pathologists report for complete details and recommendations. Electronically  Signed   By: Trudie Reed M.D.   On: 05/21/2017 13:18     Not all labs, radiology exams or other studies done during hospitalization come through on my EPIC note; however they are reviewed by me.    Assessment and Plan  Osteoarthritis arthritis in hips and knees- we will start Ultram 25 mg twice daily scheduled; monitor result   Thurston Hole D. [name], MD

## 2017-09-03 ENCOUNTER — Encounter: Payer: Self-pay | Admitting: Internal Medicine

## 2017-09-03 NOTE — Assessment & Plan Note (Signed)
Controlled; continue Cozaar 50 mg daily

## 2017-09-03 NOTE — Assessment & Plan Note (Addendum)
Chronic and stable; continue Plavix 75 mg daily: Patient is on statin and lipids are well controlled: Patient is not a diabetic

## 2017-09-03 NOTE — Assessment & Plan Note (Signed)
LDL 63, HDL 66; good control on Lipitor 80 mg daily; continue Lipitor

## 2017-09-04 ENCOUNTER — Encounter: Payer: Self-pay | Admitting: Internal Medicine

## 2017-09-13 ENCOUNTER — Non-Acute Institutional Stay (SKILLED_NURSING_FACILITY): Payer: Medicare Other | Admitting: Internal Medicine

## 2017-09-13 ENCOUNTER — Encounter: Payer: Self-pay | Admitting: Internal Medicine

## 2017-09-13 DIAGNOSIS — R600 Localized edema: Secondary | ICD-10-CM

## 2017-09-13 DIAGNOSIS — I471 Supraventricular tachycardia: Secondary | ICD-10-CM | POA: Diagnosis not present

## 2017-09-13 NOTE — Progress Notes (Signed)
:  [facility] Room Number: 305D Place of Service:  SNF (31)  Rahiem Schellinger D. Lyn HollingsheadAlexander, MD  Patient Care Team: Margit HanksAlexander, Jaray Boliver D, MD as PCP - General (Internal Medicine)  Extended Emergency Contact Information Primary Emergency Contact: Hovis,[name] Address: [address]          [address]          [place], [place] [address] [place] Mobile Phone: [phone] Relation: Daughter Secondary Emergency Contact: [name],[name]  [place] Home Phone: [phone] Relation: Grandson     Allergies: Aspirin and Aspirin  Chief Complaint  Patient presents with  . Acute Visit    Tachycardia    HPI: Patient is 82 y.o. female who nursing asked me to see for tachycardia noted on routine vital signs.  Patient has no symptoms, was unaware of her heart rate no chest pain no shortness of breath no dizziness no nausea.  No history of prior.  Nothing changes it nothing, nothing is making it better or worse.  Past Medical History:  Diagnosis Date  . Arthritis    hands  . Asthma   . Chronic diastolic CHF (congestive heart failure) (HCC)   . Chronic kidney disease (CKD), stage III (moderate) (HCC)   . Dementia   . Depression   . Diabetes mellitus without complication (HCC)   . Diverticulosis   . Hx of adenomatous colonic polyps   . Hyperlipidemia   . Hypertension   . Hypochromic anemia   . Internal hemorrhoids   . Stroke [facility](HCC) May 2009   multiple, last one Nov 2012, 2 strokes with right sided deficits and slurred speech  . Stroke [facility](HCC)     Past Surgical History:  Procedure Laterality Date  . ABDOMINAL HYSTERECTOMY    . ABDOMINAL SURGERY    . BOWEL RESECTION    . CHOLECYSTECTOMY N/A 09/15/2016   Procedure: LAPAROSCOPIC CHOLECYSTECTOMY WITH INTRAOPERATIVE CHOLANGIOGRAM;  Surgeon: [name], [name], MD;  Location: [facility] ORS;  Service: General;  Laterality: N/A;  . COLECTOMY    . ESOPHAGOGASTRODUODENOSCOPY N/A 02/01/2016     Procedure: ESOPHAGOGASTRODUODENOSCOPY (EGD);  Surgeon: [name], MD;  [facility];  Service: Endoscopy;  Laterality: N/A;  . FLEXIBLE SIGMOIDOSCOPY N/A 05/28/2012   Procedure: FLEXIBLE SIGMOIDOSCOPY;  Surgeon: [name], MD;  [facility];  Service: Endoscopy;  Laterality: N/A;  . OTHER SURGICAL HISTORY     2 ear surgeries  . PERIPHERAL VASCULAR CATHETERIZATION N/A 07/21/2014   Procedure: Abdominal Aortogram w/Lower Extremity;  Surgeon: [name], MD;  Location: [facility] INVASIVE CV LAB;  Service: Cardiovascular;  Laterality: N/A;  . PERIPHERAL VASCULAR CATHETERIZATION N/A 11/23/2015   Procedure: Abdominal Aortogram w/Lower Extremity;  Surgeon: [name], MD;  Location: [facility] INV[facility] CV LAB;  Service: Cardiovascular;  Laterality: N/A;  . PERIPHERAL VASCULAR CATHETERIZATION  11/23/2015   Procedure: Peripheral Vascular Intervention;  Surgeon: [name], MD;  Location: [facility] CV LAB;  Service: Cardiovascular;;  Failed PVI of AT and Peroneal on left side  . SUBTOTAL COLECTOMY  2010   for stricturing from bouts of diverticulitis along with hx recurrent diverticular bleeds.  the surgery was a total abdominal colec  . TONSILLECTOMY    . VASCULAR SURGERY      Allergies as of 09/13/2017      Reactions   Aspirin Nausea Only   Aspirin Nausea And Vomiting   Hurts stomach      Medication List  Accurate as of 09/13/17  3:17 PM. Always use your most recent med list.          acetaminophen 325 MG tablet Commonly known as:  TYLENOL Take 650 mg by mouth every 6 (six) hours as needed for moderate pain.   atorvastatin 80 MG tablet Commonly known as:  LIPITOR Take 1 tablet (80 mg total) by mouth daily at 6 PM.   clopidogrel 75 MG tablet Commonly known as:  PLAVIX Take 75 mg by mouth daily.   GLUCERNA Liqd Take 237 mLs by mouth 3 (three) times daily between meals.   loratadine 10 MG tablet Commonly known as:  CLARITIN Take 10 mg by mouth  daily.   losartan 50 MG tablet Commonly known as:  COZAAR Take 50 mg by mouth daily.   Melatonin 5 MG Tabs Take 1 tablet by mouth at bedtime.   ranitidine 75 MG tablet Commonly known as:  ZANTAC Take 75 mg by mouth at bedtime.   Vitamin D3 1000 units Caps Take 1,000 Units by mouth daily.       No orders of the defined types were placed in this encounter.   Immunization History  Administered Date(s) Administered  . Influenza Split 12/01/2010, 03/24/2012  . Pneumococcal Conjugate-13 04/24/2014    Social History   Tobacco Use  . Smoking status: Never Smoker  . Smokeless tobacco: Never Used  Substance Use Topics  . Alcohol use: No    Family history is   Family History  Problem Relation Age of Onset  . Diabetes Paternal Aunt   . Stroke Father   . Heart disease Father   . Diabetes Father   . Depression Father   . HIV Brother   . Prostate cancer Unknown   . Cancer Sister        unknown type  . Colon cancer Neg Hx       Review of Systems  DATA OBTAINED: from patient-limited; nursing as per history of present illness GENERAL:  no fevers, fatigue, appetite changes SKIN: No itching, or rash EYES: No eye pain, redness, discharge EARS: No earache, tinnitus, change in hearing NOSE: No congestion, drainage or bleeding  MOUTH/THROAT: No mouth or tooth pain, No sore throat RESPIRATORY: No cough, wheezing, SOB CARDIAC: No chest pain, palpitations,+ lower extremity edema  GI: No abdominal pain, No N/V/D or constipation, No heartburn or reflux  GU: No dysuria, frequency or urgency, or incontinence  MUSCULOSKELETAL: No unrelieved bone/joint pain NEUROLOGIC: No headache, dizziness or focal weakness PSYCHIATRIC: No c/o anxiety or sadness   Vitals:   09/13/17 1506  BP: 111/77  Pulse: 66  Resp: 17  Temp: (!) 96.8 F (36 C)    SpO2 Readings from Last 1 Encounters:  07/11/17 97%   Body mass index is 20.98 kg/m.     Physical Exam  GENERAL APPEARANCE:  Alert, conversant,  No acute distress.  SKIN: No diaphoresis rash HEAD: Normocephalic, atraumatic  EYES: Conjunctiva/lids clear. Pupils round, reactive. EOMs intact.  EARS: External exam WNL, canals clear. Hearing grossly normal.  NOSE: No deformity or discharge.  MOUTH/THROAT: Lips w/o lesions  RESPIRATORY: Breathing is even, unlabored. Lung sounds are clear   CARDIOVASCULAR: Heart ocular, rapid, no murmurs, rubs or gallops.  2+ peripheral edema on right, 1+ on left, left leg is chronically larger than the right leg.   GASTROINTESTINAL: Abdomen is soft, non-tender, not distended w/ normal bowel sounds. GENITOURINARY: Bladder non tender, not distended  MUSCULOSKELETAL: No abnormal joints or musculature NEUROLOGIC:  Cranial  nerves 2-12 grossly intact. Moves all extremities  PSYCHIATRIC: Mood and affect appropriate to situation dementia, no behavioral issues  Patient Active Problem List   Diagnosis Date Noted  . GERD (gastroesophageal reflux disease) 08/03/2017  . Vitamin D deficiency 08/03/2017  . Insomnia 08/03/2017  . Allergic rhinitis 08/03/2017  . Cerebral infarction (HCC)   . Stroke (HCC) 05/07/2017  . RUQ abdominal pain 09/11/2016  . Postural dizziness with presyncope 09/11/2016  . RUQ pain 09/11/2016  . Stroke (cerebrum) (HCC) 04/30/2016  . Hyperkalemia 04/30/2016  . Acute encephalopathy 04/30/2016  . Chronic diastolic CHF (congestive heart failure) (HCC) 04/30/2016  . Multiple duodenal ulcers   . RLQ abdominal pain   . Gallstones   . Elevated liver enzymes   . Abnormal finding on GI tract imaging   . Pancreas cyst   . Choledocholithiasis 01/30/2016  . Acute kidney injury (HCC) 01/30/2016  . Non-intractable vomiting with nausea   . Bleeding 11/23/2015  . Chronic kidney disease 05/29/2012  . Skin ulcer, stage 2 (HCC) 05/29/2012  . Internal hemorrhoid, bleeding 05/29/2012  . Severe protein-calorie malnutrition (HCC) 03/24/2012  . Hyponatremia 03/23/2012  . Hypokalemia  03/23/2012  . Urinary tract infection 03/23/2012  . Physical deconditioning 03/23/2012  . CKD (chronic kidney disease) stage 3, GFR 30-59 ml/min (HCC) 12/01/2010  . Encephalopathy 12/01/2010  . CVA (cerebral vascular accident) (HCC) 11/28/2010  . ASTHMA 08/25/2008  . COLONIC POLYPS, ADENOMATOUS, HX OF 08/25/2008  . HLD (hyperlipidemia) 08/17/2008  . Microcytic hypochromic anemia 08/17/2008  . Hypertension 08/17/2008  . History of CVA (cerebrovascular accident) 08/17/2008  . DIVERTICULITIS, COLON 08/17/2008  . HYPERGLYCEMIA 08/17/2008      Labs reviewed: Basic Metabolic Panel:    Component Value Date/Time   NA 142 08/07/2017   K 4.5 08/07/2017   CL 107 05/08/2017 0645   CO2 23 05/08/2017 0645   GLUCOSE 74 05/08/2017 0645   BUN 30 (A) 08/07/2017   CREATININE 1.4 (A) 08/07/2017   CREATININE 1.58 (H) 05/08/2017 0645   CALCIUM 8.8 (L) 05/08/2017 0645   PROT 6.6 05/06/2017 1730   ALBUMIN 3.3 (L) 05/06/2017 1730   AST 21 08/07/2017   ALT 23 08/07/2017   ALKPHOS 67 08/07/2017   BILITOT 0.5 05/06/2017 1730   GFRNONAA 30 (L) 05/08/2017 0645   GFRAA 35 (L) 05/08/2017 0645    Recent Labs    03/15/17 1655 05/06/17 1701 05/06/17 1730 05/08/17 0645 08/07/17  NA 138 137 136 138 142  K 4.1 4.9 4.8 4.1 4.5  CL 105 105 105 107  --   CO2 24  --  23 23  --   GLUCOSE 178* 180* 179* 74  --   BUN 33* 25* 24* 21* 30*  CREATININE 1.61* 1.70* 1.80* 1.58* 1.4*  CALCIUM 8.9  --  9.1 8.8*  --   MG 2.3  --   --   --   --    Liver Function Tests: Recent Labs    09/16/16 0540 03/15/17 1655 05/06/17 1730 08/07/17  AST 45* 23 24 21   ALT 29 13* 14 23  ALKPHOS 44 69 50 67  BILITOT 0.3 0.4 0.5  --   PROT 5.4* 6.5 6.6  --   ALBUMIN 2.6* 3.5 3.3*  --    No results for input(s): LIPASE, AMYLASE in the last 8760 hours. No results for input(s): AMMONIA in the last 8760 hours. CBC: Recent Labs    09/16/16 0540 03/15/17 1655 05/06/17 1701 05/06/17 1730 08/07/17  WBC 7.5 4.8  --  4.8  5.1  NEUTROABS  --  3.7  --  3.7  --   HGB 9.2* 11.6* 12.2 11.4* 10.6*  HCT 27.7* 36.4 36.0 36.1 32*  MCV 85.0 87.9  --  87.6  --   PLT 164 198  --  199 177   Lipid Recent Labs    05/07/17 0327 08/07/17  CHOL 226* 145  HDL 70 66  LDLCALC 145* 63  TRIG 56 76    Cardiac Enzymes: No results for input(s): CKTOTAL, CKMB, CKMBINDEX, TROPONINI in the last 8760 hours. BNP: No results for input(s): BNP in the last 8760 hours. No results found for: [facility] Lab Results  Component Value Date   HGBA1C 5.6 08/07/2017   Lab Results  Component Value Date   TSH 1.79 08/07/2017   Lab Results  Component Value Date   VITAMINB12 573 08/07/2017   Lab Results  Component Value Date   FOLATE 12.7 05/28/2012   Lab Results  Component Value Date   IRON 63 08/07/2017   TIBC 195 08/07/2017   FERRITIN 470.70 08/07/2017    Imaging and Procedures obtained prior to SNF admission: Dg Op Swallowing Func-medicare/speech Path  Result Date: 05/21/2017 CLINICAL DATA:  82 year old female with history of dysphagia. EXAM: MODIFIED BARIUM SWALLOW TECHNIQUE: Different consistencies of barium were administered orally to the patient by the Speech Pathologist. Imaging of the pharynx was performed in the lateral projection. FLUOROSCOPY TIME:  Fluoroscopy Time:  58 seconds COMPARISON:  None. FINDINGS: Thin liquid-Delayed oral transit, premature spill over and delayed swallow trigger. Otherwise, within normal limits. Pure- Delayed oral transit, premature spill over and delayed swallow trigger. Otherwise, within normal limits. Pure with cracker- Delayed oral transit, premature spill over and delayed swallow trigger. Otherwise, within normal limits. IMPRESSION: Abnormal swallow study, as above. No penetration or aspiration noted. Please refer to the Speech Pathologists report for complete details and recommendations. Electronically Signed   By: Trudie Reed M.D.   On: 05/21/2017 13:18     Not all labs,  radiology exams or other studies done during hospitalization come through on my EPIC note; however they are reviewed by me.    Assessment and Plan  Atrial tachycardia-EKG was done which showed atrial tachycardia, PR interval was at the upper limit of normal, P waves not clearly seen, it was regular.  Patient without symptoms, O2 saturations 99% on room air at the bedside so this can be treated here.  Will start patient on metoprolol 12.5 twice daily, will increase it to 25mg  BID if  Blood Pressure  allows; DC her current blood pressure medicine  Cozaar: Patient may need diuretic but will take care of her heart rate first   Spent greater than 35 minutes Sheena Donegan D. [name], MD

## 2017-09-15 ENCOUNTER — Encounter: Payer: Self-pay | Admitting: Internal Medicine

## 2017-09-17 ENCOUNTER — Non-Acute Institutional Stay (SKILLED_NURSING_FACILITY): Payer: Medicare Other | Admitting: Internal Medicine

## 2017-09-17 ENCOUNTER — Encounter: Payer: Self-pay | Admitting: Internal Medicine

## 2017-09-17 DIAGNOSIS — N183 Chronic kidney disease, stage 3 unspecified: Secondary | ICD-10-CM

## 2017-09-17 DIAGNOSIS — R7303 Prediabetes: Secondary | ICD-10-CM | POA: Diagnosis not present

## 2017-09-17 DIAGNOSIS — I63031 Cerebral infarction due to thrombosis of right carotid artery: Secondary | ICD-10-CM

## 2017-09-17 NOTE — Progress Notes (Signed)
Opened in error

## 2017-09-17 NOTE — Progress Notes (Signed)
Location[facility] Room Number: 305D Place of Service:  SNF (31)  [name]. [name], MD  Patient Care Team: Margit Hanks, MD as PCP - General (Internal Medicine)  Extended Emergency Contact Information Primary Emergency Contact: Leys,[name] Address: [address]          [place], [place] [address] [name] Phone: [phone] Relation: Daughter Secondary Emergency Contact: [name] [place] Home Phone: [phone] Relation: Grandson    Allergies: Aspirin and Aspirin  Chief Complaint  Patient presents with  . Medical Management of Chronic Issues    Routine Visit    HPI: Patient is 82 y.o. female who is being seen for routine issues of chronic kidney disease stage III, history of CVA, and prediabetes.  Past Medical History:  Diagnosis Date  . Arthritis    hands  . Asthma   . Chronic diastolic CHF (congestive heart failure) (HCC)   . Chronic kidney disease (CKD), stage III (moderate) (HCC)   . Dementia   . Depression   . Diabetes mellitus without complication (HCC)   . Diverticulosis   . Hx of adenomatous colonic polyps   . Hyperlipidemia   . Hypertension   . Hypochromic anemia   . Internal hemorrhoids   . Stroke [facility]) May 2009   multiple, last one Nov 2012, 2 strokes with right sided deficits and slurred speech  . Stroke [facility])     Past Surgical History:  Procedure Laterality Date  . ABDOMINAL HYSTERECTOMY    . ABDOMINAL SURGERY    . BOWEL RESECTION    . CHOLECYSTECTOMY N/A 09/15/2016   Procedure: LAPAROSCOPIC CHOLECYSTECTOMY WITH INTRAOPERATIVE CHOLANGIOGRAM;  Surgeon: Ovidio Kin, MD;  Location: [facility] ORS;  Service: General;  Laterality: N/A;  . COLECTOMY    . ESOPHAGOGASTRODUODENOSCOPY N/A 02/01/2016   Procedure: ESOPHAGOGASTRODUODENOSCOPY (EGD);  Surgeon: Beverley Fiedler, MD;  Location: [facility] ENDOSCOPY;  Service: Endoscopy;  Laterality: N/A;  .  FLEXIBLE SIGMOIDOSCOPY N/A 05/28/2012   Procedure: FLEXIBLE SIGMOIDOSCOPY;  Surgeon: Hart Carwin, MD;  Location: [facility] END[facility]COPY;  Service: Endoscopy;  Laterality: N/A;  . OTHER SURGICAL HISTORY     2 ear surgeries  . PERIPHERAL VASCULAR CATHETERIZATION N/A 07/21/2014   Procedure: Abdominal Aortogram w/Lower Extremity;  Surgeon: Nada Libman, MD;  [facility];  Service: Cardiovascular;  Laterality: N/A;  . PERIPHERAL VASCULAR CATHETERIZATION N/A 11/23/2015   Procedure: Abdominal Aortogram w/Lower Extremity;  Surgeon: Nada Libman, MD;  [facility]: [facility] CV LAB;  Service: Cardiovascular;  Laterality: N/A;  . PERIPHERAL VASCULAR CATHETERIZATION  11/23/2015   Procedure: Peripheral Vascular Intervention;  Surgeon: Nada Libman, MD;  [facility]: [facility] CV LAB;  Service: Cardiovascular;;  Failed PVI of AT and Peroneal on left side  . SUBTOTAL COLECTOMY  2010   for stricturing from bouts of diverticulitis along with hx recurrent diverticular bleeds.  the surgery was a total abdominal colec  . TONSILLECTOMY    . VASCULAR SURGERY      Allergies as of 09/17/2017      Reactions   Aspirin Nausea Only   Aspirin Nausea And Vomiting   Hurts stomach      Medication List        Accurate as of 09/17/17 11:59 PM. Always use your most recent med list.          acetaminophen 325 MG tablet Commonly  known as:  TYLENOL Take 650 mg by mouth every 6 (six) hours as needed for moderate pain.   atorvastatin 80 MG tablet Commonly known as:  LIPITOR Take 1 tablet (80 mg total) by mouth daily at 6 PM.   clopidogrel 75 MG tablet Commonly known as:  PLAVIX Take 75 mg by mouth daily.   GLUCERNA Liqd Take 237 mLs by mouth 3 (three) times daily between meals.   loratadine 10 MG tablet Commonly known as:  CLARITIN Take 10 mg by mouth daily.   Melatonin 5 MG Tabs Take 1 tablet by mouth at bedtime.   METOPROLOL TARTRATE PO Take 25 mg by mouth. TAKE 1/2 TABLET (12.5MG ) BY  MOUTH TWICE A DAY FOR TACHYCARDIA   ranitidine 75 MG tablet Commonly known as:  ZANTAC Take 75 mg by mouth at bedtime.   Vitamin D3 1000 units Caps Take 1,000 Units by mouth daily.       No orders of the defined types were placed in this encounter.   Immunization History  Administered Date(s) Administered  . Influenza Split 12/01/2010, 03/24/2012  . Pneumococcal Conjugate-13 04/24/2014    Social History   Tobacco Use  . Smoking status: Never Smoker  . Smokeless tobacco: Never Used  Substance Use Topics  . Alcohol use: No    Review of Systems  DATA OBTAINED: from patient, nurse GENERAL:  no fevers, fatigue, appetite changes SKIN: No itching, rash HEENT: No complaint RESPIRATORY: No cough, wheezing, SOB CARDIAC: No chest pain, palpitations, lower extremity edema  GI: No abdominal pain, No N/V/D or constipation, No heartburn or reflux  GU: No dysuria, frequency or urgency, or incontinence  MUSCULOSKELETAL: No unrelieved bone/joint pain NEUROLOGIC: No headache, dizziness  PSYCHIATRIC: No overt anxiety or sadness  Vitals:   09/17/17 1237  BP: 115/62  Pulse: 62  Resp: 18  Temp: (!) 96.8 F (36 C)   Body mass index is 20.98 kg/m. Physical Exam  GENERAL APPEARANCE: Alert, conversant, No acute distress  SKIN: No diaphoresis rash HEENT: Unremarkable RESPIRATORY: Breathing is even, unlabored. Lung sounds are clear   CARDIOVASCULAR: Heart RRR no murmurs, rubs or gallops. No peripheral edema  GASTROINTESTINAL: Abdomen is soft, non-tender, not distended w/ normal bowel sounds.  GENITOURINARY: Bladder non tender, not distended  MUSCULOSKELETAL: No abnormal joints or musculature NEUROLOGIC: Cranial nerves 2-12 grossly intact; right hemiplegia PSYCHIATRIC: Mood and affect appropriate to situation, no behavioral issues  Patient Active Problem List   Diagnosis Date Noted  . Prediabetes 09/19/2017  . GERD (gastroesophageal reflux disease) 08/03/2017  . Vitamin D  deficiency 08/03/2017  . Insomnia 08/03/2017  . Allergic rhinitis 08/03/2017  . Cerebral infarction (HCC)   . Stroke (HCC) 05/07/2017  . RUQ abdominal pain 09/11/2016  . Postural dizziness with presyncope 09/11/2016  . RUQ pain 09/11/2016  . Stroke (cerebrum) (HCC) 04/30/2016  . Hyperkalemia 04/30/2016  . Acute encephalopathy 04/30/2016  . Chronic diastolic CHF (congestive heart failure) (HCC) 04/30/2016  . Multiple duodenal ulcers   . RLQ abdominal pain   . Gallstones   . Elevated liver enzymes   . Abnormal finding on GI tract imaging   . Pancreas cyst   . Choledocholithiasis 01/30/2016  . Acute kidney injury (HCC) 01/30/2016  . Non-intractable vomiting with nausea   . Bleeding 11/23/2015  . Chronic kidney disease 05/29/2012  . Skin ulcer, stage 2 (HCC) 05/29/2012  . Internal hemorrhoid, bleeding 05/29/2012  . Severe protein-calorie malnutrition (HCC) 03/24/2012  . Hyponatremia 03/23/2012  . Hypokalemia 03/23/2012  . Urinary  tract infection 03/23/2012  . Physical deconditioning 03/23/2012  . CKD (chronic kidney disease) stage 3, GFR 30-59 ml/min (HCC) 12/01/2010  . Encephalopathy 12/01/2010  . CVA (cerebral vascular accident) (HCC) 11/28/2010  . ASTHMA 08/25/2008  . COLONIC POLYPS, ADENOMATOUS, HX OF 08/25/2008  . HLD (hyperlipidemia) 08/17/2008  . Microcytic hypochromic anemia 08/17/2008  . Hypertension 08/17/2008  . History of CVA (cerebrovascular accident) 08/17/2008  . DIVERTICULITIS, COLON 08/17/2008  . HYPERGLYCEMIA 08/17/2008    CMP     Component Value Date/Time   NA 142 08/07/2017   K 4.5 08/07/2017   CL 107 05/08/2017 0645   CO2 23 05/08/2017 0645   GLUCOSE 74 05/08/2017 0645   BUN 30 (A) 08/07/2017   CREATININE 1.4 (A) 08/07/2017   CREATININE 1.58 (H) 05/08/2017 0645   CALCIUM 8.8 (L) 05/08/2017 0645   PROT 6.6 05/06/2017 1730   ALBUMIN 3.3 (L) 05/06/2017 1730   AST 21 08/07/2017   ALT 23 08/07/2017   ALKPHOS 67 08/07/2017   BILITOT 0.5  05/06/2017 1730   GFRNONAA 30 (L) 05/08/2017 0645   GFRAA 35 (L) 05/08/2017 0645   Recent Labs    03/15/17 1655 05/06/17 1701 05/06/17 1730 05/08/17 0645 08/07/17  NA 138 137 136 138 142  K 4.1 4.9 4.8 4.1 4.5  CL 105 105 105 107  --   CO2 24  --  23 23  --   GLUCOSE 178* 180* 179* 74  --   BUN 33* 25* 24* 21* 30*  CREATININE 1.61* 1.70* 1.80* 1.58* 1.4*  CALCIUM 8.9  --  9.1 8.8*  --   MG 2.3  --   --   --   --    Recent Labs    03/15/17 1655 05/06/17 1730 08/07/17  AST 23 24 21   ALT 13* 14 23  ALKPHOS 69 50 67  BILITOT 0.4 0.5  --   PROT 6.5 6.6  --   ALBUMIN 3.5 3.3*  --    Recent Labs    03/15/17 1655 05/06/17 1701 05/06/17 1730 08/07/17  WBC 4.8  --  4.8 5.1  NEUTROABS 3.7  --  3.7  --   HGB 11.6* 12.2 11.4* 10.6*  HCT 36.4 36.0 36.1 32*  MCV 87.9  --  87.6  --   PLT 198  --  199 177   Recent Labs    05/07/17 0327 08/07/17  CHOL 226* 145  LDLCALC 145* 63  TRIG 56 76   [facility]: [facility] Lab Results  Component Value Date   TSH 1.79 08/07/2017   Lab Results  Component Value Date   HGBA1C 5.6 08/07/2017   Lab Results  Component Value Date   CHOL 145 08/07/2017   HDL 66 08/07/2017   LDLCALC 63 08/07/2017   TRIG 76 08/07/2017   CHOLHDL 3.2 05/07/2017    Significant Diagnostic Results in last 30 days:  No results found.  Assessment and Plan  CKD (chronic kidney disease) stage 3, GFR 30-59 ml/min (HCC) Recent GFR 30; creatinine 1.4 which is improved from prior; will monitor at intervals  CVA (cerebral vascular accident) (HCC) Chronic and stable; continue Plavix 75 mg daily; patient is on statin  Prediabetes A1c is 5.7, on diet alone; lipids are well controlled, patient is on statin; will monitor at intervals   Thurston HoleAnne D. Lyn HollingsheadAlexander, MD

## 2017-09-19 ENCOUNTER — Encounter: Payer: Self-pay | Admitting: Internal Medicine

## 2017-09-19 DIAGNOSIS — R7303 Prediabetes: Secondary | ICD-10-CM | POA: Insufficient documentation

## 2017-09-19 NOTE — Assessment & Plan Note (Signed)
Chronic and stable; continue Plavix 75 mg daily; patient is on statin

## 2017-09-19 NOTE — Assessment & Plan Note (Signed)
A1c is 5.7, on diet alone; lipids are well controlled, patient is on statin; will monitor at intervals

## 2017-09-19 NOTE — Assessment & Plan Note (Signed)
Recent GFR 30; creatinine 1.4 which is improved from prior; will monitor at intervals

## 2017-10-15 ENCOUNTER — Non-Acute Institutional Stay (SKILLED_NURSING_FACILITY): Payer: Medicare Other | Admitting: Internal Medicine

## 2017-10-15 ENCOUNTER — Encounter: Payer: Self-pay | Admitting: Internal Medicine

## 2017-10-15 DIAGNOSIS — K219 Gastro-esophageal reflux disease without esophagitis: Secondary | ICD-10-CM | POA: Diagnosis not present

## 2017-10-15 DIAGNOSIS — F5101 Primary insomnia: Secondary | ICD-10-CM

## 2017-10-15 DIAGNOSIS — E559 Vitamin D deficiency, unspecified: Secondary | ICD-10-CM | POA: Diagnosis not present

## 2017-10-15 NOTE — Progress Notes (Signed)
[facility] Room Number: 305D Place of Service:  SNF (31)  Randon Goldsmithnne D. Lyn HollingsheadAlexander, MD  Patient Care Team: Margit HanksAlexander, [name], MD as PCP - General (Internal Medicine)  Extended Emergency Contact Information Primary Emergency Contact: [name],[name] Address: [address]          [address]          [place], [place] [address] [place] Mobile Phone: [phone] Relation: Daughter Secondary Emergency Contact: [name],[name]  [place] Home Phone: [phone] Relation: Grandson    Allergies: Aspirin and Aspirin  Chief Complaint  Patient presents with  . Medical Management of Chronic Issues    Routine Visit    HPI: Patient is 82 y.o. female who is being seen for routine issues of GERD, vitamin D deficiency, and insomnia.  Past Medical History:  Diagnosis Date  . Arthritis    hands  . Asthma   . Chronic diastolic CHF (congestive heart failure) (HCC)   . Chronic kidney disease (CKD), stage III (moderate) (HCC)   . Dementia   . Depression   . Diabetes mellitus without complication (HCC)   . Diverticulosis   . Hx of adenomatous colonic polyps   . Hyperlipidemia   . Hypertension   . Hypochromic anemia   . Internal hemorrhoids   . Stroke [facility](HCC) May 2009   multiple, last one Nov 2012, 2 strokes with right sided deficits and slurred speech  . Stroke [facility](HCC)     Past Surgical History:  Procedure Laterality Date  . ABDOMINAL HYSTERECTOMY    . ABDOMINAL SURGERY    . BOWEL RESECTION    . CHOLECYSTECTOMY N/A 09/15/2016   Procedure: LAPAROSCOPIC CHOLECYSTECTOMY WITH INTRAOPERATIVE CHOLANGIOGRAM;  Surgeon: [name], [name], MD;  Location: [facility] ORS;  Service: General;  Laterality: N/A;  . COLECTOMY    . ESOPHAGOGASTRODUODENOSCOPY N/A 02/01/2016   Procedure: ESOPHAGOGASTRODUODENOSCOPY (EGD);  Surgeon: [name], MD;  [facility];  Service: Endoscopy;  Laterality: N/A;  . FLEXIBLE SIGMOIDOSCOPY N/A  05/28/2012   Procedure: FLEXIBLE SIGMOIDOSCOPY;  Surgeon: [name], MD;  [facility];  Service: Endoscopy;  Laterality: N/A;  . OTHER SURGICAL HISTORY     2 ear surgeries  . PERIPHERAL VASCULAR CATHETERIZATION N/A 07/21/2014   Procedure: Abdominal Aortogram w/Lower Extremity;  Surgeon: [name], MD;  Location: [facility] INVASIVE CV LAB;  Service: Cardiovascular;  Laterality: N/A;  . PERIPHERAL VASCULAR CATHETERIZATION N/A 11/23/2015   Procedure: Abdominal Aortogram w/Lower Extremity;  Surgeon: [name], MD;  Location: [facility] INVASIVE CV LAB;  Service: Cardiovascular;  Laterality: N/A;  . PERIPHERAL VASCULAR CATHETERIZATION  11/23/2015   Procedure: Peripheral Vascular Intervention;  Surgeon: [name], MD;  [facility]: [facility] CV LAB;  Service: Cardiovascular;;  Failed PVI of AT and Peroneal on left side  . SUBTOTAL COLECTOMY  2010   for stricturing from bouts of diverticulitis along with hx recurrent diverticular bleeds.  the surgery was a total abdominal colec  . TONSILLECTOMY    . VASCULAR SURGERY      Allergies as of 10/15/2017      Reactions   Aspirin Nausea Only   Aspirin Nausea And Vomiting   Hurts stomach      Medication List        Accurate as of 10/15/17 11:59 PM. Always use your most recent med list.          acetaminophen 325 MG tablet Commonly known as:  TYLENOL  Take 650 mg by mouth every 6 (six) hours as needed for moderate pain.   atorvastatin 80 MG tablet Commonly known as:  LIPITOR Take 1 tablet (80 mg total) by mouth daily at 6 PM.   busPIRone 5 MG tablet Commonly known as:  BUSPAR Take 5 mg by mouth 2 (two) times daily.   clopidogrel 75 MG tablet Commonly known as:  PLAVIX Take 75 mg by mouth daily.   GLUCERNA Liqd Take 237 mLs by mouth 3 (three) times daily between meals.   loratadine 10 MG tablet Commonly known as:  CLARITIN Take 10 mg by mouth daily.   Melatonin 5 MG Tabs Take 1 tablet by mouth at bedtime.     METOPROLOL TARTRATE PO Take 25 mg by mouth. TAKE 1/2 TABLET (12.5MG ) BY MOUTH TWICE A DAY FOR TACHYCARDIA   NORCO 5-325 MG tablet Generic drug:  HYDROcodone-acetaminophen Take 1 tablet by mouth. GIVE HALF TABLET BY MOUTH TWICE DAILY FOR ARTHRITIS PAIN   ranitidine 75 MG tablet Commonly known as:  ZANTAC Take 75 mg by mouth at bedtime.   Vitamin D3 1000 units Caps Take 1,000 Units by mouth daily.       No orders of the defined types were placed in this encounter.   Immunization History  Administered Date(s) Administered  . Influenza Split 12/01/2010, 03/24/2012  . Pneumococcal Conjugate-13 04/24/2014    Social History   Tobacco Use  . Smoking status: Never Smoker  . Smokeless tobacco: Never Used  Substance Use Topics  . Alcohol use: No    Review of Systems  DATA OBTAINED: from patient, nurse GENERAL:  no fevers, fatigue, appetite changes SKIN: No itching, rash HEENT: No complaint RESPIRATORY: No cough, wheezing, SOB CARDIAC: No chest pain, palpitations, lower extremity edema  GI: No abdominal pain, No N/V/D or constipation, No heartburn or reflux  GU: No dysuria, frequency or urgency, or incontinence  MUSCULOSKELETAL: No unrelieved bone/joint pain NEUROLOGIC: No headache, dizziness  PSYCHIATRIC: No overt anxiety or sadness  Vitals:   10/15/17 1303  BP: (!) 92/54  Pulse: (!) 58  Resp: 16  Temp: (!) 97.4 F (36.3 C)   Body mass index is 21.24 kg/m. Physical Exam  GENERAL APPEARANCE: Alert, conversant, No acute distress  SKIN: No diaphoresis rash HEENT: Unremarkable RESPIRATORY: Breathing is even, unlabored. Lung sounds are clear   CARDIOVASCULAR: Heart RRR no murmurs, rubs or gallops. No peripheral edema  GASTROINTESTINAL: Abdomen is soft, non-tender, not distended w/ normal bowel sounds.  GENITOURINARY: Bladder non tender, not distended  MUSCULOSKELETAL: No abnormal joints or musculature NEUROLOGIC: Cranial nerves 2-12 grossly intact me: Right  hemiplegia PSYCHIATRIC: Mood and affect appropriate to situation, no behavioral issues  Patient Active Problem List   Diagnosis Date Noted  . Prediabetes 09/19/2017  . GERD (gastroesophageal reflux disease) 08/03/2017  . Vitamin D deficiency 08/03/2017  . Insomnia 08/03/2017  . Allergic rhinitis 08/03/2017  . Cerebral infarction (HCC)   . Stroke (HCC) 05/07/2017  . RUQ abdominal pain 09/11/2016  . Postural dizziness with presyncope 09/11/2016  . RUQ pain 09/11/2016  . Stroke (cerebrum) (HCC) 04/30/2016  . Hyperkalemia 04/30/2016  . Acute encephalopathy 04/30/2016  . Chronic diastolic CHF (congestive heart failure) (HCC) 04/30/2016  . Multiple duodenal ulcers   . RLQ abdominal pain   . Gallstones   . Elevated liver enzymes   . Abnormal finding on GI tract imaging   . Pancreas cyst   . Choledocholithiasis 01/30/2016  . Acute kidney injury (HCC) 01/30/2016  . Non-intractable vomiting  with nausea   . Bleeding 11/23/2015  . Chronic kidney disease 05/29/2012  . Skin ulcer, stage 2 (HCC) 05/29/2012  . Internal hemorrhoid, bleeding 05/29/2012  . Severe protein-calorie malnutrition (HCC) 03/24/2012  . Hyponatremia 03/23/2012  . Hypokalemia 03/23/2012  . Urinary tract infection 03/23/2012  . Physical deconditioning 03/23/2012  . CKD (chronic kidney disease) stage 3, GFR 30-59 ml/min (HCC) 12/01/2010  . Encephalopathy 12/01/2010  . CVA (cerebral vascular accident) (HCC) 11/28/2010  . ASTHMA 08/25/2008  . COLONIC POLYPS, ADENOMATOUS, HX OF 08/25/2008  . HLD (hyperlipidemia) 08/17/2008  . Microcytic hypochromic anemia 08/17/2008  . Hypertension 08/17/2008  . History of CVA (cerebrovascular accident) 08/17/2008  . DIVERTICULITIS, COLON 08/17/2008  . HYPERGLYCEMIA 08/17/2008    CMP     Component Value Date/Time   NA 142 08/07/2017   K 4.5 08/07/2017   CL 107 05/08/2017 0645   CO2 23 05/08/2017 0645   GLUCOSE 74 05/08/2017 0645   BUN 30 (A) 08/07/2017   CREATININE 1.4 (A)  08/07/2017   CREATININE 1.58 (H) 05/08/2017 0645   CALCIUM 8.8 (L) 05/08/2017 0645   PROT 6.6 05/06/2017 1730   ALBUMIN 3.3 (L) 05/06/2017 1730   AST 21 08/07/2017   ALT 23 08/07/2017   ALKPHOS 67 08/07/2017   BILITOT 0.5 05/06/2017 1730   GFRNONAA 30 (L) 05/08/2017 0645   GFRAA 35 (L) 05/08/2017 0645   Recent Labs    03/15/17 1655 05/06/17 1701 05/06/17 1730 05/08/17 0645 08/07/17  NA 138 137 136 138 142  K 4.1 4.9 4.8 4.1 4.5  CL 105 105 105 107  --   CO2 24  --  23 23  --   GLUCOSE 178* 180* 179* 74  --   BUN 33* 25* 24* 21* 30*  CREATININE 1.61* 1.70* 1.80* 1.58* 1.4*  CALCIUM 8.9  --  9.1 8.8*  --   MG 2.3  --   --   --   --    Recent Labs    03/15/17 1655 05/06/17 1730 08/07/17  AST 23 24 21   ALT 13* 14 23  ALKPHOS 69 50 67  BILITOT 0.4 0.5  --   PROT 6.5 6.6  --   ALBUMIN 3.5 3.3*  --    Recent Labs    03/15/17 1655 05/06/17 1701 05/06/17 1730 08/07/17  WBC 4.8  --  4.8 5.1  NEUTROABS 3.7  --  3.7  --   HGB 11.6* 12.2 11.4* 10.6*  HCT 36.4 36.0 36.1 32*  MCV 87.9  --  87.6  --   PLT 198  --  199 177   Recent Labs    05/07/17 0327 08/07/17  CHOL 226* 145  LDLCALC 145* 63  TRIG 56 76   No results found for: [facility] Lab Results  Component Value Date   TSH 1.79 08/07/2017   Lab Results  Component Value Date   HGBA1C 5.6 08/07/2017   Lab Results  Component Value Date   CHOL 145 08/07/2017   HDL 66 08/07/2017   LDLCALC 63 08/07/2017   TRIG 76 08/07/2017   CHOLHDL 3.2 05/07/2017    Significant Diagnostic Results in last 30 days:  No results found.  Assessment and Plan  GERD (gastroesophageal reflux disease) No complaints of problems; continue Zantac 75 mg nightly  Vitamin D deficiency Vitamin D level 42.85; continue replacement 1000 units daily  Insomnia No reported complaints; continue melatonin 5 mg p.o. nightly     [name]. [name], MD

## 2017-10-20 ENCOUNTER — Encounter: Payer: Self-pay | Admitting: Internal Medicine

## 2017-10-20 NOTE — Assessment & Plan Note (Signed)
Vitamin D level 42.85; continue replacement 1000 units daily

## 2017-10-20 NOTE — Assessment & Plan Note (Signed)
No reported complaints; continue melatonin 5 mg p.o. nightly

## 2017-10-20 NOTE — Assessment & Plan Note (Signed)
No complaints of problems; continue Zantac 75 mg nightly

## 2017-11-13 ENCOUNTER — Encounter: Payer: Self-pay | Admitting: Internal Medicine

## 2017-11-13 ENCOUNTER — Non-Acute Institutional Stay (SKILLED_NURSING_FACILITY): Payer: Medicare Other | Admitting: Internal Medicine

## 2017-11-13 DIAGNOSIS — J309 Allergic rhinitis, unspecified: Secondary | ICD-10-CM

## 2017-11-13 DIAGNOSIS — I63031 Cerebral infarction due to thrombosis of right carotid artery: Secondary | ICD-10-CM

## 2017-11-13 DIAGNOSIS — I10 Essential (primary) hypertension: Secondary | ICD-10-CM | POA: Diagnosis not present

## 2017-11-13 NOTE — Progress Notes (Signed)
[facility] Room Number: 305D Place of Service:  SNF (31)  Margit Hanks, MD  Patient Care Team: Margit Hanks, MD as PCP - General (Internal Medicine)  Extended Emergency Contact Information Primary Emergency Contact: Cudworth,[name] Address: [address]          [place], [place] [address] [name] Phone: [phone] Relation: Daughter Secondary Emergency Contact: [name] [place] Home Phone: [phone] Relation: Grandson    Allergies: Aspirin and Aspirin  Chief Complaint  Patient presents with  . Medical Management of Chronic Issues    Routine Visit    HPI: Patient is 82 y.o. female who is being seen for routine issues of allergic rhinitis, history of CVA, and hypertension.  Past Medical History:  Diagnosis Date  . Arthritis    hands  . Asthma   . Chronic diastolic CHF (congestive heart failure) (HCC)   . Chronic kidney disease (CKD), stage III (moderate) (HCC)   . Dementia (HCC)   . Depression   . Diabetes mellitus without complication (HCC)   . Diverticulosis   . Hx of adenomatous colonic polyps   . Hyperlipidemia   . Hypertension   . Hypochromic anemia   . Internal hemorrhoids   . Stroke [facility]) May 2009   multiple, last one Nov 2012, 2 strokes with right sided deficits and slurred speech  . Stroke [facility])     Past Surgical History:  Procedure Laterality Date  . ABDOMINAL HYSTERECTOMY    . ABDOMINAL SURGERY    . BOWEL RESECTION    . CHOLECYSTECTOMY N/A 09/15/2016   Procedure: LAPAROSCOPIC CHOLECYSTECTOMY WITH INTRAOPERATIVE CHOLANGIOGRAM;  Surgeon: Ovidio Kin, MD;  Location: [facility] ORS;  Service: General;  Laterality: N/A;  . COLECTOMY    . ESOPHAGOGASTRODUODENOSCOPY N/A 02/01/2016   Procedure: ESOPHAGOGASTRODUODENOSCOPY (EGD);  Surgeon: Beverley Fiedler, MD;  Location: [facility] ENDOSCOPY;  Service: Endoscopy;  Laterality: N/A;  . FLEXIBLE  SIGMOIDOSCOPY N/A 05/28/2012   Procedure: FLEXIBLE SIGMOIDOSCOPY;  Surgeon: Hart Carwin, MD;  Location: [facility] ENDOSCOPY;  Service: Endoscopy;  Laterality: N/A;  . OTHER SURGICAL HISTORY     2 ear surgeries  . PERIPHERAL VASCULAR CATHETERIZATION N/A 07/21/2014   Procedure: Abdominal Aortogram w/Lower Extremity;  Surgeon: Nada Libman, MD;  [facility]: [facility] CV LAB;  Service: Cardiovascular;  Laterality: N/A;  . PERIPHERAL VASCULAR CATHETERIZATION N/A 11/23/2015   Procedure: Abdominal Aortogram w/Lower Extremity;  Surgeon: Nada Libman, MD;  Location: [facility]IVE CV LAB;  Service: Cardiovascular;  Laterality: N/A;  . PERIPHERAL VASCULAR CATHETERIZATION  11/23/2015   Procedure: Peripheral Vascular Intervention;  Surgeon: Nada Libman, MD;  [facility]: [facility] CV LAB;  Service: Cardiovascular;;  Failed PVI of AT and Peroneal on left side  . SUBTOTAL COLECTOMY  2010   for stricturing from bouts of diverticulitis along with hx recurrent diverticular bleeds.  the surgery was a total abdominal colec  . TONSILLECTOMY    . VASCULAR SURGERY      Allergies as of 11/13/2017      Reactions   Aspirin Nausea Only   Aspirin Nausea And Vomiting   Hurts stomach      Medication List        Accurate as of 11/13/17 11:59 PM. Always use your most recent med list.          acetaminophen 325 MG tablet Commonly known as:  TYLENOL Take 650 mg by mouth every 6 (six) hours as needed for moderate pain.   atorvastatin 80 MG tablet Commonly known as:  LIPITOR Take 1 tablet (80 mg total) by mouth daily at 6 PM.   busPIRone 5 MG tablet Commonly known as:  BUSPAR Take 5 mg by mouth 2 (two) times daily.   clopidogrel 75 MG tablet Commonly known as:  PLAVIX Take 75 mg by mouth daily.   GLUCERNA Liqd Take 237 mLs by mouth 3 (three) times daily between meals.   loratadine 10 MG tablet Commonly known as:  CLARITIN Take 10 mg by mouth daily.   Melatonin 5 MG Tabs Take 1 tablet by mouth at  bedtime.   METOPROLOL TARTRATE PO Take 25 mg by mouth. TAKE 1/2 TABLET (12.5MG ) BY MOUTH TWICE A DAY FOR TACHYCARDIA   NORCO 5-325 MG tablet Generic drug:  HYDROcodone-acetaminophen Take 1 tablet by mouth. GIVE HALF TABLET BY MOUTH TWICE DAILY FOR ARTHRITIS PAIN   ranitidine 75 MG tablet Commonly known as:  ZANTAC Take 75 mg by mouth at bedtime.   Vitamin D3 1000 units Caps Take 1,000 Units by mouth daily.       No orders of the defined types were placed in this encounter.   Immunization History  Administered Date(s) Administered  . Influenza Split 12/01/2010, 03/24/2012  . Pneumococcal Conjugate-13 04/24/2014    Social History   Tobacco Use  . Smoking status: Never Smoker  . Smokeless tobacco: Never Used  Substance Use Topics  . Alcohol use: No    Review of Systems  DATA OBTAINED: from patient-limited; nursing-no acute concerns GENERAL:  no fevers, fatigue, appetite changes SKIN: No itching, rash HEENT: No complaint RESPIRATORY: No cough, wheezing, SOB CARDIAC: No chest pain, palpitations, lower extremity edema  GI: No abdominal pain, No N/V/D or constipation, No heartburn or reflux  GU: No dysuria, frequency or urgency, or incontinence  MUSCULOSKELETAL: No unrelieved bone/joint pain NEUROLOGIC: No headache, dizziness  PSYCHIATRIC: No overt anxiety or sadness  Vitals:   11/13/17 1406  BP: 113/60  Pulse: 61  Resp: 18  Temp: (!) 97.2 F (36.2 C)   Body mass index is 21.05 kg/m. Physical Exam  GENERAL APPEARANCE: Alert, conversant, No acute distress  SKIN: No diaphoresis rash HEENT: Unremarkable RESPIRATORY: Breathing is even, unlabored. Lung sounds are clear   CARDIOVASCULAR: Heart RRR no murmurs, rubs or gallops.  Trace peripheral edema  GASTROINTESTINAL: Abdomen is soft, non-tender, not distended w/ normal bowel sounds.  GENITOURINARY: Bladder non tender, not distended  MUSCULOSKELETAL: No abnormal joints or musculature NEUROLOGIC: Cranial  nerves 2-12 grossly intact. Moves all extremities PSYCHIATRIC: Mood and affect appropriate to situation with dementia, no behavioral issues  Patient Active Problem List   Diagnosis Date Noted  . Prediabetes 09/19/2017  . GERD (gastroesophageal reflux disease) 08/03/2017  . Vitamin D deficiency 08/03/2017  . Insomnia 08/03/2017  . Allergic rhinitis 08/03/2017  . Cerebral infarction (HCC)   . Stroke (HCC) 05/07/2017  . RUQ abdominal pain 09/11/2016  . Postural dizziness with presyncope 09/11/2016  . RUQ pain 09/11/2016  . Stroke (cerebrum) (HCC) 04/30/2016  . Hyperkalemia 04/30/2016  . Acute encephalopathy 04/30/2016  . Chronic diastolic CHF (congestive heart failure) (HCC) 04/30/2016  . Multiple duodenal ulcers   . RLQ abdominal pain   . Gallstones   . Elevated liver enzymes   . Abnormal finding on GI tract imaging   . Pancreas cyst   . Choledocholithiasis 01/30/2016  . Acute kidney injury (HCC) 01/30/2016  .  Non-intractable vomiting with nausea   . Bleeding 11/23/2015  . Chronic kidney disease 05/29/2012  . Skin ulcer, stage 2 (HCC) 05/29/2012  . Internal hemorrhoid, bleeding 05/29/2012  . Severe protein-calorie malnutrition (HCC) 03/24/2012  . Hyponatremia 03/23/2012  . Hypokalemia 03/23/2012  . Urinary tract infection 03/23/2012  . Physical deconditioning 03/23/2012  . CKD (chronic kidney disease) stage 3, GFR 30-59 ml/min (HCC) 12/01/2010  . Encephalopathy 12/01/2010  . CVA (cerebral vascular accident) (HCC) 11/28/2010  . ASTHMA 08/25/2008  . COLONIC POLYPS, ADENOMATOUS, HX OF 08/25/2008  . HLD (hyperlipidemia) 08/17/2008  . Microcytic hypochromic anemia 08/17/2008  . Hypertension 08/17/2008  . History of CVA (cerebrovascular accident) 08/17/2008  . DIVERTICULITIS, COLON 08/17/2008  . HYPERGLYCEMIA 08/17/2008    CMP     Component Value Date/Time   NA 142 08/07/2017   K 4.5 08/07/2017   CL 107 05/08/2017 0645   CO2 23 05/08/2017 0645   GLUCOSE 74 05/08/2017  0645   BUN 30 (A) 08/07/2017   CREATININE 1.4 (A) 08/07/2017   CREATININE 1.58 (H) 05/08/2017 0645   CALCIUM 8.8 (L) 05/08/2017 0645   PROT 6.6 05/06/2017 1730   ALBUMIN 3.3 (L) 05/06/2017 1730   AST 21 08/07/2017   ALT 23 08/07/2017   ALKPHOS 67 08/07/2017   BILITOT 0.5 05/06/2017 1730   GFRNONAA 30 (L) 05/08/2017 0645   GFRAA 35 (L) 05/08/2017 0645   Recent Labs    03/15/17 1655 05/06/17 1701 05/06/17 1730 05/08/17 0645 08/07/17  NA 138 137 136 138 142  K 4.1 4.9 4.8 4.1 4.5  CL 105 105 105 107  --   CO2 24  --  23 23  --   GLUCOSE 178* 180* 179* 74  --   BUN 33* 25* 24* 21* 30*  CREATININE 1.61* 1.70* 1.80* 1.58* 1.4*  CALCIUM 8.9  --  9.1 8.8*  --   MG 2.3  --   --   --   --    Recent Labs    03/15/17 1655 05/06/17 1730 08/07/17  AST 23 24 21   ALT 13* 14 23  ALKPHOS 69 50 67  BILITOT 0.4 0.5  --   PROT 6.5 6.6  --   ALBUMIN 3.5 3.3*  --    Recent Labs    03/15/17 1655 05/06/17 1701 05/06/17 1730 08/07/17  WBC 4.8  --  4.8 5.1  NEUTROABS 3.7  --  3.7  --   HGB 11.6* 12.2 11.4* 10.6*  HCT 36.4 36.0 36.1 32*  MCV 87.9  --  87.6  --   PLT 198  --  199 177   Recent Labs    05/07/17 0327 08/07/17  CHOL 226* 145  LDLCALC 145* 63  TRIG 56 76   No results found for: [facility] Lab Results  Component Value Date   TSH 1.79 08/07/2017   Lab Results  Component Value Date   HGBA1C 5.6 08/07/2017   Lab Results  Component Value Date   CHOL 145 08/07/2017   HDL 66 08/07/2017   LDLCALC 63 08/07/2017   TRIG 76 08/07/2017   CHOLHDL 3.2 05/07/2017    Significant Diagnostic Results in last 30 days:  No results found.  Assessment and Plan  Allergic rhinitis Chronic and stable; continue Claritin 10 mg p.o. daily  CVA (cerebral vascular accident) (HCC) Chronic and stable; no recurrence; patient is on Plavix 75 mg daily, has allergy to aspirin; patient is on statin  Hypertension Controlled; continue metoprolol 12.5 mg twice daily  Randon Goldsmith.  [name], MD

## 2017-11-25 ENCOUNTER — Encounter: Payer: Self-pay | Admitting: Internal Medicine

## 2017-11-25 NOTE — Assessment & Plan Note (Signed)
Controlled; continue metoprolol 12.5 mg twice daily 

## 2017-11-25 NOTE — Assessment & Plan Note (Signed)
Chronic and stable; no recurrence; patient is on Plavix 75 mg daily, has allergy to aspirin; patient is on statin

## 2017-11-25 NOTE — Assessment & Plan Note (Signed)
Chronic and stable; continue Claritin 10 mg p.o. daily

## 2017-12-13 ENCOUNTER — Encounter: Payer: Self-pay | Admitting: Internal Medicine

## 2017-12-13 ENCOUNTER — Non-Acute Institutional Stay (SKILLED_NURSING_FACILITY): Payer: Medicare Other | Admitting: Internal Medicine

## 2017-12-13 DIAGNOSIS — Z8673 Personal history of transient ischemic attack (TIA), and cerebral infarction without residual deficits: Secondary | ICD-10-CM

## 2017-12-13 DIAGNOSIS — E785 Hyperlipidemia, unspecified: Secondary | ICD-10-CM | POA: Diagnosis not present

## 2017-12-13 DIAGNOSIS — N183 Chronic kidney disease, stage 3 unspecified: Secondary | ICD-10-CM

## 2017-12-13 NOTE — Progress Notes (Signed)
Location:  [facility] Room Number: 305D Place of Service:  SNF (31)  [name]. [name], MD  Patient Care Team: Margit Hanks, MD as PCP - General (Internal Medicine)  Extended Emergency Contact Information Primary Emergency Contact: Kruckenberg,[name] Address: [address]          [place], [place] [address] [name] of [place] Phone: [phone] Relation: Daughter Secondary Emergency Contact: [name] [place] of [place] Home Phone: [phone] Relation: Grandson    Allergies: Aspirin and Aspirin  Chief Complaint  Patient presents with  . Medical Management of Chronic Issues    Routine Visit    HPI: Patient is 82 y.o. female who is being seen for routine issues of chronic kidney disease stage III, hyperlipidemia, and history of CVA.  Past Medical History:  Diagnosis Date  . Arthritis    hands  . Asthma   . Chronic diastolic CHF (congestive heart failure) (HCC)   . Chronic kidney disease (CKD), stage III (moderate) (HCC)   . Dementia (HCC)   . Depression   . Diabetes mellitus without complication (HCC)   . Diverticulosis   . Hx of adenomatous colonic polyps   . Hyperlipidemia   . Hypertension   . Hypochromic anemia   . Internal hemorrhoids   . Stroke [facility]) May 2009   multiple, last one Nov 2012, 2 strokes with right sided deficits and slurred speech  . Stroke [facility])     Past Surgical History:  Procedure Laterality Date  . ABDOMINAL HYSTERECTOMY    . ABDOMINAL SURGERY    . BOWEL RESECTION    . CHOLECYSTECTOMY N/A 09/15/2016   Procedure: LAPAROSCOPIC CHOLECYSTECTOMY WITH INTRAOPERATIVE CHOLANGIOGRAM;  Surgeon: Ovidio Kin, MD;  Location: [facility] ORS;  Service: General;  Laterality: N/A;  . COLECTOMY    . ESOPHAGOGASTRODUODENOSCOPY N/A 02/01/2016   Procedure: ESOPHAGOGASTRODUODENOSCOPY (EGD);  Surgeon: Beverley Fiedler, MD;  Location: [facility] ENDOSCOPY;  Service: Endoscopy;  Laterality: N/A;   . FLEXIBLE SIGMOIDOSCOPY N/A 05/28/2012   Procedure: FLEXIBLE SIGMOIDOSCOPY;  Surgeon: Hart Carwin, MD;  Location: [facility] ENDOSCOPY;  Service: Endoscopy;  Laterality: N/A;  . OTHER SURGICAL HISTORY     2 ear surgeries  . PERIPHERAL VASCULAR CATHETERIZATION N/A 07/21/2014   Procedure: Abdominal Aortogram w/Lower Extremity;  Surgeon: Nada Libman, MD;  [facility]: [facility] INVASIVE CV LAB;  Service: Cardiovascular;  Laterality: N/A;  . PERIPHERAL VASCULAR CATHETERIZATION N/A 11/23/2015   Procedure: Abdominal Aortogram w/Lower Extremity;  Surgeon: Nada Libman, MD;  Location: [facility] INVASIVE CV LAB;  Service: Cardiovascular;  Laterality: N/A;  . PERIPHERAL VASCULAR CATHETERIZATION  11/23/2015   Procedure: Peripheral Vascular Intervention;  Surgeon: Nada Libman, MD;  Location: [facility] INVASIVE CV LAB;  Service: Cardiovascular;;  Failed PVI of AT and Peroneal on left side  . SUBTOTAL COLECTOMY  2010   for stricturing from bouts of diverticulitis along with hx recurrent diverticular bleeds.  the surgery was a total abdominal colec  . TONSILLECTOMY    . VASCULAR SURGERY      Allergies as of 12/13/2017      Reactions   Aspirin Nausea Only   Aspirin Nausea And Vomiting   Hurts stomach      Medication List        Accurate as of 12/13/17 11:59 PM. Always use your most recent med list.          acetaminophen 325 MG tablet  Commonly known as:  TYLENOL Take 650 mg by mouth every 6 (six) hours as needed for moderate pain.   atorvastatin 80 MG tablet Commonly known as:  LIPITOR Take 1 tablet (80 mg total) by mouth daily at 6 PM.   busPIRone 5 MG tablet Commonly known as:  BUSPAR Take 5 mg by mouth 2 (two) times daily.   clopidogrel 75 MG tablet Commonly known as:  PLAVIX Take 75 mg by mouth daily.   GLUCERNA Liqd Take 237 mLs by mouth 3 (three) times daily between meals.   loratadine 10 MG tablet Commonly known as:  CLARITIN Take 10 mg by mouth daily.   Melatonin 5 MG Tabs Take 1 tablet  by mouth at bedtime.   METOPROLOL TARTRATE PO Take 25 mg by mouth. TAKE 1/2 TABLET (12.5MG ) BY MOUTH TWICE A DAY FOR TACHYCARDIA   NORCO 5-325 MG tablet Generic drug:  HYDROcodone-acetaminophen Take 1 tablet by mouth. GIVE HALF TABLET BY MOUTH TWICE DAILY FOR ARTHRITIS PAIN   ranitidine 75 MG tablet Commonly known as:  ZANTAC Take 75 mg by mouth at bedtime.   Vitamin D3 25 MCG (1000 UT) Caps Take 1,000 Units by mouth daily.       No orders of the defined types were placed in this encounter.   Immunization History  Administered Date(s) Administered  . Influenza Split 12/01/2010, 03/24/2012  . Pneumococcal Conjugate-13 04/24/2014    Social History   Tobacco Use  . Smoking status: Never Smoker  . Smokeless tobacco: Never Used  Substance Use Topics  . Alcohol use: No    Review of Systems  DATA OBTAINED: from patient-limited; nursing-no acute concerns GENERAL:  no fevers, fatigue, appetite changes SKIN: No itching, rash HEENT: No complaint RESPIRATORY: No cough, wheezing, SOB CARDIAC: No chest pain, palpitations, lower extremity edema  GI: No abdominal pain, No N/V/D or constipation, No heartburn or reflux  GU: No dysuria, frequency or urgency, or incontinence  MUSCULOSKELETAL: No unrelieved bone/joint pain NEUROLOGIC: No headache, dizziness  PSYCHIATRIC: No overt anxiety or sadness  Vitals:   12/13/17 1245  BP: 126/67  Pulse: 74  Resp: 18  Temp: (!) 97 F (36.1 C)   Body mass index is 19.64 kg/m. Physical Exam  GENERAL APPEARANCE: Alert, conversant, No acute distress  SKIN: No diaphoresis rash HEENT: Unremarkable RESPIRATORY: Breathing is even, unlabored   CARDIOVASCULAR:  No peripheral edema  GASTROINTESTINAL: Abdomen is not distended   GENITOURINARY: Bladder not distended  MUSCULOSKELETAL: No abnormal joints or musculature NEUROLOGIC: Cranial nerves 2-12 grossly intact. Moves all extremities PSYCHIATRIC: Mood and affect appropriate to situation  with dementia, no behavioral issues  Patient Active Problem List   Diagnosis Date Noted  . Prediabetes 09/19/2017  . GERD (gastroesophageal reflux disease) 08/03/2017  . Vitamin D deficiency 08/03/2017  . Insomnia 08/03/2017  . Allergic rhinitis 08/03/2017  . Cerebral infarction (HCC)   . Stroke (HCC) 05/07/2017  . RUQ abdominal pain 09/11/2016  . Postural dizziness with presyncope 09/11/2016  . RUQ pain 09/11/2016  . Stroke (cerebrum) (HCC) 04/30/2016  . Hyperkalemia 04/30/2016  . Acute encephalopathy 04/30/2016  . Chronic diastolic CHF (congestive heart failure) (HCC) 04/30/2016  . Multiple duodenal ulcers   . RLQ abdominal pain   . Gallstones   . Elevated liver enzymes   . Abnormal finding on GI tract imaging   . Pancreas cyst   . Choledocholithiasis 01/30/2016  . Acute kidney injury (HCC) 01/30/2016  . Non-intractable vomiting with nausea   . Bleeding 11/23/2015  .  Chronic kidney disease 05/29/2012  . Skin ulcer, stage 2 (HCC) 05/29/2012  . Internal hemorrhoid, bleeding 05/29/2012  . Severe protein-calorie malnutrition (HCC) 03/24/2012  . Hyponatremia 03/23/2012  . Hypokalemia 03/23/2012  . Urinary tract infection 03/23/2012  . Physical deconditioning 03/23/2012  . CKD (chronic kidney disease) stage 3, GFR 30-59 ml/min (HCC) 12/01/2010  . Encephalopathy 12/01/2010  . CVA (cerebral vascular accident) (HCC) 11/28/2010  . ASTHMA 08/25/2008  . COLONIC POLYPS, ADENOMATOUS, HX OF 08/25/2008  . HLD (hyperlipidemia) 08/17/2008  . Microcytic hypochromic anemia 08/17/2008  . Hypertension 08/17/2008  . History of CVA (cerebrovascular accident) 08/17/2008  . DIVERTICULITIS, COLON 08/17/2008  . HYPERGLYCEMIA 08/17/2008    CMP     Component Value Date/Time   NA 142 08/07/2017   K 4.5 08/07/2017   CL 107 05/08/2017 0645   CO2 23 05/08/2017 0645   GLUCOSE 74 05/08/2017 0645   BUN 30 (A) 08/07/2017   CREATININE 1.4 (A) 08/07/2017   CREATININE 1.58 (H) 05/08/2017 0645    CALCIUM 8.8 (L) 05/08/2017 0645   PROT 6.6 05/06/2017 1730   ALBUMIN 3.3 (L) 05/06/2017 1730   AST 21 08/07/2017   ALT 23 08/07/2017   ALKPHOS 67 08/07/2017   BILITOT 0.5 05/06/2017 1730   GFRNONAA 30 (L) 05/08/2017 0645   GFRAA 35 (L) 05/08/2017 0645   Recent Labs    03/15/17 1655 05/06/17 1701 05/06/17 1730 05/08/17 0645 08/07/17  NA 138 137 136 138 142  K 4.1 4.9 4.8 4.1 4.5  CL 105 105 105 107  --   CO2 24  --  23 23  --   GLUCOSE 178* 180* 179* 74  --   BUN 33* 25* 24* 21* 30*  CREATININE 1.61* 1.70* 1.80* 1.58* 1.4*  CALCIUM 8.9  --  9.1 8.8*  --   MG 2.3  --   --   --   --    Recent Labs    03/15/17 1655 05/06/17 1730 08/07/17  AST 23 24 21   ALT 13* 14 23  ALKPHOS 69 50 67  BILITOT 0.4 0.5  --   PROT 6.5 6.6  --   ALBUMIN 3.5 3.3*  --    Recent Labs    03/15/17 1655 05/06/17 1701 05/06/17 1730 08/07/17  WBC 4.8  --  4.8 5.1  NEUTROABS 3.7  --  3.7  --   HGB 11.6* 12.2 11.4* 10.6*  HCT 36.4 36.0 36.1 32*  MCV 87.9  --  87.6  --   PLT 198  --  199 177   Recent Labs    05/07/17 0327 08/07/17  CHOL 226* 145  LDLCALC 145* 63  TRIG 56 76   No results found for: [facility] Lab Results  Component Value Date   TSH 1.79 08/07/2017   Lab Results  Component Value Date   HGBA1C 5.6 08/07/2017   Lab Results  Component Value Date   CHOL 145 08/07/2017   HDL 66 08/07/2017   LDLCALC 63 08/07/2017   TRIG 76 08/07/2017   CHOLHDL 3.2 05/07/2017    Significant Diagnostic Results in last 30 days:  No results found.  Assessment and Plan  CKD (chronic kidney disease) stage 3, GFR 30-59 ml/min (HCC) GFR 30, and creatinine 1.4 both stable or improved; will monitor intervals  HLD (hyperlipidemia) LDL 63, HDL 66, very good control; continue Lipitor 80 mg daily  History of CVA (cerebrovascular accident) Stable; continue Plavix 75 mg daily.  Patient is on statin and lipids are well controlled  and patient is not diabetic    Thurston Hole D. [name],  MD

## 2017-12-16 ENCOUNTER — Encounter: Payer: Self-pay | Admitting: Internal Medicine

## 2017-12-16 NOTE — Assessment & Plan Note (Signed)
Stable; continue Plavix 75 mg daily.  Patient is on statin and lipids are well controlled and patient is not diabetic

## 2017-12-16 NOTE — Assessment & Plan Note (Signed)
LDL 63, HDL 66, very good control; continue Lipitor 80 mg daily

## 2017-12-16 NOTE — Assessment & Plan Note (Signed)
GFR 30, and creatinine 1.4 both stable or improved; will monitor intervals

## 2017-12-18 ENCOUNTER — Encounter: Payer: Self-pay | Admitting: Internal Medicine

## 2017-12-18 DIAGNOSIS — R627 Adult failure to thrive: Secondary | ICD-10-CM | POA: Insufficient documentation

## 2018-01-09 ENCOUNTER — Non-Acute Institutional Stay (SKILLED_NURSING_FACILITY): Payer: Medicare Other | Admitting: Internal Medicine

## 2018-01-09 ENCOUNTER — Encounter: Payer: Self-pay | Admitting: Internal Medicine

## 2018-01-09 DIAGNOSIS — I5032 Chronic diastolic (congestive) heart failure: Secondary | ICD-10-CM

## 2018-01-09 DIAGNOSIS — E559 Vitamin D deficiency, unspecified: Secondary | ICD-10-CM | POA: Diagnosis not present

## 2018-01-09 DIAGNOSIS — K219 Gastro-esophageal reflux disease without esophagitis: Secondary | ICD-10-CM

## 2018-01-09 NOTE — Progress Notes (Signed)
Location:  [facility] Room Number: 305D Place of Service:  SNF (31)  [name]. Emma Hollingshead, MD  Patient Care Team: Margit Hanks, MD as PCP - General (Internal Medicine)  Extended Emergency Contact Information Primary Emergency Contact: Michalik,[name] Address: [address]          [place], [place] [address] [name] of [place] Phone: [phone] Relation: Daughter Secondary Emergency Contact: [name] [place] of [place] Home Phone: [phone] Relation: Grandson    Allergies: Aspirin and Aspirin  Chief Complaint  Patient presents with  . Medical Management of Chronic Issues    Routine Visit    HPI: Patient is 82 y.o. female who is being seen for routine issues of chronic diastolic congestive heart failure, GERD, and vitamin D deficiency.  Past Medical History:  Diagnosis Date  . Arthritis    hands  . Asthma   . Chronic diastolic CHF (congestive heart failure) (HCC)   . Chronic kidney disease (CKD), stage III (moderate) (HCC)   . Dementia (HCC)   . Depression   . Diabetes mellitus without complication (HCC)   . Diverticulosis   . Hx of adenomatous colonic polyps   . Hyperlipidemia   . Hypertension   . Hypochromic anemia   . Internal hemorrhoids   . Stroke [facility]) May 2009   multiple, last one Nov 2012, 2 strokes with right sided deficits and slurred speech  . Stroke [facility])     Past Surgical History:  Procedure Laterality Date  . ABDOMINAL HYSTERECTOMY    . ABDOMINAL SURGERY    . BOWEL RESECTION    . CHOLECYSTECTOMY N/A 09/15/2016   Procedure: LAPAROSCOPIC CHOLECYSTECTOMY WITH INTRAOPERATIVE CHOLANGIOGRAM;  Surgeon: Ovidio Kin, MD;  Location: [facility] ORS;  Service: General;  Laterality: N/A;  . COLECTOMY    . ESOPHAGOGASTRODUODENOSCOPY N/A 02/01/2016   Procedure: ESOPHAGOGASTRODUODENOSCOPY (EGD);  Surgeon: Beverley Fiedler, MD;  Location: [facility] ENDOSCOPY;  Service: Endoscopy;   Laterality: N/A;  . FLEXIBLE SIGMOIDOSCOPY N/A 05/28/2012   Procedure: FLEXIBLE SIGMOIDOSCOPY;  Surgeon: Hart Carwin, MD;  Location: [facility] ENDOSCOPY;  Service: Endoscopy;  Laterality: N/A;  . OTHER SURGICAL HISTORY     2 ear surgeries  . PERIPHERAL VASCULAR CATHETERIZATION N/A 07/21/2014   Procedure: Abdominal Aortogram w/Lower Extremity;  Surgeon: Nada Libman, MD;  Location: [facility] INVASIVE CV LAB;  Service: Cardiovascular;  Laterality: N/A;  . PERIPHERAL VASCULAR CATHETERIZATION N/A 11/23/2015   Procedure: Abdominal Aortogram w/Lower Extremity;  Surgeon: Nada Libman, MD;  Location: [facility] INVASIVE CV LAB;  Service: Cardiovascular;  Laterality: N/A;  . PERIPHERAL VASCULAR CATHETERIZATION  11/23/2015   Procedure: Peripheral Vascular Intervention;  Surgeon: Nada Libman, MD;  Location: [facility] INVASIVE CV LAB;  Service: Cardiovascular;;  Failed PVI of AT and Peroneal on left side  . SUBTOTAL COLECTOMY  2010   for stricturing from bouts of diverticulitis along with hx recurrent diverticular bleeds.  the surgery was a total abdominal colec  . TONSILLECTOMY    . VASCULAR SURGERY      Allergies as of 01/09/2018      Reactions   Aspirin Nausea Only   Aspirin Nausea And Vomiting   Hurts stomach      Medication List       Accurate as of January 09, 2018 11:59 PM. Always use your most recent med list.        acetaminophen 325 MG tablet Commonly  known as:  TYLENOL Take 650 mg by mouth every 6 (six) hours as needed for moderate pain.   atorvastatin 80 MG tablet Commonly known as:  LIPITOR Take 1 tablet (80 mg total) by mouth daily at 6 PM.   busPIRone 5 MG tablet Commonly known as:  BUSPAR Take 5 mg by mouth 2 (two) times daily.   clopidogrel 75 MG tablet Commonly known as:  PLAVIX Take 75 mg by mouth daily.   GLUCERNA Liqd Take 237 mLs by mouth 3 (three) times daily between meals.   loratadine 10 MG tablet Commonly known as:  CLARITIN Take 10 mg by mouth daily.   Melatonin 5 MG  Tabs Take 1 tablet by mouth at bedtime.   METOPROLOL TARTRATE PO Take 25 mg by mouth. TAKE 1/2 TABLET (12.5MG ) BY MOUTH TWICE A DAY FOR TACHYCARDIA   NORCO 5-325 MG tablet Generic drug:  HYDROcodone-acetaminophen Take 1 tablet by mouth. GIVE HALF TABLET BY MOUTH TWICE DAILY FOR ARTHRITIS PAIN   ranitidine 75 MG tablet Commonly known as:  ZANTAC Take 75 mg by mouth at bedtime.   Vitamin D3 25 MCG (1000 UT) Caps Take 1,000 Units by mouth daily.       No orders of the defined types were placed in this encounter.   Immunization History  Administered Date(s) Administered  . Influenza Split 12/01/2010, 03/24/2012  . Pneumococcal Conjugate-13 04/24/2014    Social History   Tobacco Use  . Smoking status: Never Smoker  . Smokeless tobacco: Never Used  Substance Use Topics  . Alcohol use: No    Review of Systems  DATA OBTAINED: from patient, nurse GENERAL:  no fevers, fatigue, appetite changes SKIN: No itching, rash HEENT: No complaint RESPIRATORY: No cough, wheezing, SOB CARDIAC: No chest pain, palpitations, lower extremity edema  GI: No abdominal pain, No N/V/D or constipation, No heartburn or reflux  GU: No dysuria, frequency or urgency, or incontinence  MUSCULOSKELETAL: No unrelieved bone/joint pain NEUROLOGIC: No headache, dizziness  PSYCHIATRIC: No overt anxiety or sadness  Vitals:   01/09/18 1508  BP: (!) 156/90  Pulse: 79  Resp: 18  Temp: (!) 96.8 F (36 C)   Body mass index is 19.16 kg/m. Physical Exam  GENERAL APPEARANCE: Alert, conversant, No acute distress  SKIN: No diaphoresis rash HEENT: Unremarkable RESPIRATORY: Breathing is even, unlabored. Lung sounds are clear   CARDIOVASCULAR: Heart RRR no murmurs, rubs or gallops. No peripheral edema  GASTROINTESTINAL: Abdomen is soft, non-tender, not distended w/ normal bowel sounds.  GENITOURINARY: Bladder non tender, not distended  MUSCULOSKELETAL: No abnormal joints or musculature NEUROLOGIC:  Cranial nerves 2-12 grossly intact. Moves all extremities PSYCHIATRIC: Mood and affect appropriate to situation dementia, no behavioral issues  Patient Active Problem List   Diagnosis Date Noted  . Failure to thrive in adult 12/18/2017  . Prediabetes 09/19/2017  . GERD (gastroesophageal reflux disease) 08/03/2017  . Vitamin D deficiency 08/03/2017  . Insomnia 08/03/2017  . Allergic rhinitis 08/03/2017  . Cerebral infarction (HCC)   . Stroke (HCC) 05/07/2017  . RUQ abdominal pain 09/11/2016  . Postural dizziness with presyncope 09/11/2016  . RUQ pain 09/11/2016  . Stroke (cerebrum) (HCC) 04/30/2016  . Hyperkalemia 04/30/2016  . Acute encephalopathy 04/30/2016  . Chronic diastolic CHF (congestive heart failure) (HCC) 04/30/2016  . Multiple duodenal ulcers   . RLQ abdominal pain   . Gallstones   . Elevated liver enzymes   . Abnormal finding on GI tract imaging   . Pancreas cyst   .  Choledocholithiasis 01/30/2016  . Acute kidney injury (HCC) 01/30/2016  . Non-intractable vomiting with nausea   . Bleeding 11/23/2015  . Chronic kidney disease 05/29/2012  . Skin ulcer, stage 2 (HCC) 05/29/2012  . Internal hemorrhoid, bleeding 05/29/2012  . Severe protein-calorie malnutrition (HCC) 03/24/2012  . Hyponatremia 03/23/2012  . Hypokalemia 03/23/2012  . Urinary tract infection 03/23/2012  . Physical deconditioning 03/23/2012  . CKD (chronic kidney disease) stage 3, GFR 30-59 ml/min (HCC) 12/01/2010  . Encephalopathy 12/01/2010  . CVA (cerebral vascular accident) (HCC) 11/28/2010  . ASTHMA 08/25/2008  . COLONIC POLYPS, ADENOMATOUS, HX OF 08/25/2008  . HLD (hyperlipidemia) 08/17/2008  . Microcytic hypochromic anemia 08/17/2008  . Hypertension 08/17/2008  . History of CVA (cerebrovascular accident) 08/17/2008  . DIVERTICULITIS, COLON 08/17/2008  . HYPERGLYCEMIA 08/17/2008    CMP     Component Value Date/Time   NA 142 08/07/2017   K 4.5 08/07/2017   CL 107 05/08/2017 0645   CO2  23 05/08/2017 0645   GLUCOSE 74 05/08/2017 0645   BUN 30 (A) 08/07/2017   CREATININE 1.4 (A) 08/07/2017   CREATININE 1.58 (H) 05/08/2017 0645   CALCIUM 8.8 (L) 05/08/2017 0645   PROT 6.6 05/06/2017 1730   ALBUMIN 3.3 (L) 05/06/2017 1730   AST 21 08/07/2017   ALT 23 08/07/2017   ALKPHOS 67 08/07/2017   BILITOT 0.5 05/06/2017 1730   GFRNONAA 30 (L) 05/08/2017 0645   GFRAA 35 (L) 05/08/2017 0645   Recent Labs    03/15/17 1655 05/06/17 1701 05/06/17 1730 05/08/17 0645 08/07/17  NA 138 137 136 138 142  K 4.1 4.9 4.8 4.1 4.5  CL 105 105 105 107  --   CO2 24  --  23 23  --   GLUCOSE 178* 180* 179* 74  --   BUN 33* 25* 24* 21* 30*  CREATININE 1.61* 1.70* 1.80* 1.58* 1.4*  CALCIUM 8.9  --  9.1 8.8*  --   MG 2.3  --   --   --   --    Recent Labs    03/15/17 1655 05/06/17 1730 08/07/17  AST 23 24 21   ALT 13* 14 23  ALKPHOS 69 50 67  BILITOT 0.4 0.5  --   PROT 6.5 6.6  --   ALBUMIN 3.5 3.3*  --    Recent Labs    03/15/17 1655 05/06/17 1701 05/06/17 1730 08/07/17  WBC 4.8  --  4.8 5.1  NEUTROABS 3.7  --  3.7  --   HGB 11.6* 12.2 11.4* 10.6*  HCT 36.4 36.0 36.1 32*  MCV 87.9  --  87.6  --   PLT 198  --  199 177   Recent Labs    05/07/17 0327 08/07/17  CHOL 226* 145  LDLCALC 145* 63  TRIG 56 76   No results found for: [facility] Lab Results  Component Value Date   TSH 1.79 08/07/2017   Lab Results  Component Value Date   HGBA1C 5.6 08/07/2017   Lab Results  Component Value Date   CHOL 145 08/07/2017   HDL 66 08/07/2017   LDLCALC 63 08/07/2017   TRIG 76 08/07/2017   CHOLHDL 3.2 05/07/2017    Significant Diagnostic Results in last 30 days:  No results found.  Assessment and Plan  Chronic diastolic CHF (congestive heart failure) (HCC) Very stable; continue metoprolol 12.5 mg twice daily, patient is not currently requiring a diuretic  GERD (gastroesophageal reflux disease) No reports of problems; continue Zantac 75 mg nightly  Vitamin D  deficiency Level stable; continue replacement 1000 units daily    Zared Knoth D. Emma Hollingshead, MD

## 2018-01-10 IMAGING — CR DG FOOT COMPLETE 3+V*R*
3 series · 3 of 3 positions shown · non-contrast
Comparison: None.

CLINICAL DATA: Status post fall today with great toe pain

EXAM:
RIGHT FOOT COMPLETE - 3+ VIEW

[foot ap]
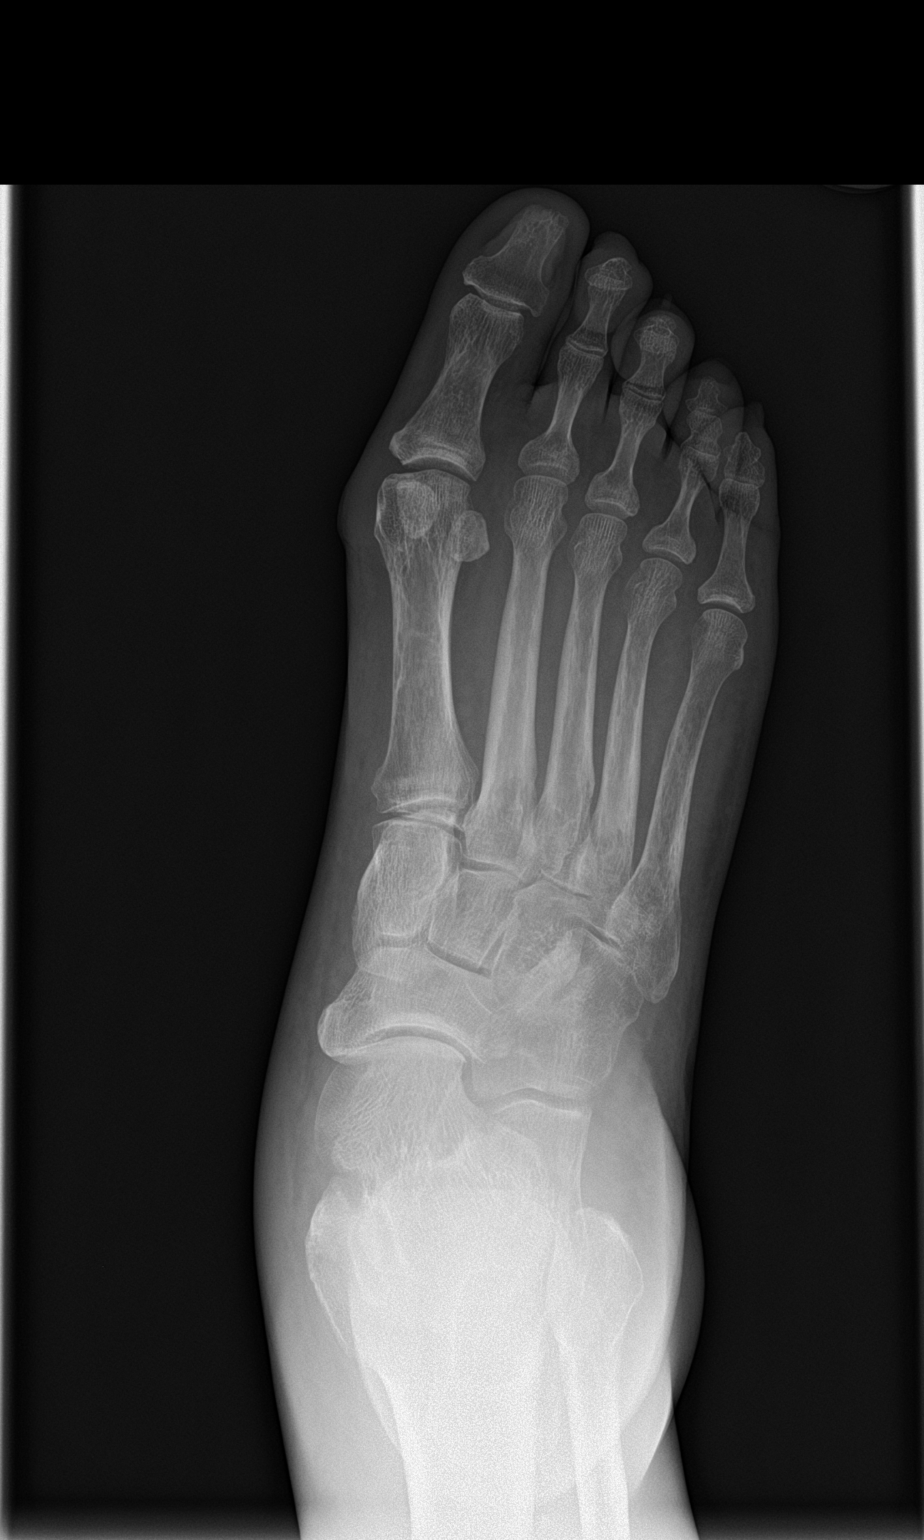

[foot obl]
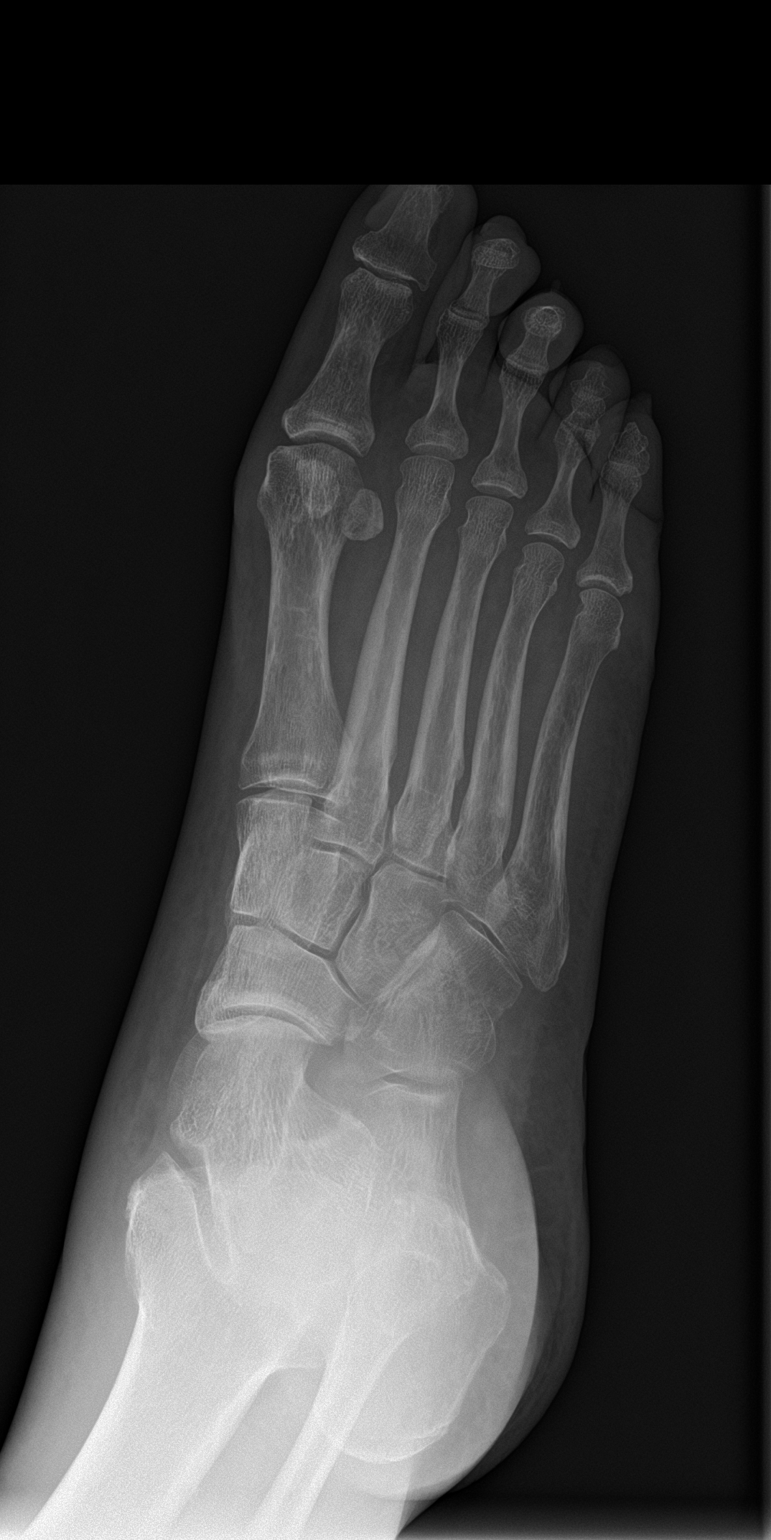

[foot lat]
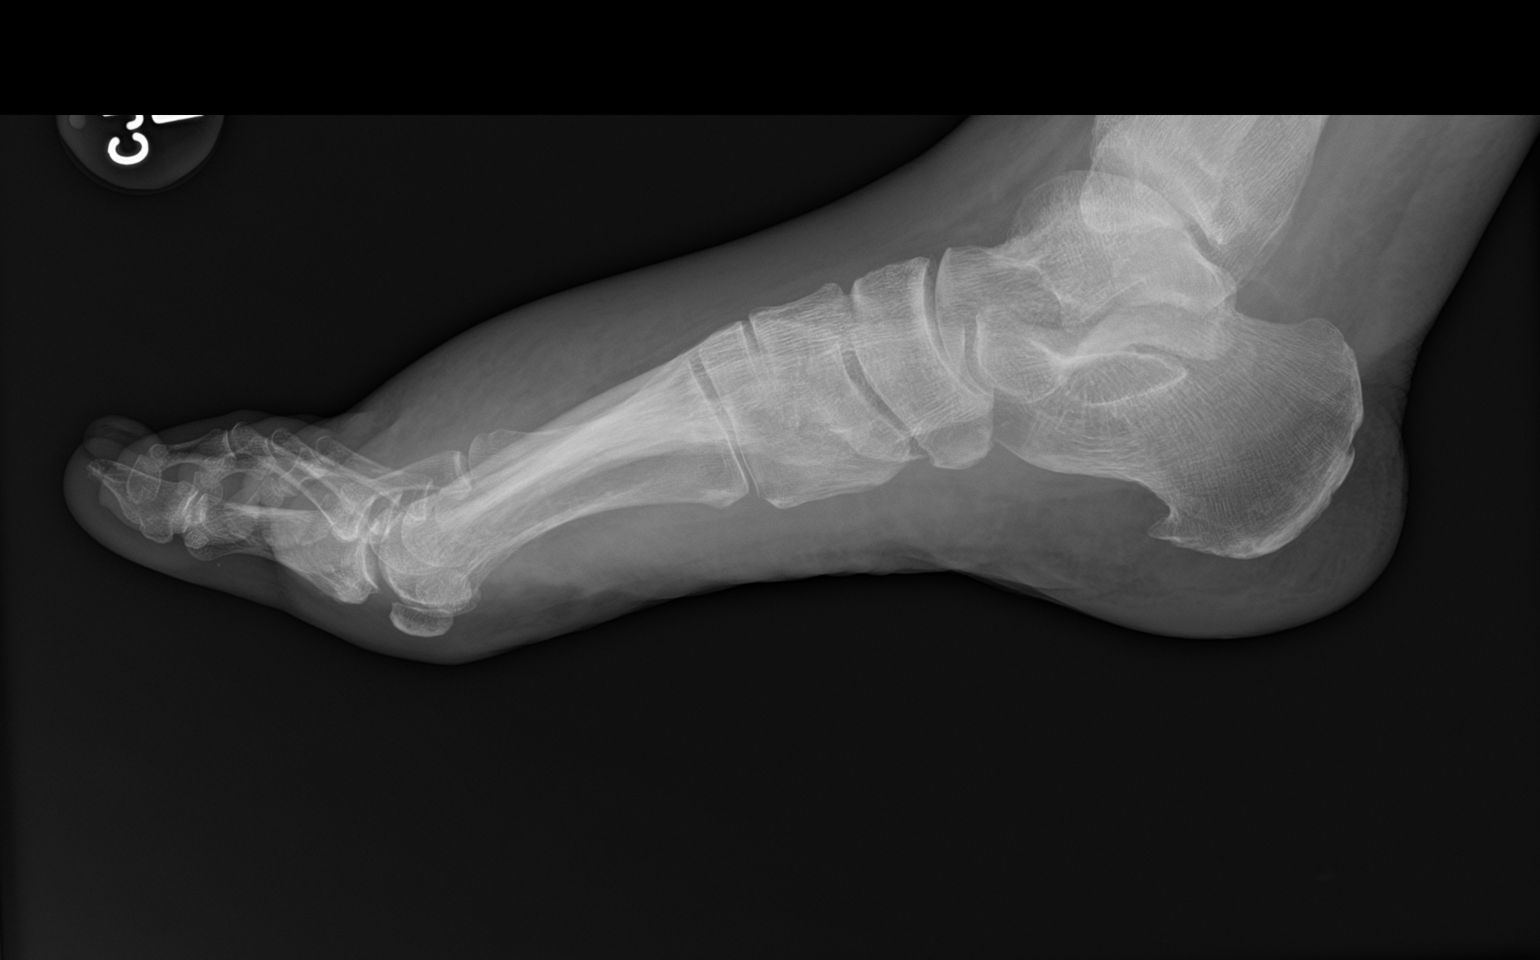

[3 of 3 positions shown; findings below may reference images not displayed]

FINDINGS: There is no evidence of fracture or dislocation. There is soft
tissue swelling of the forefoot. Plantar calcaneal spur is
identified.
IMPRESSION: No acute fracture or dislocation.

## 2018-01-10 IMAGING — CT CT HEAD W/O CM
2 series · 15 of 30 positions shown, 17 images · non-contrast
Comparison: 12/28/2014

CLINICAL DATA: Recent fall hitting occipital region with pain,
initial encounter

EXAM:
CT HEAD WITHOUT CONTRAST
TECHNIQUE: Contiguous axial images were obtained from the base of the skull
through the vertex without intravenous contrast.

[Series 2: head without · axial · non-contrast · 0.41mm/px · z∈[-99,+26]mm · 7 of 35 slices shown, 9 images]
[im 5/35  brain]
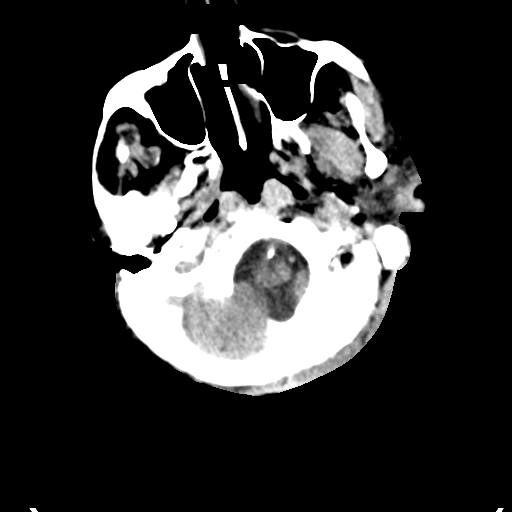
[im 5/35  bone]
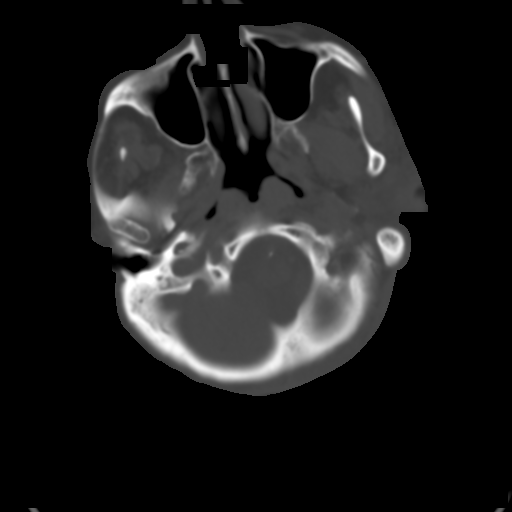
[im 9/35  brain]
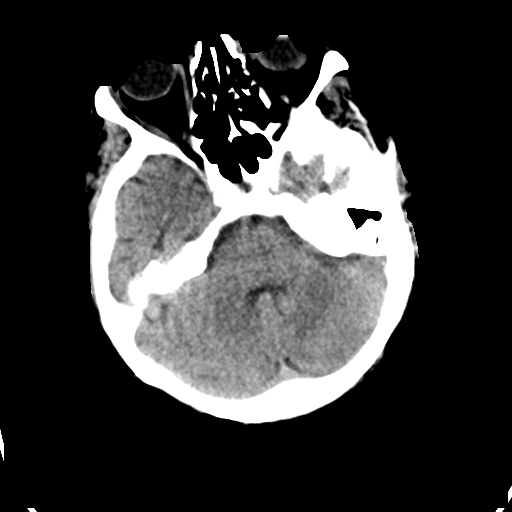
[im 13/35  brain]
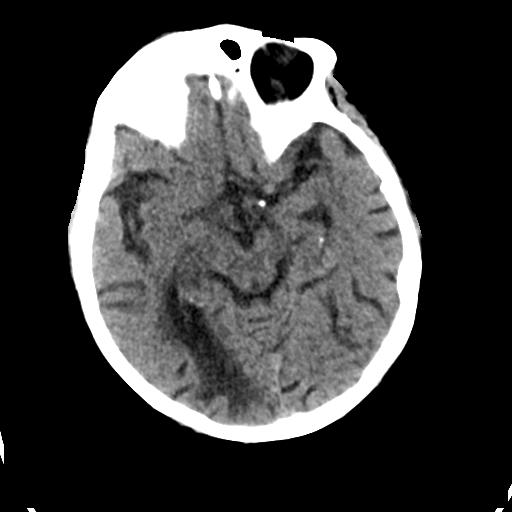
[im 18/35  brain]
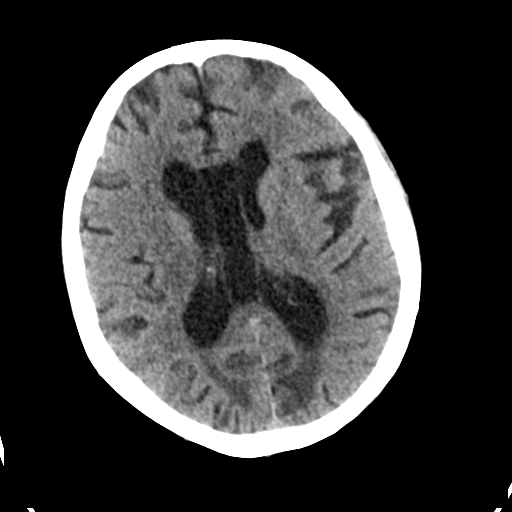
[im 22/35  brain]
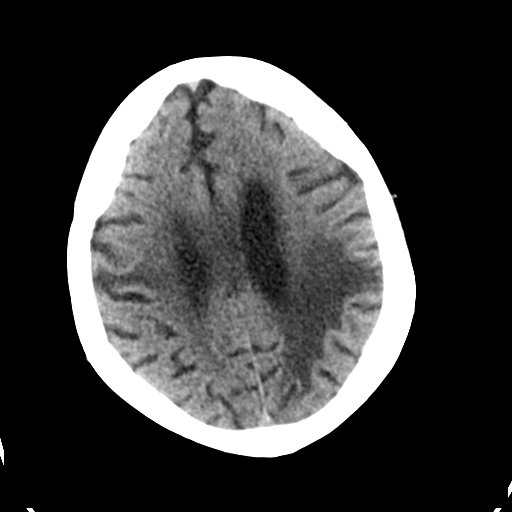
[im 22/35  bone]
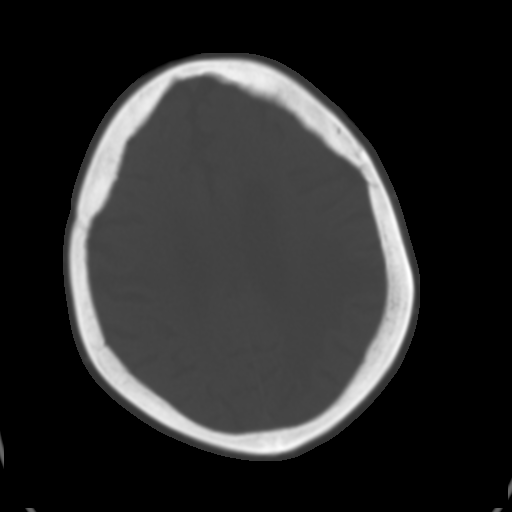
[im 26/35  brain]
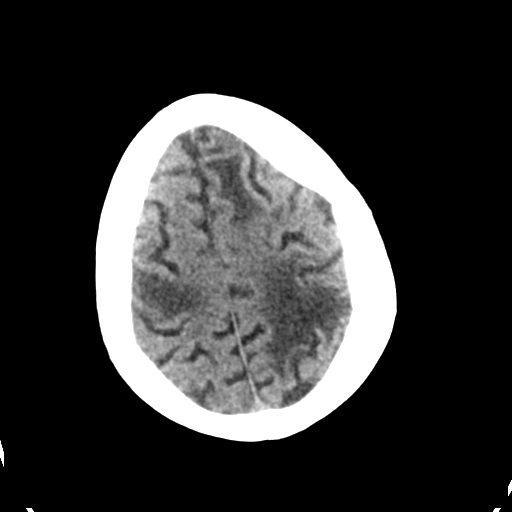
[im 30/35  brain]
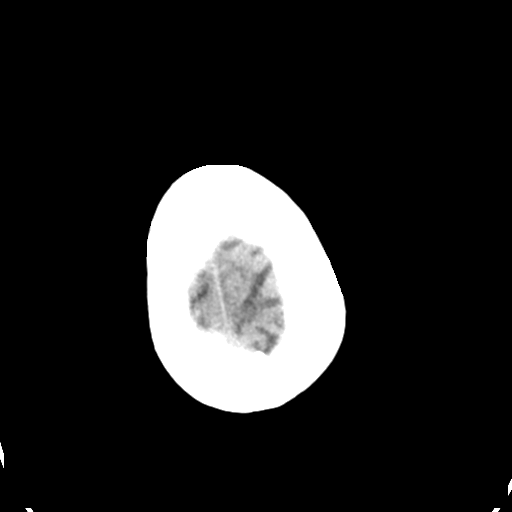

[Series 3: head bone · axial · 0.41mm/px · z∈[-103,+37]mm · 8 of 88 slices shown]
[im 9/88  bone]
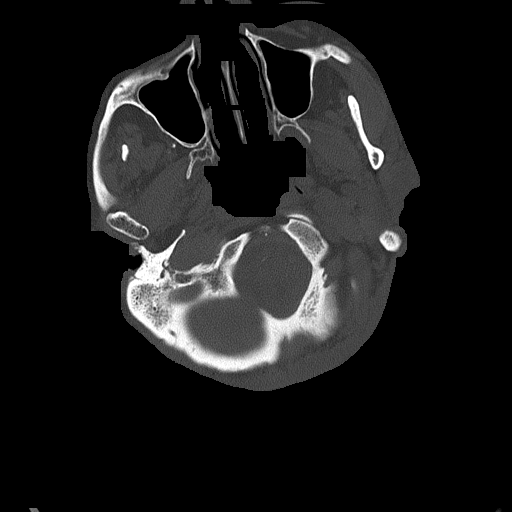
[im 18/88  bone]
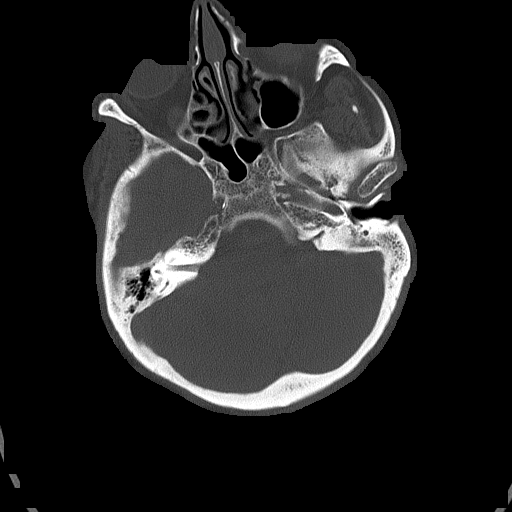
[im 27/88  bone]
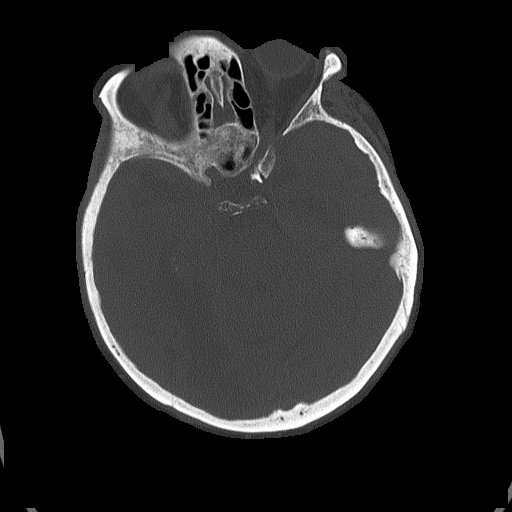
[im 40/88  bone]
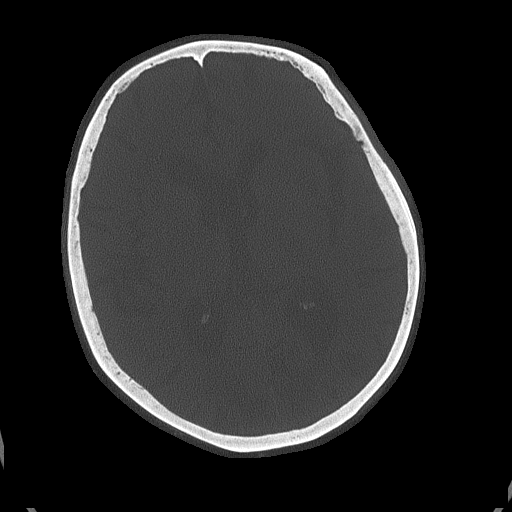
[im 48/88  bone]
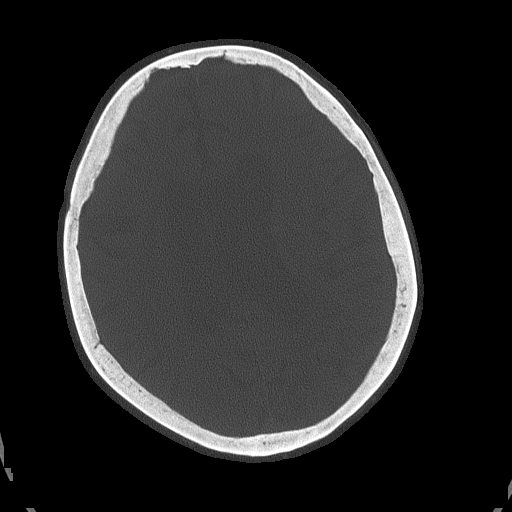
[im 61/88  bone]
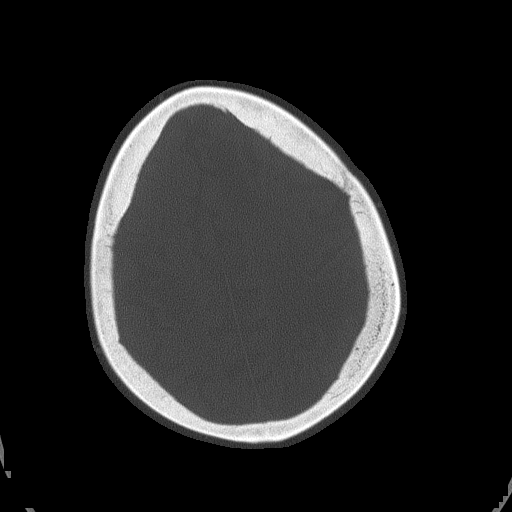
[im 70/88  bone]
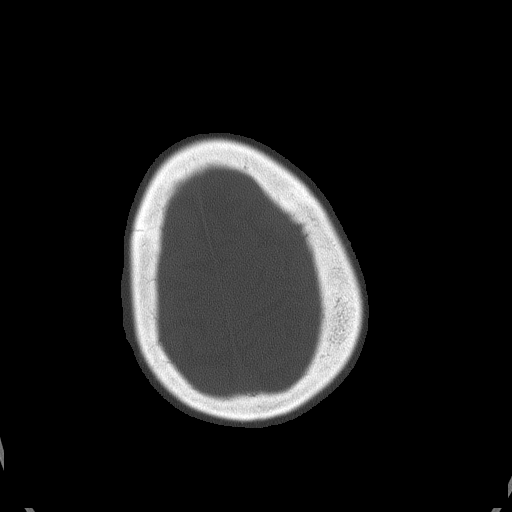
[im 79/88  bone]
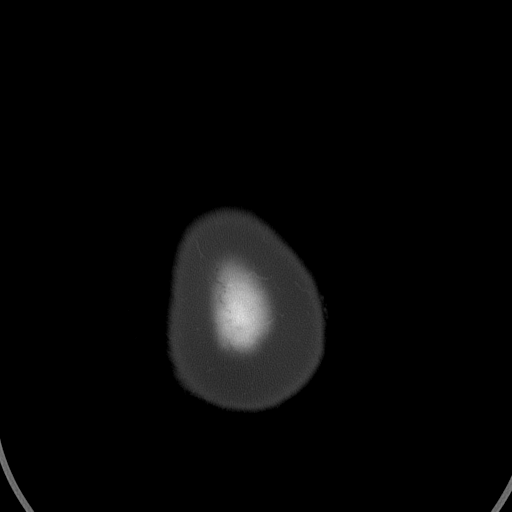

[15 of 30 positions shown; findings below may reference images not displayed]

FINDINGS: Bony calvarium is intact. No gross soft tissue abnormality is seen.
Changes consistent with prior right parietal occipital infarct are
again noted. Changes consistent with prior left middle cerebral
artery infarct are seen. Some lesser prominent changes in the right
middle cerebral artery distribution6. No acute infarct, acute
hemorrhage or space-occupying mass lesion are noted. Changes
consistent with cavum septum pellucidum are again noted.
IMPRESSION: Chronic ischemic changes without acute abnormality.

## 2018-01-13 ENCOUNTER — Encounter: Payer: Self-pay | Admitting: Internal Medicine

## 2018-01-13 NOTE — Assessment & Plan Note (Signed)
Very stable; continue metoprolol 12.5 mg twice daily, patient is not currently requiring a diuretic

## 2018-01-13 NOTE — Assessment & Plan Note (Signed)
No reports of problems; continue Zantac 75 mg nightly

## 2018-01-13 NOTE — Assessment & Plan Note (Signed)
Level stable; continue replacement 1000 units daily

## 2018-02-12 ENCOUNTER — Encounter: Payer: Self-pay | Admitting: Internal Medicine

## 2018-02-12 ENCOUNTER — Non-Acute Institutional Stay (SKILLED_NURSING_FACILITY): Payer: Medicare Other | Admitting: Internal Medicine

## 2018-02-12 DIAGNOSIS — J309 Allergic rhinitis, unspecified: Secondary | ICD-10-CM | POA: Diagnosis not present

## 2018-02-12 DIAGNOSIS — I63031 Cerebral infarction due to thrombosis of right carotid artery: Secondary | ICD-10-CM | POA: Diagnosis not present

## 2018-02-12 DIAGNOSIS — R600 Localized edema: Secondary | ICD-10-CM | POA: Diagnosis not present

## 2018-02-12 DIAGNOSIS — I10 Essential (primary) hypertension: Secondary | ICD-10-CM | POA: Diagnosis not present

## 2018-02-12 NOTE — Progress Notes (Signed)
[facility] Room Number: 305D Place of Service:  SNF (31)  [name]. [name], MD  Patient Care Team: Margit Hanks, MD as PCP - General (Internal Medicine)  Extended Emergency Contact Information Primary Emergency Contact: Gruber,[name] Address: [address]          [place], [place] [address] [name] Phone: [phone] Relation: Daughter Secondary Emergency Contact: [name] [place] Home Phone: [phone] Relation: Grandson    Allergies: Aspirin and Aspirin  Chief Complaint  Patient presents with  . Medical Management of Chronic Issues    Routine Visit    HPI: Patient is 83 y.o. female who is being seen for routine issues of hypertension, history of CVA, and allergic rhinitis.  Past Medical History:  Diagnosis Date  . Arthritis    hands  . Asthma   . Chronic diastolic CHF (congestive heart failure) (HCC)   . Chronic kidney disease (CKD), stage III (moderate) (HCC)   . Dementia (HCC)   . Depression   . Diabetes mellitus without complication (HCC)   . Diverticulosis   . Hx of adenomatous colonic polyps   . Hyperlipidemia   . Hypertension   . Hypochromic anemia   . Internal hemorrhoids   . Stroke [facility]) May 2009   multiple, last one Nov 2012, 2 strokes with right sided deficits and slurred speech  . Stroke [facility])     Past Surgical History:  Procedure Laterality Date  . ABDOMINAL HYSTERECTOMY    . ABDOMINAL SURGERY    . BOWEL RESECTION    . CHOLECYSTECTOMY N/A 09/15/2016   Procedure: LAPAROSCOPIC CHOLECYSTECTOMY WITH INTRAOPERATIVE CHOLANGIOGRAM;  Surgeon: Ovidio Kin, MD;  Location: [facility] ORS;  Service: General;  Laterality: N/A;  . COLECTOMY    . ESOPHAGOGASTRODUODENOSCOPY N/A 02/01/2016   Procedure: ESOPHAGOGASTRODUODENOSCOPY (EGD);  Surgeon: Beverley Fiedler, MD;  Location: [facility] ENDOSCOPY;  Service: Endoscopy;  Laterality: N/A;  . FLEXIBLE  SIGMOIDOSCOPY N/A 05/28/2012   Procedure: FLEXIBLE SIGMOIDOSCOPY;  Surgeon: Hart Carwin, MD;  Location: [facility] ENDOSCOPY;  Service: Endoscopy;  Laterality: N/A;  . OTHER SURGICAL HISTORY     2 ear surgeries  . PERIPHERAL VASCULAR CATHETERIZATION N/A 07/21/2014   Procedure: Abdominal Aortogram w/Lower Extremity;  Surgeon: Nada Libman, MD;  [facility]: [facility] INVASIVE CV LAB;  Service: Cardiovascular;  Laterality: N/A;  . PERIPHERAL VASCULAR CATHETERIZATION N/A 11/23/2015   Procedure: Abdominal Aortogram w/Lower Extremity;  Surgeon: Nada Libman, MD;  Location: [facility] INVASIVE CV LAB;  Service: Cardiovascular;  Laterality: N/A;  . PERIPHERAL VASCULAR CATHETERIZATION  11/23/2015   Procedure: Peripheral Vascular Intervention;  Surgeon: Nada Libman, MD;  Location: [facility] INVASIVE CV LAB;  Service: Cardiovascular;;  Failed PVI of AT and Peroneal on left side  . SUBTOTAL COLECTOMY  2010   for stricturing from bouts of diverticulitis along with hx recurrent diverticular bleeds.  the surgery was a total abdominal colec  . TONSILLECTOMY    . VASCULAR SURGERY      Allergies as of 02/12/2018      Reactions   Aspirin Nausea Only   Aspirin Nausea And Vomiting   Hurts stomach      Medication List       Accurate as of February 12, 2018 11:59 PM. Always use your most recent med list.        acetaminophen 325 MG tablet Commonly known as:  TYLENOL Take 650 mg by mouth every 6 (six) hours as needed for moderate pain.   atorvastatin 80 MG tablet Commonly known as:  LIPITOR Take 1 tablet (80 mg total) by mouth daily at 6 PM.   busPIRone 5 MG tablet Commonly known as:  BUSPAR Take 5 mg by mouth 2 (two) times daily.   clopidogrel 75 MG tablet Commonly known as:  PLAVIX Take 75 mg by mouth daily.   cycloSPORINE 0.05 % ophthalmic emulsion Commonly known as:  RESTASIS Place 1 drop into both eyes 2 (two) times daily.   GLUCERNA Liqd Take 237 mLs by mouth 3 (three) times daily between meals.     loratadine 10 MG tablet Commonly known as:  CLARITIN Take 10 mg by mouth daily.   Melatonin 5 MG Tabs Take 1 tablet by mouth at bedtime.   METOPROLOL TARTRATE PO Take 25 mg by mouth. TAKE 1/2 TABLET (12.5MG ) BY MOUTH TWICE A DAY FOR TACHYCARDIA   NORCO 5-325 MG tablet Generic drug:  HYDROcodone-acetaminophen Take 1 tablet by mouth. GIVE HALF TABLET BY MOUTH TWICE DAILY FOR ARTHRITIS PAIN   ranitidine 75 MG tablet Commonly known as:  ZANTAC Take 75 mg by mouth at bedtime.   Vitamin D3 25 MCG (1000 UT) Caps Take 1,000 Units by mouth daily.       No orders of the defined types were placed in this encounter.   Immunization History  Administered Date(s) Administered  . Influenza Split 12/01/2010, 03/24/2012  . Pneumococcal Conjugate-13 04/24/2014    Social History   Tobacco Use  . Smoking status: Never Smoker  . Smokeless tobacco: Never Used  Substance Use Topics  . Alcohol use: No    Review of Systems  DATA OBTAINED: from patient-limited; nursing-no acute concerns GENERAL:  no fevers, fatigue, appetite changes SKIN: No itching, rash HEENT: No complaint RESPIRATORY: No cough, wheezing, SOB CARDIAC: No chest pain, palpitations, lower extremity edema  GI: No abdominal pain, No N/V/D or constipation, No heartburn or reflux  GU: No dysuria, frequency or urgency, or incontinence  MUSCULOSKELETAL: No unrelieved bone/joint pain NEUROLOGIC: No headache, dizziness  PSYCHIATRIC: No overt anxiety or sadness  Vitals:   02/12/18 1224  BP: (!) 150/75  Pulse: 62  Resp: 20  Temp: (!) 97 F (36.1 C)   Body mass index is 19.43 kg/m. Physical Exam  GENERAL APPEARANCE: Alert, conversant, No acute distress  SKIN: No diaphoresis rash HEENT: Unremarkable RESPIRATORY: Breathing is even, unlabored. Lung sounds are clear   CARDIOVASCULAR: Heart RRR 2/6 systolic murmur, no rubs or gallops. 2+ peripheral edema  GASTROINTESTINAL: Abdomen is soft, non-tender, not distended w/  normal bowel sounds.  GENITOURINARY: Bladder non tender, not distended  MUSCULOSKELETAL: No abnormal joints or musculature NEUROLOGIC: Cranial nerves 2-12 grossly intact. Moves all extremities PSYCHIATRIC: Mood and affect appropriate to situation, no behavioral issues  Patient Active Problem List   Diagnosis Date Noted  . Failure to thrive in adult 12/18/2017  . Prediabetes 09/19/2017  . GERD (gastroesophageal reflux disease) 08/03/2017  . Vitamin D deficiency 08/03/2017  . Insomnia 08/03/2017  . Allergic rhinitis 08/03/2017  . Cerebral infarction (HCC)   . Stroke (HCC) 05/07/2017  . RUQ abdominal pain 09/11/2016  . Postural dizziness with presyncope 09/11/2016  . RUQ pain 09/11/2016  . Stroke (cerebrum) (HCC) 04/30/2016  . Hyperkalemia 04/30/2016  . Acute encephalopathy 04/30/2016  . Chronic diastolic CHF (congestive heart failure) (HCC) 04/30/2016  . Multiple duodenal ulcers   . RLQ abdominal pain   . Gallstones   .  Elevated liver enzymes   . Abnormal finding on GI tract imaging   . Pancreas cyst   . Choledocholithiasis 01/30/2016  . Acute kidney injury (HCC) 01/30/2016  . Non-intractable vomiting with nausea   . Bleeding 11/23/2015  . Chronic kidney disease 05/29/2012  . Skin ulcer, stage 2 (HCC) 05/29/2012  . Internal hemorrhoid, bleeding 05/29/2012  . Severe protein-calorie malnutrition (HCC) 03/24/2012  . Hyponatremia 03/23/2012  . Hypokalemia 03/23/2012  . Urinary tract infection 03/23/2012  . Physical deconditioning 03/23/2012  . CKD (chronic kidney disease) stage 3, GFR 30-59 ml/min (HCC) 12/01/2010  . Encephalopathy 12/01/2010  . CVA (cerebral vascular accident) (HCC) 11/28/2010  . ASTHMA 08/25/2008  . COLONIC POLYPS, ADENOMATOUS, HX OF 08/25/2008  . HLD (hyperlipidemia) 08/17/2008  . Microcytic hypochromic anemia 08/17/2008  . Hypertension 08/17/2008  . History of CVA (cerebrovascular accident) 08/17/2008  . DIVERTICULITIS, COLON 08/17/2008  .  HYPERGLYCEMIA 08/17/2008    CMP     Component Value Date/Time   NA 142 08/07/2017   K 4.5 08/07/2017   CL 107 05/08/2017 0645   CO2 23 05/08/2017 0645   GLUCOSE 74 05/08/2017 0645   BUN 30 (A) 08/07/2017   CREATININE 1.4 (A) 08/07/2017   CREATININE 1.58 (H) 05/08/2017 0645   CALCIUM 8.8 (L) 05/08/2017 0645   PROT 6.6 05/06/2017 1730   ALBUMIN 3.3 (L) 05/06/2017 1730   AST 21 08/07/2017   ALT 23 08/07/2017   ALKPHOS 67 08/07/2017   BILITOT 0.5 05/06/2017 1730   GFRNONAA 30 (L) 05/08/2017 0645   GFRAA 35 (L) 05/08/2017 0645   Recent Labs    03/15/17 1655 05/06/17 1701 05/06/17 1730 05/08/17 0645 08/07/17  NA 138 137 136 138 142  K 4.1 4.9 4.8 4.1 4.5  CL 105 105 105 107  --   CO2 24  --  23 23  --   GLUCOSE 178* 180* 179* 74  --   BUN 33* 25* 24* 21* 30*  CREATININE 1.61* 1.70* 1.80* 1.58* 1.4*  CALCIUM 8.9  --  9.1 8.8*  --   MG 2.3  --   --   --   --    Recent Labs    03/15/17 1655 05/06/17 1730 08/07/17  AST 23 24 21   ALT 13* 14 23  ALKPHOS 69 50 67  BILITOT 0.4 0.5  --   PROT 6.5 6.6  --   ALBUMIN 3.5 3.3*  --    Recent Labs    03/15/17 1655 05/06/17 1701 05/06/17 1730 08/07/17  WBC 4.8  --  4.8 5.1  NEUTROABS 3.7  --  3.7  --   HGB 11.6* 12.2 11.4* 10.6*  HCT 36.4 36.0 36.1 32*  MCV 87.9  --  87.6  --   PLT 198  --  199 177   Recent Labs    05/07/17 0327 08/07/17  CHOL 226* 145  LDLCALC 145* 63  TRIG 56 76   [facility]: [facility] Lab Results  Component Value Date   TSH 1.79 08/07/2017   Lab Results  Component Value Date   HGBA1C 5.6 08/07/2017   Lab Results  Component Value Date   CHOL 145 08/07/2017   HDL 66 08/07/2017   LDLCALC 63 08/07/2017   TRIG 76 08/07/2017   CHOLHDL 3.2 05/07/2017    Significant Diagnostic Results in last 30 days:  No results found.  Assessment and Plan  Hypertension Controlled; continue metoprolol 12.5 mg twice daily  CVA (cerebral vascular accident) (HCC) Chronic, stable; continue  Plavix 75 mg daily-patient has an allergy to aspirin-, patient is on statin  Allergic rhinitis Stable; continue Claritin 10 mg daily  Pedal edema- patient was started on Lasix 20 mg daily for 7 days   Lashina Milles D. [name], MD

## 2018-02-15 ENCOUNTER — Encounter: Payer: Self-pay | Admitting: Internal Medicine

## 2018-02-15 NOTE — Assessment & Plan Note (Signed)
Chronic, stable; continue Plavix 75 mg daily-patient has an allergy to aspirin-, patient is on statin

## 2018-02-15 NOTE — Assessment & Plan Note (Signed)
Controlled; continue metoprolol 12.5 mg twice daily 

## 2018-02-15 NOTE — Assessment & Plan Note (Signed)
Stable; continue Claritin 10 mg daily 

## 2018-02-18 LAB — BASIC METABOLIC PANEL
BUN: 35 — AB (ref 4–21)
CREATININE: 1.3 — AB (ref 0.5–1.1)
Glucose: 80
Potassium: 4.7 (ref 3.4–5.3)
Sodium: 139 (ref 137–147)

## 2018-02-27 LAB — CBC AND DIFFERENTIAL
HCT: 27 — AB (ref 36–46)
Hemoglobin: 9 — AB (ref 12.0–16.0)
Platelets: 176 (ref 150–399)
WBC: 5.5

## 2018-02-27 LAB — HEPATIC FUNCTION PANEL
ALT: 19 (ref 7–35)
AST: 25 (ref 13–35)
Alkaline Phosphatase: 45 (ref 25–125)
Bilirubin, Total: 0

## 2018-02-27 LAB — HEMOGLOBIN A1C: HEMOGLOBIN A1C: 5.4

## 2018-02-27 LAB — LIPID PANEL
Cholesterol: 144 (ref 0–200)
HDL: 64 (ref 35–70)
LDL Cholesterol: 70
LDl/HDL Ratio: 2.2
Triglycerides: 48 (ref 40–160)

## 2018-02-27 LAB — BASIC METABOLIC PANEL
BUN: 35 — AB (ref 4–21)
Creatinine: 1.3 — AB (ref 0.5–1.1)
Glucose: 74
POTASSIUM: 4.7 (ref 3.4–5.3)
Sodium: 145 (ref 137–147)

## 2018-02-27 LAB — TSH: TSH: 1.72 (ref 0.41–5.90)

## 2018-02-27 LAB — VITAMIN D 25 HYDROXY (VIT D DEFICIENCY, FRACTURES): Vit D, 25-Hydroxy: 36.47

## 2018-03-07 LAB — BASIC METABOLIC PANEL
BUN: 30 — AB (ref 4–21)
Creatinine: 1.4 — AB (ref 0.5–1.1)
Glucose: 146
Potassium: 4.8 (ref 3.4–5.3)
Sodium: 142 (ref 137–147)

## 2018-03-07 LAB — CBC AND DIFFERENTIAL
HCT: 31 — AB (ref 36–46)
Hemoglobin: 10.2 — AB (ref 12.0–16.0)
Platelets: 178 (ref 150–399)
WBC: 4.8

## 2018-03-07 LAB — HEPATIC FUNCTION PANEL
ALT: 24 (ref 7–35)
AST: 30 (ref 13–35)
Alkaline Phosphatase: 46 (ref 25–125)
Bilirubin, Total: 0.2

## 2018-03-07 LAB — HEMOGLOBIN A1C: Hemoglobin A1C: 5.4

## 2018-03-07 LAB — LIPID PANEL
Cholesterol: 171 (ref 0–200)
HDL: 66 (ref 35–70)
LDL CALC: 82
LDl/HDL Ratio: 2.6
Triglycerides: 112 (ref 40–160)

## 2018-03-07 LAB — TSH: TSH: 2.48 (ref 0.41–5.90)

## 2018-03-07 LAB — VITAMIN D 25 HYDROXY (VIT D DEFICIENCY, FRACTURES): Vit D, 25-Hydroxy: 37.75

## 2018-03-11 ENCOUNTER — Other Ambulatory Visit: Payer: Self-pay | Admitting: Internal Medicine

## 2018-03-11 MED ORDER — HYDROCODONE-ACETAMINOPHEN 5-325 MG PO TABS: 0.5000 | ORAL_TABLET | Freq: Two times a day (BID) | ORAL | 0 refills | Status: DC

## 2018-03-13 ENCOUNTER — Encounter: Payer: Self-pay | Admitting: Internal Medicine

## 2018-03-13 ENCOUNTER — Non-Acute Institutional Stay (SKILLED_NURSING_FACILITY): Payer: Medicare Other | Admitting: Internal Medicine

## 2018-03-13 DIAGNOSIS — F5101 Primary insomnia: Secondary | ICD-10-CM | POA: Diagnosis not present

## 2018-03-13 DIAGNOSIS — N183 Chronic kidney disease, stage 3 unspecified: Secondary | ICD-10-CM

## 2018-03-13 DIAGNOSIS — R7303 Prediabetes: Secondary | ICD-10-CM | POA: Diagnosis not present

## 2018-03-13 NOTE — Progress Notes (Signed)
[facility] Room Number: 305D Place of Service:  SNF (31)  Randon Goldsmithnne D. Lyn HollingsheadAlexander, MD  Patient Care Team: Margit HanksAlexander, Quinnten Calvin D, MD as PCP - General (Internal Medicine)  Extended Emergency Contact Information Primary Emergency Contact: Lemay,[name] Address: [address]          [address]          [place], [place] [address] [place] Phone: [phone] Relation: Daughter Secondary Emergency Contact: [name],[name]  [place] Home Phone: [phone] Relation: Grandson    Allergies: Aspirin and Aspirin  Chief Complaint  Patient presents with  . Medical Management of Chronic Issues    Routine visit    HPI: Patient is 83 y.o. female who is being seen for routine issues of insomnia, prediabetes, and chronic kidney disease stage III.  Past Medical History:  Diagnosis Date  . Arthritis    hands  . Asthma   . Chronic diastolic CHF (congestive heart failure) (HCC)   . Chronic kidney disease (CKD), stage III (moderate) (HCC)   . Dementia (HCC)   . Depression   . Diabetes mellitus without complication (HCC)   . Diverticulosis   . Hx of adenomatous colonic polyps   . Hyperlipidemia   . Hypertension   . Hypochromic anemia   . Internal hemorrhoids   . Stroke [facility](H[facility]) May 2009   multiple, last one Nov 2012, 2 strokes with right sided deficits and slurred speech  . Stroke [facility](HCC)     Past Surgical History:  Procedure Laterality Date  . ABDOMINAL HYSTERECTOMY    . ABDOMINAL SURGERY    . BOWEL RESECTION    . CHOLECYSTECTOMY N/A 09/15/2016   Procedure: LAPAROSCOPIC CHOLECYSTECTOMY WITH INTRAOPERATIVE CHOLANGIOGRAM;  Surgeon: [name], [name], MD;  Location: [facility] ORS;  Service: General;  Laterality: N/A;  . COLECTOMY    . ESOPHAGOGASTRODUODENOSCOPY N/A 02/01/2016   Procedure: ESOPHAGOGASTRODUODENOSCOPY (EGD);  Surgeon: [name], MD;  [facility];  Service: Endoscopy;  Laterality: N/A;  .  FLEXIBLE SIGMOIDOSCOPY N/A 05/28/2012   Procedure: FLEXIBLE SIGMOIDOSCOPY;  Surgeon: [name], MD;  [facility];  Service: Endoscopy;  Laterality: N/A;  . OTHER SURGICAL HISTORY     2 ear surgeries  . PERIPHERAL VASCULAR CATHETERIZATION N/A 07/21/2014   Procedure: Abdominal Aortogram w/Lower Extremity;  Surgeon: [name], MD;  [facility]: [facility] INVASIVE CV LAB;  Service: Cardiovascular;  Laterality: N/A;  . PERIPHERAL VASCULAR CATHETERIZATION N/A 11/23/2015   Procedure: Abdominal Aortogram w/Lower Extremity;  Surgeon: [name], MD;  Location: [facility] INVASIVE CV LAB;  Service: Cardiovascular;  Laterality: N/A;  . PERIPHERAL VASCULAR CATHETERIZATION  11/23/2015   Procedure: Peripheral Vascular Intervention;  Surgeon: [name], MD;  [facility]: [facility] INVASIVE CV LAB;  Service: Cardiovascular;;  Failed PVI of AT and Peroneal on left side  . SUBTOTAL COLECTOMY  2010   for stricturing from bouts of diverticulitis along with hx recurrent diverticular bleeds.  the surgery was a total abdominal colec  . TONSILLECTOMY    . VASCULAR SURGERY      Allergies as of 03/13/2018      Reactions   Aspirin Nausea Only   Aspirin Nausea And Vomiting   Hurts stomach      Medication List       Accurate as of March 13, 2018 11:59 PM. Always use your most recent med list.        acetaminophen 325 MG tablet Commonly known as:  TYLENOL Take 650 mg by mouth every 6 (six) hours as needed for moderate pain.   atorvastatin 80 MG tablet Commonly known as:  LIPITOR Take 1 tablet (80 mg total) by mouth daily at 6 PM.   busPIRone 5 MG tablet Commonly known as:  BUSPAR Take 5 mg by mouth 2 (two) times daily.   clopidogrel 75 MG tablet Commonly known as:  PLAVIX Take 75 mg by mouth daily.   cycloSPORINE 0.05 % ophthalmic emulsion Commonly known as:  RESTASIS Place 1 drop into both eyes 2 (two) times daily.   divalproex 125 MG capsule Commonly known as:  DEPAKOTE SPRINKLE Take by  mouth. TAKE 1 CAPSULE BY MOUTH ONCE DAILY FOR MOOD DISORDER (DO NOT CRUSH)   GLUCERNA Liqd Take 237 mLs by mouth 3 (three) times daily between meals.   HYDROcodone-acetaminophen 5-325 MG tablet Commonly known as:  NORCO Take 0.5 tablets by mouth 2 (two) times daily. GIVE HALF TABLET BY MOUTH TWICE DAILY FOR ARTHRITIS PAIN   loratadine 10 MG tablet Commonly known as:  CLARITIN Take 10 mg by mouth daily.   Melatonin 5 MG Tabs Take 1 tablet by mouth at bedtime.   METOPROLOL TARTRATE PO Take 25 mg by mouth. TAKE 1/2 TABLET (12.5MG ) BY MOUTH TWICE A DAY FOR TACHYCARDIA   pantoprazole 40 MG tablet Commonly known as:  PROTONIX Take 40 mg by mouth daily.   ranitidine 75 MG tablet Commonly known as:  ZANTAC Take 75 mg by mouth at bedtime.   Vitamin D3 25 MCG (1000 UT) Caps Take 1,000 Units by mouth daily.       No orders of the defined types were placed in this encounter.   Immunization History  Administered Date(s) Administered  . Influenza Split 12/01/2010, 03/24/2012  . Pneumococcal Conjugate-13 04/24/2014    Social History   Tobacco Use  . Smoking status: Never Smoker  . Smokeless tobacco: Never Used  Substance Use Topics  . Alcohol use: No    Review of Systems  DATA OBTAINED: from patient, nurse GENERAL:  no fevers, fatigue, appetite changes SKIN: No itching, rash HEENT: No complaint RESPIRATORY: No cough, wheezing, SOB CARDIAC: No chest pain, palpitations, lower extremity edema  GI: No abdominal pain, No N/V/D or constipation, No heartburn or reflux  GU: No dysuria, frequency or urgency, or incontinence  MUSCULOSKELETAL: No unrelieved bone/joint pain NEUROLOGIC: No headache, dizziness  PSYCHIATRIC: No overt anxiety or sadness  Vitals:   03/13/18 1000  BP: (!) 147/62  Pulse: 72  Resp: 18  Temp: 98.1 F (36.7 C)   Body mass index is 20.14 kg/m. Physical Exam  GENERAL APPEARANCE: Alert, conversant, No acute distress  SKIN: No diaphoresis  rash HEENT: Unremarkable RESPIRATORY: Breathing is even, unlabored. Lung sounds are clear   CARDIOVASCULAR: Heart RRR no murmurs, rubs or gallops. No peripheral edema  GASTROINTESTINAL: Abdomen is soft, non-tender, not distended w/ normal bowel sounds.  GENITOURINARY: Bladder non tender, not distended  MUSCULOSKELETAL: No abnormal joints or musculature NEUROLOGIC: Cranial nerves 2-12 grossly intact. Moves all extremities PSYCHIATRIC: Mood and affect appropriate with dementia, no behavioral issues  Patient Active Problem List   Diagnosis Date Noted  . Failure to thrive in adult 12/18/2017  . Prediabetes 09/19/2017  . GERD (gastroesophageal reflux disease) 08/03/2017  . Vitamin D deficiency 08/03/2017  . Insomnia 08/03/2017  . Allergic rhinitis 08/03/2017  . Cerebral infarction (HCC)   . Stroke (HCC) 05/07/2017  . RUQ abdominal pain 09/11/2016  . Postural dizziness with presyncope 09/11/2016  .  RUQ pain 09/11/2016  . Stroke (cerebrum) (HCC) 04/30/2016  . Hyperkalemia 04/30/2016  . Acute encephalopathy 04/30/2016  . Chronic diastolic CHF (congestive heart failure) (HCC) 04/30/2016  . Multiple duodenal ulcers   . RLQ abdominal pain   . Gallstones   . Elevated liver enzymes   . Abnormal finding on GI tract imaging   . Pancreas cyst   . Choledocholithiasis 01/30/2016  . Acute kidney injury (HCC) 01/30/2016  . Non-intractable vomiting with nausea   . Bleeding 11/23/2015  . Chronic kidney disease 05/29/2012  . Skin ulcer, stage 2 (HCC) 05/29/2012  . Internal hemorrhoid, bleeding 05/29/2012  . Severe protein-calorie malnutrition (HCC) 03/24/2012  . Hyponatremia 03/23/2012  . Hypokalemia 03/23/2012  . Urinary tract infection 03/23/2012  . Physical deconditioning 03/23/2012  . CKD (chronic kidney disease) stage 3, GFR 30-59 ml/min (HCC) 12/01/2010  . Encephalopathy 12/01/2010  . CVA (cerebral vascular accident) (HCC) 11/28/2010  . ASTHMA 08/25/2008  . COLONIC POLYPS,  ADENOMATOUS, HX OF 08/25/2008  . HLD (hyperlipidemia) 08/17/2008  . Microcytic hypochromic anemia 08/17/2008  . Hypertension 08/17/2008  . History of CVA (cerebrovascular accident) 08/17/2008  . DIVERTICULITIS, COLON 08/17/2008  . HYPERGLYCEMIA 08/17/2008    CMP     Component Value Date/Time   NA 142 03/07/2018   K 4.8 03/07/2018   CL 107 05/08/2017 0645   CO2 23 05/08/2017 0645   GLUCOSE 74 05/08/2017 0645   BUN 30 (A) 03/07/2018   CREATININE 1.4 (A) 03/07/2018   CREATININE 1.58 (H) 05/08/2017 0645   CALCIUM 8.8 (L) 05/08/2017 0645   PROT 6.6 05/06/2017 1730   ALBUMIN 3.3 (L) 05/06/2017 1730   AST 30 03/07/2018   ALT 24 03/07/2018   ALKPHOS 46 03/07/2018   BILITOT 0.5 05/06/2017 1730   GFRNONAA 30 (L) 05/08/2017 0645   GFRAA 35 (L) 05/08/2017 0645   Recent Labs    05/06/17 1701 05/06/17 1730 05/08/17 0645  02/18/18 02/27/18 03/07/18  NA 137 136 138   < > 139 145 142  K 4.9 4.8 4.1   < > 4.7 4.7 4.8  CL 105 105 107  --   --   --   --   CO2  --  23 23  --   --   --   --   GLUCOSE 180* 179* 74  --   --   --   --   BUN 25* 24* 21*   < > 35* 35* 30*  CREATININE 1.70* 1.80* 1.58*   < > 1.3* 1.3* 1.4*  CALCIUM  --  9.1 8.8*  --   --   --   --    < > = values in this interval not displayed.   Recent Labs    05/06/17 1730 08/07/17 02/27/18 03/07/18  AST 24 21 25 30   ALT 14 23 19 24   ALKPHOS 50 67 45 46  BILITOT 0.5  --   --   --   PROT 6.6  --   --   --   ALBUMIN 3.3*  --   --   --    Recent Labs    05/06/17 1730 08/07/17 02/27/18 03/07/18  WBC 4.8 5.1 5.5 4.8  NEUTROABS 3.7  --   --   --   HGB 11.4* 10.6* 9.0* 10.2*  HCT 36.1 32* 27* 31*  MCV 87.6  --   --   --   PLT 199 177 176 178   Recent Labs    08/07/17 02/27/18 03/07/18  CHOL 145 144 171  LDLCALC 63 70 82  TRIG 76 48 112   No results found for: [facility] Lab Results  Component Value Date   TSH 2.48 03/07/2018   Lab Results  Component Value Date   HGBA1C 5.4 03/07/2018   Lab Results   Component Value Date   CHOL 171 03/07/2018   HDL 66 03/07/2018   LDLCALC 82 03/07/2018   TRIG 112 03/07/2018   CHOLHDL 3.2 05/07/2017    Significant Diagnostic Results in last 30 days:  No results found.  Assessment and Plan  Insomnia No reported problems; continue melatonin 5 mg nightly  Prediabetes Recent A1c 5.4 on diet alone, very good control; patient is on statin  CKD (chronic kidney disease) stage 3, GFR 30-59 ml/min (HCC) Recent creatinine 1.4 with a GFR of 30, which is stable from prior; will monitor at intervals    Thurston Hole D. [name], MD

## 2018-03-16 ENCOUNTER — Encounter: Payer: Self-pay | Admitting: Internal Medicine

## 2018-03-16 NOTE — Assessment & Plan Note (Signed)
Recent A1c 5.4 on diet alone, very good control; patient is on statin

## 2018-03-16 NOTE — Assessment & Plan Note (Signed)
Recent creatinine 1.4 with a GFR of 30, which is stable from prior; will monitor at intervals

## 2018-03-16 NOTE — Assessment & Plan Note (Signed)
No reported problems; continue melatonin 5 mg nightly 

## 2018-04-11 ENCOUNTER — Non-Acute Institutional Stay (SKILLED_NURSING_FACILITY): Payer: Medicare Other | Admitting: Internal Medicine

## 2018-04-11 ENCOUNTER — Other Ambulatory Visit: Payer: Self-pay | Admitting: Internal Medicine

## 2018-04-11 ENCOUNTER — Encounter: Payer: Self-pay | Admitting: Internal Medicine

## 2018-04-11 DIAGNOSIS — I5032 Chronic diastolic (congestive) heart failure: Secondary | ICD-10-CM | POA: Diagnosis not present

## 2018-04-11 DIAGNOSIS — K219 Gastro-esophageal reflux disease without esophagitis: Secondary | ICD-10-CM | POA: Diagnosis not present

## 2018-04-11 DIAGNOSIS — E559 Vitamin D deficiency, unspecified: Secondary | ICD-10-CM | POA: Diagnosis not present

## 2018-04-11 MED ORDER — HYDROCODONE-ACETAMINOPHEN 5-325 MG PO TABS: 0.5000 | ORAL_TABLET | Freq: Two times a day (BID) | ORAL | 0 refills | Status: DC

## 2018-04-11 NOTE — Progress Notes (Signed)
Location:  [facility] Room Number: 305D Place of Service:  SNF (31)  [name]. Emma Hollingshead, MD  Patient Care Team: Margit Hanks, MD as PCP - General (Internal Medicine)  Extended Emergency Contact Information Primary Emergency Contact: [name] Address: [address]          [place], [place] [address] [name] Phone: [phone] Relation: Daughter Secondary Emergency Contact: [name] [place] of [place] Home Phone: [phone] Relation: Grandson    Allergies: Aspirin and Aspirin  Chief Complaint  Patient presents with  . Medical Management of Chronic Issues    Routine visit    HPI: Patient is 83 y.o. female who is being seen for routine issues of chronic diastolic congestive heart failure, GERD, and vitamin D deficiency.  Past Medical History:  Diagnosis Date  . Arthritis    hands  . Asthma   . Chronic diastolic CHF (congestive heart failure) (HCC)   . Chronic kidney disease (CKD), stage III (moderate) (HCC)   . Dementia (HCC)   . Depression   . Diabetes mellitus without complication (HCC)   . Diverticulosis   . Hx of adenomatous colonic polyps   . Hyperlipidemia   . Hypertension   . Hypochromic anemia   . Internal hemorrhoids   . Stroke [facility]) May 2009   multiple, last one Nov 2012, 2 strokes with right sided deficits and slurred speech  . Stroke [facility])     Past Surgical History:  Procedure Laterality Date  . ABDOMINAL HYSTERECTOMY    . ABDOMINAL SURGERY    . BOWEL RESECTION    . CHOLECYSTECTOMY N/A 09/15/2016   Procedure: LAPAROSCOPIC CHOLECYSTECTOMY WITH INTRAOPERATIVE CHOLANGIOGRAM;  Surgeon: Ovidio Kin, MD;  Location: [facility] ORS;  Service: General;  Laterality: N/A;  . COLECTOMY    . ESOPHAGOGASTRODUODENOSCOPY N/A 02/01/2016   Procedure: ESOPHAGOGASTRODUODENOSCOPY (EGD);  Surgeon: Beverley Fiedler, MD;  Location: [facility] ENDOSCOPY;  Service: Endoscopy;   Laterality: N/A;  . FLEXIBLE SIGMOIDOSCOPY N/A 05/28/2012   Procedure: FLEXIBLE SIGMOIDOSCOPY;  Surgeon: Hart Carwin, MD;  Location: [facility] ENDOSCOPY;  Service: Endoscopy;  Laterality: N/A;  . OTHER SURGICAL HISTORY     2 ear surgeries  . PERIPHERAL VASCULAR CATHETERIZATION N/A 07/21/2014   Procedure: Abdominal Aortogram w/Lower Extremity;  Surgeon: Nada Libman, MD;  Location: [facility] INVASIVE CV LAB;  Service: Cardiovascular;  Laterality: N/A;  . PERIPHERAL VASCULAR CATHETERIZATION N/A 11/23/2015   Procedure: Abdominal Aortogram w/Lower Extremity;  Surgeon: Nada Libman, MD;  Location: [facility] INVASIVE CV LAB;  Service: Cardiovascular;  Laterality: N/A;  . PERIPHERAL VASCULAR CATHETERIZATION  11/23/2015   Procedure: Peripheral Vascular Intervention;  Surgeon: Nada Libman, MD;  Location: [facility] INVASIVE CV LAB;  Service: Cardiovascular;;  Failed PVI of AT and Peroneal on left side  . SUBTOTAL COLECTOMY  2010   for stricturing from bouts of diverticulitis along with hx recurrent diverticular bleeds.  the surgery was a total abdominal colec  . TONSILLECTOMY    . VASCULAR SURGERY      Allergies as of 04/11/2018      Reactions   Aspirin Nausea Only   Aspirin Nausea And Vomiting   Hurts stomach      Medication List       Accurate as of April 11, 2018 11:59 PM. Always use your most recent med list.        acetaminophen 325 MG tablet Commonly  known as:  TYLENOL Take 650 mg by mouth every 6 (six) hours as needed for moderate pain.   atorvastatin 80 MG tablet Commonly known as:  LIPITOR Take 1 tablet (80 mg total) by mouth daily at 6 PM.   busPIRone 5 MG tablet Commonly known as:  BUSPAR Take 5 mg by mouth 2 (two) times daily.   clopidogrel 75 MG tablet Commonly known as:  PLAVIX Take 75 mg by mouth daily.   cycloSPORINE 0.05 % ophthalmic emulsion Commonly known as:  RESTASIS Place 1 drop into both eyes 2 (two) times daily.   divalproex 125 MG capsule Commonly known as:  DEPAKOTE  SPRINKLE Take by mouth. TAKE 1 CAPSULE BY MOUTH ONCE DAILY FOR MOOD DISORDER (DO NOT CRUSH)   Glucerna Liqd Take 237 mLs by mouth 3 (three) times daily between meals.   HYDROcodone-acetaminophen 5-325 MG tablet Commonly known as:  Norco Take 0.5 tablets by mouth 2 (two) times daily. GIVE HALF TABLET BY MOUTH TWICE DAILY FOR ARTHRITIS PAIN   loratadine 10 MG tablet Commonly known as:  CLARITIN Take 10 mg by mouth daily.   Melatonin 5 MG Tabs Take 1 tablet by mouth at bedtime.   METOPROLOL TARTRATE PO Take 25 mg by mouth. TAKE 1/2 TABLET (12.5MG ) BY MOUTH TWICE A DAY FOR TACHYCARDIA   pantoprazole 40 MG tablet Commonly known as:  PROTONIX Take 40 mg by mouth daily.   ranitidine 75 MG tablet Commonly known as:  ZANTAC Take 75 mg by mouth at bedtime.   Vitamin D3 25 MCG (1000 UT) Caps Take 1,000 Units by mouth daily.       No orders of the defined types were placed in this encounter.   Immunization History  Administered Date(s) Administered  . Influenza Split 12/01/2010, 03/24/2012  . Pneumococcal Conjugate-13 04/24/2014    Social History   Tobacco Use  . Smoking status: Never Smoker  . Smokeless tobacco: Never Used  Substance Use Topics  . Alcohol use: No    Review of Systems  DATA OBTAINED: from patient/limited; nursing-no acute concerns GENERAL:  no fevers, fatigue, appetite changes SKIN: No itching, rash HEENT: No complaint RESPIRATORY: No cough, wheezing, SOB CARDIAC: No chest pain, palpitations, lower extremity edema  GI: No abdominal pain, No N/V/D or constipation, No heartburn or reflux  GU: No dysuria, frequency or urgency, or incontinence  MUSCULOSKELETAL: No unrelieved bone/joint pain NEUROLOGIC: No headache, dizziness  PSYCHIATRIC: No overt anxiety or sadness  Vitals:   04/11/18 1010  BP: 138/66  Pulse: 67  Resp: 18  Temp: (!) 97 F (36.1 C)   Body mass index is 21.43 kg/m. Physical Exam  GENERAL APPEARANCE: Alert, conversant, No  acute distress  SKIN: No diaphoresis rash HEENT: Unremarkable RESPIRATORY: Breathing is even, unlabored. Lung sounds are clear   CARDIOVASCULAR: Heart RRR 2/6 systolic murmur, no rubs or gallops. + peripheral edema  GASTROINTESTINAL: Abdomen is soft, non-tender, not distended w/ normal bowel sounds.  GENITOURINARY: Bladder non tender, not distended  MUSCULOSKELETAL: No abnormal joints or musculature NEUROLOGIC: Cranial nerves 2-12 grossly intact. Moves all extremities PSYCHIATRIC: Mood and affect appropriate to situation, no behavioral issues  Patient Active Problem List   Diagnosis Date Noted  . Failure to thrive in adult 12/18/2017  . Prediabetes 09/19/2017  . GERD (gastroesophageal reflux disease) 08/03/2017  . Vitamin D deficiency 08/03/2017  . Insomnia 08/03/2017  . Allergic rhinitis 08/03/2017  . Cerebral infarction (HCC)   . Stroke (HCC) 05/07/2017  . RUQ abdominal pain 09/11/2016  .  Postural dizziness with presyncope 09/11/2016  . RUQ pain 09/11/2016  . Stroke (cerebrum) (HCC) 04/30/2016  . Hyperkalemia 04/30/2016  . Acute encephalopathy 04/30/2016  . Chronic diastolic CHF (congestive heart failure) (HCC) 04/30/2016  . Multiple duodenal ulcers   . RLQ abdominal pain   . Gallstones   . Elevated liver enzymes   . Abnormal finding on GI tract imaging   . Pancreas cyst   . Choledocholithiasis 01/30/2016  . Acute kidney injury (HCC) 01/30/2016  . Non-intractable vomiting with nausea   . Bleeding 11/23/2015  . Chronic kidney disease 05/29/2012  . Skin ulcer, stage 2 (HCC) 05/29/2012  . Internal hemorrhoid, bleeding 05/29/2012  . Severe protein-calorie malnutrition (HCC) 03/24/2012  . Hyponatremia 03/23/2012  . Hypokalemia 03/23/2012  . Urinary tract infection 03/23/2012  . Physical deconditioning 03/23/2012  . CKD (chronic kidney disease) stage 3, GFR 30-59 ml/min (HCC) 12/01/2010  . Encephalopathy 12/01/2010  . CVA (cerebral vascular accident) (HCC) 11/28/2010  .  ASTHMA 08/25/2008  . COLONIC POLYPS, ADENOMATOUS, HX OF 08/25/2008  . HLD (hyperlipidemia) 08/17/2008  . Microcytic hypochromic anemia 08/17/2008  . Hypertension 08/17/2008  . History of CVA (cerebrovascular accident) 08/17/2008  . DIVERTICULITIS, COLON 08/17/2008  . HYPERGLYCEMIA 08/17/2008    CMP     Component Value Date/Time   NA 142 03/07/2018   K 4.8 03/07/2018   CL 107 05/08/2017 0645   CO2 23 05/08/2017 0645   GLUCOSE 74 05/08/2017 0645   BUN 30 (A) 03/07/2018   CREATININE 1.4 (A) 03/07/2018   CREATININE 1.58 (H) 05/08/2017 0645   CALCIUM 8.8 (L) 05/08/2017 0645   PROT 6.6 05/06/2017 1730   ALBUMIN 3.3 (L) 05/06/2017 1730   AST 30 03/07/2018   ALT 24 03/07/2018   ALKPHOS 46 03/07/2018   BILITOT 0.5 05/06/2017 1730   GFRNONAA 30 (L) 05/08/2017 0645   GFRAA 35 (L) 05/08/2017 0645   Recent Labs    05/06/17 1701 05/06/17 1730 05/08/17 0645  02/18/18 02/27/18 03/07/18  NA 137 136 138   < > 139 145 142  K 4.9 4.8 4.1   < > 4.7 4.7 4.8  CL 105 105 107  --   --   --   --   CO2  --  23 23  --   --   --   --   GLUCOSE 180* 179* 74  --   --   --   --   BUN 25* 24* 21*   < > 35* 35* 30*  CREATININE 1.70* 1.80* 1.58*   < > 1.3* 1.3* 1.4*  CALCIUM  --  9.1 8.8*  --   --   --   --    < > = values in this interval not displayed.   Recent Labs    05/06/17 1730 08/07/17 02/27/18 03/07/18  AST ALT ALKPHOS 50 67 45 46  BILITOT 0.5  --   --   --   PROT 6.6  --   --   --   ALBUMIN 3.3*  --   --   --    Recent Labs    05/06/17 1730 08/07/17 02/27/18 03/07/18  WBC 4.8 5.1 5.5 4.8  NEUTROABS 3.7  --   --   --   HGB 11.4* 10.6* 9.0* 10.2*  HCT 36.1 32* 27* 31*  MCV 87.6  --   --   --   PLT 199 177 176 178   Recent Labs  08/07/17 02/27/18 03/07/18  CHOL 145 144 171  LDLCALC 63 70 82  TRIG 76 48 112   [facility] found for: [facility] Lab Results  Component Value Date   TSH 2.48 03/07/2018   Lab Results  Component Value Date    HGBA1C 5.4 03/07/2018   Lab Results  Component Value Date   CHOL 171 03/07/2018   HDL 66 03/07/2018   LDLCALC 82 03/07/2018   TRIG 112 03/07/2018   CHOLHDL 3.2 05/07/2017    Significant Diagnostic Results in last 30 days:  No results found.  Assessment and Plan  Chronic diastolic CHF (congestive heart failure) (HCC) Stable without exacerbation; continue metoprolol 12.5 mg twice daily; patient is not on diuretic  GERD (gastroesophageal reflux disease) Continues without exacerbation; continue Zantac 75 mg nightly  Vitamin D deficiency Level 37; continue thousand units daily for replacement    [name] D. Emma HollingsheadAlexander, MD

## 2018-04-19 ENCOUNTER — Encounter: Payer: Self-pay | Admitting: Internal Medicine

## 2018-04-19 NOTE — Assessment & Plan Note (Signed)
Stable without exacerbation; continue metoprolol 12.5 mg twice daily; patient is not on diuretic

## 2018-04-19 NOTE — Assessment & Plan Note (Signed)
Continues without exacerbation; continue Zantac 75 mg nightly

## 2018-04-19 NOTE — Assessment & Plan Note (Signed)
Level 37; continue thousand units daily for replacement

## 2018-05-09 ENCOUNTER — Non-Acute Institutional Stay (SKILLED_NURSING_FACILITY): Payer: Medicare Other | Admitting: Internal Medicine

## 2018-05-09 ENCOUNTER — Encounter: Payer: Self-pay | Admitting: Internal Medicine

## 2018-05-09 DIAGNOSIS — R0989 Other specified symptoms and signs involving the circulatory and respiratory systems: Secondary | ICD-10-CM | POA: Diagnosis not present

## 2018-05-09 NOTE — Progress Notes (Signed)
[facility] Room Number: 305D Place of Service:  SNF (31)  Randon Goldsmithnne D. Lyn HollingsheadAlexander, MD  Patient Care Team: Margit HanksAlexander, Baylen Dea D, MD as PCP - General (Internal Medicine)  Extended Emergency Contact Information Primary Emergency Contact: Cara,[name] Address: [address]          [address]          [place], [place] [address] [place] Phone: [phone] Relation: Daughter Secondary Emergency Contact: [name],[name]  [place] Home Phone: [phone] Relation: Grandson    Allergies: Aspirin and Aspirin  Chief Complaint  Patient presents with  . Acute Visit    HPI: Patient is 83 y.o. female who the nursing supervisor asked me to see because she had a choking episode today at breakfast.  She was choking on eggs and Aram BeechamCynthia did the Heimlich maneuver on her and the eggs came back up.  Aram BeechamCynthia has concern for aspiration pneumonia.  Past Medical History:  Diagnosis Date  . Arthritis    hands  . Asthma   . Chronic diastolic CHF (congestive heart failure) (HCC)   . Chronic kidney disease (CKD), stage III (moderate) (HCC)   . Dementia (HCC)   . Depression   . Diabetes mellitus without complication (HCC)   . Diverticulosis   . Hx of adenomatous colonic polyps   . Hyperlipidemia   . Hypertension   . Hypochromic anemia   . Internal hemorrhoids   . Stroke [facility](HCC) May 2009   multiple, last one Nov 2012, 2 strokes with right sided deficits and slurred speech  . Stroke [facility](HCC)     Past Surgical History:  Procedure Laterality Date  . ABDOMINAL HYSTERECTOMY    . ABDOMINAL SURGERY    . BOWEL RESECTION    . CHOLECYSTECTOMY N/A 09/15/2016   Procedure: LAPAROSCOPIC CHOLECYSTECTOMY WITH INTRAOPERATIVE CHOLANGIOGRAM;  Surgeon: [name], [name], MD;  Location: [facility] ORS;  Service: General;  Laterality: N/A;  . COLECTOMY    . ESOPHAGOGASTRODUODENOSCOPY N/A 02/01/2016   Procedure: ESOPHAGOGASTRODUODENOSCOPY (EGD);   Surgeon: [name], MD;  [facility];  Service: Endoscopy;  Laterality: N/A;  . FLEXIBLE SIGMOIDOSCOPY N/A 05/28/2012   Procedure: FLEXIBLE SIGMOIDOSCOPY;  Surgeon: [name], MD;  [facility];  Service: Endoscopy;  Laterality: N/A;  . OTHER SURGICAL HISTORY     2 ear surgeries  . PERIPHERAL VASCULAR CATHETERIZATION N/A 07/21/2014   Procedure: Abdominal Aortogram w/Lower Extremity;  Surgeon: [name], MD;  [facility]: [facility] INVASIVE CV LAB;  Service: Cardiovascular;  Laterality: N/A;  . PERIPHERAL VASCULAR CATHETERIZATION N/A 11/23/2015   Procedure: Abdominal Aortogram w/Lower Extremity;  Surgeon: [name], MD;  [facility]: [facility]VASIVE CV LAB;  Service: Cardiovascular;  Laterality: N/A;  . PERIPHERAL VASCULAR CATHETERIZATION  11/23/2015   Procedure: Peripheral Vascular Intervention;  Surgeon: [name], MD;  [facility]AS[facility] CV LAB;  Service: Cardiovascular;;  Failed PVI of AT and Peroneal on left side  . SUBTOTAL COLECTOMY  2010   for stricturing from bouts of diverticulitis along with hx recurrent diverticular bleeds.  the surgery was a total abdominal colec  . TONSILLECTOMY    . VASCULAR SURGERY      Allergies as of 05/09/2018      Reactions   Aspirin Nausea Only   Aspirin Nausea And Vomiting   Hurts stomach      Medication List       Accurate as of May 09, 2018 11:59 PM. Always  use your most recent med list.        acetaminophen 325 MG tablet Commonly known as:  TYLENOL Take 650 mg by mouth every 6 (six) hours as needed for moderate pain.   atorvastatin 80 MG tablet Commonly known as:  LIPITOR Take 1 tablet (80 mg total) by mouth daily at 6 PM.   busPIRone 5 MG tablet Commonly known as:  BUSPAR Take 5 mg by mouth 2 (two) times daily.   clopidogrel 75 MG tablet Commonly known as:  PLAVIX Take 75 mg by mouth daily.   cycloSPORINE 0.05 % ophthalmic emulsion Commonly known as:  RESTASIS Place 1 drop into both eyes 2 (two)  times daily.   divalproex 125 MG capsule Commonly known as:  DEPAKOTE SPRINKLE Take by mouth. TAKE 1 CAPSULE BY MOUTH ONCE DAILY FOR MOOD DISORDER (DO NOT CRUSH)   famotidine 20 MG tablet Commonly known as:  PEPCID Take 20 mg by mouth at bedtime.   Glucerna Liqd Take 237 mLs by mouth 3 (three) times daily between meals.   HYDROcodone-acetaminophen 5-325 MG tablet Commonly known as:  Norco Take 0.5 tablets by mouth 2 (two) times daily. GIVE HALF TABLET BY MOUTH TWICE DAILY FOR ARTHRITIS PAIN   loratadine 10 MG tablet Commonly known as:  CLARITIN Take 10 mg by mouth daily.   Melatonin 5 MG Tabs Take 1 tablet by mouth at bedtime.   METOPROLOL TARTRATE PO Take 25 mg by mouth. TAKE 1/2 TABLET (12.5MG ) BY MOUTH TWICE A DAY FOR TACHYCARDIA   pantoprazole 40 MG tablet Commonly known as:  PROTONIX Take 40 mg by mouth daily.   Vitamin D3 25 MCG (1000 UT) Caps Take 1,000 Units by mouth daily.       No orders of the defined types were placed in this encounter.   Immunization History  Administered Date(s) Administered  . Influenza Split 12/01/2010, 03/24/2012  . Pneumococcal Conjugate-13 04/24/2014    Social History   Tobacco Use  . Smoking status: Never Smoker  . Smokeless tobacco: Never Used  Substance Use Topics  . Alcohol use: No    Review of Systems  DATA OBTAINED: from patient- limited; nursing- as per history of present illness GENERAL:  no fevers, fatigue, appetite changes SKIN: No itching, rash HEENT: No complaint RESPIRATORY: No cough, wheezing, SOB; did choke on her eggs this morning which were expelled with the Heimlich maneuver CARDIAC: No chest pain, palpitations, lower extremity edema  GI: No abdominal pain, No N/V/D or constipation, No heartburn or reflux  GU: No dysuria, frequency or urgency, or incontinence  MUSCULOSKELETAL: No unrelieved bone/joint pain NEUROLOGIC: No headache, dizziness  PSYCHIATRIC: No overt anxiety or sadness  Vitals:    05/09/18 1533  BP: (!) 146/70  Pulse: 88  Resp: 16  Temp: 97.7 F (36.5 C)   Body mass index is 19.79 kg/m. Physical Exam  GENERAL APPEARANCE: Alert, conversant, No acute distress  SKIN: No diaphoresis rash HEENT: Unremarkable RESPIRATORY: Breathing is even, unlabored. Lung sounds are clear but very decreased CARDIOVASCULAR: Heart RRR 2/6 systolic murmur, no rubs or gallops. No peripheral edema  GASTROINTESTINAL: Abdomen is soft, non-tender, not distended w/ normal bowel sounds.  GENITOURINARY: Bladder non tender, not distended  MUSCULOSKELETAL: No abnormal joints or musculature NEUROLOGIC: Cranial nerves 2-12 grossly intact. Moves all extremities PSYCHIATRIC: Mood and affect appropriate with dementia, no behavioral issues  Patient Active Problem List   Diagnosis Date Noted  . Failure to thrive in adult 12/18/2017  . Prediabetes 09/19/2017  .  GERD (gastroesophageal reflux disease) 08/03/2017  . Vitamin D deficiency 08/03/2017  . Insomnia 08/03/2017  . Allergic rhinitis 08/03/2017  . Cerebral infarction (HCC)   . Stroke (HCC) 05/07/2017  . RUQ abdominal pain 09/11/2016  . Postural dizziness with presyncope 09/11/2016  . RUQ pain 09/11/2016  . Stroke (cerebrum) (HCC) 04/30/2016  . Hyperkalemia 04/30/2016  . Acute encephalopathy 04/30/2016  . Chronic diastolic CHF (congestive heart failure) (HCC) 04/30/2016  . Multiple duodenal ulcers   . RLQ abdominal pain   . Gallstones   . Elevated liver enzymes   . Abnormal finding on GI tract imaging   . Pancreas cyst   . Choledocholithiasis 01/30/2016  . Acute kidney injury (HCC) 01/30/2016  . Non-intractable vomiting with nausea   . Bleeding 11/23/2015  . Chronic kidney disease 05/29/2012  . Skin ulcer, stage 2 (HCC) 05/29/2012  . Internal hemorrhoid, bleeding 05/29/2012  . Severe protein-calorie malnutrition (HCC) 03/24/2012  . Hyponatremia 03/23/2012  . Hypokalemia 03/23/2012  . Urinary tract infection 03/23/2012  .  Physical deconditioning 03/23/2012  . CKD (chronic kidney disease) stage 3, GFR 30-59 ml/min (HCC) 12/01/2010  . Encephalopathy 12/01/2010  . CVA (cerebral vascular accident) (HCC) 11/28/2010  . ASTHMA 08/25/2008  . COLONIC POLYPS, ADENOMATOUS, HX OF 08/25/2008  . HLD (hyperlipidemia) 08/17/2008  . Microcytic hypochromic anemia 08/17/2008  . Hypertension 08/17/2008  . History of CVA (cerebrovascular accident) 08/17/2008  . DIVERTICULITIS, COLON 08/17/2008  . HYPERGLYCEMIA 08/17/2008    CMP     Component Value Date/Time   NA 142 03/07/2018   K 4.8 03/07/2018   CL 107 05/08/2017 0645   CO2 23 05/08/2017 0645   GLUCOSE 74 05/08/2017 0645   BUN 30 (A) 03/07/2018   CREATININE 1.4 (A) 03/07/2018   CREATININE 1.58 (H) 05/08/2017 0645   CALCIUM 8.8 (L) 05/08/2017 0645   PROT 6.6 05/06/2017 1730   ALBUMIN 3.3 (L) 05/06/2017 1730   AST 30 03/07/2018   ALT 24 03/07/2018   ALKPHOS 46 03/07/2018   BILITOT 0.5 05/06/2017 1730   GFRNONAA 30 (L) 05/08/2017 0645   GFRAA 35 (L) 05/08/2017 0645   Recent Labs    02/18/18 02/27/18 03/07/18  NA 139 145 142  K 4.7 4.7 4.8  BUN 35* 35* 30*  CREATININE 1.3* 1.3* 1.4*   Recent Labs    08/07/17 02/27/18 03/07/18  AST 21 25 30   ALT 23 19 24   ALKPHOS 67 45 46   Recent Labs    08/07/17 02/27/18 03/07/18  WBC 5.1 5.5 4.8  HGB 10.6* 9.0* 10.2*  HCT 32* 27* 31*  PLT 177 176 178   Recent Labs    08/07/17 02/27/18 03/07/18  CHOL 145 144 171  LDLCALC 63 70 82  TRIG 76 48 112   No results found for: [facility] Lab Results  Component Value Date   TSH 2.48 03/07/2018   Lab Results  Component Value Date   HGBA1C 5.4 03/07/2018   Lab Results  Component Value Date   CHOL 171 03/07/2018   HDL 66 03/07/2018   LDLCALC 82 03/07/2018   TRIG 112 03/07/2018   CHOLHDL 3.2 05/07/2017    Significant Diagnostic Results in last 30 days:  No results found.  Assessment and Plan  Choking episode- successfully terminated with the Heimlich  maneuver; per nursing patient had lots of secretions; will monitor for signs and symptoms of pneumonia     Randon Goldsmith. [name], MD

## 2018-05-10 ENCOUNTER — Other Ambulatory Visit: Payer: Self-pay | Admitting: Internal Medicine

## 2018-05-10 MED ORDER — HYDROCODONE-ACETAMINOPHEN 5-325 MG PO TABS: 0.5000 | ORAL_TABLET | Freq: Two times a day (BID) | ORAL | 0 refills | Status: DC

## 2018-05-11 ENCOUNTER — Encounter: Payer: Self-pay | Admitting: Internal Medicine

## 2018-05-15 ENCOUNTER — Encounter: Payer: Self-pay | Admitting: Internal Medicine

## 2018-05-15 ENCOUNTER — Non-Acute Institutional Stay (SKILLED_NURSING_FACILITY): Payer: Medicare Other | Admitting: Internal Medicine

## 2018-05-15 DIAGNOSIS — J309 Allergic rhinitis, unspecified: Secondary | ICD-10-CM | POA: Diagnosis not present

## 2018-05-15 DIAGNOSIS — I63031 Cerebral infarction due to thrombosis of right carotid artery: Secondary | ICD-10-CM | POA: Diagnosis not present

## 2018-05-15 DIAGNOSIS — I10 Essential (primary) hypertension: Secondary | ICD-10-CM

## 2018-05-15 NOTE — Progress Notes (Signed)
[facility] Room Number: 305D Place of Service:  SNF ([id number])  Randon Goldsmithnne D. Lyn HollingsheadAlexander, MD  Patient Care Team: Margit HanksAlexander, Ytzel Gubler D, MD as PCP - General (Internal Medicine)  Extended Emergency Contact Information Primary Emergency Contact: Girouard,[name] Address: [address]          [address]          [place], [place] [address] [place] Mobile Phone: [phone] Relation: Daughter Secondary Emergency Contact: [name],[name]  [place] Home Phone: [phone] Relation: Grandson    Allergies: Aspirin and Aspirin  Chief Complaint  Patient presents with   Medical Management of Chronic Issues    Routine Visit    HPI: Patient is 83 y.o. female who is being seen for routine issues of hypertension, history of CVA, and allergic rhinitis.  Past Medical History:  Diagnosis Date   Arthritis    hands   Asthma    Chronic diastolic CHF (congestive heart failure) (HCC)    Chronic kidney disease (CKD), stage III (moderate) (HCC)    Dementia (HCC)    Depression    Diabetes mellitus without complication (HCC)    Diverticulosis    Hx of adenomatous colonic polyps    Hyperlipidemia    Hypertension    Hypochromic anemia    Internal hemorrhoids    Stroke [facility](HCC) May 2009   multiple, last one Nov 2012, 2 strokes with right sided deficits and slurred speech   Stroke [facility](HCC)     Past Surgical History:  Procedure Laterality Date   ABDOMINAL HYSTERECTOMY     ABDOMINAL SURGERY     BOWEL RESECTION     CHOLECYSTECTOMY N/A 09/15/2016   Procedure: LAPAROSCOPIC CHOLECYSTECTOMY WITH INTRAOPERATIVE CHOLANGIOGRAM;  Surgeon: [name], [name], MD;  Location: [facility] ORS;  Service: General;  Laterality: N/A;   COLECTOMY     ESOPHAGOGASTRODUODENOSCOPY N/A 02/01/2016   Procedure: ESOPHAGOGASTRODUODENOSCOPY (EGD);  Surgeon: [name], MD;  [facility];  Service: Endoscopy;  Laterality: N/A;   FLEXIBLE  SIGMOIDOSCOPY N/A 05/28/2012   Procedure: FLEXIBLE SIGMOIDOSCOPY;  Surgeon: [name], MD;  [facility];  Service: Endoscopy;  Laterality: N/A;   OTHER SURGICAL HISTORY     2 ear surgeries   PERIPHERAL VASCULAR CATHETERIZATION N/A 07/21/2014   Procedure: Abdominal Aortogram w/Lower Extremity;  Surgeon: [name], MD;  [facility]: [facility] INVASIVE CV LAB;  Service: Cardiovascular;  Laterality: N/A;   PERIPHERAL VASCULAR CATHETERIZATION N/A 11/23/2015   Procedure: Abdominal Aortogram w/Lower Extremity;  Surgeon: [name], MD;  Location: [facility] INV[facility] CV LAB;  Service: Cardiovascular;  Laterality: N/A;   PERIPHERAL VASCULAR CATHETERIZATION  11/23/2015   Procedure: Peripheral Vascular Intervention;  Surgeon: [name], MD;  [facility]: [facility] CV LAB;  Service: Cardiovascular;;  Failed PVI of AT and Peroneal on left side   SUBTOTAL COLECTOMY  2010   for stricturing from bouts of diverticulitis along with hx recurrent diverticular bleeds.  the surgery was a total abdominal colec   TONSILLECTOMY     VASCULAR SURGERY      Allergies as of 05/15/2018      Reactions   Aspirin Nausea Only   Aspirin Nausea And Vomiting   Hurts stomach      Medication List       Accurate as of May 15, 2018 11:59 PM. Always use your most recent med list.        acetaminophen 325 MG tablet Commonly known as:  TYLENOL Take 650 mg by mouth every 4 (four) hours as needed for moderate pain.   atorvastatin 80 MG tablet Commonly known as:  LIPITOR Take 1 tablet (80 mg total) by mouth daily at 6 PM.   busPIRone 5 MG tablet Commonly known as:  BUSPAR Take 5 mg by mouth 2 (two) times daily.   clopidogrel 75 MG tablet Commonly known as:  PLAVIX Take 75 mg by mouth daily.   cycloSPORINE 0.05 % ophthalmic emulsion Commonly known as:  RESTASIS Place 1 drop into both eyes 2 (two) times daily.   divalproex 125 MG capsule Commonly known as:  DEPAKOTE SPRINKLE Take by mouth. TAKE 1  CAPSULE BY MOUTH ONCE DAILY FOR MOOD DISORDER (DO NOT CRUSH)   famotidine 20 MG tablet Commonly known as:  PEPCID Take 20 mg by mouth at bedtime.   Glucerna Liqd Take 237 mLs by mouth 3 (three) times daily between meals.   HYDROcodone-acetaminophen 5-325 MG tablet Commonly known as:  Norco Take 0.5 tablets by mouth 2 (two) times daily. GIVE HALF TABLET BY MOUTH TWICE DAILY FOR ARTHRITIS PAIN   loratadine 10 MG tablet Commonly known as:  CLARITIN Take 10 mg by mouth daily.   Melatonin 5 MG Tabs Take 1 tablet by mouth at bedtime.   METOPROLOL TARTRATE PO Take 25 mg by mouth. TAKE 1/2 TABLET (12.5MG ) BY MOUTH TWICE A DAY FOR TACHYCARDIA   pantoprazole 40 MG tablet Commonly known as:  PROTONIX Take 40 mg by mouth daily.   Vitamin D3 25 MCG (1000 UT) Caps Take 1,000 Units by mouth daily.       No orders of the defined types were placed in this encounter.   Immunization History  Administered Date(s) Administered   Influenza Split 12/01/2010, 03/24/2012   Pneumococcal Conjugate-13 04/24/2014    Social History   Tobacco Use   Smoking status: Never Smoker   Smokeless tobacco: Never Used  Substance Use Topics   Alcohol use: No    Review of Systems  DATA OBTAINED: from patient, nurse GENERAL:  no fevers, fatigue, appetite changes SKIN: No itching, rash HEENT: No complaint RESPIRATORY: No cough, wheezing, SOB CARDIAC: No chest pain, palpitations, lower extremity edema  GI: No abdominal pain, No N/V/D or constipation, No heartburn or reflux  GU: No dysuria, frequency or urgency, or incontinence  MUSCULOSKELETAL: No unrelieved bone/joint pain NEUROLOGIC: No headache, dizziness  PSYCHIATRIC: No overt anxiety or sadness  Vitals:   05/15/18 1301  BP: (!) 130/56  Pulse: 63  Resp: 18  Temp: 97.6 F (36.4 C)   Body mass index is 19.66 kg/m. Physical Exam  GENERAL APPEARANCE: Alert, conversant, No acute distress  SKIN: No diaphoresis rash HEENT:  Unremarkable RESPIRATORY: Breathing is even, unlabored. Lung sounds are clear   CARDIOVASCULAR: Heart RRR no murmurs, rubs or gallops. No peripheral edema  GASTROINTESTINAL: Abdomen is soft, non-tender, not distended w/ normal bowel sounds.  GENITOURINARY: Bladder non tender, not distended  MUSCULOSKELETAL: No abnormal joints or musculature NEUROLOGIC: Cranial nerves 2-12 grossly intact. Moves all extremities PSYCHIATRIC: Mood and affect appropriate with dementia, no behavioral issues  Patient Active Problem List   Diagnosis Date Noted   Failure to thrive in adult 12/18/2017   Prediabetes 09/19/2017   GERD (gastroesophageal reflux disease) 08/03/2017   Vitamin D deficiency 08/03/2017   Insomnia 08/03/2017   Allergic rhinitis 08/03/2017   Cerebral infarction [facility])    Stroke (HCC) 05/07/2017   RUQ abdominal pain 09/11/2016   Postural dizziness with presyncope 09/11/2016  RUQ pain 09/11/2016   Stroke (cerebrum) (HCC) 04/30/2016   Hyperkalemia 04/30/2016   Acute encephalopathy 04/30/2016   Chronic diastolic CHF (congestive heart failure) (HCC) 04/30/2016   Multiple duodenal ulcers    RLQ abdominal pain    Gallstones    Elevated liver enzymes    Abnormal finding on GI tract imaging    Pancreas cyst    Choledocholithiasis 01/30/2016   Acute kidney injury (HCC) 01/30/2016   Non-intractable vomiting with nausea    Bleeding 11/23/2015   Chronic kidney disease 05/29/2012   Skin ulcer, stage 2 (HCC) 05/29/2012   Internal hemorrhoid, bleeding 05/29/2012   Severe protein-calorie malnutrition (HCC) 03/24/2012   Hyponatremia 03/23/2012   Hypokalemia 03/23/2012   Urinary tract infection 03/23/2012   Physical deconditioning 03/23/2012   CKD (chronic kidney disease) stage 3, GFR 30-59 ml/min (HCC) 12/01/2010   Encephalopathy 12/01/2010   CVA (cerebral vascular accident) (HCC) 11/28/2010   ASTHMA 08/25/2008   COLONIC POLYPS, ADENOMATOUS, HX OF  08/25/2008   HLD (hyperlipidemia) 08/17/2008   Microcytic hypochromic anemia 08/17/2008   Hypertension 08/17/2008   History of CVA (cerebrovascular accident) 08/17/2008   DIVERTICULITIS, COLON 08/17/2008   HYPERGLYCEMIA 08/17/2008    CMP     Component Value Date/Time   NA 142 03/07/2018   K 4.8 03/07/2018   CL 107 05/08/2017 0645   CO2 23 05/08/2017 0645   GLUCOSE 74 05/08/2017 0645   BUN 30 (A) 03/07/2018   CREATININE 1.4 (A) 03/07/2018   CREATININE 1.58 (H) 05/08/2017 0645   CALCIUM 8.8 (L) 05/08/2017 0645   PROT 6.6 05/06/2017 1730   ALBUMIN 3.3 (L) 05/06/2017 1730   AST 30 03/07/2018   ALT 24 03/07/2018   ALKPHOS 46 03/07/2018   BILITOT 0.5 05/06/2017 1730   GFRNONAA 30 (L) 05/08/2017 0645   GFRAA 35 (L) 05/08/2017 0645   Recent Labs    02/18/18 02/27/18 03/07/18  NA 139 145 142  K 4.7 4.7 4.8  BUN 35* 35* 30*  CREATININE 1.3* 1.3* 1.4*   Recent Labs    08/07/17 02/27/18 03/07/18  AST 21 25 30   ALT 23 19 24   ALKPHOS 67 45 46   Recent Labs    08/07/17 02/27/18 03/07/18  WBC 5.1 5.5 4.8  HGB 10.6* 9.0* 10.2*  HCT 32* 27* 31*  PLT 177 176 178   Recent Labs    08/07/17 02/27/18 03/07/18  CHOL 145 144 171  LDLCALC 63 70 82  TRIG 76 48 112   No results found for: [facility] Lab Results  Component Value Date   TSH 2.48 03/07/2018   Lab Results  Component Value Date   HGBA1C 5.4 03/07/2018   Lab Results  Component Value Date   CHOL 171 03/07/2018   HDL 66 03/07/2018   LDLCALC 82 03/07/2018   TRIG 112 03/07/2018   CHOLHDL 3.2 05/07/2017    Significant Diagnostic Results in last 30 days:  No results found.  Assessment and Plan  Hypertension Chronic and stable; continue metoprolol 12.5 mg twice daily  CVA (cerebral vascular accident) (HCC) Stable; patient has allergy to aspirin so is on Plavix 75 mg daily, and is on statin  Allergic rhinitis Stable; continue Claritin 10 mg daily     Gisell Buehrle D. [name], MD

## 2018-05-16 ENCOUNTER — Non-Acute Institutional Stay (SKILLED_NURSING_FACILITY): Payer: Medicare Other | Admitting: Internal Medicine

## 2018-05-16 DIAGNOSIS — I10 Essential (primary) hypertension: Secondary | ICD-10-CM

## 2018-05-16 DIAGNOSIS — R031 Nonspecific low blood-pressure reading: Secondary | ICD-10-CM | POA: Diagnosis not present

## 2018-05-18 ENCOUNTER — Encounter: Payer: Self-pay | Admitting: Internal Medicine

## 2018-05-18 NOTE — Assessment & Plan Note (Signed)
Stable; patient has allergy to aspirin so is on Plavix 75 mg daily, and is on statin

## 2018-05-18 NOTE — Assessment & Plan Note (Signed)
Stable; continue Claritin 10 mg daily

## 2018-05-18 NOTE — Assessment & Plan Note (Signed)
Chronic and stable; continue metoprolol 12.5 mg twice daily 

## 2018-05-19 ENCOUNTER — Encounter: Payer: Self-pay | Admitting: Internal Medicine

## 2018-05-19 NOTE — Progress Notes (Signed)
Location:   [facility]:   SNF  [name], Randon Goldsmith, MD  Patient Care Team: Margit Hanks, MD as PCP - General (Internal Medicine)  Extended Emergency Contact Information Primary Emergency Contact: Wimbush,[name] Address: [address]          [place], [place] [address] [name] Phone: [phone] Relation: Daughter Secondary Emergency Contact: [name] [place] Home Phone: [phone] Relation: Grandson    Allergies: Aspirin and Aspirin  Chief Complaint  Patient presents with  . Acute Visit    HPI: Patient is 83 y.o. female who nursing asked me to see for low blood pressure.  Apparently patient is not having any symptoms from this, it was just noted by her.  Past Medical History:  Diagnosis Date  . Arthritis    hands  . Asthma   . Chronic diastolic CHF (congestive heart failure) (HCC)   . Chronic kidney disease (CKD), stage III (moderate) (HCC)   . Dementia (HCC)   . Depression   . Diabetes mellitus without complication (HCC)   . Diverticulosis   . Hx of adenomatous colonic polyps   . Hyperlipidemia   . Hypertension   . Hypochromic anemia   . Internal hemorrhoids   . Stroke [facility]) May 2009   multiple, last one Nov 2012, 2 strokes with right sided deficits and slurred speech  . Stroke [facility])     Past Surgical History:  Procedure Laterality Date  . ABDOMINAL HYSTERECTOMY    . ABDOMINAL SURGERY    . BOWEL RESECTION    . CHOLECYSTECTOMY N/A 09/15/2016   Procedure: LAPAROSCOPIC CHOLECYSTECTOMY WITH INTRAOPERATIVE CHOLANGIOGRAM;  Surgeon: Ovidio Kin, MD;  Location: [facility]S;  Service: General;  Laterality: N/A;  . COLECTOMY    . ESOPHAGOGASTRODUODENOSCOPY N/A 02/01/2016   Procedure: ESOPHAGOGASTRODUODENOSCOPY (EGD);  Surgeon: Beverley Fiedler, MD;  Location: [facility] ENDOSCOPY;  Service: Endoscopy;  Laterality: N/A;  . FLEXIBLE SIGMOIDOSCOPY N/A 05/28/2012   Procedure: FLEXIBLE  SIGMOIDOSCOPY;  Surgeon: Hart Carwin, MD;  Location[facility]COPY;  Service: Endoscopy;  Laterality: N/A;  . OTHER SURGICAL HISTORY     2 ear surgeries  . PERIPHERAL VASCULAR CATHETERIZATION N/A 07/21/2014   Procedure: Abdominal Aortogram w/Lower Extremity;  Surgeon: Nada Libman, MD;  [facility]: [facility]VAS[facility] CV LAB;  Service: Cardiovascular;  Laterality: N/A;  . PERIPHERAL VASCULAR CATHETERIZATION N/A 11/23/2015   Procedure: Abdominal Aortogram w/Lower Extremity;  Surgeon: Nada Libman, MD;  Location: [facility] INVASIVE CV LAB;  Service: Cardiovascular;  Laterality: N/A;  . PERIPHERAL VASCULAR CATHETERIZATION  11/23/2015   Procedure: Peripheral Vascular Intervention;  Surgeon: Nada Libman, MD;  [facility]: [facility] CV LAB;  Service: Cardiovascular;;  Failed PVI of AT and Peroneal on left side  . SUBTOTAL COLECTOMY  2010   for stricturing from bouts of diverticulitis along with hx recurrent diverticular bleeds.  the surgery was a total abdominal colec  . TONSILLECTOMY    . VASCULAR SURGERY      Allergies as of 05/16/2018      Reactions   Aspirin Nausea Only   Aspirin Nausea And Vomiting   Hurts stomach      Medication List       Accurate as of May 16, 2018 11:59 PM. Always use your most recent med list.        acetaminophen 325 MG tablet Commonly known as:  TYLENOL Take 650  mg by mouth every 4 (four) hours as needed for moderate pain.   atorvastatin 80 MG tablet Commonly known as:  LIPITOR Take 1 tablet (80 mg total) by mouth daily at 6 PM.   busPIRone 5 MG tablet Commonly known as:  BUSPAR Take 5 mg by mouth 2 (two) times daily.   clopidogrel 75 MG tablet Commonly known as:  PLAVIX Take 75 mg by mouth daily.   cycloSPORINE 0.05 % ophthalmic emulsion Commonly known as:  RESTASIS Place 1 drop into both eyes 2 (two) times daily.   divalproex 125 MG capsule Commonly known as:  DEPAKOTE SPRINKLE Take by mouth. TAKE 1 CAPSULE BY MOUTH ONCE DAILY FOR MOOD DISORDER (DO  NOT CRUSH)   famotidine 20 MG tablet Commonly known as:  PEPCID Take 20 mg by mouth at bedtime.   Glucerna Liqd Take 237 mLs by mouth 3 (three) times daily between meals.   HYDROcodone-acetaminophen 5-325 MG tablet Commonly known as:  Norco Take 0.5 tablets by mouth 2 (two) times daily. GIVE HALF TABLET BY MOUTH TWICE DAILY FOR ARTHRITIS PAIN   loratadine 10 MG tablet Commonly known as:  CLARITIN Take 10 mg by mouth daily.   Melatonin 5 MG Tabs Take 1 tablet by mouth at bedtime.   METOPROLOL TARTRATE PO Take 25 mg by mouth. TAKE 1/2 TABLET (12.5MG ) BY MOUTH TWICE A DAY FOR TACHYCARDIA   pantoprazole 40 MG tablet Commonly known as:  PROTONIX Take 40 mg by mouth daily.   Vitamin D3 25 MCG (1000 UT) Caps Take 1,000 Units by mouth daily.       No orders of the defined types were placed in this encounter.   Immunization History  Administered Date(s) Administered  . Influenza Split 12/01/2010, 03/24/2012  . Pneumococcal Conjugate-13 04/24/2014    Social History   Tobacco Use  . Smoking status: Never Smoker  . Smokeless tobacco: Never Used  Substance Use Topics  . Alcohol use: No    Review of Systems  DATA OBTAINED: from patient-limited; nursing- as per history of present illness GENERAL:  no fevers, fatigue, appetite changes SKIN: No itching, rash HEENT: No complaint RESPIRATORY: No cough, wheezing, SOB CARDIAC: No chest pain, palpitations, lower extremity edema  GI: No abdominal pain, No N/V/D or constipation, No heartburn or reflux  GU: No dysuria, frequency or urgency, or incontinence  MUSCULOSKELETAL: No unrelieved bone/joint pain NEUROLOGIC: No headache, dizziness  PSYCHIATRIC: No overt anxiety or sadness  Vitals:   05/19/18 1756  BP: (!) 130/56  Pulse: 63  Resp: 18  Temp: (!) 97.5 F (36.4 C)   Body mass index is 19.66 kg/m. Physical Exam  GENERAL APPEARANCE: Alert, conversant, No acute distress  SKIN: No diaphoresis rash HEENT:  Unremarkable RESPIRATORY: Breathing is even, unlabored. Lung sounds are clear   CARDIOVASCULAR: Heart RRR 2/6 systolic murmur, no rubs or gallops. No peripheral edema  GASTROINTESTINAL: Abdomen is soft, non-tender, not distended w/ normal bowel sounds.  GENITOURINARY: Bladder non tender, not distended  MUSCULOSKELETAL: No abnormal joints or musculature NEUROLOGIC: Cranial nerves 2-12 grossly intact. Moves all extremities PSYCHIATRIC: Mood and affect with dementia, no behavioral issues  Patient Active Problem List   Diagnosis Date Noted  . Failure to thrive in adult 12/18/2017  . Prediabetes 09/19/2017  . GERD (gastroesophageal reflux disease) 08/03/2017  . Vitamin D deficiency 08/03/2017  . Insomnia 08/03/2017  . Allergic rhinitis 08/03/2017  . Cerebral infarction (HCC)   . Stroke (HCC) 05/07/2017  . RUQ abdominal pain 09/11/2016  . Postural  dizziness with presyncope 09/11/2016  . RUQ pain 09/11/2016  . Stroke (cerebrum) (HCC) 04/30/2016  . Hyperkalemia 04/30/2016  . Acute encephalopathy 04/30/2016  . Chronic diastolic CHF (congestive heart failure) (HCC) 04/30/2016  . Multiple duodenal ulcers   . RLQ abdominal pain   . Gallstones   . Elevated liver enzymes   . Abnormal finding on GI tract imaging   . Pancreas cyst   . Choledocholithiasis 01/30/2016  . Acute kidney injury (HCC) 01/30/2016  . Non-intractable vomiting with nausea   . Bleeding 11/23/2015  . Chronic kidney disease 05/29/2012  . Skin ulcer, stage 2 (HCC) 05/29/2012  . Internal hemorrhoid, bleeding 05/29/2012  . Severe protein-calorie malnutrition (HCC) 03/24/2012  . Hyponatremia 03/23/2012  . Hypokalemia 03/23/2012  . Urinary tract infection 03/23/2012  . Physical deconditioning 03/23/2012  . CKD (chronic kidney disease) stage 3, GFR 30-59 ml/min (HCC) 12/01/2010  . Encephalopathy 12/01/2010  . CVA (cerebral vascular accident) (HCC) 11/28/2010  . ASTHMA 08/25/2008  . COLONIC POLYPS, ADENOMATOUS, HX OF  08/25/2008  . HLD (hyperlipidemia) 08/17/2008  . Microcytic hypochromic anemia 08/17/2008  . Hypertension 08/17/2008  . History of CVA (cerebrovascular accident) 08/17/2008  . DIVERTICULITIS, COLON 08/17/2008  . HYPERGLYCEMIA 08/17/2008    CMP     Component Value Date/Time   NA 142 03/07/2018   K 4.8 03/07/2018   CL 107 05/08/2017 0645   CO2 23 05/08/2017 0645   GLUCOSE 74 05/08/2017 0645   BUN 30 (A) 03/07/2018   CREATININE 1.4 (A) 03/07/2018   CREATININE 1.58 (H) 05/08/2017 0645   CALCIUM 8.8 (L) 05/08/2017 0645   PROT 6.6 05/06/2017 1730   ALBUMIN 3.3 (L) 05/06/2017 1730   AST 30 03/07/2018   ALT 24 03/07/2018   ALKPHOS 46 03/07/2018   BILITOT 0.5 05/06/2017 1730   GFRNONAA 30 (L) 05/08/2017 0645   GFRAA 35 (L) 05/08/2017 0645   Recent Labs    02/18/18 02/27/18 03/07/18  NA 139 145 142  K 4.7 4.7 4.8  BUN 35* 35* 30*  CREATININE 1.3* 1.3* 1.4*   Recent Labs    08/07/17 02/27/18 03/07/18  AST 21 25 30   ALT 23 19 24   ALKPHOS 67 45 46   Recent Labs    08/07/17 02/27/18 03/07/18  WBC 5.1 5.5 4.8  HGB 10.6* 9.0* 10.2*  HCT 32* 27* 31*  PLT 177 176 178   Recent Labs    08/07/17 02/27/18 03/07/18  CHOL 145 144 171  LDLCALC 63 70 82  TRIG 76 48 112   No results found for: [facility] Lab Results  Component Value Date   TSH 2.48 03/07/2018   Lab Results  Component Value Date   HGBA1C 5.4 03/07/2018   Lab Results  Component Value Date   CHOL 171 03/07/2018   HDL 66 03/07/2018   LDLCALC 82 03/07/2018   TRIG 112 03/07/2018   CHOLHDL 3.2 05/07/2017    Significant Diagnostic Results in last 30 days:  No results found.  Assessment and Plan  Hypertension/low blood pressure- I reviewed a number of weeks of patient's vital signs, she had 2 blood pressure readings with systolic blood pressures in the high 90s, otherwise her blood pressures have been normal; there are no noted symptoms with these blood pressure readings; no intervention at this time,  continue to monitor    Merrilee SeashoreAnne , MD

## 2018-05-20 ENCOUNTER — Non-Acute Institutional Stay (SKILLED_NURSING_FACILITY): Payer: Medicare Other | Admitting: Internal Medicine

## 2018-05-20 ENCOUNTER — Encounter: Payer: Self-pay | Admitting: Internal Medicine

## 2018-05-20 DIAGNOSIS — Z710 Person encountering health services to consult on behalf of another person: Secondary | ICD-10-CM

## 2018-05-20 DIAGNOSIS — R627 Adult failure to thrive: Secondary | ICD-10-CM | POA: Diagnosis not present

## 2018-05-20 DIAGNOSIS — I5032 Chronic diastolic (congestive) heart failure: Secondary | ICD-10-CM

## 2018-05-20 DIAGNOSIS — Z8673 Personal history of transient ischemic attack (TIA), and cerebral infarction without residual deficits: Secondary | ICD-10-CM | POA: Diagnosis not present

## 2018-05-20 NOTE — Progress Notes (Signed)
: Provider:  Margit Hanks, MD Location:  [facility] Room Number: 305D Place of Service:  SNF ([phone])  PCP: Margit Hanks, MD Patient Care Team: Margit Hanks, MD as PCP - General (Internal Medicine)  Extended Emergency Contact Information Primary Emergency Contact: Torre,[name] Address: [address]          [place], [place] [address] [name] Phone: [phone] Relation: Daughter Secondary Emergency Contact: [name] [place] Home Phone: [phone] Relation: Grandson     Allergies: Aspirin and Aspirin  Chief Complaint  Patient presents with  . Acute Visit    Care Plan Meeting    HPI: Patient is 83 y.o. female who is being seen for care plan meeting with patient's daughter in a conference call.  Past Medical History:  Diagnosis Date  . Arthritis    hands  . Asthma   . Chronic diastolic CHF (congestive heart failure) (HCC)   . Chronic kidney disease (CKD), stage III (moderate) (HCC)   . Dementia (HCC)   . Depression   . Diabetes mellitus without complication (HCC)   . Diverticulosis   . Hx of adenomatous colonic polyps   . Hyperlipidemia   . Hypertension   . Hypochromic anemia   . Internal hemorrhoids   . Stroke [facility]) May 2009   multiple, last one Nov 2012, 2 strokes with right sided deficits and slurred speech  . Stroke [facility])     Past Surgical History:  Procedure Laterality Date  . ABDOMINAL HYSTERECTOMY    . ABDOMINAL SURGERY    . BOWEL RESECTION    . CHOLECYSTECTOMY N/A 09/15/2016   Procedure: LAPAROSCOPIC CHOLECYSTECTOMY WITH INTRAOPERATIVE CHOLANGIOGRAM;  Surgeon: Ovidio Kin, MD;  Location: [facility] ORS;  Service: General;  Laterality: N/A;  . COLECTOMY    . ESOPHAGOGASTRODUODENOSCOPY N/A 02/01/2016   Procedure: ESOPHAGOGASTRODUODENOSCOPY (EGD);  Surgeon: Beverley Fiedler, MD;  Location: [facility] ENDOSCOPY;  Service: Endoscopy;  Laterality: N/A;  .  FLEXIBLE SIGMOIDOSCOPY N/A 05/28/2012   Procedure: FLEXIBLE SIGMOIDOSCOPY;  Surgeon: Hart Carwin, MD;  Location: [facility] ENDOSCOPY;  Service: Endoscopy;  Laterality: N/A;  . OTHER SURGICAL HISTORY     2 ear surgeries  . PERIPHERAL VASCULAR CATHETERIZATION N/A 07/21/2014   Procedure: Abdominal Aortogram w/Lower Extremity;  Surgeon: Nada Libman, MD;  Location: [facility] INVASIVE CV LAB;  Service: Cardiovascular;  Laterality: N/A;  . PERIPHERAL VASCULAR CATHETERIZATION N/A 11/23/2015   Procedure: Abdominal Aortogram w/Lower Extremity;  Surgeon: Nada Libman, MD;  Location: [facility] INVASIVE CV LAB;  Service: Cardiovascular;  Laterality: N/A;  . PERIPHERAL VASCULAR CATHETERIZATION  11/23/2015   Procedure: Peripheral Vascular Intervention;  Surgeon: Nada Libman, MD;  Location: [facility] INVASIVE CV LAB;  Service: Cardiovascular;;  Failed PVI of AT and Peroneal on left side  . SUBTOTAL COLECTOMY  2010   for stricturing from bouts of diverticulitis along with hx recurrent diverticular bleeds.  the surgery was a total abdominal colec  . TONSILLECTOMY    . VASCULAR SURGERY      Allergies as of 05/20/2018      Reactions   Aspirin Nausea Only   Aspirin Nausea And Vomiting   Hurts stomach      Medication List       Accurate as of May 20, 2018  3:36 PM. Always use your most recent med list.        acetaminophen 325  MG tablet Commonly known as:  TYLENOL Take 650 mg by mouth every 4 (four) hours as needed for moderate pain.   atorvastatin 80 MG tablet Commonly known as:  LIPITOR Take 1 tablet (80 mg total) by mouth daily at 6 PM.   busPIRone 5 MG tablet Commonly known as:  BUSPAR Take 5 mg by mouth 2 (two) times daily.   clopidogrel 75 MG tablet Commonly known as:  PLAVIX Take 75 mg by mouth daily.   cycloSPORINE 0.05 % ophthalmic emulsion Commonly known as:  RESTASIS Place 1 drop into both eyes 2 (two) times daily.   divalproex 125 MG capsule Commonly known as:  DEPAKOTE SPRINKLE Take by  mouth. TAKE 1 CAPSULE BY MOUTH ONCE DAILY FOR MOOD DISORDER (DO NOT CRUSH)   famotidine 20 MG tablet Commonly known as:  PEPCID Take 20 mg by mouth at bedtime.   Glucerna Liqd Take 237 mLs by mouth 4 (four) times daily.   NUTRITIONAL SUPPLEMENT PO Take by mouth. Magic Cup with all meals   HYDROcodone-acetaminophen 5-325 MG tablet Commonly known as:  Norco Take 0.5 tablets by mouth 2 (two) times daily. GIVE HALF TABLET BY MOUTH TWICE DAILY FOR ARTHRITIS PAIN   loratadine 10 MG tablet Commonly known as:  CLARITIN Take 10 mg by mouth daily.   Melatonin 5 MG Tabs Take 1 tablet by mouth at bedtime.   METOPROLOL TARTRATE PO Take 25 mg by mouth. TAKE 1/2 TABLET (12.5MG ) BY MOUTH TWICE A DAY FOR TACHYCARDIA   pantoprazole 40 MG tablet Commonly known as:  PROTONIX Take 40 mg by mouth daily.   Vitamin D3 25 MCG (1000 UT) Caps Take 1,000 Units by mouth daily.       No orders of the defined types were placed in this encounter.   Immunization History  Administered Date(s) Administered  . Influenza Split 12/01/2010, 03/24/2012  . Pneumococcal Conjugate-13 04/24/2014    Social History   Tobacco Use  . Smoking status: Never Smoker  . Smokeless tobacco: Never Used  Substance Use Topics  . Alcohol use: No    Family history is   Family History  Problem Relation Age of Onset  . Diabetes Paternal Aunt   . Stroke Father   . Heart disease Father   . Diabetes Father   . Depression Father   . HIV Brother   . Prostate cancer Other   . Cancer Sister        unknown type  . Colon cancer Neg Hx       Review of Systems  DATA OBTAINED: from patient-limited, nurse GENERAL:  no fevers, fatigue, appetite changes SKIN: No itching, or rash EYES: No eye pain, redness, discharge EARS: No earache, tinnitus, change in hearing NOSE: No congestion, drainage or bleeding  MOUTH/THROAT: No mouth or tooth pain, No sore throat RESPIRATORY: No cough, wheezing, SOB CARDIAC: No chest  pain, palpitations, lower extremity edema  GI: No abdominal pain, No N/V/D or constipation, No heartburn or reflux  GU: No dysuria, frequency or urgency, or incontinence  MUSCULOSKELETAL: No unrelieved bone/joint pain NEUROLOGIC: No headache, dizziness or focal weakness PSYCHIATRIC: No c/o anxiety or sadness   Vitals:   05/20/18 1523  BP: 140/68  Pulse: (!) 59  Resp: 16  Temp: (!) 97 F (36.1 C)  SpO2: 96%    SpO2 Readings from Last 1 Encounters:  05/20/18 96%   Body mass index is 19.66 kg/m.     Physical Exam  GENERAL APPEARANCE: Alert, conversant,  No acute  distress.  SKIN: No diaphoresis rash HEAD: Normocephalic, atraumatic  EYES: Conjunctiva/lids clear. Pupils round, reactive. EOMs intact.  EARS: External exam WNL, canals clear. Hearing grossly normal.  NOSE: No deformity or discharge.  MOUTH/THROAT: Lips w/o lesions  RESPIRATORY: Breathing is even, unlabored. Lung sounds are clear   CARDIOVASCULAR: Heart RRR 2/6 systolic murmur, no  rubs or gallops. No peripheral edema.   GASTROINTESTINAL: Abdomen is soft, non-tender, not distended w/ normal bowel sounds. GENITOURINARY: Bladder non tender, not distended  MUSCULOSKELETAL: No abnormal joints or musculature NEUROLOGIC:  Cranial nerves 2-12 grossly intact. Moves all extremities  PSYCHIATRIC: Mood and affect dementia, no behavioral issues  Patient Active Problem List   Diagnosis Date Noted  . Failure to thrive in adult 12/18/2017  . Prediabetes 09/19/2017  . GERD (gastroesophageal reflux disease) 08/03/2017  . Vitamin D deficiency 08/03/2017  . Insomnia 08/03/2017  . Allergic rhinitis 08/03/2017  . Cerebral infarction (HCC)   . Stroke (HCC) 05/07/2017  . RUQ abdominal pain 09/11/2016  . Postural dizziness with presyncope 09/11/2016  . RUQ pain 09/11/2016  . Stroke (cerebrum) (HCC) 04/30/2016  . Hyperkalemia 04/30/2016  . Acute encephalopathy 04/30/2016  . Chronic diastolic CHF (congestive heart failure) (HCC)  04/30/2016  . Multiple duodenal ulcers   . RLQ abdominal pain   . Gallstones   . Elevated liver enzymes   . Abnormal finding on GI tract imaging   . Pancreas cyst   . Choledocholithiasis 01/30/2016  . Acute kidney injury (HCC) 01/30/2016  . Non-intractable vomiting with nausea   . Bleeding 11/23/2015  . Chronic kidney disease 05/29/2012  . Skin ulcer, stage 2 (HCC) 05/29/2012  . Internal hemorrhoid, bleeding 05/29/2012  . Severe protein-calorie malnutrition (HCC) 03/24/2012  . Hyponatremia 03/23/2012  . Hypokalemia 03/23/2012  . Urinary tract infection 03/23/2012  . Physical deconditioning 03/23/2012  . CKD (chronic kidney disease) stage 3, GFR 30-59 ml/min (HCC) 12/01/2010  . Encephalopathy 12/01/2010  . CVA (cerebral vascular accident) (HCC) 11/28/2010  . ASTHMA 08/25/2008  . COLONIC POLYPS, ADENOMATOUS, HX OF 08/25/2008  . HLD (hyperlipidemia) 08/17/2008  . Microcytic hypochromic anemia 08/17/2008  . Hypertension 08/17/2008  . History of CVA (cerebrovascular accident) 08/17/2008  . DIVERTICULITIS, COLON 08/17/2008  . HYPERGLYCEMIA 08/17/2008      Labs reviewed: Basic Metabolic Panel:    Component Value Date/Time   NA 142 03/07/2018   K 4.8 03/07/2018   CL 107 05/08/2017 0645   CO2 23 05/08/2017 0645   GLUCOSE 74 05/08/2017 0645   BUN 30 (A) 03/07/2018   CREATININE 1.4 (A) 03/07/2018   CREATININE 1.58 (H) 05/08/2017 0645   CALCIUM 8.8 (L) 05/08/2017 0645   PROT 6.6 05/06/2017 1730   ALBUMIN 3.3 (L) 05/06/2017 1730   AST 30 03/07/2018   ALT 24 03/07/2018   ALKPHOS 46 03/07/2018   BILITOT 0.5 05/06/2017 1730   GFRNONAA 30 (L) 05/08/2017 0645   GFRAA 35 (L) 05/08/2017 0645    Recent Labs    02/18/18 02/27/18 03/07/18  NA 139 145 142  K 4.7 4.7 4.8  BUN 35* 35* 30*  CREATININE 1.3* 1.3* 1.4*   Liver Function Tests: Recent Labs    08/07/17 02/27/18 03/07/18  AST ALT ALKPHOS 67 45 46   No results for input(s): LIPASE, AMYLASE in  the last 8760 hours. No results for input(s): AMMONIA in the last 8760 hours. CBC: Recent Labs    08/07/17 02/27/18 03/07/18  WBC 5.1 5.5 4.8  HGB 10.6*  9.0* 10.2*  HCT 32* 27* 31*  PLT 177 176 178   Lipid Recent Labs    08/07/17 02/27/18 03/07/18  CHOL 145 144 171  HDL 66 64 66  LDLCALC 63 70 82  TRIG 76 48 112    Cardiac Enzymes: No results for input(s): CKTOTAL, CKMB, CKMBINDEX, TROPONINI in the last 8760 hours. BNP: No results for input(s): BNP in the last 8760 hours. No results found for: [facility] Lab Results  Component Value Date   HGBA1C 5.4 03/07/2018   Lab Results  Component Value Date   TSH 2.48 03/07/2018   Lab Results  Component Value Date   VITAMINB12 573 08/07/2017   Lab Results  Component Value Date   FOLATE 12.7 05/28/2012   Lab Results  Component Value Date   IRON 63 08/07/2017   TIBC 195 08/07/2017   FERRITIN 470.70 08/07/2017    Imaging and Procedures obtained prior to SNF admission: Dg Op Swallowing Func-medicare/speech Path  Result Date: 05/21/2017 CLINICAL DATA:  83 year old female with history of dysphagia. EXAM: MODIFIED BARIUM SWALLOW TECHNIQUE: Different consistencies of barium were administered orally to the patient by the Speech Pathologist. Imaging of the pharynx was performed in the lateral projection. FLUOROSCOPY TIME:  Fluoroscopy Time:  58 seconds COMPARISON:  None. FINDINGS: Thin liquid-Delayed oral transit, premature spill over and delayed swallow trigger. Otherwise, within normal limits. Pure- Delayed oral transit, premature spill over and delayed swallow trigger. Otherwise, within normal limits. Pure with cracker- Delayed oral transit, premature spill over and delayed swallow trigger. Otherwise, within normal limits. IMPRESSION: Abnormal swallow study, as above. No penetration or aspiration noted. Please refer to the Speech Pathologists report for complete details and recommendations. Electronically Signed   By: Trudie Reedaniel  Entrikin  M.D.   On: 05/21/2017 13:18     Not all labs, radiology exams or other studies done during hospitalization come through on my EPIC note; however they are reviewed by me.    Assessment and Plan  Failure to thrive/History of CVA/chronic diastolic congestive heart failure/encounter for family conference- in attendance was activities, dietary, Child psychotherapistsocial worker, MDS and myself; my part was a discussion about what the nursing home is doing to protect patients in the COVID environment, and we also discussed the fact the patient is a DNR and in the event that she should have COVID family does not expect her to go to the hospital and wishes for her to be treated in the nursing home and with comfort care if necessary; patient's daughter expressed understanding  Time spent greater than 25 minutes Thurston HoleAnne D. Lyn HollingsheadAlexander, MD

## 2018-05-21 ENCOUNTER — Encounter: Payer: Self-pay | Admitting: Internal Medicine

## 2018-05-30 ENCOUNTER — Other Ambulatory Visit (HOSPITAL_COMMUNITY): Payer: Self-pay | Admitting: *Deleted

## 2018-05-30 DIAGNOSIS — R1310 Dysphagia, unspecified: Secondary | ICD-10-CM

## 2018-06-11 ENCOUNTER — Encounter: Payer: Self-pay | Admitting: Internal Medicine

## 2018-06-11 ENCOUNTER — Non-Acute Institutional Stay (SKILLED_NURSING_FACILITY): Payer: Medicare Other | Admitting: Internal Medicine

## 2018-06-11 DIAGNOSIS — R4702 Dysphasia: Secondary | ICD-10-CM | POA: Diagnosis not present

## 2018-06-11 DIAGNOSIS — E785 Hyperlipidemia, unspecified: Secondary | ICD-10-CM | POA: Diagnosis not present

## 2018-06-11 DIAGNOSIS — N183 Chronic kidney disease, stage 3 unspecified: Secondary | ICD-10-CM

## 2018-06-11 NOTE — Progress Notes (Signed)
[facility] Room Number: 305-D Place of Service:  SNF (31)  Margit HanksAlexander, Sagan Wurzel D, MD  Patient Care Team: Margit HanksAlexander, Delle Andrzejewski D, MD as PCP - General (Internal Medicine)  Extended Emergency Contact Information Primary Emergency Contact: Distel,[name] Address: [address]          [address]          [place], [place] [address] [place] Phone: [phone] Relation: Daughter Secondary Emergency Contact: [name],[name]  [place] Home Phone: [phone] Relation: Grandson    Allergies: Aspirin and Aspirin  Chief Complaint  Patient presents with  . Medical Management of Chronic Issues    Routine [facility] visit    HPI: Patient is an 83 y.o. female who is being seen for routine issues of chronic kidney disease stage III, hyperlipidemia, and dysphasia.  Past Medical History:  Diagnosis Date  . Arthritis    hands  . Asthma   . Chronic diastolic CHF (congestive heart failure) (HCC)   . Chronic kidney disease (CKD), stage III (moderate) (HCC)   . Dementia (HCC)   . Depression   . Diabetes mellitus without complication (HCC)   . Diverticulosis   . Hx of adenomatous colonic polyps   . Hyperlipidemia   . Hypertension   . Hypochromic anemia   . Internal hemorrhoids   . Stroke [facility](HCC) May 2009   multiple, last one Nov 2012, 2 strokes with right sided deficits and slurred speech    Past Surgical History:  Procedure Laterality Date  . ABDOMINAL HYSTERECTOMY    . ABDOMINAL SURGERY    . BOWEL RESECTION    . CHOLECYSTECTOMY N/A 09/15/2016   Procedure: LAPAROSCOPIC CHOLECYSTECTOMY WITH INTRAOPERATIVE CHOLANGIOGRAM;  Surgeon: [name], [name], MD;  Location: [facility] ORS;  Service: General;  Laterality: N/A;  . COLECTOMY    . ESOPHAGOGASTRODUODENOSCOPY N/A 02/01/2016   Procedure: ESOPHAGOGASTRODUODENOSCOPY (EGD);  Surgeon: [name], MD;  [facility];  Service: Endoscopy;  Laterality: N/A;  .  FLEXIBLE SIGMOIDOSCOPY N/A 05/28/2012   Procedure: FLEXIBLE SIGMOIDOSCOPY;  Surgeon: [name], MD;  [facility];  Service: Endoscopy;  Laterality: N/A;  . OTHER SURGICAL HISTORY     2 ear surgeries  . PERIPHERAL VASCULAR CATHETERIZATION N/A 07/21/2014   Procedure: Abdominal Aortogram w/Lower Extremity;  Surgeon: [name], MD;  [facility]: [facility] INVASIVE CV LAB;  Service: Cardiovascular;  Laterality: N/A;  . PERIPHERAL VASCULAR CATHETERIZATION N/A 11/23/2015   Procedure: Abdominal Aortogram w/Lower Extremity;  Surgeon: [name], MD;  Location: [facility] INVASIVE CV LAB;  Service: Cardiovascular;  Laterality: N/A;  . PERIPHERAL VASCULAR CATHETERIZATION  11/23/2015   Procedure: Peripheral Vascular Intervention;  Surgeon: [name], MD;  [facility]: [facility] INVASIVE CV LAB;  Service: Cardiovascular;;  Failed PVI of AT and Peroneal on left side  . SUBTOTAL COLECTOMY  2010   for stricturing from bouts of diverticulitis along with hx recurrent diverticular bleeds.  the surgery was a total abdominal colec  . TONSILLECTOMY    . VASCULAR SURGERY      Allergies as of 06/11/2018      Reactions   Aspirin Nausea Only   Aspirin Nausea And Vomiting   Hurts stomach      Medication List       Accurate as of Jun 11, 2018 11:59 PM. If you have any questions, ask your nurse or doctor.        acetaminophen 325 MG tablet Commonly known  as:  TYLENOL Take 650 mg by mouth 3 (three) times daily.   atorvastatin 80 MG tablet Commonly known as:  LIPITOR Take 1 tablet (80 mg total) by mouth daily at 6 PM.   busPIRone 5 MG tablet Commonly known as:  BUSPAR Take 5 mg by mouth 2 (two) times daily.   clopidogrel 75 MG tablet Commonly known as:  PLAVIX Take 75 mg by mouth daily.   cycloSPORINE 0.05 % ophthalmic emulsion Commonly known as:  RESTASIS Place 1 drop into both eyes 2 (two) times daily.   divalproex 125 MG capsule Commonly known as:  DEPAKOTE SPRINKLE Take by mouth. TAKE 1  CAPSULE BY MOUTH ONCE DAILY FOR MOOD DISORDER (DO NOT CRUSH)   famotidine 20 MG tablet Commonly known as:  PEPCID Take 20 mg by mouth at bedtime.   Glucerna Liqd Take 237 mLs by mouth 4 (four) times daily.   NUTRITIONAL SUPPLEMENT PO Take 1 each by mouth 3 (three) times daily. Magic Cup   HYDROcodone-acetaminophen 5-325 MG tablet Commonly known as:  Norco Take 0.5 tablets by mouth 2 (two) times daily. GIVE HALF TABLET BY MOUTH TWICE DAILY FOR ARTHRITIS PAIN   loratadine 10 MG tablet Commonly known as:  CLARITIN Take 10 mg by mouth daily.   Melatonin 5 MG Tabs Take 1 tablet by mouth at bedtime.   METOPROLOL TARTRATE PO Take 25 mg by mouth. TAKE 1/2 TABLET (12.5MG ) BY MOUTH TWICE A DAY FOR TACHYCARDIA   pantoprazole 40 MG tablet Commonly known as:  PROTONIX Take 40 mg by mouth daily.   Vitamin D3 25 MCG (1000 UT) Caps Take 1,000 Units by mouth daily.       No orders of the defined types were placed in this encounter.   Immunization History  Administered Date(s) Administered  . Influenza Split 12/01/2010, 03/24/2012  . Influenza-Unspecified 11/14/2017  . Pneumococcal Conjugate-13 04/24/2014    Social History   Tobacco Use  . Smoking status: Never Smoker  . Smokeless tobacco: Never Used  Substance Use Topics  . Alcohol use: No    Review of Systems  DATA OBTAINED: from patient, nurse GENERAL:  no fevers, fatigue, appetite changes SKIN: No itching, rash HEENT: No complaint RESPIRATORY: No cough, wheezing, SOB CARDIAC: No chest pain, palpitations, lower extremity edema  GI: No abdominal pain, No N/V/D or constipation, No heartburn or reflux ; severe dysphasia GU: No dysuria, frequency or urgency, or incontinence  MUSCULOSKELETAL: No unrelieved bone/joint pain NEUROLOGIC: No headache, dizziness  PSYCHIATRIC: No overt anxiety or sadness  Vitals:   06/11/18 0928  BP: 98/66  Pulse: 70  Resp: 18  Temp: 98 F (36.7 C)  SpO2: 94%   Body mass index is  18.63 kg/m. Physical Exam  GENERAL APPEARANCE: Alert, moderately conversant, No acute distress; appears miserable SKIN: No diaphoresis rash HEENT: Unremarkable RESPIRATORY: Breathing is even, unlabored. Lung sounds are clear   CARDIOVASCULAR: Heart RRR no murmurs, rubs or gallops. No peripheral edema  GASTROINTESTINAL: Abdomen is soft, non-tender, not distended w/ normal bowel sounds.  GENITOURINARY: Bladder non tender, not distended  MUSCULOSKELETAL: No abnormal joints or musculature except for thin NEUROLOGIC: Cranial nerves 2-12 grossly intact. Moves all extremities PSYCHIATRIC: Mood and affect appropriate to situation, no behavioral issues  Patient Active Problem List   Diagnosis Date Noted  . Dysphasia 06/14/2018  . Sepsis (HCC) 06/20/18  . Failure to thrive in adult 12/18/2017  . Prediabetes 09/19/2017  . GERD (gastroesophageal reflux disease) 08/03/2017  . Vitamin D deficiency 08/03/2017  .  Insomnia 08/03/2017  . Allergic rhinitis 08/03/2017  . Cerebral infarction (HCC)   . Stroke (HCC) 05/07/2017  . RUQ abdominal pain 09/11/2016  . Postural dizziness with presyncope 09/11/2016  . RUQ pain 09/11/2016  . Stroke (cerebrum) (HCC) 04/30/2016  . Hyperkalemia 04/30/2016  . Acute encephalopathy 04/30/2016  . Chronic diastolic CHF (congestive heart failure) (HCC) 04/30/2016  . Multiple duodenal ulcers   . RLQ abdominal pain   . Gallstones   . Elevated liver enzymes   . Abnormal finding on GI tract imaging   . Pancreas cyst   . Choledocholithiasis 01/30/2016  . Acute kidney injury (HCC) 01/30/2016  . Non-intractable vomiting with nausea   . Bleeding 11/23/2015  . Chronic kidney disease 05/29/2012  . Skin ulcer, stage 2 (HCC) 05/29/2012  . Internal hemorrhoid, bleeding 05/29/2012  . Severe protein-calorie malnutrition (HCC) 03/24/2012  . Hyponatremia 03/23/2012  . Hypokalemia 03/23/2012  . Urinary tract infection 03/23/2012  . Physical deconditioning 03/23/2012  .  CKD (chronic kidney disease) stage 3, GFR 30-59 ml/min (HCC) 12/01/2010  . Encephalopathy 12/01/2010  . CVA (cerebral vascular accident) (HCC) 11/28/2010  . ASTHMA 08/25/2008  . COLONIC POLYPS, ADENOMATOUS, HX OF 08/25/2008  . HLD (hyperlipidemia) 08/17/2008  . Microcytic hypochromic anemia 08/17/2008  . Hypertension 08/17/2008  . History of CVA (cerebrovascular accident) 08/17/2008  . DIVERTICULITIS, COLON 08/17/2008  . HYPERGLYCEMIA 08/17/2008    CMP     Component Value Date/Time   NA 142 03/07/2018   K 4.8 03/07/2018   CL 107 05/08/2017 0645   CO2 23 05/08/2017 0645   GLUCOSE 74 05/08/2017 0645   BUN 30 (A) 03/07/2018   CREATININE 1.4 (A) 03/07/2018   CREATININE 1.58 (H) 05/08/2017 0645   CALCIUM 8.8 (L) 05/08/2017 0645   PROT 6.6 05/06/2017 1730   ALBUMIN 3.3 (L) 05/06/2017 1730   AST 30 03/07/2018   ALT 24 03/07/2018   ALKPHOS 46 03/07/2018   BILITOT 0.5 05/06/2017 1730   GFRNONAA 30 (L) 05/08/2017 0645   GFRAA 35 (L) 05/08/2017 0645   Recent Labs    02/18/18 02/27/18 03/07/18  NA 139 145 142  K 4.7 4.7 4.8  BUN 35* 35* 30*  CREATININE 1.3* 1.3* 1.4*   Recent Labs    08/07/17 02/27/18 03/07/18  AST 21 25 30   ALT 23 19 24   ALKPHOS 67 45 46   Recent Labs    08/07/17 02/27/18 03/07/18  WBC 5.1 5.5 4.8  HGB 10.6* 9.0* 10.2*  HCT 32* 27* 31*  PLT 177 176 178   Recent Labs    08/07/17 02/27/18 03/07/18  CHOL 145 144 171  LDLCALC 63 70 82  TRIG 76 48 112   No results found for: [facility] Lab Results  Component Value Date   TSH 2.48 03/07/2018   Lab Results  Component Value Date   HGBA1C 5.4 03/07/2018   Lab Results  Component Value Date   CHOL 171 03/07/2018   HDL 66 03/07/2018   LDLCALC 82 03/07/2018   TRIG 112 03/07/2018   CHOLHDL 3.2 05/07/2017    Significant Diagnostic Results in last 30 days:  No results found.  Assessment and Plan  CKD (chronic kidney disease) stage 3, GFR 30-59 ml/min (HCC) Patient continues with stable  creatinine 1.4 which is improved from prior which is probably higher now secondary to her poor p.o. intake; have a care plan set up with family in several days and will discuss then.labs  HLD (hyperlipidemia) Good control; continue Lipitor 80 mg daily  Dysphasia Patient is having swallowing issues, and that no food or drink is safe for her and she is afraid to eat, but she is hungry; patient is set up for a study in June-earliest available-and we are set up for family meeting in several days to discuss long-term goals     Randon Goldsmith. [name], MD

## 2018-06-12 ENCOUNTER — Other Ambulatory Visit: Payer: Self-pay | Admitting: Internal Medicine

## 2018-06-12 MED ORDER — HYDROCODONE-ACETAMINOPHEN 5-325 MG PO TABS: 0.5000 | ORAL_TABLET | Freq: Two times a day (BID) | ORAL | 0 refills | Status: AC

## 2018-06-13 ENCOUNTER — Inpatient Hospital Stay (HOSPITAL_COMMUNITY)
Admission: EM | Admit: 2018-06-13 | Discharge: 2018-06-24 | DRG: 871 | Disposition: E | Payer: Medicare Other | Source: Skilled Nursing Facility | Attending: Internal Medicine | Admitting: Internal Medicine

## 2018-06-13 ENCOUNTER — Other Ambulatory Visit: Payer: Self-pay

## 2018-06-13 DIAGNOSIS — I5032 Chronic diastolic (congestive) heart failure: Secondary | ICD-10-CM | POA: Diagnosis present

## 2018-06-13 DIAGNOSIS — N183 Chronic kidney disease, stage 3 (moderate): Secondary | ICD-10-CM | POA: Diagnosis present

## 2018-06-13 DIAGNOSIS — F329 Major depressive disorder, single episode, unspecified: Secondary | ICD-10-CM | POA: Diagnosis present

## 2018-06-13 DIAGNOSIS — Z833 Family history of diabetes mellitus: Secondary | ICD-10-CM | POA: Diagnosis not present

## 2018-06-13 DIAGNOSIS — R402142 Coma scale, eyes open, spontaneous, at arrival to emergency department: Secondary | ICD-10-CM | POA: Diagnosis present

## 2018-06-13 DIAGNOSIS — R402212 Coma scale, best verbal response, none, at arrival to emergency department: Secondary | ICD-10-CM | POA: Diagnosis present

## 2018-06-13 DIAGNOSIS — I69951 Hemiplegia and hemiparesis following unspecified cerebrovascular disease affecting right dominant side: Secondary | ICD-10-CM

## 2018-06-13 DIAGNOSIS — Z66 Do not resuscitate: Secondary | ICD-10-CM | POA: Diagnosis present

## 2018-06-13 DIAGNOSIS — Z20828 Contact with and (suspected) exposure to other viral communicable diseases: Secondary | ICD-10-CM | POA: Diagnosis present

## 2018-06-13 DIAGNOSIS — E1122 Type 2 diabetes mellitus with diabetic chronic kidney disease: Secondary | ICD-10-CM | POA: Diagnosis present

## 2018-06-13 DIAGNOSIS — F0390 Unspecified dementia without behavioral disturbance: Secondary | ICD-10-CM | POA: Diagnosis present

## 2018-06-13 DIAGNOSIS — A419 Sepsis, unspecified organism: Secondary | ICD-10-CM | POA: Diagnosis present

## 2018-06-13 DIAGNOSIS — M1990 Unspecified osteoarthritis, unspecified site: Secondary | ICD-10-CM | POA: Diagnosis present

## 2018-06-13 DIAGNOSIS — R402342 Coma scale, best motor response, flexion withdrawal, at arrival to emergency department: Secondary | ICD-10-CM | POA: Diagnosis present

## 2018-06-13 DIAGNOSIS — Z823 Family history of stroke: Secondary | ICD-10-CM

## 2018-06-13 DIAGNOSIS — I471 Supraventricular tachycardia: Secondary | ICD-10-CM | POA: Diagnosis present

## 2018-06-13 DIAGNOSIS — I130 Hypertensive heart and chronic kidney disease with heart failure and stage 1 through stage 4 chronic kidney disease, or unspecified chronic kidney disease: Secondary | ICD-10-CM | POA: Diagnosis present

## 2018-06-13 DIAGNOSIS — Z886 Allergy status to analgesic agent status: Secondary | ICD-10-CM | POA: Diagnosis not present

## 2018-06-13 DIAGNOSIS — Z7902 Long term (current) use of antithrombotics/antiplatelets: Secondary | ICD-10-CM | POA: Diagnosis not present

## 2018-06-13 DIAGNOSIS — E785 Hyperlipidemia, unspecified: Secondary | ICD-10-CM | POA: Diagnosis present

## 2018-06-13 DIAGNOSIS — I69351 Hemiplegia and hemiparesis following cerebral infarction affecting right dominant side: Secondary | ICD-10-CM | POA: Diagnosis not present

## 2018-06-13 DIAGNOSIS — Z515 Encounter for palliative care: Secondary | ICD-10-CM | POA: Diagnosis present

## 2018-06-13 DIAGNOSIS — Z8249 Family history of ischemic heart disease and other diseases of the circulatory system: Secondary | ICD-10-CM | POA: Diagnosis not present

## 2018-06-13 DIAGNOSIS — R4182 Altered mental status, unspecified: Secondary | ICD-10-CM

## 2018-06-13 LAB — CBG MONITORING, ED: Glucose-Capillary: 159 mg/dL — ABNORMAL HIGH (ref 70–99)

## 2018-06-13 LAB — SARS CORONAVIRUS 2 BY RT PCR (HOSPITAL ORDER, PERFORMED IN ~~LOC~~ HOSPITAL LAB): SARS Coronavirus 2: NEGATIVE

## 2018-06-13 MED ORDER — GLYCOPYRROLATE 0.2 MG/ML IJ SOLN: 0.2000 mg | INTRAMUSCULAR | Status: DC | PRN

## 2018-06-13 MED ORDER — MORPHINE 100MG IN NS 100ML (1MG/ML) PREMIX INFUSION
1.0000 mg/h | INTRAVENOUS | Status: DC
  Administered 2018-06-13: 1 mg/h via INTRAVENOUS
  Filled 2018-06-13: qty 100

## 2018-06-13 MED ORDER — MORPHINE SULFATE (PF) 4 MG/ML IV SOLN: 4.0000 mg | INTRAVENOUS | Status: DC | PRN

## 2018-06-13 MED ORDER — BIOTENE DRY MOUTH MT LIQD: 15.0000 mL | OROMUCOSAL | Status: DC | PRN

## 2018-06-13 MED ORDER — MORPHINE BOLUS VIA INFUSION
1.0000 mg | INTRAVENOUS | Status: DC | PRN
  Filled 2018-06-13: qty 1

## 2018-06-13 MED ORDER — ACETAMINOPHEN 650 MG RE SUPP: 650.0000 mg | Freq: Four times a day (QID) | RECTAL | Status: DC | PRN

## 2018-06-13 MED ORDER — GLYCOPYRROLATE 1 MG PO TABS: 1.0000 mg | ORAL_TABLET | ORAL | Status: DC | PRN

## 2018-06-13 MED ORDER — POLYVINYL ALCOHOL 1.4 % OP SOLN
1.0000 [drp] | Freq: Four times a day (QID) | OPHTHALMIC | Status: DC | PRN
  Filled 2018-06-13: qty 15

## 2018-06-14 ENCOUNTER — Encounter: Payer: Self-pay | Admitting: Internal Medicine

## 2018-06-14 DIAGNOSIS — R4702 Dysphasia: Secondary | ICD-10-CM | POA: Insufficient documentation

## 2018-06-14 NOTE — Assessment & Plan Note (Signed)
Good control; continue Lipitor 80 mg daily 

## 2018-06-14 NOTE — Assessment & Plan Note (Signed)
Patient continues with stable creatinine 1.4 which is improved from prior which is probably higher now secondary to her poor p.o. intake; have a care plan set up with family in several days and will discuss then.labs

## 2018-06-14 NOTE — Assessment & Plan Note (Signed)
Patient is having swallowing issues, and that no food or drink is safe for her and she is afraid to eat, but she is hungry; patient is set up for a study in June-earliest available-and we are set up for family meeting in several days to discuss long-term goals

## 2018-06-24 NOTE — Progress Notes (Signed)
Patient's Morphine soln. 75 mls.wasted with nurse Christella Noa Rn

## 2018-06-24 NOTE — Death Summary Note (Signed)
  Name: [name] MRN: [id number] DOB: 1935/07/25 83 y.o.  Date of Admission: Jun 23, 2018 10:38 AM Date of Discharge: 2018-06-23 Attending Physician: Inez Catalina, MD  Discharge Diagnosis: Active Problems:   Sepsis [facility])   Cause of death: Sepsis with Unknown Source Time of death: 3:08 PM  Disposition and follow-up:   [name] was discharged from [facility] in expired condition.   Hospital Course: [name] is a 83 yo female from [facility] with history of hypertension, CVA w/right sided deficits, CKD Stage III, TIIDM, arthritis, and CHF who presented with altered mental status, fever, labored breathing, and hypotension. Patient had had decreased appetite over the past several days and decreased interaction before becoming tachypnic suddenly. She was brought to the ED for relief of her labored breathing. Family was seen at bedside, and it was confirmed that she was comfort care only and aggressive treatment would not be pursued. She started on morphine drip and placed on non-rebreather for comfort and passed away later in the day at 3:08 pm.   Signed: Guinevere Scarlet A, DO 06-23-2018, 3:00 PM

## 2018-06-24 NOTE — ED Notes (Signed)
Nurse navigator spoke with Candelaria Stagers to notify of patient plan of care and to talk with family when they arrive.

## 2018-06-24 NOTE — ED Provider Notes (Signed)
[facility] EMERGENCY DEPARTMENT Provider Note   CSN: [id number] Arrival date & time:       History   Chief Complaint Chief Complaint  Patient presents with  . Altered Mental Status    HPI [name] is a 83 y.o. female.  HPI  83 year old female with a history of chronic diastolic CHF, CKD stage III, dementia and nonambulatory at baseline, DM, HLD, HTN, and multiple previous strokes presents with altered mental status.  Per EMS, patient had not eaten for the past 3 days and was minimally responsive this morning.  Blood sugar was within normal limits on EMS arrival.  GCS 8.  Patient is DNR.  Patient hypotensive and tachycardic.  Patient unable to provide further history.  Past Medical History:  Diagnosis Date  . Arthritis    hands  . Asthma   . Chronic diastolic CHF (congestive heart failure) (HCC)   . Chronic kidney disease (CKD), stage III (moderate) (HCC)   . Dementia (HCC)   . Depression   . Diabetes mellitus without complication (HCC)   . Diverticulosis   . Hx of adenomatous colonic polyps   . Hyperlipidemia   . Hypertension   . Hypochromic anemia   . Internal hemorrhoids   . Stroke [facility](HCC) May 2009   multiple, last one Nov 2012, 2 strokes with right sided deficits and slurred speech    Patient Active Problem List   Diagnosis Date Noted  . Sepsis (HCC) Dec 10, 2018  . Failure to thrive in adult 12/18/2017  . Prediabetes 09/19/2017  . GERD (gastroesophageal reflux disease) 08/03/2017  . Vitamin D deficiency 08/03/2017  . Insomnia 08/03/2017  . Allergic rhinitis 08/03/2017  . Cerebral infarction (HCC)   . Stroke (HCC) 05/07/2017  . RUQ abdominal pain 09/11/2016  . Postural dizziness with presyncope 09/11/2016  . RUQ pain 09/11/2016  . Stroke (cerebrum) (HCC) 04/30/2016  . Hyperkalemia 04/30/2016  . Acute encephalopathy 04/30/2016  . Chronic diastolic CHF (congestive heart failure) (HCC) 04/30/2016  . Multiple duodenal ulcers   . RLQ  abdominal pain   . Gallstones   . Elevated liver enzymes   . Abnormal finding on GI tract imaging   . Pancreas cyst   . Choledocholithiasis 01/30/2016  . Acute kidney injury (HCC) 01/30/2016  . Non-intractable vomiting with nausea   . Bleeding 11/23/2015  . Chronic kidney disease 05/29/2012  . Skin ulcer, stage 2 (HCC) 05/29/2012  . Internal hemorrhoid, bleeding 05/29/2012  . Severe protein-calorie malnutrition (HCC) 03/24/2012  . Hyponatremia 03/23/2012  . Hypokalemia 03/23/2012  . Urinary tract infection 03/23/2012  . Physical deconditioning 03/23/2012  . CKD (chronic kidney disease) stage 3, GFR 30-59 ml/min (HCC) 12/01/2010  . Encephalopathy 12/01/2010  . CVA (cerebral vascular accident) (HCC) 11/28/2010  . ASTHMA 08/25/2008  . COLONIC POLYPS, ADENOMATOUS, HX OF 08/25/2008  . HLD (hyperlipidemia) 08/17/2008  . Microcytic hypochromic anemia 08/17/2008  . Hypertension 08/17/2008  . History of CVA (cerebrovascular accident) 08/17/2008  . DIVERTICULITIS, COLON 08/17/2008  . HYPERGLYCEMIA 08/17/2008    Past Surgical History:  Procedure Laterality Date  . ABDOMINAL HYSTERECTOMY    . ABDOMINAL SURGERY    . BOWEL RESECTION    . CHOLECYSTECTOMY N/A 09/15/2016   Procedure: LAPAROSCOPIC CHOLECYSTECTOMY WITH INTRAOPERATIVE CHOLANGIOGRAM;  Surgeon: [name], [name], MD;  Location: [facility];  Service: General;  Laterality: N/A;  . COLECTOMY    . ESOPHAGOGASTRODUODENOSCOPY N/A 02/01/2016   Procedure: ESOPHAGOGASTRODUODENOSCOPY (EGD);  Surgeon: [name], MD;  [facility];  Service: Endoscopy;  Laterality: N/A;  . FLEXIBLE SIGMOIDOSCOPY N/A 05/28/2012   Procedure: FLEXIBLE SIGMOIDOSCOPY;  Surgeon: Hart Carwin, MD;  Location: [facility] ENDOSCOPY;  Service: Endoscopy;  Laterality: N/A;  . OTHER SURGICAL HISTORY     2 ear surgeries  . PERIPHERAL VASCULAR CATHETERIZATION N/A 07/21/2014   Procedure: Abdominal Aortogram w/Lower Extremity;  Surgeon: Nada Libman, MD;  [facility] INVASIVE  CV LAB;  Service: Cardiovascular;  Laterality: N/A;  . PERIPHERAL VASCULAR CATHETERIZATION N/A 11/23/2015   Procedure: Abdominal Aortogram w/Lower Extremity;  Surgeon: Nada Libman, MD;  [facility]: [facility] INVASIVE CV LAB;  Service: Cardiovascular;  Laterality: N/A;  . PERIPHERAL VASCULAR CATHETERIZATION  11/23/2015   Procedure: Peripheral Vascular Intervention;  Surgeon: Nada Libman, MD;  [facility]: [facility] INVASIVE CV LAB;  Service: Cardiovascular;;  Failed PVI of AT and Peroneal on left side  . SUBTOTAL COLECTOMY  2010   for stricturing from bouts of diverticulitis along with hx recurrent diverticular bleeds.  the surgery was a total abdominal colec  . TONSILLECTOMY    . VASCULAR SURGERY       OB History   No obstetric history on file.      Home Medications    Prior to Admission medications   Medication Sig Start Date End Date Taking? Authorizing Provider  acetaminophen (TYLENOL) 325 MG tablet Take 650 mg by mouth 3 (three) times daily.     [provider]  atorvastatin (LIPITOR) 80 MG tablet Take 1 tablet (80 mg total) by mouth daily at 6 PM. 05/08/17   Hoffman, Bary Richard, DO  busPIRone (BUSPAR) 5 MG tablet Take 5 mg by mouth 2 (two) times daily.    [provider]  Cholecalciferol (VITAMIN D3) 1000 UNITS CAPS Take 1,000 Units by mouth daily.     [provider]  clopidogrel (PLAVIX) 75 MG tablet Take 75 mg by mouth daily.     [provider]  cycloSPORINE (RESTASIS) 0.05 % ophthalmic emulsion Place 1 drop into both eyes 2 (two) times daily. 10/05/17 10/06/18  [provider]  divalproex (DEPAKOTE SPRINKLE) 125 MG capsule Take by mouth. TAKE 1 CAPSULE BY MOUTH ONCE DAILY FOR MOOD DISORDER (DO NOT CRUSH)    [provider]  famotidine (PEPCID) 20 MG tablet Take 20 mg by mouth at bedtime.     [provider]  GLUCERNA (GLUCERNA) LIQD Take 237 mLs by mouth 4 (four) times daily.     [provider]   HYDROcodone-acetaminophen (NORCO) 5-325 MG tablet Take 0.5 tablets by mouth 2 (two) times daily. GIVE HALF TABLET BY MOUTH TWICE DAILY FOR ARTHRITIS PAIN 06/12/18   Margit Hanks, MD  loratadine (CLARITIN) 10 MG tablet Take 10 mg by mouth daily.     [provider]  Melatonin 5 MG TABS Take 1 tablet by mouth at bedtime.     [provider]  METOPROLOL TARTRATE PO Take 25 mg by mouth. TAKE 1/2 TABLET (12.5MG ) BY MOUTH TWICE A DAY FOR TACHYCARDIA    [provider]  Nutritional Supplements (NUTRITIONAL SUPPLEMENT PO) Take 1 each by mouth 3 (three) times daily. Magic Cup    [provider]  pantoprazole (PROTONIX) 40 MG tablet Take 40 mg by mouth daily.    [provider]    Family History Family History  Problem Relation Age of Onset  . Diabetes Paternal Aunt   . Stroke Father   . Heart disease Father   . Diabetes Father   . Depression Father   .  HIV Brother   . Prostate cancer Other   . Cancer Sister        unknown type  . Colon cancer Neg Hx     Social History Social History   Tobacco Use  . Smoking status: Never Smoker  . Smokeless tobacco: Never Used  Substance Use Topics  . Alcohol use: No  . Drug use: No     Allergies   Aspirin and Aspirin   Review of Systems Review of Systems  Unable to perform ROS: Patient nonverbal     Physical Exam Updated Vital Signs BP (!) 52/23 (BP Location: Right Arm)   Pulse (!) 134   Temp (!) 97.4 F (36.3 C) (Oral)   Resp 16   SpO2 (!) 86%   Physical Exam Vitals signs and nursing note reviewed.  Constitutional:      Appearance: She is well-developed. She is ill-appearing and toxic-appearing.  HENT:     Head: Normocephalic and atraumatic.  Eyes:     Conjunctiva/sclera: Conjunctivae normal.  Neck:     Musculoskeletal: Neck supple.  Cardiovascular:     Rate and Rhythm: Normal rate and regular rhythm.     Heart sounds: No murmur.  Pulmonary:     Effort: Tachypnea present.  No respiratory distress.     Comments: Coarse breath sounds bilaterally Normal SPO2 on nonrebreather Abdominal:     Palpations: Abdomen is soft.     Tenderness: There is no abdominal tenderness.  Musculoskeletal:     Comments: Contractures of the left upper extremity  Skin:    General: Skin is warm and dry.  Neurological:     Mental Status: She is alert.     GCS: GCS eye subscore is 4. GCS verbal subscore is 1. GCS motor subscore is 4.      ED Treatments / Results  Labs (all labs ordered are listed, but only abnormal results are displayed) Labs Reviewed  CBG MONITORING, ED - Abnormal; Notable for the following components:      Result Value   Glucose-Capillary 159 (*)    All other components within normal limits  SARS CORONAVIRUS 2 (HOSPITAL ORDER, PERFORMED IN [facility])    EKG None  Radiology No results found.  Procedures Procedures (including critical care time)  Medications Ordered in ED Medications  morphine bolus via infusion 1 mg (has no administration in time range)  morphine  in NS ( /mL) infusion - premix (has no administration in time range)     Initial Impression / Assessment and Plan / ED Course  I have reviewed the triage vital signs and the nursing notes.  Pertinent labs & imaging results that were available during my care of the patient were reviewed by me and considered in my medical decision making (see chart for details).  83 year old female with a history of chronic diastolic CHF, CKD stage III, dementia and nonambulatory at baseline, DM, HLD, HTN, and multiple previous strokes presents with altered mental status.  GCS 8.  On nonrebreather.  Tachypneic, febrile, tachycardic, and hypotensive.  I called patient's daughter, [name], who is the patient's primary decision maker.  Daughter states that she has been in the nursing home for the past year.  She has had multiple strokes and will attempt to communicate but it is  difficult to understand her. She is nonambulatory.  I explained in detail my concern that she was likely septic as she has low blood pressure, high heart rate and is also febrile.  I  discussed obtaining labs, blood cultures, CT imaging of her head, and further aggressive management including IV antibiotics and admission.  The family had already had discussions about palliative care putting the patient on hospice.  After discussing this with [name], she would like the patient to be comfortable and would not want further labs or imaging at this time.  She is aware that the patient will likely pass without intervention.  She made comfort care.  Consults made to palliative care and case management.  Patient admitted for further management.  Final Clinical Impressions(s) / ED Diagnoses   Final diagnoses:  Altered mental status, unspecified altered mental status type    ED Discharge Orders    None       Vallery Ridge, MD 06/19/2018 7829    Blane Ohara, MD Jun 19, 2018 1531

## 2018-06-24 NOTE — ED Notes (Signed)
Nurse navigator spoke with ED doctor and nurse regarding plan of comfort care. ED Doctor spoke with family and will arrive shortly for patient to be at bedside. Sort nurse and security notified.

## 2018-06-24 NOTE — ED Triage Notes (Signed)
Pt here from [facility]. Became minimally responsive this morning and has been eating less and less over the last two weeks. Per facility, they were going to have consult with family today re: palliative care consult. Hx dementia.

## 2018-06-24 NOTE — ED Notes (Signed)
ED TO INPATIENT HANDOFF REPORT  ED Nurse Name and Phone #: Areen [phone]  S Name/Age/Gender [name] 83 y.o. female Room/Bed: 018C/018C  Code Status   Code Status: DNR  Home/SNF/Other comfort care  Is this baseline? No   Triage Complete: Triage complete  Chief Complaint GCS 8  Triage Note Pt here from [facility]. Became minimally responsive this morning and has been eating less and less over the last two weeks. Per facility, they were going to have consult with family today re: palliative care consult. Hx dementia.    Allergies Allergies  Allergen Reactions  . Aspirin Nausea Only  . Aspirin Nausea And Vomiting    Hurts stomach    Level of Care/Admitting Diagnosis ED Disposition    ED Disposition Condition Comment   Admit  Hospital Area: [facility] [[id number]]  Level of Care: Med-Surg [16]  Covid Evaluation: N/A  Diagnosis: Sepsis [facility]) [[id number]]  Admitting Physician: Inez Catalina [phone]  Attending Physician: Inez Catalina [phone]  Estimated length of stay: past midnight tomorrow  Certification:: I certify this patient will need inpatient services for at least 2 midnights  Bed request comments: comfort care  PT Class (Do Not Modify): Inpatient [101]  PT Acc Code (Do Not Modify): Private [1]       B Medical/Surgery History Past Medical History:  Diagnosis Date  . Arthritis    hands  . Asthma   . Chronic diastolic CHF (congestive heart failure) (HCC)   . Chronic kidney disease (CKD), stage III (moderate) (HCC)   . Dementia (HCC)   . Depression   . Diabetes mellitus without complication (HCC)   . Diverticulosis   . Hx of adenomatous colonic polyps   . Hyperlipidemia   . Hypertension   . Hypochromic anemia   . Internal hemorrhoids   . Stroke [facility]) May 2009   multiple, last one Nov 2012, 2 strokes with right sided deficits and slurred speech   Past Surgical History:  Procedure Laterality Date  . ABDOMINAL  HYSTERECTOMY    . ABDOMINAL SURGERY    . BOWEL RESECTION    . CHOLECYSTECTOMY N/A 09/15/2016   Procedure: LAPAROSCOPIC CHOLECYSTECTOMY WITH INTRAOPERATIVE CHOLANGIOGRAM;  Surgeon: Ovidio Kin, MD;  Location: [facility]S;  Service: General;  Laterality: N/A;  . COLECTOMY    . ESOPHAGOGASTRODUODENOSCOPY N/A 02/01/2016   Procedure: ESOPHAGOGASTRODUODENOSCOPY (EGD);  Surgeon: Beverley Fiedler, MD;  Location: [facility] ENDOSCOPY;  Service: Endoscopy;  Laterality: N/A;  . FLEXIBLE SIGMOIDOSCOPY N/A 05/28/2012   Procedure: FLEXIBLE SIGMOIDOSCOPY;  Surgeon: Hart Carwin, MD;  Location: [facility] ENDOSCOPY;  Service: Endoscopy;  Laterality: N/A;  . OTHER SURGICAL HISTORY     2 ear surgeries  . PERIPHERAL VASCULAR CATHETERIZATION N/A 07/21/2014   Procedure: Abdominal Aortogram w/Lower Extremity;  Surgeon: Nada Libman, MD;  [facility] INVASIVE CV LAB;  Service: Cardiovascular;  Laterality: N/A;  . PERIPHERAL VASCULAR CATHETERIZATION N/A 11/23/2015   Procedure: Abdominal Aortogram w/Lower Extremity;  Surgeon: Nada Libman, MD;  [facility]: [facility] INVASIVE CV LAB;  Service: Cardiovascular;  Laterality: N/A;  . PERIPHERAL VASCULAR CATHETERIZATION  11/23/2015   Procedure: Peripheral Vascular Intervention;  Surgeon: Nada Libman, MD;  [facility] INVASIVE CV LAB;  Service: Cardiovascular;;  Failed PVI of AT and Peroneal on left side  . SUBTOTAL COLECTOMY  2010   for stricturing from bouts of diverticulitis along with hx recurrent diverticular bleeds.  the surgery was a total abdominal colec  . TONSILLECTOMY    .  VASCULAR SURGERY       A IV Location/Drains/Wounds Patient Lines/Drains/Airways Status   Active Line/Drains/Airways    Name:   Placement date:   Placement time:   Site:   Days:   Peripheral IV 06/15/18 Right Hand   06-15-18    1042    Hand   less than 1   Peripheral IV 2018-06-15 Left Wrist   2018-06-15    1108    Wrist   less than 1   Incision (Closed) 09/15/16 Abdomen Other (Comment)   09/15/16    1147     636    Incision - 5 Ports Abdomen Umbilicus Upper;Umbilicus Upper;Mid Right;Mid;Lateral Right;Mid   09/15/16    1030     636   Pressure Ulcer 05/26/12 Stage II -  Partial thickness loss of dermis presenting as a shallow open ulcer with a red, pink wound bed without slough. dry   05/26/12    2215     2209   Wound 03/23/12 Other (Comment) Ankle Left;Other (Comment)   03/23/12    2130    Ankle   2273   Wound 03/23/12 Other (Comment) Heel Left Left Heel Callus   03/23/12    2130    Heel   2273   Wound 03/23/12 Other (Comment) Toe (Comment  which one) Right   03/23/12    2130    Toe (Comment  which one)   2273   Wound 03/23/12 Abrasion(s) Elbow Left   03/23/12    2130    Elbow   2273          Intake/Output Last 24 hours No intake or output data in the 24 hours ending 06/15/18 1136  Labs/Imaging Results for orders placed or performed during the hospital encounter of 15-Jun-2018 (from the past 48 hour(s))  CBG monitoring, ED     Status: Abnormal   Collection Time: 06-15-18 10:50 AM  Result Value Ref Range   Glucose-Capillary 159 (H) 70 - 99 mg/dL   No results found.  Pending Labs Unresulted Labs (From admission, onward)    Start     Ordered   2018-06-15 1109  SARS Coronavirus 2 (CEPHEID- Performed in [facility] hospital lab), Hosp Order  (Symptomatic Patients Labs with Precautions )  Once,   R    Question:  Patient immune status  Answer:  Normal   06/15/2018 1108          Vitals/Pain Today's Vitals   2018-06-15 1045 06-15-18 1051 15-Jun-2018 1100 2018/06/15 1101  BP: (!) 75/42 (!) 76/50 (!) 50/39   Pulse: (!) 149 89    Resp:  (!) 27 (!) 35   Temp: (!) 96.8 F (36 C)   (!) 101.2 F (38.4 C)  TempSrc: Tympanic   Rectal  SpO2:  100%      Isolation Precautions Droplet and Contact precautions  Medications Medications  morphine 4 MG/ML injection 4 mg (has no administration in time range)    Mobility non-ambulatory High fall risk   Focused Assessments Neuro Assessment Handoff:  Swallow  screen pass? No          Neuro Assessment: Within Defined Limits Neuro Checks:      Last Documented NIHSS Modified Score:   Has TPA been given? No If patient is a Neuro Trauma and patient is going to OR before floor call report to 4N Charge nurse: [phone] or [phone]     R Recommendations: See Admitting Provider Note  Report given to:   Additional  Notes: baseline nonverbal, non ambulatory. DNR/comfort care, family on the way

## 2018-06-24 NOTE — Progress Notes (Signed)
Nutrition Brief Note  Chart reviewed. Pt now transitioning to comfort care.  No further nutrition interventions warranted at this time.  Please re-consult as needed.   Adriell Polansky A. Aretta Stetzel, RD, LDN, CDCES Registered Dietitian II Certified Diabetes Care and Education Specialist Pager: [phone] After hours Pager: [phone]  

## 2018-06-24 NOTE — Progress Notes (Signed)
Patient is a DNR, no pulse, no respirations, unresponsive, next of kin was notified, MD was also notified and [employer] also notified.

## 2018-06-24 NOTE — H&P (Signed)
Date: 04/03/18               Patient Name:  Emma NakayamaSarah M Carranza MRN: [id number]  DOB: 23-Mar-1935 Age / Sex: 83 y.o., female   PCP: Margit HanksAlexander, [name], MD         Medical Service: Internal Medicine Teaching Service         Attending Physician: Dr. Inez CatalinaMullen, Emily B, MD    First Contact: Dr. Cleaster CorinSeawell Pager: [phone]  Second Contact: Dr. Evelene CroonSantos Pager: [phone]       After Hours (After 5p/  First Contact Pager: [phone]  weekends / holidays): Second Contact Pager: [phone]   Chief Complaint: AMS  History of Present Illness:  Emma NakayamaSarah M. Donner is a 83yo female residing at [facility] with history of hypertension, CVA w/right sided deficits, CKD stage III, TIIDM, arthritis, and CHF presenting with altered mental status, labored breathing, hypotension, and fever after having decreased appetite and responsive ness over the past few days. She is comfort care. Facility states she was at her normal baseline when she begin to eat less over the past few days, and then suddenly developed tachypnea and labored breathing this morning. At bedside patient is not responding to voice or painful stimuli. Family are at bedside and state she was talkative and normal when they last spoke to her on the phone two days ago, and they do not know of any recent illness or concerns as the patient had not voiced any. The ED provider had also spoken to the family earlier, and the patient is comfort care and they do not wish to seek aggressive treatment.     Social:  Resides at [facility] Daughter and grandson at bedside.   Family History:  Family History  Problem Relation Age of Onset  . Diabetes Paternal Aunt   . Stroke Father   . Heart disease Father   . Diabetes Father   . Depression Father   . HIV Brother   . Prostate cancer Other   . Cancer Sister        unknown type  . Colon cancer Neg Hx      Meds:  No outpatient medications have been marked as taking for the 07/28/2018  encounter [facility](Hospital Encounter).     Allergies: Allergies as of 003/11/20 - Review Complete 003/11/20  Allergen Reaction Noted  . Aspirin Nausea Only 08/30/2016  . Aspirin Nausea And Vomiting    Past Medical History:  Diagnosis Date  . Arthritis    hands  . Asthma   . Chronic diastolic CHF (congestive heart failure) (HCC)   . Chronic kidney disease (CKD), stage III (moderate) (HCC)   . Dementia (HCC)   . Depression   . Diabetes mellitus without complication (HCC)   . Diverticulosis   . Hx of adenomatous colonic polyps   . Hyperlipidemia   . Hypertension   . Hypochromic anemia   . Internal hemorrhoids   . Stroke [facility](HCC) May 2009   multiple, last one Nov 2012, 2 strokes with right sided deficits and slurred speech     Review of Systems: A complete ROS was negative except as per HPI.   Physical Exam: Blood pressure (!) 50/39, pulse 89, temperature (!) 101.2 F (38.4 C), temperature source Rectal, resp. rate (!) 35, SpO2 100 %.  Constitution: appears uncomfortable, labored breathing, cachectic HENT: on non-rebreather  Cardio: RRR, no m/r/g  Respiratory: tachypnic, rales bilaterally MSK: no edema Neuro: does not  respond to verbal or physical stimuli, perrla  Skin: c/d/i, no lesions noted    EKG: personally reviewed my interpretation is SVT.   Assessment & Plan by Problem: Active Problems:   Sepsis (HCC)  [name] is a 83yo female residing at [facility] with history of hypertension, CVA w/right sided deficits, CKD stage III, TIIDM, arthritis, and CHF presenting with AMS and symptoms of sepsis. Patient is comfort care.   Sepsis Comfort Care Decreasing mental status over the past two days with sudden onset dyspnea this morning at her facility. Patient is comfort care and family does not want aggressive treatment. She does have rales bilaterally. No skin lesions or ulcerations noted.  Family updated at bedside. Discussed that she likely will  pass today. They are understanding and do not wish to speak with the chaplain currently.   - morphine gtt ordered for comfort - Droplet precautions, covid test pending   Diet: npo  VTE: none  IVF: none  Code: Comfort care  Dispo: Admit patient to Inpatient with expected length of stay greater than 2 midnights.  SignedVersie Starks, DO 07-03-18, 11:55 AM  Pager: [phone]

## 2018-06-24 DEATH — deceased

## 2018-07-03 ENCOUNTER — Encounter (HOSPITAL_COMMUNITY): Payer: Medicare (Managed Care)

## 2018-07-03 ENCOUNTER — Ambulatory Visit (HOSPITAL_COMMUNITY): Payer: Medicare (Managed Care)

## 2018-07-03 ENCOUNTER — Encounter (HOSPITAL_COMMUNITY): Payer: Self-pay
